# Patient Record
Sex: Female | Born: 1940 | Race: White | Hispanic: No | Smoking: Never smoker
Health system: Southern US, Community
[De-identification: ages and names within clinical notes are randomized; demographics above are authoritative.]

## PROBLEM LIST (undated history)

## (undated) DIAGNOSIS — G8929 Other chronic pain: Secondary | ICD-10-CM

## (undated) DIAGNOSIS — R0981 Nasal congestion: Secondary | ICD-10-CM

## (undated) DIAGNOSIS — K219 Gastro-esophageal reflux disease without esophagitis: Secondary | ICD-10-CM

## (undated) DIAGNOSIS — C55 Malignant neoplasm of uterus, part unspecified: Secondary | ICD-10-CM

## (undated) DIAGNOSIS — C50919 Malignant neoplasm of unspecified site of unspecified female breast: Secondary | ICD-10-CM

## (undated) DIAGNOSIS — Z972 Presence of dental prosthetic device (complete) (partial): Secondary | ICD-10-CM

## (undated) DIAGNOSIS — M779 Enthesopathy, unspecified: Secondary | ICD-10-CM

## (undated) DIAGNOSIS — K259 Gastric ulcer, unspecified as acute or chronic, without hemorrhage or perforation: Secondary | ICD-10-CM

## (undated) DIAGNOSIS — R51 Headache: Secondary | ICD-10-CM

## (undated) DIAGNOSIS — M549 Dorsalgia, unspecified: Secondary | ICD-10-CM

## (undated) HISTORY — PX: OTHER SURGICAL HISTORY: SHX169

## (undated) HISTORY — DX: Gastric ulcer, unspecified as acute or chronic, without hemorrhage or perforation: K25.9

## (undated) HISTORY — DX: Malignant neoplasm of uterus, part unspecified: C55

## (undated) HISTORY — DX: Dorsalgia, unspecified: M54.9

## (undated) HISTORY — DX: Gastro-esophageal reflux disease without esophagitis: K21.9

## (undated) HISTORY — PX: ABDOMINAL HYSTERECTOMY: SHX81

## (undated) HISTORY — DX: Other chronic pain: G89.29

## (undated) HISTORY — DX: Enthesopathy, unspecified: M77.9

## (undated) HISTORY — DX: Malignant neoplasm of unspecified site of unspecified female breast: C50.919

---

## 1962-06-23 HISTORY — PX: TONSILLECTOMY: SUR1361

## 1972-06-23 HISTORY — PX: OTHER SURGICAL HISTORY: SHX169

## 1996-06-23 DIAGNOSIS — C55 Malignant neoplasm of uterus, part unspecified: Secondary | ICD-10-CM

## 1996-06-23 HISTORY — PX: OTHER SURGICAL HISTORY: SHX169

## 1996-06-23 HISTORY — DX: Malignant neoplasm of uterus, part unspecified: C55

## 1999-11-22 HISTORY — PX: CHOLECYSTECTOMY: SHX55

## 2001-04-19 ENCOUNTER — Ambulatory Visit (HOSPITAL_COMMUNITY): Admission: RE | Admit: 2001-04-19 | Discharge: 2001-04-19 | Payer: Self-pay | Admitting: General Surgery

## 2001-04-21 ENCOUNTER — Encounter: Payer: Self-pay | Admitting: Obstetrics and Gynecology

## 2001-04-21 ENCOUNTER — Ambulatory Visit (HOSPITAL_COMMUNITY): Admission: RE | Admit: 2001-04-21 | Discharge: 2001-04-21 | Payer: Self-pay | Admitting: Obstetrics and Gynecology

## 2002-02-11 ENCOUNTER — Ambulatory Visit (HOSPITAL_COMMUNITY): Admission: RE | Admit: 2002-02-11 | Discharge: 2002-02-11 | Payer: Self-pay | Admitting: Obstetrics and Gynecology

## 2002-02-11 ENCOUNTER — Encounter: Payer: Self-pay | Admitting: Obstetrics and Gynecology

## 2002-04-21 ENCOUNTER — Encounter: Payer: Self-pay | Admitting: Obstetrics and Gynecology

## 2002-04-21 ENCOUNTER — Ambulatory Visit (HOSPITAL_COMMUNITY): Admission: RE | Admit: 2002-04-21 | Discharge: 2002-04-21 | Payer: Self-pay | Admitting: Obstetrics and Gynecology

## 2002-06-03 ENCOUNTER — Encounter: Payer: Self-pay | Admitting: Neurosurgery

## 2002-06-03 ENCOUNTER — Inpatient Hospital Stay (HOSPITAL_COMMUNITY): Admission: RE | Admit: 2002-06-03 | Discharge: 2002-06-09 | Payer: Self-pay | Admitting: Neurosurgery

## 2002-06-07 ENCOUNTER — Encounter: Payer: Self-pay | Admitting: Neurosurgery

## 2002-09-26 ENCOUNTER — Encounter (HOSPITAL_COMMUNITY): Admission: RE | Admit: 2002-09-26 | Discharge: 2002-10-26 | Payer: Self-pay | Admitting: Neurosurgery

## 2002-12-06 ENCOUNTER — Encounter (HOSPITAL_COMMUNITY): Admission: RE | Admit: 2002-12-06 | Discharge: 2003-01-05 | Payer: Self-pay | Admitting: Neurosurgery

## 2003-01-06 ENCOUNTER — Encounter (HOSPITAL_COMMUNITY): Admission: RE | Admit: 2003-01-06 | Discharge: 2003-02-05 | Payer: Self-pay | Admitting: Neurosurgery

## 2003-07-20 ENCOUNTER — Ambulatory Visit (HOSPITAL_COMMUNITY): Admission: RE | Admit: 2003-07-20 | Discharge: 2003-07-20 | Payer: Self-pay | Admitting: Obstetrics and Gynecology

## 2004-12-02 ENCOUNTER — Ambulatory Visit (HOSPITAL_COMMUNITY): Admission: RE | Admit: 2004-12-02 | Discharge: 2004-12-02 | Payer: Self-pay | Admitting: Obstetrics and Gynecology

## 2005-03-04 HISTORY — PX: ABSCESS DRAINAGE: SHX1119

## 2005-03-08 ENCOUNTER — Emergency Department (HOSPITAL_COMMUNITY): Admission: EM | Admit: 2005-03-08 | Discharge: 2005-03-08 | Payer: Self-pay | Admitting: Emergency Medicine

## 2005-03-09 ENCOUNTER — Inpatient Hospital Stay (HOSPITAL_COMMUNITY): Admission: EM | Admit: 2005-03-09 | Discharge: 2005-03-12 | Payer: Self-pay | Admitting: Emergency Medicine

## 2005-12-01 ENCOUNTER — Ambulatory Visit (HOSPITAL_COMMUNITY): Admission: RE | Admit: 2005-12-01 | Discharge: 2005-12-01 | Payer: Self-pay | Admitting: Internal Medicine

## 2005-12-23 ENCOUNTER — Ambulatory Visit (HOSPITAL_COMMUNITY): Admission: RE | Admit: 2005-12-23 | Discharge: 2005-12-23 | Payer: Self-pay | Admitting: Internal Medicine

## 2006-01-05 ENCOUNTER — Ambulatory Visit: Payer: Self-pay | Admitting: Gastroenterology

## 2006-01-21 ENCOUNTER — Ambulatory Visit (HOSPITAL_COMMUNITY): Admission: RE | Admit: 2006-01-21 | Discharge: 2006-01-21 | Payer: Self-pay | Admitting: Gastroenterology

## 2006-01-21 ENCOUNTER — Encounter (INDEPENDENT_AMBULATORY_CARE_PROVIDER_SITE_OTHER): Payer: Self-pay | Admitting: Specialist

## 2006-01-21 ENCOUNTER — Ambulatory Visit: Payer: Self-pay | Admitting: Gastroenterology

## 2006-01-21 DIAGNOSIS — K219 Gastro-esophageal reflux disease without esophagitis: Secondary | ICD-10-CM

## 2006-01-21 HISTORY — DX: Gastro-esophageal reflux disease without esophagitis: K21.9

## 2006-01-21 HISTORY — PX: ESOPHAGOGASTRODUODENOSCOPY: SHX1529

## 2006-02-26 ENCOUNTER — Ambulatory Visit: Payer: Self-pay | Admitting: Gastroenterology

## 2006-04-28 ENCOUNTER — Ambulatory Visit: Payer: Self-pay | Admitting: Gastroenterology

## 2007-01-18 ENCOUNTER — Ambulatory Visit (HOSPITAL_COMMUNITY): Admission: RE | Admit: 2007-01-18 | Discharge: 2007-01-18 | Payer: Self-pay | Admitting: Gynecology

## 2007-06-08 ENCOUNTER — Ambulatory Visit: Payer: Self-pay | Admitting: Gastroenterology

## 2007-07-21 ENCOUNTER — Ambulatory Visit: Payer: Self-pay | Admitting: Gastroenterology

## 2007-11-30 ENCOUNTER — Ambulatory Visit: Payer: Self-pay | Admitting: Gastroenterology

## 2008-02-16 ENCOUNTER — Ambulatory Visit (HOSPITAL_COMMUNITY): Admission: RE | Admit: 2008-02-16 | Discharge: 2008-02-16 | Payer: Self-pay | Admitting: Internal Medicine

## 2008-06-06 ENCOUNTER — Ambulatory Visit: Payer: Self-pay | Admitting: Gastroenterology

## 2009-01-09 ENCOUNTER — Other Ambulatory Visit: Admission: RE | Admit: 2009-01-09 | Discharge: 2009-01-09 | Payer: Self-pay | Admitting: Obstetrics and Gynecology

## 2009-01-23 DIAGNOSIS — K219 Gastro-esophageal reflux disease without esophagitis: Secondary | ICD-10-CM | POA: Insufficient documentation

## 2009-01-23 DIAGNOSIS — K2970 Gastritis, unspecified, without bleeding: Secondary | ICD-10-CM | POA: Insufficient documentation

## 2009-01-23 DIAGNOSIS — R197 Diarrhea, unspecified: Secondary | ICD-10-CM | POA: Insufficient documentation

## 2009-01-23 DIAGNOSIS — T7840XA Allergy, unspecified, initial encounter: Secondary | ICD-10-CM | POA: Insufficient documentation

## 2009-01-23 DIAGNOSIS — K2990 Gastroduodenitis, unspecified, without bleeding: Secondary | ICD-10-CM

## 2009-01-23 DIAGNOSIS — G43909 Migraine, unspecified, not intractable, without status migrainosus: Secondary | ICD-10-CM | POA: Insufficient documentation

## 2009-01-23 DIAGNOSIS — Z8542 Personal history of malignant neoplasm of other parts of uterus: Secondary | ICD-10-CM | POA: Insufficient documentation

## 2009-05-14 ENCOUNTER — Ambulatory Visit (HOSPITAL_COMMUNITY): Admission: RE | Admit: 2009-05-14 | Discharge: 2009-05-14 | Payer: Self-pay | Admitting: Internal Medicine

## 2009-09-17 ENCOUNTER — Telehealth (INDEPENDENT_AMBULATORY_CARE_PROVIDER_SITE_OTHER): Payer: Self-pay

## 2009-10-03 ENCOUNTER — Ambulatory Visit (HOSPITAL_COMMUNITY)
Admission: RE | Admit: 2009-10-03 | Discharge: 2009-10-03 | Payer: Self-pay | Source: Home / Self Care | Admitting: Internal Medicine

## 2010-02-22 ENCOUNTER — Emergency Department (HOSPITAL_COMMUNITY): Admission: EM | Admit: 2010-02-22 | Discharge: 2010-02-22 | Payer: Self-pay | Admitting: Emergency Medicine

## 2010-02-26 ENCOUNTER — Emergency Department (HOSPITAL_COMMUNITY): Admission: EM | Admit: 2010-02-26 | Discharge: 2010-02-26 | Payer: Self-pay | Admitting: Emergency Medicine

## 2010-05-27 ENCOUNTER — Ambulatory Visit (HOSPITAL_COMMUNITY)
Admission: RE | Admit: 2010-05-27 | Discharge: 2010-05-27 | Payer: Self-pay | Source: Home / Self Care | Admitting: Internal Medicine

## 2010-07-13 ENCOUNTER — Encounter: Payer: Self-pay | Admitting: Obstetrics and Gynecology

## 2010-07-23 NOTE — Progress Notes (Signed)
Summary: omeppazole refill  Phone Note Call from Patient Call back at Home Phone [phone]   Caller: Patient Summary of Call: pt called- left message- she needs refill on omeprazole written for 3 month supplys and faxed to mail order. [phone] is fax #. [phone] is pt ID # Initial call taken by: Hendricks Limes LPN,  September 17, 2009 10:28 AM     Appended Document: omeppazole refill    Prescriptions: OMEPRAZOLE 20 MG TBEC (OMEPRAZOLE) once daily  #90 x 1   Entered and Authorized by:   Joselyn Arrow FNP-BC   Signed by:   Joselyn Arrow FNP-BC on 09/17/2009   Method used:   Printed then faxed to ...         RxID:   [id number]

## 2010-10-22 ENCOUNTER — Encounter (HOSPITAL_COMMUNITY): Payer: Medicare Other

## 2010-10-22 ENCOUNTER — Other Ambulatory Visit: Payer: Self-pay | Admitting: Ophthalmology

## 2010-10-22 LAB — BASIC METABOLIC PANEL
Calcium: 9.6 mg/dL (ref 8.4–10.5)
GFR calc Af Amer: >60 mL/min (ref >60)
GFR calc non Af Amer: >60 mL/min (ref >60)
Potassium: 4.5 mEq/L (ref 3.5–5.1)
Sodium: 138 mEq/L (ref 135–145)

## 2010-10-22 LAB — HEMOGLOBIN AND HEMATOCRIT, BLOOD
HCT: 42 % (ref 36.0–46.0)
Hemoglobin: 14.2 g/dL (ref 12.0–15.0)

## 2010-10-28 ENCOUNTER — Ambulatory Visit (HOSPITAL_COMMUNITY)
Admission: RE | Admit: 2010-10-28 | Discharge: 2010-10-28 | Disposition: A | Discharge status: Home / Self Care | Payer: Medicare Other | Source: Ambulatory Visit | Attending: Ophthalmology | Admitting: Ophthalmology

## 2010-10-28 DIAGNOSIS — Z01818 Encounter for other preprocedural examination: Secondary | ICD-10-CM | POA: Insufficient documentation

## 2010-10-28 DIAGNOSIS — H2510 Age-related nuclear cataract, unspecified eye: Secondary | ICD-10-CM | POA: Insufficient documentation

## 2010-11-05 NOTE — Assessment & Plan Note (Signed)
Kayla Thomas, Kayla Thomas            CHART#:  [id number]   DATE:  06/06/2008                       DOB:  05-15-1941   REFERRING PHYSICIAN:  Kingsley Callander. Ouida Sills, MD   PROBLEM LIST:  1. 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  2. Mild gastritis.  3. Normal colonoscopy in 2002.  4. Allergies.  5. Migraines.  6. Hysterectomy.   SUBJECTIVE:  The patient is a 70 year old female who presents as a  return patient visit.  She was last seen in June 2009.  She still  continued to use Imodium 3 times a week.  She still continues to forget  to take her hyoscyamine.  She does not take the cholestyramine because  it gives a gas.  Since she switched to a vitamin without iron, the  cramping in her right side has gone.  She has just recovered from  respiratory illness over the last 3 weeks.  She wants a 37-month supply  of her medications.   ALLERGIES:  Aspirin and iron.   MEDICATIONS:  1. Xanax as needed.  2. Omeprazole 20 mg daily.  3. Imodium as needed.  4. Bilberry daily.  5. Tylenol Arthritis.  6. Hyoscyamine as needed.  7. Tylenol with Sinus.  8. Tums 2 daily.  9. FiberChoice once a day.  10.Multivitamins.   OBJECTIVE:  VITAL SIGNS:  Weight 191 pounds, height 5 feet 3 inches, BMI  33.8 (obese), temperature 98.1, blood pressure 118/74, and pulse  76.GENERAL:  She is in no apparent distress.  Alert and oriented  x4.HEENT:  Atraumatic, normocephalic.  Pupils equal and react to light.  Mouth, no oral lesions.  Posterior pharynx without erythema or  exudate.LUNGS:  Clear to auscultation bilaterally.CARDIOVASCULAR:  Regular rhythm.  No murmur.  Normal S1 and S2.ABDOMEN:  Bowel sounds are  present, soft, nontender, nondistended.  No rebound or guarding.   ASSESSMENT:  The patient is a 70 year old female with intermittent loose  stools likely secondary to functional gut disorder with bile salt  component.  She responds fairly well to Levsin and  cholestyramine when  she takes it.  Thank you for allowing me to see the patient in  consultation.  My recommendations follow.   RECOMMENDATIONS:  1. Will change her to WelChol 3 p.o. b.i.d. instead of cholestyramine.  2. I asked her to use the Levsin 1 to 2 times a day and 1 to 2 times      at bedtime.  She is given a 30-month supply.  She is also given      omeprazole 76-month supply of the year's refill.  3. She has a follow up appointment to see me in 4 months.       Kassie Mends, M.D.  Electronically Signed     SM/MEDQ  D:  06/06/2008  T:  06/07/2008  Job:  [id number]   cc:   Kingsley Callander. Ouida Sills, MD

## 2010-11-05 NOTE — Assessment & Plan Note (Signed)
Kayla Thomas, Kayla Thomas            CHART#:  [id number]   DATE:  07/21/2007                       DOB:  [birth year]   REFERRING PHYSICIAN:  Carylon Perches.   PROBLEM LIST:  1. Intermittent loose stools, likely secondary to functional gut      disorder with bile-salt component.  2. Mild chronic gastritis.  3. Normal colonoscopy in 2002.  4. Allergies.  5. Migraines.  6. Hysterectomy.   SUBJECTIVE:  The patient is a 70 year old female who presents as a  return patient visit.  She was last seen in December 2008.  She was  asked to begin cholestyramine and use Levsin.  She said cholestyramine  daily gave her gas.  She now takes it every 3 days.  She is having  pretty normal stools.  She has only had to use Imodium once because  she felt sick.  Sometimes she has right upper quadrant pain that is a  real bad cramping.  She often forgets to take her Levsin.  She has not  changed her diet.  She does eat popcorn at nighttime.  She thinks the  cholestyramine has made a difference.  She has an appointment in  February with Dr. Ouida Sills.   MEDICATIONS:  1. Xanax at night 2 to 3 times a week.  2. Omeprazole 20 mg daily.  3. Loratadine.  4. Excedrin Migraine as needed.  5. Hyoscyamine as needed.  6. Tums 2 daily.  7. Fiber Choice once a day.  8. Skelaxin 800 mg nightly.  9. Arthrotek (diclofenac/misoprostol) 50 mg twice a day for neck pain.   OBJECTIVE:  Weight 189 pounds (up 3 pounds since December 2008), height  5 feet 3 inches, temperature 98.1, blood pressure 104/70, pulse  60.GENERAL:  She is in no apparent distress, alert and oriented  x4.LUNGS:  Clear to auscultation bilaterally. CARDIOVASCULAR EXAM:  Regular rhythm.ABDOMEN:  Bowel sounds are present, soft, nontender,  nondistended.   ASSESSMENT:  The patient is a 70 year old female who has intermittent  loose stools, likely secondary to bile-salt induced diarrhea with a  functional gut component as well.  She is symptomatically  improved with  cholestyramine.  Thank you for allowing me to see the patient in  consultation.  My recommendations follow.   RECOMMENDATIONS:  1. She may continue to use cholestyramine every 3 days.  2. She should use the Levsin more regularly to prevent her right upper      quadrant cramping.  She may use Imodium as needed.  3. Will have the labs that are being drawn by Dr. Ouida Sills on today faxed      to me once the results are known.  4. She has a follow up appointment to see me in 4 months.  She should      continue to take her omeprazole 20 mg a day.       Kassie Mends, M.D.  Electronically Signed     SM/MEDQ  D:  07/21/2007  T:  07/21/2007  Job:  [id number]   cc:   Kingsley Callander. Ouida Sills, MD

## 2010-11-05 NOTE — Assessment & Plan Note (Signed)
NAMEKHAMILLE, BEYNON            CHART#:  [id number]   DATE:  06/08/2007                       DOB:  November 22, 1940   REFERRING PHYSICIAN:  Kingsley Callander. Ouida Sills, MD   PROBLEM LIST:  1. Intermittent constipation and diarrhea likely secondary to      functional gut disorder.  2. Mild chronic gastritis.  3. Normal colonoscopy in 2002.  4. Allergies.  5. Migraines.  6. Hysterectomy.   SUBJECTIVE:  Ms. Orihuela is a 70 year old female who was last seen in  November 2007.  She still takes Imodium 4-5 times a week because of  loose stool.  The Levsin seems to help.  She usually has stools that  start off loose and then become watery 2-3 times a week.  The Imodium  does not help right away, but it does stop the diarrhea eventually.  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  Sometimes she has formed  stool and then it became watery.  Sometimes it is hard to go.  She  denies any bloating, nausea or vomiting.  She does not have any crampy  abdominal pain.  Her gallbladder came out in 2001.  Stools do not really  vary with what she eats.  Her stress level is pretty high because of  issues with her son and losing her husband.  She feels a little bit  depressed.  Occasionally, she has milk.  Occasionally, she has cheese.  She denies eating fried foods.   ALLERGIES:  ASPIRIN.   MEDICATIONS:  1. Xanax.  2. Omeprazole.  3. Imodium as needed.  4. Excedrin migraine as needed.  5. Tylenol as needed.  6. Hycosamine 30 minutes before meals.  7. Two Tums daily.  8. FiberChoice 1-2 times a day.   OBJECTIVE:  VITAL SIGNS:  Weight 186 pounds (down 8 pounds since July  2007), height 5 feet 3 inches, BMP 32.9 (obese), temperature 98.3, blood  pressure 122/78, pulse 72.  GENERAL:  She is in no apparent distress, alert and oriented x4.  LUNGS:  Clear to auscultation bilaterally.  CARDIOVASCULAR:  Regular rhythm.  No murmur.  Normal S1 and S2.  ABDOMEN:  Bowel sounds are present, soft, nontender  and non-distended.  No rebound or guarding.   ASSESSMENT:  Ms. Dever is a 70 year old female who likely has a  functional gut disorder.  Differential diagnosis includes bile-salt-  induced diarrhea and the low likelihood of small bowel bacterial  overgrowth.   Thank you for allowing me to see Ms. Vallee in consultation.  My  recommendations follow:   RECOMMENDATIONS:  1. She is asked to use cholestyramine 4 g once a day.  She is given      refills.  2. She is given a 19-month supply/prescription for Levsin and      omeprazole with 5 refills.  3. I would consider hydrogen breath test or flexible sigmoidoscopy to      evaluate for microscopic colitis if her symptoms do not improve on      cholestyramine.  4. She has a follow up appointment to see me in 6 weeks.       Kassie Mends, M.D.  Electronically Signed     SM/MEDQ  D:  06/08/2007  T:  06/09/2007  Job:  [id number]   cc:   Kingsley Callander. Ouida Sills, MD

## 2010-11-05 NOTE — Assessment & Plan Note (Signed)
NAMEWILBERTA, Kayla Thomas            CHART#:  [id number]   DATE:  11/30/2007                       DOB:  12-14-1940   REFERRING PHYSICIAN:  Kingsley Callander. Ouida Sills, MD   PROBLEM LIST:  1. Intermittent loose stools likely secondary to functional gut      disorder with bile salt diarrhea.  2. Mild chronic gastritis.  3. Normal colonoscopy in 2002.  4. Allergies.  5. Migraines.  6. Hysterectomy.   SUBJECTIVE:  Kayla Thomas is a 70 year old female who presents as a  return patient visit.  She was last seen in January 2009.  She continues  to use the cholestyramine but thinks this is causing her to have gas.  She continues to use the hyoscyamine 1 to 2 a day.  Her bowel movements  are okay.  Occasionally, she has diarrhea in the morning.  She is now  using Imodium once a week, where as she was using it once a day.  She  has been trying to get enough fiber in and she has been getting  exercise.  She has been outside in her flower beds in her garden.   MEDICATIONS:  1. Xanax.  2. Omeprazole.  3. Imodium.  4. ICaps.  5. Bilberry.  6. Excedrin as needed.  7. Tylenol.  8. Hyoscyamine.  9. Tylenol as needed.  10.Tums 2 daily.  11.FiberChoice daily.   OBJECTIVE:  VITAL SIGNS:  Weight 186 pounds (down 3 pounds since January  2009), height 5 feet 3 inches, temperature 98.1, blood pressure 118/80,  pulse 64.  GENERAL:  She is in no apparent distress.  Alert and oriented x4.LUNGS:  Clear to auscultation bilaterally.CARDIOVASCULAR:  Regular  rhythm.ABDOMEN:  Bowel sounds are present, soft, nontender, and  nondistended.   ASSESSMENT:  Kayla Thomas is a 70 year old female whose diarrhea has  responded very well to cholestyramine.  She thinks 1 scoop every 3 days  might be causing her to have gas.  She has a minimal requirement for  Imodium and continues to use the hyoscyamine. Thank you for allowing me  to see Kayla Thomas in consultation.  My recommendations follow.   RECOMMENDATIONS:  1.  Kayla Thomas may titrate her cholestyramine.  I suggested using      half a scoop every 3 days to see if she can still achieve no      diarrhea without the gas.  2. She should continue the HyoMax.  3. She has a follow up appointment to see me in 6 months.  If she      thinks she needs to be seen sooner, then we will get her an      appointment.       Kassie Mends, M.D.  Electronically Signed     SM/MEDQ  D:  11/30/2007  T:  12/01/2007  Job:  [id number]   cc:   Kingsley Callander. Ouida Sills, MD

## 2010-11-06 ENCOUNTER — Ambulatory Visit (HOSPITAL_COMMUNITY)
Admission: RE | Admit: 2010-11-06 | Discharge: 2010-11-06 | Disposition: A | Discharge status: Home / Self Care | Payer: Medicare Other | Source: Ambulatory Visit | Attending: Internal Medicine | Admitting: Internal Medicine

## 2010-11-06 ENCOUNTER — Other Ambulatory Visit (HOSPITAL_COMMUNITY): Payer: Self-pay | Admitting: Internal Medicine

## 2010-11-06 DIAGNOSIS — M25559 Pain in unspecified hip: Secondary | ICD-10-CM | POA: Insufficient documentation

## 2010-11-06 DIAGNOSIS — M25552 Pain in left hip: Secondary | ICD-10-CM

## 2010-11-08 NOTE — Discharge Summary (Signed)
NAMEEARLIE, Kayla Thomas NO.:  [id number]   MEDICAL RECORD NO.:  [id number]          PATIENT TYPE:  INP   LOCATION:  A312                          FACILITY:  [facility]   PHYSICIAN:  Vania Rea, M.D. DATE OF BIRTH:  04-22-1941   DATE OF ADMISSION:  03/09/2005  DATE OF DISCHARGE:  09/20/2006LH                                 DISCHARGE SUMMARY   PRIMARY CARE PHYSICIAN:  Unassigned.  Assigned to Dr. Ouida Sills.   SURGEON:  Dr. Lovell Sheehan.   DISCHARGE DIAGNOSES:  1.  Cellulitis of the right breast with small abscess formation.  2.  Status post drainage of abscess by Dr. Lovell Sheehan.  3.  History of gastroesophageal reflux disease.  4.  History of chronic back pains.   DISPOSITION:  Discharge to home.   DISCHARGE CONDITION:  Stable.   DISCHARGE MEDICATIONS:  1.  Cleocin 300 mg four times daily.  2.  Bactrim double strength one tablet twice daily.  Both for 10 days.  3.  Flora-Q one daily for 10 days.  4.  Prilosec 20 mg daily.  5.  Xanax 0.5 to 1 mg when necessary.  6.  Vicodin when necessary (these are patient's outpatient medications).   HOSPITAL COURSE:  Please refer to admission history and physical.  This is a  70 year old Caucasian lady admitted with a cellulitis and abscess of the  right breast treated with intravenous Ancef and Cleocin, improved gradually.  Was seen by Dr. Lovell Sheehan and ultrasound of the right breast revealed a small  fluid collection which was aspirated and patient continued to improve on IV  antibiotics.  Today, she was seen, evaluated and considered sufficiently  stable for discharge home on oral antibiotics.  To follow up with Dr.  Lovell Sheehan on September 26.      Vania Rea, M.D.  Electronically Signed     LC/MEDQ  D:  03/12/2005  T:  03/12/2005  Job:  [id number]

## 2010-11-08 NOTE — Discharge Summary (Signed)
   NAME:  [name], [name] NO.:  [id number]   MEDICAL RECORD NO.:  [id number]                   PATIENT TYPE:  INP   LOCATION:  3004                                 FACILITY:  [facility]   PHYSICIAN:  Danae Orleans. Venetia Maxon, M.D.               DATE OF BIRTH:  04-05-1941   DATE OF ADMISSION:  06/03/2002  DATE OF DISCHARGE:  06/09/2002                                 DISCHARGE SUMMARY   REASON FOR ADMISSION:  1. Lumbar spondylolisthesis.  2. Lumbar disk degeneration.  3. Bronchitis.  4. Postoperative fever with bronchitis.   The patient was admitted and underwent lumbar fusion for spondylolisthesis  and spinal stenosis.   FINAL DIAGNOSES:  1. Lumbar spondylolisthesis.  2. Lumbar disk degeneration.  3. Bronchitis.  4. Postoperative fever with bronchitis.   HISTORY OF ILLNESS/HOSPITAL COURSE:  [name] is a 70 year old  woman with spondylolisthesis L4-L5 with spinal stenosis, degenerative disk  disease, and lumbar radiculopathy.  She was admitted on the same day of the  procedure and underwent decompressive laminectomy at L4-5 with transverse  lumbar interbody fusion at the L4-5 level with posterolateral arthrodesis  and pedicle screw fixation at L4-L5 levels.  Postoperatively, she did well.  She had a temperature immediately following surgery to 102.5 and was  mobilized and was afebrile on December 15.  The dressing was clean, dry, and  intact.  The pain was diminishing.  She was mobilized in the brace on  December 15, and was doing better on December 16, although she had a fever  to 101.9.  She was felt, on fever workup which was negative, to have fever  as the bases of atelectasis and she had peribronchial thickening consistent  with bronchitis.  Her PC and urinalysis were okay.  She was started on  Levaquin, continued on physical therapy, and was doing better on December  18, and was discharged home at that point.   DISCHARGE INSTRUCTIONS:  The  patient was to follow up in my office in three  weeks with lumbar spine radiographs.   DISCHARGE MEDICATIONS:  The patient was discharged home on:  1. Percocet 5/325 one or two every 4-6 hours as needed for pain.  2. Valium 5 mg every 4-6 hours as needed for spasm.  3. Levaquin 400 mg daily for 10 days.   ADDITIONAL INSTRUCTIONS:  No lifting, bending, twisting, or driving.  Okay  to shower, not soak her incision.  Follow up with Dr. Venetia Maxon in 3-4 weeks for  lumbar x-rays.                                               Danae Orleans. Venetia Maxon, M.D.   JDS/MEDQ  D:  07/22/2002  T:  07/22/2002  Job:  [id number]

## 2010-11-08 NOTE — Op Note (Signed)
NAME:  Kayla Thomas, Kayla Thomas NO.:  [id number]   MEDICAL RECORD NO.:  [id number]                   PATIENT TYPE:  INP   LOCATION:  3004                                 FACILITY:  [facility]   PHYSICIAN:  Danae Orleans. Venetia Maxon, M.D.               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   POSTOPERATIVE DIAGNOSIS:  Spondylolisthesis L4 and L5 with lumbar spinal  stenosis L4-5 with degenerative disk disease and lumbar radiculopathy.   PROCEDURE:  Decompressive laminectomy, L4 through L5 with transverse lumbar  interbody fusion, L4-5 level with posterolateral arthrodesis, L4 through L5  with morselized autograft and Vitoss bone graft supplement with pedicle  screw fixation L4 through L5 bilaterally and postoperative ________.   SURGEON:  Danae Orleans. Venetia Maxon, M.D.   ASSISTANT:  Stefani Dama, M.D.   ANESTHESIA:  General endotracheal anesthesia.   ESTIMATED BLOOD LOSS:  150 cc with 300 cc of Cellsaver blood returned to the  patient.   COMPLICATIONS:  None, disposition to the recovery room.   INDICATIONS:  The patient is a 70 year old retired woman with mobile  spondylolisthesis of L4 and L5 with marked back and right lower extremity  pain. It was elected to  take her to surgery for decompression and fusion at  this level.   DESCRIPTION OF PROCEDURE:  The patient was brought to the operating room.  Following the satisfactory and uncomplicated induction of general  endotracheal anesthesia and placement of a Foley catheter she was turned in  the prone position on the operating table and chest rolls. Her lower back  was prepped and draped in the usual sterile fashion. The area of planned  incision was infiltrated with 0.25% Marcaine and  0.5% Lidocaine and  1:200,000 epinephrine.   An incision was made in the midline and carried through the subcutaneous and  adipose tissue to the lumbodorsal fascia which was incised bilaterally.  Subperiosteal dissection was performed exposing the L4 and L5 transverse  processes bilaterally. The Versatrac retractor was placed to facilitate the  exposure. Intraoperative plain x-ray confirmed correct level with marker  probes on L4 and L5 transverse processes.   The facet joints of L4-5 were extremely degenerated  and these were removed  with the Leksell rongeur and Anspach drill with _____  bur. A total  laminectomy of L4 was performed with the preservation of the spinous process  and medial aspect of the pars. The thecal  sac was decompressed bilaterally  and the superior facet of  L5 was also removed.   There was an incompetent annulus identified with bulging disk and a  contained disk herniation on the right. The disk  space was  incised with a  #15 blade using  the Derrico nerve root retractor to protect the common dural  tube. The L4 nerve roots were decompressed bilaterally and an extensive  diskectomy was performed bilaterally.   Subsequently the endplates were prepared for grafting using the Synthes to  the facet.  A disk space spreader was placed in the interspace at the L4-5  level on the left and an 11 mm graft was sized and found to fit  appropriately, and its position and trajectory were confirmed under  fluoroscopic visualization. Subsequently an 11 bone graft was inserted and  countersunk appropriately. Morselized bone autograft was placed overlying  this graft on both sides and tamped into position. Intraoperative  fluoroscopy confirmed its position.   Subsequently pedicle screws were placed at L4 and L5 bilaterally and their  positioning was confirmed on lateral and AP fluoroscopy. The left-sided L5  screw appeared to be lateral to the pedicle on AP view and this was   redirected medially;  5.75 x 40 mm screws were used at all levels with the exception of this  redirected screw,  a 6.75 mm screw was used. There does not appear to be any  bony cutouts on the pedicle screws.   The 40 mm rods were attached to the ST-90 screws, and the locking caps were  engaged. Morselized bone autograft was mixed with the drillings from the  bone and 10 cc of Vitoss and this was inserted overlying the decorticated  transverse processes of L4 and L5 bilaterally. Hemostasis was assured.   The self-retaining retractor was removed. The lumbo dorsal fascia was closed  with 1 Vicryl sutures, the subcutaneous tissue were reapproximated with 2-0  Vicryl interrupted inverted sutures and the skin edges were reapproximated  with interrupted 3-0 Vicryl subcutaneous stitch. The wound was dressed with  Benzoin and Steri-Strips. Telfa gauze was placed.   The patient was extubated in the operating room and taken  to the recovery  room in stable and satisfactory condition, having  tolerated his operation  well. Counts were correct at the end of the case.                                               Danae Orleans. Venetia Maxon, M.D.    JDS/MEDQ  D:  06/03/2002  T:  06/04/2002  Job:  [id number]

## 2010-11-08 NOTE — Op Note (Signed)
Kayla Thomas, Kayla Thomas           ACCOUNT NO.:  [id number]   MEDICAL RECORD NO.:  [id number]          PATIENT TYPE:  AMB   LOCATION:  DAY                           FACILITY:  [facility]   PHYSICIAN:  Kassie Mends, M.D.      DATE OF BIRTH:  [birth year]   DATE OF PROCEDURE:  01/21/2006  DATE OF DISCHARGE:                                 OPERATIVE REPORT   PROCEDURE:  Esophagogastroduodenoscopy with cold forceps biopsies.   MEDICATIONS:  1. Demerol 75 mg IV.  2. Versed 6 mg IV.   INDICATIONS FOR EXAM:  Kayla Thomas is a 70 year old female with left upper  quadrant abdominal pain and equivocal H. pylori serology.   FINDINGS:  1. Mild antral erythema.  Biopsies obtained for H. pylori.  2. Normal esophagus without evidence of inflammation, mass or Barrett's      esophagus.  3. Normal pylorus and duodenum.   RECOMMENDATIONS:  1. Follow up biopsies.  2. The patient has a follow-up appointment to see me.  She may also follow      up with Dr. Ouida Sills.   PROCEDURE TECHNIQUE:  Physical exam was performed, informed consent was  obtained from the patient after explaining all risks, benefits and  alternatives to the procedure which the patient appeared to understand and  so stated.  The patient was connected to the monitoring device and placed in  the left lateral position.  Continuous oxygen was provided by nasal cannula  and IV medicine administered through an indwelling cannula.  After  administration of sedation, the patient's esophagus was intubated and the  scope was advanced under direct  visualization to the second portion of the duodenum.  The scope was  subsequently removed slowly by carefully examining the color, texture,  anatomy and integrity of the mucosa on the way out.  The patient was  recovered in the endoscopy suite and discharged home in satisfactory  condition.      Kassie Mends, M.D.  Electronically Signed     SM/MEDQ  D:  01/22/2006  T:  01/22/2006  Job:  [id number]   cc:   Kingsley Callander. Ouida Sills, MD  Fax: [phone]

## 2010-11-08 NOTE — Consult Note (Signed)
NAMELEONARDA, Kayla Thomas           ACCOUNT NO.:  [id number]   MEDICAL RECORD NO.:  [id number]          PATIENT TYPE:  AMB   LOCATION:                                FACILITY:  [facility]   PHYSICIAN:  Kassie Mends, M.D.      DATE OF BIRTH:  [birth year]   DATE OF CONSULTATION:  04/28/2006  DATE OF DISCHARGE:                                   CONSULTATION   CHIEF COMPLAINT:  One month follow-up.   SUBJECTIVE:  Ms. Towle is a 70 year old Caucasian female with right  upper quadrant pain as well as left upper quadrant abdominal pain.  She has  been followed by Dr. Cira Servant.  She had an EGD in August 2007, which showed  mild antral erythema and biopsies revealed mild chronic gastritis without  evidence of H. pylori.  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  She  has been taking hyoscyamine 0.125 mg before meals.  She says that this does  seem to help a lot.  She has rare episodes of abdominal pain at this point.  She continues to have diarrhea, especially first thing in the morning.  She  can have 2-4 loose stools.  She does take fiber daily when she can remember.  She is having some breakthrough heartburn at times but for the most part,  her symptoms are well controlled on Omeprazole 20 mg daily.   CURRENT MEDICATIONS:  See the list from April 28, 2006.   ALLERGIES:  ASPIRIN and SEVERAL ANTIBIOTICS.   OBJECTIVE:  GENERAL APPEARANCE:  Ms. Hyun is a well-developed, well-  nourished 70 year old Caucasian female in no acute distress.  VITAL SIGNS:  Weight 190.5 pounds, height 62 inches, temperature 98.2, blood pressure  120/82, pulse 76.  HEENT:  Sclerae are clear and nonicteric.  Conjunctivae are pink.  Oropharynx pink and moist without any lesions.  CARDIOVASCULAR:  Heart regular rate and rhythm with normal S1 and S2.  ABDOMEN:  Positive bowel sounds x4.  No bruits auscultated, soft, nontender,  nondistended without palpable mass or hepatosplenomegaly.  No rebound,  tenderness or  guarding.  EXTREMITIES:  Without clubbing or edema bilaterally.   ASSESSMENT:  Ms. 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  Symptoms well controlled on antispasmodic.  She also has  history of gastroesophageal reflux disease which is well controlled on  proton pump inhibitors at this time.   PLAN:  1. Hyoscyamine 0.125 mg one p.o. a.c. and I have asked her to add a      bedtime dose as well as take it first thing in      the morning four times a day which may  help with her a.m. diarrhea      #180 with three refills.  2. Next colonoscopy 2012.  3. Follow-up in one year with Dr. Cira Servant or sooner if needed.      Nicholas Lose, N.P.      Kassie Mends, M.D.  Electronically Signed    KC/MEDQ  D:  04/29/2006  T:  04/29/2006  Job:  [id number]   cc:   Kingsley Callander. Ouida Sills,  MD  Fax: [phone]

## 2010-11-08 NOTE — H&P (Signed)
Kayla Thomas, HATA NO.:  [id number]   MEDICAL RECORD NO.:  [id number]          PATIENT TYPE:  INP   LOCATION:  A312                          FACILITY:  [facility]   PHYSICIAN:  Vania Rea, M.D. DATE OF BIRTH:  1940-10-05   DATE OF ADMISSION:  03/09/2005  DATE OF DISCHARGE:  LH                                HISTORY & PHYSICAL   PRIMARY CARE PHYSICIAN:  Unassigned.   ONCOLOGIST/GYNECOLOGIST:  [name], M.D.   SURGEON:  [name], M.D.   CHIEF COMPLAINT:  Cellulitis of the right breast getting worse.   HISTORY OF PRESENT ILLNESS:  This is a 70 year old, Caucasian lady with a  personal history of uterine cancer treated with hysterectomy 7 years ago,  and a strong family history of cancer who for the past few days has been  having episodic fevers and chills and redness and inflammation of the right  breast.  She came to the emergency room yesterday and was sent home with  doxycycline, returns today with inflammation of the right breast getting  worse and the hospitalist service was contacted to assist with admission.  The patient denies headache, nausea or vomiting, abdominal pain, weight loss  or joint pains.   PAST MEDICAL HISTORY:  Significant for GERD, aspirin intolerance, back  surgery 3-1/2 years ago, status post cholecystectomy, occasional migraine  headaches, status post hysterectomy.   MEDICATIONS:  1.  Xanax 0.5 to 1 mg when necessary.  2.  Omeprazole 20 mg daily.  3.  Doxycycline 100 mg twice daily for the past 1 day.  4.  Hydrocodone and acetaminophen when necessary.   ALLERGIES AND INTOLERANCES:  Aspirin upsets her stomach.   SOCIAL HISTORY:  She denies tobacco or alcohol or illicit drug use.   FAMILY HISTORY:  Strong family history of carcinoma.   REVIEW OF SYSTEMS:  other than noted in the HPI, review of systems is  negative.   PHYSICAL EXAMINATION:  A pleasant, elderly, Caucasian lady lying in bed in  no acute  distress.  VITAL SIGNS:  Temperature is 98.5, pulse 67, respirations 24, blood pressure  121/67.  HEENT:  Pupils are round, equal, and reactive.  Mucous membranes are pink  and anicteric.  CHEST:  Is clear to auscultation.  BREASTS:  She has extensive inflammation of the right breast from below the  nipple extending up along the lower two-thirds of the breast.  It seems to  be forming an abscess in the area above the nipple.  CARDIOVASCULAR:  Regular rhythm.  ABDOMEN:  Obese, soft, and non-tender.  EXTREMITIES:  Are without edema.   LAB WORK:  Pending.   ASSESSMENT:  Cellulitis of the right breast with possible abscess formation.  Consider ca of the breast   PLAN:  Will follow up on her blood work.  Will admit her and start her on  Ancef and clindamycin.  Will get a surgical consult for possible incision  and drainage if she is not improving rapidly, may consider vancomycin.      Vania Rea, M.D.  Electronically Signed     LC/MEDQ  D:  03/09/2005  T:  03/09/2005  Job:  [id number]   cc:   [name], M.D.  Fax: [phone]   [name], M.D.  [address] Kayla Thomas  [place] [address]  Fax: [phone]

## 2010-12-03 ENCOUNTER — Other Ambulatory Visit (HOSPITAL_COMMUNITY): Payer: Self-pay | Admitting: Neurosurgery

## 2010-12-03 DIAGNOSIS — M545 Low back pain, unspecified: Secondary | ICD-10-CM

## 2010-12-05 ENCOUNTER — Other Ambulatory Visit (HOSPITAL_COMMUNITY): Payer: Medicare Other

## 2010-12-05 ENCOUNTER — Ambulatory Visit (HOSPITAL_COMMUNITY)
Admission: RE | Admit: 2010-12-05 | Discharge: 2010-12-05 | Disposition: A | Discharge status: Home / Self Care | Payer: Medicare Other | Source: Ambulatory Visit | Attending: Neurosurgery | Admitting: Neurosurgery

## 2010-12-05 DIAGNOSIS — M51379 Other intervertebral disc degeneration, lumbosacral region without mention of lumbar back pain or lower extremity pain: Secondary | ICD-10-CM | POA: Insufficient documentation

## 2010-12-05 DIAGNOSIS — M5126 Other intervertebral disc displacement, lumbar region: Secondary | ICD-10-CM | POA: Insufficient documentation

## 2010-12-05 DIAGNOSIS — M545 Low back pain, unspecified: Secondary | ICD-10-CM | POA: Insufficient documentation

## 2010-12-05 DIAGNOSIS — M5137 Other intervertebral disc degeneration, lumbosacral region: Secondary | ICD-10-CM | POA: Insufficient documentation

## 2010-12-05 DIAGNOSIS — C55 Malignant neoplasm of uterus, part unspecified: Secondary | ICD-10-CM | POA: Insufficient documentation

## 2010-12-05 MED ORDER — GADOBENATE DIMEGLUMINE 529 MG/ML IV SOLN: 20.0000 mL | Freq: Once | INTRAVENOUS | Status: AC | PRN

## 2011-03-24 ENCOUNTER — Encounter: Payer: Self-pay | Admitting: Gastroenterology

## 2011-04-22 ENCOUNTER — Ambulatory Visit (INDEPENDENT_AMBULATORY_CARE_PROVIDER_SITE_OTHER): Payer: Medicare Other | Admitting: Urgent Care

## 2011-04-22 ENCOUNTER — Encounter: Payer: Self-pay | Admitting: Urgent Care

## 2011-04-22 VITALS — BP 140/82 | HR 72 | Temp 96.1°F | Ht 63.0 in | Wt 182.8 lb

## 2011-04-22 DIAGNOSIS — Z1211 Encounter for screening for malignant neoplasm of colon: Secondary | ICD-10-CM

## 2011-04-22 DIAGNOSIS — K219 Gastro-esophageal reflux disease without esophagitis: Secondary | ICD-10-CM

## 2011-04-22 MED ORDER — OMEPRAZOLE 20 MG PO CPDR: 20.0000 mg | DELAYED_RELEASE_CAPSULE | Freq: Every day | ORAL | Status: DC

## 2011-04-22 NOTE — Progress Notes (Signed)
Reminder in epic to follow up in one year °

## 2011-04-22 NOTE — Assessment & Plan Note (Signed)
Doing well on omeprazole 20 mg daily. Refill for 90 with 3 refills for mail-order printed.

## 2011-04-22 NOTE — Progress Notes (Signed)
Cc to PCP 

## 2011-04-22 NOTE — Progress Notes (Signed)
opv in 1 year REVIEWED. AGREE.

## 2011-04-22 NOTE — Progress Notes (Signed)
Primary Care Physician:  Carylon Perches, MD Primary Gastroenterologist:  Dr. Darrick Penna  Chief Complaint  Patient presents with  . time for colonoscopy    HPI:  Kayla Thomas is a 70 y.o. female here to set up average risk screening colonoscopy.  She also has hx GERD.  Doing well on omeprazole 20 mg daily.   Denies any upper GI symptoms including heartburn, indigestion, nausea, vomiting, dysphagia, odynophagia or anorexia.   Denies any lower GI symptoms including constipation, diarrhea, rectal bleeding, melena or weight loss.  Past Medical History  Diagnosis Date  . Uterine cancer 1998  . Chronic back pain   . GERD (gastroesophageal reflux disease) 01/21/2006    EGD Dr Darrick Penna mild chronic gastritis, (NO h pylori) otherwise normal    Past Surgical History  Procedure Date  . Cholecystectomy 11/1999  . S/p hysterectomy 1998    uterine ca  . Posterior fusion cervical spine 2003  . Tonsillectomy 1964  . Temperal/mandibular 1974  . Esophagogastroduodenoscopy 01/21/06    mild antral erythema bx h-pylori/normal esophagus without evidence of mass or Barrett's/normal pylorus and duodenum  . Cataracts     Current Outpatient Prescriptions  Medication Sig Dispense Refill  . aspirin-acetaminophen-caffeine (EXCEDRIN MIGRAINE) 250-250-65 MG per tablet Take 1 tablet by mouth every 6 (six) hours as needed. Very seldom for migraines      . B Complex-C (SUPER B COMPLEX PO) Take by mouth. One tablet daily       . Bilberry, Vaccinium myrtillus, (BILBERRY PO) Take 640 mg by mouth daily.        . Diphenhydramine-PSE-APAP (ALLERGY/SINUS HEADACHE PO) Take by mouth. One tablet at bedtime daily       . ibuprofen (ADVIL,MOTRIN) 200 MG tablet Take 200 mg by mouth every 6 (six) hours as needed. Takes one to two tablets daily       . loperamide (IMODIUM) 2 MG capsule Take 2 mg by mouth 4 (four) times daily as needed.        . Multiple Vitamin (MULTIVITAMIN) capsule Take 1 capsule by mouth daily.        . NON  FORMULARY Vitamin D   Unknown strength   One tablet daily       . NON FORMULARY Luetein   45 mg daily       . omeprazole (PRILOSEC) 20 MG capsule Take 1 capsule (20 mg total) by mouth daily.  90 capsule  3    Allergies as of 04/22/2011 - Review Complete 04/22/2011  Allergen Reaction Noted  . Aspirin Other (See Comments)   . Iron    . Sulfa antibiotics Rash 04/22/2011    Family History:There is no known family history of colorectal carcinoma , liver disease, or inflammatory bowel disease.  Problem Relation Age of Onset  . Liver cancer Father   . Alcohol abuse Father   . Hypertension Mother   . COPD Mother     History   Social History  . Marital Status: Widowed    Spouse Name: N/A    Number of Children: 1  . Years of Education: N/A   Occupational History  . retired; Dealer asst    Social History Main Topics  . Smoking status: Never Smoker   . Smokeless tobacco: Not on file  . Alcohol Use: Not on file  . Drug Use: Not on file  . Sexually Active: Not on file   Other Topics Concern  . Not on file   Social History Narrative   1 adopted  son-grownLives alone    Review of Systems: Gen: Denies any fever, chills, sweats, anorexia, fatigue, weakness, malaise, weight loss, and sleep disorder CV: Denies chest pain, angina, palpitations, syncope, orthopnea, PND, peripheral edema, and claudication. Resp: Denies dyspnea at rest, dyspnea with exercise, cough, sputum, wheezing, coughing up blood, and pleurisy. GI: Denies vomiting blood, jaundice, and fecal incontinence.   Denies dysphagia or odynophagia. GU : Denies urinary burning, blood in urine, urinary frequency, urinary hesitancy, nocturnal urination, and urinary incontinence. MS: Denies joint pain, limitation of movement, and swelling, stiffness, low back pain, extremity pain. Denies muscle weakness, cramps, atrophy.  Derm: Denies rash, itching, dry skin, hives, moles, warts, or unhealing ulcers.  Psych: Denies depression,  anxiety, memory loss, suicidal ideation, hallucinations, paranoia, and confusion. Heme: Denies bruising, bleeding, and enlarged lymph nodes.  Physical Exam: BP 140/82  Pulse 72  Temp(Src) 96.1 F (35.6 C) (Temporal)  Ht 5\' 3"  (1.6 m)  Wt 182 lb 12.8 oz (82.918 kg)  BMI 32.38 kg/m2 General:   Alert,  Well-developed, well-nourished, pleasant and cooperative in NAD Head:  Normocephalic and atraumatic. Eyes:  Sclera clear, no icterus.   Conjunctiva pink. Ears:  Normal auditory acuity. Nose:  No deformity, discharge,  or lesions. Mouth:  No deformity or lesions, OP pink/moist. Neck:  Supple; no masses or thyromegaly. Lungs:  Clear throughout to auscultation.   No wheezes, crackles, or rhonchi. No acute distress. Heart:  Regular rate and rhythm; no murmurs, clicks, rubs,  or gallops. Abdomen:  Soft, nontender and nondistended. No masses, hepatosplenomegaly or hernias noted. Normal bowel sounds, without guarding, and without rebound.   Rectal:  Deferred until time of colonoscopy.   Msk:  Symmetrical without gross deformities. Normal posture. Pulses:  Normal pulses noted. Extremities:  Without clubbing or edema. Neurologic:  Alert and  oriented x4;  grossly normal neurologically. Skin:  Intact without significant lesions or rashes. Cervical Nodes:  No significant cervical adenopathy. Psych:  Alert and cooperative. Normal mood and affect.

## 2011-04-22 NOTE — Assessment & Plan Note (Signed)
Average risk screening colonoscopy to be set up with Dr. Darrick Penna. I have discussed risks & benefits which include, but are not limited to, bleeding, infection, perforation & drug reaction.  The patient agrees with this plan & written consent will be obtained.

## 2011-04-22 NOTE — Patient Instructions (Signed)
Continue prilosec 20mg  daily Colonoscopy w/ Dr Darrick Penna as planned

## 2011-04-23 ENCOUNTER — Other Ambulatory Visit: Payer: Self-pay | Admitting: Gastroenterology

## 2011-04-23 DIAGNOSIS — Z1211 Encounter for screening for malignant neoplasm of colon: Secondary | ICD-10-CM

## 2011-05-06 ENCOUNTER — Encounter (HOSPITAL_COMMUNITY): Payer: Self-pay | Admitting: Pharmacy Technician

## 2011-05-12 MED ORDER — SODIUM CHLORIDE 0.45 % IV SOLN
Freq: Once | INTRAVENOUS | Status: AC
Start: 2011-05-12 — End: 2011-05-13
  Administered 2011-05-13: 08:00:00 via INTRAVENOUS

## 2011-05-12 NOTE — H&P (Signed)
04/22/2011 11:26 AM  Signed Primary Care Physician:  Carylon Perches, MD Primary Gastroenterologist:  Dr. Darrick Penna    Chief Complaint   Patient presents with   .  time for colonoscopy      HPI:  Kayla Thomas is a 70 y.o. female here to set up average risk screening colonoscopy.  She also has hx GERD.  Doing well on omeprazole 20 mg daily.   Denies any upper GI symptoms including heartburn, indigestion, nausea, vomiting, dysphagia, odynophagia or anorexia.   Denies any lower GI symptoms including constipation, diarrhea, rectal bleeding, melena or weight loss.    Past Medical History   Diagnosis  Date   .  Uterine cancer  1998   .  Chronic back pain     .  GERD (gastroesophageal reflux disease)  01/21/2006       EGD Dr Darrick Penna mild chronic gastritis, (NO h pylori) otherwise normal       Past Surgical History   Procedure  Date   .  Cholecystectomy  11/1999   .  S/p hysterectomy  1998       uterine ca   .  Posterior fusion cervical spine  2003   .  Tonsillectomy  1964   .  Temperal/mandibular  1974   .  Esophagogastroduodenoscopy  01/21/06       mild antral erythema bx h-pylori/normal esophagus without evidence of mass or Barrett's/normal pylorus and duodenum   .  Cataracts         Current Outpatient Prescriptions   Medication  Sig  Dispense  Refill   .  aspirin-acetaminophen-caffeine (EXCEDRIN MIGRAINE) 250-250-65 MG per tablet  Take 1 tablet by mouth every 6 (six) hours as needed. Very seldom for migraines         .  B Complex-C (SUPER B COMPLEX PO)  Take by mouth. One tablet daily          .  Bilberry, Vaccinium myrtillus, (BILBERRY PO)  Take 640 mg by mouth daily.           .  Diphenhydramine-PSE-APAP (ALLERGY/SINUS HEADACHE PO)  Take by mouth. One tablet at bedtime daily          .  ibuprofen (ADVIL,MOTRIN) 200 MG tablet  Take 200 mg by mouth every 6 (six) hours as needed. Takes one to two tablets daily          .  loperamide (IMODIUM) 2 MG capsule  Take 2 mg by mouth 4 (four)  times daily as needed.           .  Multiple Vitamin (MULTIVITAMIN) capsule  Take 1 capsule by mouth daily.           .  NON FORMULARY  Vitamin D   Unknown strength   One tablet daily          .  NON FORMULARY  Luetein   45 mg daily          .  omeprazole (PRILOSEC) 20 MG capsule  Take 1 capsule (20 mg total) by mouth daily.   90 capsule   3       Allergies as of 04/22/2011 - Review Complete 04/22/2011   Allergen  Reaction  Noted   .  Aspirin  Other (See Comments)     .  Iron       .  Sulfa antibiotics  Rash  04/22/2011       Family History:There is no known family history of colorectal  carcinoma , liver disease, or inflammatory bowel disease.   Problem  Relation  Age of Onset   .  Liver cancer  Father     .  Alcohol abuse  Father     .  Hypertension  Mother     .  COPD  Mother           History       Social History   .  Marital Status:  Widowed       Spouse Name:  N/A       Number of Children:  1   .  Years of Education:  N/A       Occupational History   .  retired; Dealer asst         Social History Main Topics   .  Smoking status:  Never Smoker    .  Smokeless tobacco:  Not on file   .  Alcohol Use:  Not on file   .  Drug Use:  Not on file   .  Sexually Active:  Not on file       Other Topics  Concern   .  Not on file       Social History Narrative     1 adopted son-grownLives alone      Review of Systems: Gen: Denies any fever, chills, sweats, anorexia, fatigue, weakness, malaise, weight loss, and sleep disorder CV: Denies chest pain, angina, palpitations, syncope, orthopnea, PND, peripheral edema, and claudication. Resp: Denies dyspnea at rest, dyspnea with exercise, cough, sputum, wheezing, coughing up blood, and pleurisy. GI: Denies vomiting blood, jaundice, and fecal incontinence.   Denies dysphagia or odynophagia. GU : Denies urinary burning, blood in urine, urinary frequency, urinary hesitancy, nocturnal urination, and urinary incontinence. MS:  Denies joint pain, limitation of movement, and swelling, stiffness, low back pain, extremity pain. Denies muscle weakness, cramps, atrophy.   Derm: Denies rash, itching, dry skin, hives, moles, warts, or unhealing ulcers.   Psych: Denies depression, anxiety, memory loss, suicidal ideation, hallucinations, paranoia, and confusion. Heme: Denies bruising, bleeding, and enlarged lymph nodes.   Physical Exam: BP 140/82  Pulse 72  Temp(Src) 96.1 F (35.6 C) (Temporal)  Ht 5\' 3"  (1.6 m)  Wt 182 lb 12.8 oz (82.918 kg)  BMI 32.38 kg/m2 General:   Alert,  Well-developed, well-nourished, pleasant and cooperative in NAD Head:  Normocephalic and atraumatic. Eyes:  Sclera clear, no icterus.   Conjunctiva pink. Ears:  Normal auditory acuity. Nose:  No deformity, discharge,  or lesions. Mouth:  No deformity or lesions, OP pink/moist. Neck:  Supple; no masses or thyromegaly. Lungs:  Clear throughout to auscultation.   No wheezes, crackles, or rhonchi. No acute distress. Heart:  Regular rate and rhythm; no murmurs, clicks, rubs,  or gallops. Abdomen:  Soft, nontender and nondistended. No masses, hepatosplenomegaly or hernias noted. Normal bowel sounds, without guarding, and without rebound.    Rectal:  Deferred until time of colonoscopy.    Msk:  Symmetrical without gross deformities. Normal posture. Pulses:  Normal pulses noted. Extremities:  Without clubbing or edema. Neurologic:  Alert and  oriented x4;  grossly normal neurologically. Skin:  Intact without significant lesions or rashes. Cervical Nodes:  No significant cervical adenopathy. Psych:  Alert and cooperative. Normal mood and affect.         [name], MD  04/22/2011 12:45 PM  Signed opv in 1 year REVIEWED. AGREE.     Ferne Reus  04/22/2011  1:10  PM  Signed Reminder in epic to follow up in one year  Marliss Czar A Watson  04/22/2011  3:50 PM  Signed Cc to PCP     GERD - Lorenza Burton, NP  04/22/2011 11:24 AM   Signed Doing well on omeprazole 20 mg daily. Refill for 90 with 3 refills for mail-order printed.  Screen for colon cancer Lorenza Burton, NP  04/22/2011 11:25 AM  Signed Average risk screening colonoscopy to be set up with Dr. Darrick Penna. I have discussed risks & benefits which include, but are not limited to, bleeding, infection, perforation & drug reaction.  The patient agrees with this plan & written consent will be obtained.

## 2011-05-13 ENCOUNTER — Ambulatory Visit (HOSPITAL_COMMUNITY)
Admission: RE | Admit: 2011-05-13 | Discharge: 2011-05-13 | Disposition: A | Discharge status: Home / Self Care | Payer: Medicare Other | Source: Ambulatory Visit | Attending: Gastroenterology | Admitting: Gastroenterology

## 2011-05-13 ENCOUNTER — Encounter (HOSPITAL_COMMUNITY): Payer: Self-pay

## 2011-05-13 ENCOUNTER — Encounter (HOSPITAL_COMMUNITY)
Admission: RE | Disposition: A | Discharge status: Home / Self Care | Payer: Self-pay | Source: Ambulatory Visit | Attending: Gastroenterology

## 2011-05-13 ENCOUNTER — Other Ambulatory Visit: Payer: Self-pay | Admitting: Gastroenterology

## 2011-05-13 DIAGNOSIS — Z1211 Encounter for screening for malignant neoplasm of colon: Secondary | ICD-10-CM

## 2011-05-13 DIAGNOSIS — D126 Benign neoplasm of colon, unspecified: Secondary | ICD-10-CM | POA: Insufficient documentation

## 2011-05-13 DIAGNOSIS — K648 Other hemorrhoids: Secondary | ICD-10-CM

## 2011-05-13 DIAGNOSIS — K5730 Diverticulosis of large intestine without perforation or abscess without bleeding: Secondary | ICD-10-CM

## 2011-05-13 HISTORY — PX: COLONOSCOPY: SHX5424

## 2011-05-13 HISTORY — DX: Nasal congestion: R09.81

## 2011-05-13 SURGERY — COLONOSCOPY
Anesthesia: Moderate Sedation

## 2011-05-13 MED ORDER — MEPERIDINE HCL 100 MG/ML IJ SOLN
INTRAMUSCULAR | Status: DC | PRN
  Administered 2011-05-13: 50 mg via INTRAVENOUS
  Administered 2011-05-13: 25 mg via INTRAVENOUS

## 2011-05-13 MED ORDER — MIDAZOLAM HCL 5 MG/5ML IJ SOLN
INTRAMUSCULAR | Status: DC | PRN
  Administered 2011-05-13: 2 mg via INTRAVENOUS
  Administered 2011-05-13: 1 mg via INTRAVENOUS

## 2011-05-13 MED ORDER — MIDAZOLAM HCL 5 MG/5ML IJ SOLN
INTRAMUSCULAR | Status: AC
  Filled 2011-05-13: qty 10

## 2011-05-13 MED ORDER — MEPERIDINE HCL 100 MG/ML IJ SOLN
INTRAMUSCULAR | Status: AC
  Filled 2011-05-13: qty 2

## 2011-05-13 NOTE — Interval H&P Note (Signed)
History and Physical Interval Note:   05/13/2011   8:34 AM   Kayla Thomas  has presented today for surgery, with the diagnosis of SCREENING TCS  The various methods of treatment have been discussed with the patient and family. After consideration of risks, benefits and other options for treatment, the patient has consented to  Procedure(s): COLONOSCOPY as a surgical intervention .  The patients' history has been reviewed, patient examined, no change in status, stable for surgery.  I have reviewed the patients' chart and labs.  Questions were answered to the patient's satisfaction.     [name]  MD  THE PATIENT WAS EXAMINED AND THERE IS NO CHANGE IN THE PATIENT'S CONDITION SINCE THE ORIGINAL H&P WAS COMPLETED.

## 2011-05-19 ENCOUNTER — Telehealth: Payer: Self-pay | Admitting: Gastroenterology

## 2011-05-19 NOTE — Telephone Encounter (Signed)
Please call pt. 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 TCS in 10-15 years IF THE BENEFITS OUTWEIGH THE RISKS. High fiber diet.  

## 2011-05-19 NOTE — Telephone Encounter (Signed)
Ginger spoke with pt. 

## 2011-05-19 NOTE — Telephone Encounter (Signed)
Pt aware of results and is doing ok.

## 2011-05-19 NOTE — Telephone Encounter (Signed)
Pt was returning a call, but wasn't sure who from. I told her that nurse was at lunch and would call her back in about 20 minutes or so. [phone]

## 2011-05-20 ENCOUNTER — Encounter (HOSPITAL_COMMUNITY): Payer: Self-pay | Admitting: Gastroenterology

## 2011-05-21 ENCOUNTER — Other Ambulatory Visit: Payer: Self-pay | Admitting: Obstetrics and Gynecology

## 2011-05-21 ENCOUNTER — Other Ambulatory Visit (HOSPITAL_COMMUNITY)
Admission: RE | Admit: 2011-05-21 | Discharge: 2011-05-21 | Disposition: A | Discharge status: Home / Self Care | Payer: Medicare Other | Source: Ambulatory Visit | Attending: Obstetrics and Gynecology | Admitting: Obstetrics and Gynecology

## 2011-05-21 DIAGNOSIS — Z124 Encounter for screening for malignant neoplasm of cervix: Secondary | ICD-10-CM | POA: Insufficient documentation

## 2011-05-22 ENCOUNTER — Other Ambulatory Visit: Payer: Self-pay | Admitting: Obstetrics and Gynecology

## 2011-05-22 DIAGNOSIS — Z139 Encounter for screening, unspecified: Secondary | ICD-10-CM

## 2011-05-26 NOTE — Telephone Encounter (Signed)
Results Cc to PCP  

## 2011-05-30 ENCOUNTER — Ambulatory Visit (HOSPITAL_COMMUNITY)
Admission: RE | Admit: 2011-05-30 | Discharge: 2011-05-30 | Disposition: A | Discharge status: Home / Self Care | Payer: Medicare Other | Source: Ambulatory Visit | Attending: Obstetrics and Gynecology | Admitting: Obstetrics and Gynecology

## 2011-05-30 DIAGNOSIS — Z1231 Encounter for screening mammogram for malignant neoplasm of breast: Secondary | ICD-10-CM | POA: Insufficient documentation

## 2011-05-30 DIAGNOSIS — Z139 Encounter for screening, unspecified: Secondary | ICD-10-CM

## 2011-07-09 DIAGNOSIS — M545 Low back pain, unspecified: Secondary | ICD-10-CM | POA: Diagnosis not present

## 2011-07-09 DIAGNOSIS — IMO0002 Reserved for concepts with insufficient information to code with codable children: Secondary | ICD-10-CM | POA: Diagnosis not present

## 2011-07-21 DIAGNOSIS — M545 Low back pain, unspecified: Secondary | ICD-10-CM | POA: Diagnosis not present

## 2011-07-21 DIAGNOSIS — IMO0002 Reserved for concepts with insufficient information to code with codable children: Secondary | ICD-10-CM | POA: Diagnosis not present

## 2011-08-20 DIAGNOSIS — M545 Low back pain, unspecified: Secondary | ICD-10-CM | POA: Diagnosis not present

## 2011-08-20 DIAGNOSIS — IMO0002 Reserved for concepts with insufficient information to code with codable children: Secondary | ICD-10-CM | POA: Diagnosis not present

## 2011-08-27 DIAGNOSIS — H2510 Age-related nuclear cataract, unspecified eye: Secondary | ICD-10-CM | POA: Diagnosis not present

## 2011-08-27 DIAGNOSIS — H538 Other visual disturbances: Secondary | ICD-10-CM | POA: Diagnosis not present

## 2011-09-11 DIAGNOSIS — Z79899 Other long term (current) drug therapy: Secondary | ICD-10-CM | POA: Diagnosis not present

## 2011-09-11 DIAGNOSIS — E785 Hyperlipidemia, unspecified: Secondary | ICD-10-CM | POA: Diagnosis not present

## 2011-09-15 DIAGNOSIS — R05 Cough: Secondary | ICD-10-CM | POA: Diagnosis not present

## 2011-09-15 DIAGNOSIS — K219 Gastro-esophageal reflux disease without esophagitis: Secondary | ICD-10-CM | POA: Diagnosis not present

## 2011-09-15 DIAGNOSIS — E785 Hyperlipidemia, unspecified: Secondary | ICD-10-CM | POA: Diagnosis not present

## 2011-09-15 DIAGNOSIS — R059 Cough, unspecified: Secondary | ICD-10-CM | POA: Diagnosis not present

## 2011-09-15 DIAGNOSIS — K589 Irritable bowel syndrome without diarrhea: Secondary | ICD-10-CM | POA: Diagnosis not present

## 2011-09-17 DIAGNOSIS — IMO0002 Reserved for concepts with insufficient information to code with codable children: Secondary | ICD-10-CM | POA: Diagnosis not present

## 2011-09-17 DIAGNOSIS — M545 Low back pain, unspecified: Secondary | ICD-10-CM | POA: Diagnosis not present

## 2011-09-29 DIAGNOSIS — R05 Cough: Secondary | ICD-10-CM | POA: Diagnosis not present

## 2011-09-29 DIAGNOSIS — R059 Cough, unspecified: Secondary | ICD-10-CM | POA: Diagnosis not present

## 2011-09-29 DIAGNOSIS — K2100 Gastro-esophageal reflux disease with esophagitis, without bleeding: Secondary | ICD-10-CM | POA: Diagnosis not present

## 2011-09-29 NOTE — Patient Instructions (Addendum)
20 Kayla Thomas  09/29/2011   Your procedure is scheduled on:  10/06/2011  Report to [facility] at  615  AM.  Call this number if you have problems the morning of surgery: [phone]   Remember:   Do not eat food:After Midnight.  May have clear liquids:until Midnight .  Clear liquids include soda, tea, black coffee, apple or grape juice, broth.  Take these medicines the morning of surgery with A SIP OF WATER:  prilosec   Do not wear jewelry, make-up or nail polish.  Do not wear lotions, powders, or perfumes. You may wear deodorant.  Do not shave 48 hours prior to surgery.  Do not bring valuables to the hospital.  Contacts, dentures or bridgework may not be worn into surgery.  Leave suitcase in the car. After surgery it may be brought to your room.  For patients admitted to the hospital, checkout time is 11:00 AM the day of discharge.   Patients discharged the day of surgery will not be allowed to drive home.  Name and phone number of your driver: family  Special Instructions: N/A   Please read over the following fact sheets that you were given: Pain Booklet, Surgical Site Infection Prevention, Anesthesia Post-op Instructions and Care and Recovery After Surgery Cataract A cataract is a clouding of the lens of the eye. When a lens becomes cloudy, vision is reduced based on the degree and nature of the clouding. Many cataracts reduce vision to some degree. Some cataracts make people more near-sighted as they develop. Other cataracts increase glare. Cataracts that are ignored and become worse can sometimes look white. The white color can be seen through the pupil. CAUSES   Aging. However, cataracts may occur at any age, even in newborns.   Certain drugs.   Trauma to the eye.   Certain diseases such as diabetes.   Specific eye diseases such as chronic inflammation inside the eye or a sudden attack of a rare form of glaucoma.   Inherited or acquired medical problems.  SYMPTOMS     Gradual, progressive drop in vision in the affected eye.   Severe, rapid visual loss. This most often happens when trauma is the cause.  DIAGNOSIS  To detect a cataract, an eye doctor examines the lens. Cataracts are best diagnosed with an exam of the eyes with the pupils enlarged (dilated) by drops.  TREATMENT  For an early cataract, vision may improve by using different eyeglasses or stronger lighting. If that does not help your vision, surgery is the only effective treatment. A cataract needs to be surgically removed when vision loss interferes with your everyday activities, such as driving, reading, or watching TV. A cataract may also have to be removed if it prevents examination or treatment of another eye problem. Surgery removes the cloudy lens and usually replaces it with a substitute lens (intraocular lens, IOL).  At a time when both you and your doctor agree, the cataract will be surgically removed. If you have cataracts in both eyes, only one is usually removed at a time. This allows the operated eye to heal and be out of danger from any possible problems after surgery (such as infection or poor wound healing). In rare cases, a cataract may be doing damage to your eye. In these cases, your caregiver may advise surgical removal right away. The vast majority of people who have cataract surgery have better vision afterward. HOME CARE INSTRUCTIONS  If you are not planning surgery, you may  be asked to do the following:  Use different eyeglasses.   Use stronger or brighter lighting.   Ask your eye doctor about reducing your medicine dose or changing medicines if it is thought that a medicine caused your cataract. Changing medicines does not make the cataract go away on its own.   Become familiar with your surroundings. Poor vision can lead to injury. Avoid bumping into things on the affected side. You are at a higher risk for tripping or falling.   Exercise extreme care when driving or  operating machinery.   Wear sunglasses if you are sensitive to bright light or experiencing problems with glare.  SEEK IMMEDIATE MEDICAL CARE IF:   You have a worsening or sudden vision loss.   You notice redness, swelling, or increasing pain in the eye.   You have a fever.  Document Released: 06/09/2005 Document Revised: 05/29/2011 Document Reviewed: 01/31/2011 [facility] Patient Information 2012 [place], [place].PATIENT INSTRUCTIONS POST-ANESTHESIA  IMMEDIATELY FOLLOWING SURGERY:  Do not drive or operate machinery for the first twenty four hours after surgery.  Do not make any important decisions for twenty four hours after surgery or while taking narcotic pain medications or sedatives.  If you develop intractable nausea and vomiting or a severe headache please notify your doctor immediately.  FOLLOW-UP:  Please make an appointment with your surgeon as instructed. You do not need to follow up with anesthesia unless specifically instructed to do so.  WOUND CARE INSTRUCTIONS (if applicable):  Keep a dry clean dressing on the anesthesia/puncture wound site if there is drainage.  Once the wound has quit draining you may leave it open to air.  Generally you should leave the bandage intact for twenty four hours unless there is drainage.  If the epidural site drains for more than 36-48 hours please call the anesthesia department.  QUESTIONS?:  Please feel free to call your physician or the hospital operator if you have any questions, and they will be happy to assist you.     [facility] Anesthesia Department [address] [place] [phone]

## 2011-09-30 ENCOUNTER — Encounter (HOSPITAL_COMMUNITY): Payer: Self-pay

## 2011-09-30 ENCOUNTER — Encounter (HOSPITAL_COMMUNITY): Payer: Self-pay | Admitting: Pharmacy Technician

## 2011-09-30 ENCOUNTER — Other Ambulatory Visit: Payer: Self-pay

## 2011-09-30 ENCOUNTER — Encounter (HOSPITAL_COMMUNITY)
Admission: RE | Admit: 2011-09-30 | Discharge: 2011-09-30 | Disposition: A | Discharge status: Home / Self Care | Payer: Medicare Other | Source: Ambulatory Visit | Attending: Ophthalmology | Admitting: Ophthalmology

## 2011-09-30 DIAGNOSIS — R059 Cough, unspecified: Secondary | ICD-10-CM

## 2011-09-30 DIAGNOSIS — R05 Cough: Secondary | ICD-10-CM

## 2011-09-30 MED ORDER — CYCLOPENTOLATE-PHENYLEPHRINE 0.2-1 % OP SOLN
OPHTHALMIC | Status: AC
  Filled 2011-09-30: qty 2

## 2011-10-03 MED ORDER — TETRACAINE HCL 0.5 % OP SOLN
OPHTHALMIC | Status: AC
  Filled 2011-10-03: qty 2

## 2011-10-03 MED ORDER — LIDOCAINE HCL 3.5 % OP GEL
OPHTHALMIC | Status: AC
  Filled 2011-10-03: qty 5

## 2011-10-06 ENCOUNTER — Ambulatory Visit (HOSPITAL_COMMUNITY): Payer: Medicare Other | Admitting: Anesthesiology

## 2011-10-06 ENCOUNTER — Ambulatory Visit (HOSPITAL_COMMUNITY)
Admission: RE | Admit: 2011-10-06 | Discharge: 2011-10-06 | Disposition: A | Discharge status: Home / Self Care | Payer: Medicare Other | Source: Ambulatory Visit | Attending: Ophthalmology | Admitting: Ophthalmology

## 2011-10-06 ENCOUNTER — Encounter (HOSPITAL_COMMUNITY): Payer: Self-pay | Admitting: *Deleted

## 2011-10-06 ENCOUNTER — Encounter (HOSPITAL_COMMUNITY)
Admission: RE | Disposition: A | Discharge status: Home / Self Care | Payer: Self-pay | Source: Ambulatory Visit | Attending: Ophthalmology

## 2011-10-06 ENCOUNTER — Encounter (HOSPITAL_COMMUNITY): Payer: Self-pay | Admitting: Anesthesiology

## 2011-10-06 DIAGNOSIS — Z01812 Encounter for preprocedural laboratory examination: Secondary | ICD-10-CM | POA: Diagnosis not present

## 2011-10-06 DIAGNOSIS — H2510 Age-related nuclear cataract, unspecified eye: Secondary | ICD-10-CM | POA: Insufficient documentation

## 2011-10-06 DIAGNOSIS — H538 Other visual disturbances: Secondary | ICD-10-CM | POA: Diagnosis not present

## 2011-10-06 DIAGNOSIS — Z01810 Encounter for preprocedural cardiovascular examination: Secondary | ICD-10-CM | POA: Diagnosis not present

## 2011-10-06 DIAGNOSIS — H269 Unspecified cataract: Secondary | ICD-10-CM | POA: Diagnosis not present

## 2011-10-06 HISTORY — PX: CATARACT EXTRACTION W/PHACO: SHX586

## 2011-10-06 LAB — POCT I-STAT 4, (NA,K, GLUC, HGB,HCT)
Glucose, Bld: 95 mg/dL (ref 70–99)
HCT: 44 % (ref 36.0–46.0)
Hemoglobin: 15 g/dL (ref 12.0–15.0)
Potassium: 4.2 mEq/L (ref 3.5–5.1)

## 2011-10-06 SURGERY — PHACOEMULSIFICATION, CATARACT, WITH IOL INSERTION
Anesthesia: Monitor Anesthesia Care | Site: Eye | Laterality: Left | Wound class: Clean

## 2011-10-06 MED ORDER — GATIFLOXACIN 0.5 % OP SOLN OPTIME - NO CHARGE
1.0000 [drp] | Freq: Once | OPHTHALMIC | Status: AC
  Administered 2011-10-06: 1 [drp] via OPHTHALMIC
  Filled 2011-10-06: qty 2.5

## 2011-10-06 MED ORDER — EPINEPHRINE HCL 1 MG/ML IJ SOLN
INTRAOCULAR | Status: DC | PRN
  Administered 2011-10-06: 10:00:00

## 2011-10-06 MED ORDER — ONDANSETRON HCL 4 MG/2ML IJ SOLN: 4.0000 mg | Freq: Once | INTRAMUSCULAR | Status: DC | PRN

## 2011-10-06 MED ORDER — BSS IO SOLN
INTRAOCULAR | Status: DC | PRN
  Administered 2011-10-06: 15 mL via INTRAOCULAR

## 2011-10-06 MED ORDER — LACTATED RINGERS IV SOLN
INTRAVENOUS | Status: DC
  Administered 2011-10-06: 1000 mL via INTRAVENOUS

## 2011-10-06 MED ORDER — NA HYALUR & NA CHOND-NA HYALUR 0.55-0.5 ML IO KIT
PACK | INTRAOCULAR | Status: DC | PRN
  Administered 2011-10-06: 1 via OPHTHALMIC

## 2011-10-06 MED ORDER — EPINEPHRINE HCL 1 MG/ML IJ SOLN
INTRAMUSCULAR | Status: AC
  Filled 2011-10-06: qty 1

## 2011-10-06 MED ORDER — GATIFLOXACIN 0.5 % OP SOLN OPTIME - NO CHARGE
OPHTHALMIC | Status: DC | PRN
  Administered 2011-10-06: 1 [drp] via OPHTHALMIC

## 2011-10-06 MED ORDER — MIDAZOLAM HCL 2 MG/2ML IJ SOLN
1.0000 mg | INTRAMUSCULAR | Status: DC | PRN
  Administered 2011-10-06: 2 mg via INTRAVENOUS

## 2011-10-06 MED ORDER — MIDAZOLAM HCL 2 MG/2ML IJ SOLN
INTRAMUSCULAR | Status: AC
  Administered 2011-10-06: 2 mg via INTRAVENOUS
  Filled 2011-10-06: qty 2

## 2011-10-06 MED ORDER — TETRACAINE 0.5 % OP SOLN OPTIME - NO CHARGE
OPHTHALMIC | Status: DC | PRN
  Administered 2011-10-06: 2 [drp] via OPHTHALMIC

## 2011-10-06 MED ORDER — FENTANYL CITRATE 0.05 MG/ML IJ SOLN: 25.0000 ug | INTRAMUSCULAR | Status: DC | PRN

## 2011-10-06 MED ORDER — LIDOCAINE 3.5 % OP GEL OPTIME - NO CHARGE
OPHTHALMIC | Status: DC | PRN
  Administered 2011-10-06: 2 [drp] via OPHTHALMIC

## 2011-10-06 MED ORDER — MOXIFLOXACIN HCL 0.5 % OP SOLN - NO CHARGE
1.0000 [drp] | Freq: Once | OPHTHALMIC | Status: AC
  Administered 2011-10-06: 1 [drp] via OPHTHALMIC
  Filled 2011-10-06: qty 3

## 2011-10-06 MED ORDER — KETOROLAC TROMETHAMINE 0.4 % OP SOLN - NO CHARGE
1.0000 [drp] | Freq: Once | OPHTHALMIC | Status: AC
  Administered 2011-10-06: 1 [drp] via OPHTHALMIC
  Filled 2011-10-06: qty 5

## 2011-10-06 MED ORDER — TETRACAINE HCL 0.5 % OP SOLN
1.0000 [drp] | Freq: Once | OPHTHALMIC | Status: AC
  Administered 2011-10-06: 1 [drp] via OPHTHALMIC

## 2011-10-06 SURGICAL SUPPLY — 28 items
CAPSULAR TENSION RING-AMO (OPHTHALMIC RELATED) IMPLANT
CLOTH BEACON ORANGE TIMEOUT ST (SAFETY) ×1 IMPLANT
GLOVE BIO SURGEON STRL SZ7.5 (GLOVE) IMPLANT
GLOVE BIOGEL M 6.5 STRL (GLOVE) IMPLANT
GLOVE BIOGEL PI IND STRL 6.5 (GLOVE) IMPLANT
GLOVE BIOGEL PI IND STRL 7.0 (GLOVE) IMPLANT
GLOVE BIOGEL PI INDICATOR 6.5 (GLOVE) ×2
GLOVE BIOGEL PI INDICATOR 7.0 (GLOVE) ×1
GLOVE ECLIPSE 6.5 STRL STRAW (GLOVE) IMPLANT
GLOVE ECLIPSE 7.5 STRL STRAW (GLOVE) IMPLANT
GLOVE EXAM NITRILE LRG STRL (GLOVE) IMPLANT
GLOVE EXAM NITRILE MD LF STRL (GLOVE) IMPLANT
GLOVE SKINSENSE NS SZ6.5 (GLOVE)
GLOVE SKINSENSE NS SZ7.0 (GLOVE)
GLOVE SKINSENSE STRL SZ6.5 (GLOVE) IMPLANT
GLOVE SKINSENSE STRL SZ7.0 (GLOVE) IMPLANT
GOWN BRE IMP SLV AUR LG STRL (GOWN DISPOSABLE) ×1 IMPLANT
INST SET CATARACT ~~LOC~~ (KITS) ×2 IMPLANT
KIT VITRECTOMY (OPHTHALMIC RELATED) IMPLANT
PAD ARMBOARD 7.5X6 YLW CONV (MISCELLANEOUS) ×1 IMPLANT
PROC W NO LENS (INTRAOCULAR LENS)
PROC W SPEC LENS (INTRAOCULAR LENS)
PROCESS W NO LENS (INTRAOCULAR LENS) IMPLANT
PROCESS W SPEC LENS (INTRAOCULAR LENS) IMPLANT
RING MALYGIN (MISCELLANEOUS) IMPLANT
SIGHTPATH CAT PROC W REG LENS (Ophthalmic Related) ×2 IMPLANT
VISCOELASTIC ADDITIONAL (OPHTHALMIC RELATED) IMPLANT
WATER STERILE IRR 250ML POUR (IV SOLUTION) ×1 IMPLANT

## 2011-10-06 NOTE — Op Note (Signed)
See op note created in another system

## 2011-10-06 NOTE — Transfer of Care (Signed)
Immediate Anesthesia Transfer of Care Note  Patient: Kayla Thomas  Procedure(s) Performed: Procedure(s) (LRB): CATARACT EXTRACTION PHACO AND INTRAOCULAR LENS PLACEMENT (IOC) (Left)  Patient Location: Shortstay  Anesthesia Type: MAC  Level of Consciousness: awake  Airway & Oxygen Therapy: Patient Spontanous Breathing   Post-op Assessment: Report given to PACU RN, Post -op Vital signs reviewed and stable and Patient moving all extremities  Post vital signs: Reviewed and stable  Complications: No apparent anesthesia complications

## 2011-10-06 NOTE — Discharge Instructions (Signed)
ELOINA ERGLE 10/06/2011 Dr. Lita Mains Post operative Instructions for Cataract Patients  These instructions are for FRENCHIE PRIBYL and pertain to the operative eye.  1.  Resume your normal diet and previous oral medicines.  2. Your Follow-up appointment is at Dr. Lita Mains' office in [place] on 10/07/11 at 1:30.  3. You may leave the hospital when your driver is present and your nurse releases you.  4. Begin Pred Forte (prednisolone acetate 1%), Acular LS (ketorolac tromethamine .4%) and Gatifloxacin 0.5% eye drops; 1 drop each 4 times daily to operative eye. Begin 3 hours after discharge from Short Stay Unit.  Moxifloxacin 0.5% may be substituted for Gatifloxacin using the same instructions.  5. Page Dr. Lita Mains via beeper [phone] for significant pain in or around operative eye that is not relieved by Tylenol.  6. If you took Plavix before surgery, restart it at the usual dose on the evening of surgery.  7. Wear dark glasses as necessary for excessive light sensitivity.  8. Do no forcefully rub you your operative eye.  9. Keep your operative eye dry for 1 week. You may gently clean your eyelids with a damp washcloth.  10. You may resume normal occupational activities in one week and resume driving as tolerated after the first post operative visit.  11. It is normal to have blurred vision and a scratchy sensation following surgery.  Dr. Lita Mains: [phone]

## 2011-10-06 NOTE — Anesthesia Procedure Notes (Signed)
Procedure Name: MAC Date/Time: 10/06/2011 10:06 AM Performed by: Franco Nones Pre-anesthesia Checklist: Patient identified, Emergency Drugs available, Suction available, Timeout performed and Patient being monitored Patient Re-evaluated:Patient Re-evaluated prior to inductionOxygen Delivery Method: Nasal Cannula

## 2011-10-06 NOTE — H&P (Signed)
I have reviewed the pre printed H&P, the patient was re-examined, and I have identified no significant interval changes in the patient's medical condition.  There is no change in the plan of care since the history and physical of record. 

## 2011-10-06 NOTE — Anesthesia Preprocedure Evaluation (Addendum)
Anesthesia Evaluation  Patient identified by MRN, date of birth, ID band Patient awake    Reviewed: Allergy & Precautions, H&P , NPO status , Patient's Chart, lab work & pertinent test results  History of Anesthesia Complications Negative for: history of anesthetic complications  Airway Mallampati: I  Neck ROM: Full    Dental  (+) Teeth Intact   Pulmonary neg pulmonary ROS,  breath sounds clear to auscultation        Cardiovascular negative cardio ROS  Rhythm:Regular Rate:Normal     Neuro/Psych  Headaches,    GI/Hepatic GERD-  Medicated and Controlled,  Endo/Other    Renal/GU      Musculoskeletal   Abdominal   Peds  Hematology   Anesthesia Other Findings   Reproductive/Obstetrics                           Anesthesia Physical Anesthesia Plan  ASA: II  Anesthesia Plan: MAC   Post-op Pain Management:    Induction: Intravenous  Airway Management Planned: Nasal Cannula  Additional Equipment:   Intra-op Plan:   Post-operative Plan:   Informed Consent: I have reviewed the patients History and Physical, chart, labs and discussed the procedure including the risks, benefits and alternatives for the proposed anesthesia with the patient or authorized representative who has indicated his/her understanding and acceptance.     Plan Discussed with:   Anesthesia Plan Comments:         Anesthesia Quick Evaluation

## 2011-10-06 NOTE — Brief Op Note (Signed)
DATE: 10/06/2011   TIME: 10:32 AM   PATIENT:  Bonnye Fava, 71 y.o., female   PRE-OPERATIVE DIAGNOSIS: nuclear cataract left eye    POST-OPERATIVE DIAGNOSIS:  nuclear cataract left eye    PROCEDURE(S):  Procedure(s): CATARACT EXTRACTION PHACO AND INTRAOCULAR LENS PLACEMENT (IOC)    SURGEON:  Surgeon(s) and Role:    * Susa Simmonds, MD - Primary    ASSISTANT:  Cyndie Chime, RN - Circulator Sherri Sear, CST - Scrub Person Kirstie Peri, RN - Circulator Assistant Hurshel Party, CST - Relief Scrub    ANESTHESIA  Topical and MAC   FINDINGS:  Dense nuclear cataract   IMPLANTS:  Implant Name Type Inv. Item Serial No. Manufacturer Lot No. LRB No. Used Action  SIGHTPATH CAT PROC W REG LENS - [id number] Ophthalmic Related SIGHTPATH CAT PROC W REG LENS [id number] [facility]   Left 1 Implanted      INDICATIONS: See H&P     PLAN OF CARE:  Discharge home per discharge instructions

## 2011-10-06 NOTE — Anesthesia Postprocedure Evaluation (Signed)
  Anesthesia Post-op Note  Patient: Kayla Thomas  Procedure(s) Performed: Procedure(s) (LRB): CATARACT EXTRACTION PHACO AND INTRAOCULAR LENS PLACEMENT (IOC) (Left)  Patient Location:  Short Stay  Anesthesia Type: MAC  Level of Consciousness: awake  Airway and Oxygen Therapy: Patient Spontanous Breathing  Post-op Pain: none  Post-op Assessment: Post-op Vital signs reviewed, Patient's Cardiovascular Status Stable, Respiratory Function Stable, Patent Airway, No signs of Nausea or vomiting and Pain level controlled  Post-op Vital Signs: Reviewed and stable  Complications: No apparent anesthesia complications

## 2011-10-08 ENCOUNTER — Encounter (HOSPITAL_COMMUNITY): Payer: Self-pay | Admitting: Ophthalmology

## 2011-10-09 ENCOUNTER — Ambulatory Visit (HOSPITAL_COMMUNITY)
Admission: RE | Admit: 2011-10-09 | Discharge: 2011-10-09 | Disposition: A | Discharge status: Home / Self Care | Payer: Medicare Other | Source: Ambulatory Visit | Attending: Pulmonary Disease | Admitting: Pulmonary Disease

## 2011-10-09 DIAGNOSIS — R05 Cough: Secondary | ICD-10-CM | POA: Diagnosis not present

## 2011-10-09 DIAGNOSIS — R0609 Other forms of dyspnea: Secondary | ICD-10-CM | POA: Diagnosis not present

## 2011-10-09 DIAGNOSIS — R059 Cough, unspecified: Secondary | ICD-10-CM | POA: Insufficient documentation

## 2011-10-09 DIAGNOSIS — R0989 Other specified symptoms and signs involving the circulatory and respiratory systems: Secondary | ICD-10-CM | POA: Insufficient documentation

## 2011-10-09 MED ORDER — ALBUTEROL SULFATE (5 MG/ML) 0.5% IN NEBU
2.5000 mg | INHALATION_SOLUTION | Freq: Once | RESPIRATORY_TRACT | Status: AC
  Administered 2011-10-09: 2.5 mg via RESPIRATORY_TRACT

## 2011-10-10 ENCOUNTER — Other Ambulatory Visit: Payer: Self-pay

## 2011-10-10 LAB — PULMONARY FUNCTION TEST

## 2011-10-13 NOTE — Procedures (Signed)
Kayla Thomas, Kayla Thomas NO.:  [id number]  MEDICAL RECORD NO.:  [id number]  LOCATION:                                 FACILITY:  PHYSICIAN:  Iam Lipson L. Juanetta Gosling, M.D.DATE OF BIRTH:  04-10-1941  DATE OF PROCEDURE: DATE OF DISCHARGE:                           PULMONARY FUNCTION TEST   Reason for pulmonary function testing is cough.  1. Spirometry shows a mild ventilatory defect with airflow obstruction     at the level of the smaller airways. 2. Lung volumes show mild reduction in total lung capacity. 3. DLCO is mildly reduced and does improve when corrected for volume.     4.  Airway resistance is elevated suggesting some airflow     obstruction. 4. There is no significant bronchodilator improvement.     [name] L. Juanetta Gosling, M.D.     ELH/MEDQ  D:  10/10/2011  T:  10/10/2011  Job:  [id number]

## 2011-10-14 NOTE — Procedures (Signed)
NAMEZULEYKA, KLOC           ACCOUNT NO.:  [id number]  MEDICAL RECORD NO.:  [id number]  LOCATION:  RESP                          FACILITY:  [facility]  PHYSICIAN:  Carolyne Whitsel L. Juanetta Gosling, M.D.DATE OF BIRTH:  22-Jan-1941  DATE OF PROCEDURE: DATE OF DISCHARGE:  10/09/2011                           PULMONARY FUNCTION TEST   Reason for pulmonary function testing is cough. 1. Spirometry shows a mild ventilatory defect without definite airflow     obstruction, although there is some airflow obstruction in the     smaller airways. 2. Lung volumes show minimal reduction in total lung capacity, but     normal residual volume. 3. DLCO is mildly reduced. 4. Airway resistance is elevated. 5. There is no significant bronchodilator improvement. 6. No definite cause of cough is seen on this pulmonary function test.     Ramon Dredge L. Juanetta Gosling, M.D.     ELH/MEDQ  D:  10/14/2011  T:  10/14/2011  Job:  [id number]

## 2011-10-20 DIAGNOSIS — R059 Cough, unspecified: Secondary | ICD-10-CM | POA: Diagnosis not present

## 2011-10-20 DIAGNOSIS — J3089 Other allergic rhinitis: Secondary | ICD-10-CM | POA: Diagnosis not present

## 2011-10-20 DIAGNOSIS — R05 Cough: Secondary | ICD-10-CM | POA: Diagnosis not present

## 2011-11-27 ENCOUNTER — Ambulatory Visit (INDEPENDENT_AMBULATORY_CARE_PROVIDER_SITE_OTHER): Payer: Medicare Other | Admitting: Otolaryngology

## 2011-11-27 DIAGNOSIS — K219 Gastro-esophageal reflux disease without esophagitis: Secondary | ICD-10-CM

## 2011-11-27 DIAGNOSIS — J310 Chronic rhinitis: Secondary | ICD-10-CM

## 2011-11-27 DIAGNOSIS — R070 Pain in throat: Secondary | ICD-10-CM

## 2012-01-01 ENCOUNTER — Ambulatory Visit (INDEPENDENT_AMBULATORY_CARE_PROVIDER_SITE_OTHER): Payer: Medicare Other | Admitting: Otolaryngology

## 2012-01-01 DIAGNOSIS — R070 Pain in throat: Secondary | ICD-10-CM

## 2012-01-01 DIAGNOSIS — K219 Gastro-esophageal reflux disease without esophagitis: Secondary | ICD-10-CM | POA: Diagnosis not present

## 2012-02-12 ENCOUNTER — Ambulatory Visit (INDEPENDENT_AMBULATORY_CARE_PROVIDER_SITE_OTHER): Payer: Medicare Other | Admitting: Otolaryngology

## 2012-02-12 DIAGNOSIS — K219 Gastro-esophageal reflux disease without esophagitis: Secondary | ICD-10-CM | POA: Diagnosis not present

## 2012-02-12 DIAGNOSIS — R490 Dysphonia: Secondary | ICD-10-CM

## 2012-03-15 ENCOUNTER — Encounter: Payer: Self-pay | Admitting: Gastroenterology

## 2012-05-13 ENCOUNTER — Ambulatory Visit (INDEPENDENT_AMBULATORY_CARE_PROVIDER_SITE_OTHER): Payer: Medicare Other | Admitting: Otolaryngology

## 2012-05-13 DIAGNOSIS — K219 Gastro-esophageal reflux disease without esophagitis: Secondary | ICD-10-CM | POA: Diagnosis not present

## 2012-06-23 HISTORY — PX: BREAST LUMPECTOMY: SHX2

## 2012-06-29 ENCOUNTER — Other Ambulatory Visit: Payer: Self-pay | Admitting: Obstetrics and Gynecology

## 2012-06-29 DIAGNOSIS — Z139 Encounter for screening, unspecified: Secondary | ICD-10-CM

## 2012-07-02 ENCOUNTER — Ambulatory Visit (HOSPITAL_COMMUNITY)
Admission: RE | Admit: 2012-07-02 | Discharge: 2012-07-02 | Disposition: A | Discharge status: Home / Self Care | Payer: Medicare Other | Source: Ambulatory Visit | Attending: Obstetrics and Gynecology | Admitting: Obstetrics and Gynecology

## 2012-07-02 DIAGNOSIS — Z1231 Encounter for screening mammogram for malignant neoplasm of breast: Secondary | ICD-10-CM | POA: Diagnosis not present

## 2012-07-02 DIAGNOSIS — Z139 Encounter for screening, unspecified: Secondary | ICD-10-CM

## 2012-07-08 ENCOUNTER — Other Ambulatory Visit: Payer: Self-pay | Admitting: Obstetrics and Gynecology

## 2012-07-08 DIAGNOSIS — IMO0002 Reserved for concepts with insufficient information to code with codable children: Secondary | ICD-10-CM

## 2012-07-08 DIAGNOSIS — R928 Other abnormal and inconclusive findings on diagnostic imaging of breast: Secondary | ICD-10-CM

## 2012-07-23 DIAGNOSIS — Z1212 Encounter for screening for malignant neoplasm of rectum: Secondary | ICD-10-CM | POA: Diagnosis not present

## 2012-07-23 DIAGNOSIS — Z8542 Personal history of malignant neoplasm of other parts of uterus: Secondary | ICD-10-CM | POA: Diagnosis not present

## 2012-07-23 DIAGNOSIS — N949 Unspecified condition associated with female genital organs and menstrual cycle: Secondary | ICD-10-CM | POA: Diagnosis not present

## 2012-07-28 ENCOUNTER — Other Ambulatory Visit: Payer: Self-pay | Admitting: Obstetrics and Gynecology

## 2012-07-28 ENCOUNTER — Other Ambulatory Visit (HOSPITAL_COMMUNITY): Payer: Self-pay | Admitting: Internal Medicine

## 2012-07-28 ENCOUNTER — Ambulatory Visit (HOSPITAL_COMMUNITY)
Admission: RE | Admit: 2012-07-28 | Discharge: 2012-07-28 | Disposition: A | Discharge status: Home / Self Care | Payer: Medicare Other | Source: Ambulatory Visit | Attending: Obstetrics and Gynecology | Admitting: Obstetrics and Gynecology

## 2012-07-28 ENCOUNTER — Ambulatory Visit (HOSPITAL_COMMUNITY)
Admission: RE | Admit: 2012-07-28 | Discharge: 2012-07-28 | Disposition: A | Discharge status: Home / Self Care | Payer: Medicare Other | Source: Ambulatory Visit | Attending: Internal Medicine | Admitting: Internal Medicine

## 2012-07-28 DIAGNOSIS — N63 Unspecified lump in unspecified breast: Secondary | ICD-10-CM | POA: Diagnosis not present

## 2012-07-28 DIAGNOSIS — IMO0002 Reserved for concepts with insufficient information to code with codable children: Secondary | ICD-10-CM

## 2012-07-28 DIAGNOSIS — R928 Other abnormal and inconclusive findings on diagnostic imaging of breast: Secondary | ICD-10-CM

## 2012-07-28 DIAGNOSIS — C50919 Malignant neoplasm of unspecified site of unspecified female breast: Secondary | ICD-10-CM | POA: Insufficient documentation

## 2012-07-28 MED ORDER — LIDOCAINE HCL (PF) 2 % IJ SOLN
INTRAMUSCULAR | Status: AC
  Administered 2012-07-28: 9 mL via INTRADERMAL
  Filled 2012-07-28: qty 10

## 2012-07-28 NOTE — Progress Notes (Signed)
Lidocaine 2%          9mL injected                         Left breast biopsy performed

## 2012-08-03 DIAGNOSIS — C50919 Malignant neoplasm of unspecified site of unspecified female breast: Secondary | ICD-10-CM | POA: Diagnosis not present

## 2012-08-10 ENCOUNTER — Other Ambulatory Visit (HOSPITAL_COMMUNITY): Payer: Self-pay | Admitting: General Surgery

## 2012-08-10 ENCOUNTER — Encounter (HOSPITAL_COMMUNITY): Payer: Self-pay | Admitting: Pharmacy Technician

## 2012-08-10 DIAGNOSIS — C50912 Malignant neoplasm of unspecified site of left female breast: Secondary | ICD-10-CM

## 2012-08-11 NOTE — H&P (Signed)
  NTS SOAP Note  Vital Signs:  Vitals as of: 08/03/2012: Systolic 155: Diastolic 79: Heart Rate 78: Temp 97.72F: Height 77ft 3.5in: Weight 185Lbs 0 Ounces: BMI 32  BMI : 32.26 kg/m2  Subjective: This 72 Years 2 Months old Female presents for breast cancer.  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  No family h/o breast cancer.  No lump palpable by patient.  No nipple discharge.   Review of Symptoms:  Constitutional:unremarkable   Head:unremarkable    Eyes:unremarkable   Nose/Mouth/Throat:unremarkable Cardiovascular:  unremarkable   Respiratory:  cough Gastrointestinal:  unremarkable   Genitourinary:unremarkable       back pain Skin:unremarkable Hematolgic/Lymphatic:unremarkable     Allergic/Immunologic:unremarkable     Past Medical History:    Reviewed   Past Medical History  Surgical History: t an a, cholecystectomy, back surgery, cataract bilaterally Medical Problems: unremarkable Allergies: sulfa Medications: omeprazole, mvi, calcium   Social History:Reviewed  Social History  Preferred Language: English Race:  White Ethnicity: Other Age: 72 Years 2 Months Marital Status:  W Alcohol:  No Recreational drug(s):  No   Smoking Status: Never smoker reviewed on 08/03/2012 Functional Status reviewed on mm/dd/yyyy ------------------------------------------------ Bathing: Normal Cooking: Normal Dressing: Normal Driving: Normal Eating: Normal Managing Meds: Normal Oral Care: Normal Shopping: Normal Toileting: Normal Transferring: Normal Walking: Normal Cognitive Status reviewed on mm/dd/yyyy ------------------------------------------------ Attention: Normal Decision Making: Normal Language: Normal Memory: Normal Motor: Normal Perception: Normal Problem Solving: Normal Visual and Spatial: Normal   Family History:  Reviewed   Family History  Is there a  family history of:No family h/o breast cancer             Father:  Archivist             Mother:  Hypertension    Objective Information: General:  Well appearing, well nourished in no distress. Neck:  Supple without lymphadenopathy.  Heart:  RRR, no murmur or gallop.  Normal S1, S2.  No S3, S4.  Lungs:    CTA bilaterally, no wheezes, rhonchi, rales.  Breathing unlabored.     No dominant mass, nipple discharge, dimpling in either breast.  Axillas negative for palpable nodes.  Assessment:Left breast cancer  Diagnosis &amp; Procedure:    Plan:Discussed options including partial mastectomy/sentinel lymph node biopsy/ XRT vs modified radical mastectomy on the left breast. Patient will undergo left partial mastectomy after needle localization, sentinel lymph node biopsy, possible axillary dissection.  Patient Education:Alternative treatments to surgery were discussed with patient (and family).  Risks and benefits  of procedure were fully explained to the patient (and family) who gave informed consent. Patient/family questions were addressed.

## 2012-08-12 NOTE — Patient Instructions (Signed)
Kayla Thomas  08/12/2012   Your procedure is scheduled on:  08/18/12  Report to [facility] at 0730 AM.  Call this number if you have problems the morning of surgery: [phone]   Remember:   Do not eat food or drink liquids after midnight.   Take these medicines the morning of surgery with A SIP OF WATER: atrovent, prilosec   Do not wear jewelry, make-up or nail polish.  Do not wear lotions, powders, or perfumes. You may wear deodorant.  Do not shave 48 hours prior to surgery. Men may shave face and neck.  Do not bring valuables to the hospital.  Contacts, dentures or bridgework may not be worn into surgery.  Leave suitcase in the car. After surgery it may be brought to your room.  For patients admitted to the hospital, checkout time is 11:00 AM the day of  discharge.   Patients discharged the day of surgery will not be allowed to drive  home.  Name and phone number of your driver: family  Special Instructions: Shower using CHG 2 nights before surgery and the night before surgery.  If you shower the day of surgery use CHG.  Use special wash - you have one bottle of CHG for all showers.  You should use approximately 1/3 of the bottle for each shower.   Please read over the following fact sheets that you were given: Pain Booklet, MRSA Information, Surgical Site Infection Prevention, Anesthesia Post-op Instructions and Care and Recovery After Surgery   PATIENT INSTRUCTIONS POST-ANESTHESIA  IMMEDIATELY FOLLOWING SURGERY:  Do not drive or operate machinery for the first twenty four hours after surgery.  Do not make any important decisions for twenty four hours after surgery or while taking narcotic pain medications or sedatives.  If you develop intractable nausea and vomiting or a severe headache please notify your doctor immediately.  FOLLOW-UP:  Please make an appointment with your surgeon as instructed. You do not need to follow up with anesthesia unless specifically instructed to do  so.  WOUND CARE INSTRUCTIONS (if applicable):  Keep a dry clean dressing on the anesthesia/puncture wound site if there is drainage.  Once the wound has quit draining you may leave it open to air.  Generally you should leave the bandage intact for twenty four hours unless there is drainage.  If the epidural site drains for more than 36-48 hours please call the anesthesia department.  QUESTIONS?:  Please feel free to call your physician or the hospital operator if you have any questions, and they will be happy to assist you.      Exercises Following Breast Surgery Following all surgeries on the breast or axillary nodes, whether you had radiation treatment or not, exercises may help you return to your normal activities and way of life sooner. Before beginning any exercise, talk to your caregiver about what type of exercises will be best for you. Your caregiver may recommend getting physical therapy to help you, especially if you do not see any progress in a month of exercising. Some light exercises can be done right after the surgery, but any drains and sutures will be removed before doing the extended or heavy exercises. Generally, the exercises will lessen problems following the surgery. You can usually expect to have full range of motion of your arm back in 4 to 6 weeks.  HOME CARE INSTRUCTIONS  These exercises should be done for the first 3 to 7 days after surgery, but only with your doctor's  permission.   Use your affected arm (on the side where your surgery was) as you normally would when combing your hair, bathing, dressing and eating.  Raise your affected arm above the level of your heart for 45 minutes, 2 to 3 times a day, while lying down. Put your arm on pillows so that your hand is higher than your wrist and your elbow is a little higher than your shoulder. This will help decrease the swelling that may happen after surgery.  Exercise your affected arm while it is elevated above the level of  your heart by opening and closing your hand 15 to 25 times. Then, bend and straighten your elbow. Repeat this 3 to 4 times a day. This exercise helps reduce swelling by pumping lymph fluid out of your arm.  Practice deep breathing exercises (using your diaphragm) at least 6 times each day. While lying on your back, take a slow, deep breath. Breathe in as much air as you can while trying to expand your chest and abdomen (push your belly button away from your spine). Relax and breathe out. Repeat this 4 or 5 times. This exercise will help maintain normal movement of your chest, making it easier for your lungs to expand. Continue to do deep breathing exercises from now on.  Avoid sleeping on your affected arm or on that side.  Do not lift anything over 5 pounds.  Stop exercising if you develop pain in your chest, arm or shoulder, and call your caregiver.  Let your caregiver or therapist know if your arm becomes swollen after exercising.  Exercise in front of a mirror. This way you can see yourself exercising in a correct posture and using the correct motion that is recommended.  Do not use a heating pad on your arm of the side of the surgery. GENERAL GUIDELINES FOR EXERCISE The exercises described here can be done as soon as your doctor gives you permission. Be sure to talk to your doctor before trying any of them.   You will feel some tightness in your chest and armpit after surgery. This is normal. The tightness will decrease as you continue your exercise program.  Many women have a burning, tingling, numbness, or soreness on the back of the arm and/or chest wall. This is because the surgery irritated some of your nerve endings. Although the sensations may increase a few weeks after surgery, continue to do the exercises unless you notice unusual swelling or tenderness. (Tell your caregiver if this occurs.) Sometimes rubbing or stroking the area with your hand or a soft cloth can help make the area  less sensitive.  It may be helpful to do exercises after a warm shower when muscles are warm and relaxed.  Wear comfortable, loose clothing when doing the exercises.  Do the exercises until you feel a slow stretch. Hold each stretch at the end of the motion for a count of five. It is normal to feel some pulling as you stretch the skin and muscles that have been shortened because of the surgery. Do not do bouncing or jerky-type movements when doing any of the exercises. You should not feel pain as you do the exercises, only gentle stretching.  Do 5 to 7 repetitions of each exercise. Try to do each exercise correctly. If you have difficulty with the exercises, contact your doctor. You may need to be referred to a physical or occupational therapist.  Exercises should be done twice a day until you regain normal flexibility  and strength.  Be sure to take deep breaths, in and out, as you perform each exercise.  The exercises are designed so that you begin them lying down, move to sitting, and then finish standing. EXERCISES IN LYING POSITION These exercises should be performed on a bed or on the floor while lying on your back. Keep your knees and hips bent and feet flat.  Wand Exercise This exercise helps increase the forward motion of the shoulders. You will need a broom handle, yardstick, or other similar object to perform this exercise.   Hold the wand in both hands with palms facing up.  Lift the wand up over your head (as far as you can) using your unaffected arm to help lift the wand, until you feel a stretch in your affected arm.  Hold for five seconds.  Lower arms and repeat 5 to 7 times. Elbow Winging This exercise helps increase the mobility of the front of your chest and shoulder. It may take several weeks of regular exercise before your elbows will get close to the bed (or floor).   Clasp your hands behind your neck with your elbows pointing toward the ceiling.  Move your elbows  apart and down toward the bed (or floor).  Repeat 5 to 7 times. EXERCISES IN SITTING POSITION Shoulder Blade Stretch This exercise helps increase the mobility of the shoulder blades.   Sit in a chair very close to a table.  Place the unaffected arm on the table with your elbow bent and palm down. Do not move this arm during the exercise.  Place the affected arm on the table, palm down with your elbow straight.  Without moving your trunk, slide the affected arm toward the opposite side of the table. You should feel your shoulder blade move as you do this.  Relax your arm and repeat 5 to 7 times. Shoulder Blade Squeeze This exercise also helps increase the mobility of the shoulder blade.   Facing straight ahead, sit in a chair in front of a mirror without resting on the back of the chair.  Arms should be at your sides with elbows bent.  Squeeze shoulder blades together, bringing your elbows behind you. Keep your shoulders level as you do this exercise. Do not lift your shoulders up toward your ears.  Return to the starting position and repeat 5 to 7 times. Side Bending This exercise helps increase the mobility of the trunk/body.   Clasp your hands together in front of you and lift your arms slowly over your head, straightening your arms.  When your arms are over your head, bend your trunk to the right while bending at the waist and keeping your arms overhead.  Return to the starting position and bend to the left.  Repeat 5 to 7 times. EXERCISES IN STANDING POSITION Chest Wall Stretch This exercise helps stretch the chest wall.   Stand facing a corner with toes approximately 8 to 10 inches from the corner.  Bend your elbows and place forearms on the wall, one on each side of the corner. Your elbows should be as close to shoulder height as possible.  Keep your arms and feet in position and move your chest toward the corner. You will feel a stretch across your chest and  shoulders.  Return to starting position and repeat 5 to 7 times. Shoulder Stretch This exercise helps increase the mobility in the shoulder.   Stand facing the wall with your toes approximately 8 to 10  inches from the wall.  Place your hands on the wall. Use your fingers to "climb the wall," reaching as high as you can until you feel a stretch.  Return to starting position and repeat 5 to 7 times. THINGS TO KEEP IN MIND  Begin exercising slowly and keep going as you are able. Stop exercising and call your caregiver if you:  Get weaker, start losing your balance or start falling.  Have pain that gets worse.  Have new heaviness in your arm.  Have unusual swelling, or swelling gets worse.  Have headaches, dizziness, blurred vision, new numbness or tingling in arms or chest. It is important to exercise to keep muscles working as well as possible, but it is also important to be safe. Talk with your caregiver about realistic exercises for your condition. Then set goals for increasing your physical activity level.  Document Released: 12/30/2005 Document Revised: 09/01/2011 Document Reviewed: 07/24/2008 [facility] Patient Information 2013 [place], [place]. Mastectomy, With or Without Reconstruction Mastectomy (removal of the breast) is a procedure most commonly used to treat cancer (tumor) of the breast. Different procedures are available for treatment. This depends on the stage of the tumor (abnormal growths). Discuss this with your caregiver, surgeon (a specialist for performing operations such as this), or oncologist (someone specialized in the treatment of cancer). With proper information, you can decide which treatment is best for you. Although the sound of the word cancer is frightening to all of [place], the new treatments and medications can be a source of reassurance and comfort. If there are things you are worried about, discuss them with your caregiver. He or she can help comfort you and your  family. Some of the different procedures for treating breast cancer are:  Radical (extensive) mastectomy. This is an operation used to remove the entire breast, the muscles under the breast, and all of the glands (lymph nodes) under the arm. With all of the new treatments available for cancer of the breast, this procedure has become less common.  Modified radical mastectomy. This is a similar operation to the radical mastectomy described above. In the modified radical mastectomy, the muscles of the chest wall are not removed unless one of the lessor muscles is removed. One of the lessor muscles may be removed to allow better removal of the lymph nodes. The axillary lymph nodes are also removed. Rarely, during an axillary node dissection nerves to this area are damaged. Radiation therapy is then often used to the area following this surgery.  A total mastectomy also known as a complete or simple mastectomy. It involves removal of only the breast. The lymph nodes and the muscles are left in place.  In a lumpectomy, the lump is removed from the breast. This is the simplest form of surgical treatment. A sentinel lymph node biopsy may also be done. Additional treatment may be required. RISKS AND COMPLICATIONS The main problems that follow removal of the breast include:  Infection (germs start growing in the wound). This can usually be treated with antibiotics (medications that kill germs).  Lymphedema. This means the arm on the side of the breast that was operated on swells because the lymph (tissue fluid) cannot follow the main channels back into the body. This only occurs when the lymph nodes have had to be removed under the arm.  There may be some areas of numbness to the upper arm and around the incision (cut by the surgeon) in the breast. This happens because of the cutting of  or damage to some of the nerves in the area. This is most often unavoidable.  There may be difficultymoving the arm in a  full range of motion (moving in all directions) following surgery. This usually improves with time following use and exercise.  Recurrence of breast cancer may happen with the very best of surgery and follow up treatment. Sometimes small cancer cells that cannot be seen with the naked eye have already spread at the time of surgery. When this happens other treatment is available. This treatment may be radiation, medications or a combination of both. RECONSTRUCTION Reconstruction of the breast may be done immediately if there is not going to be post-operative radiation. This surgery is done for cosmetic (improve appearance) purposes to improve the physical appearance after the operation. This may be done in two ways:  It can be done using a saline filled prosthetic (an artificial breast which is filled with salt water). Silicone breast implants are now re-approved by the FDA and are being commonly used.  Reconstruction can be done using the body's own muscle/fat/skin. Your caregiver will discuss your options with you. Depending upon your needs or choice, together you will be able to determine which procedure is best for you. Document Released: 03/04/2001 Document Revised: 09/01/2011 Document Reviewed: 10/26/2007 [facility] Patient Information 2013 [place], [place].

## 2012-08-13 ENCOUNTER — Other Ambulatory Visit: Payer: Self-pay

## 2012-08-13 ENCOUNTER — Encounter (HOSPITAL_COMMUNITY)
Admission: RE | Admit: 2012-08-13 | Discharge: 2012-08-13 | Disposition: A | Discharge status: Home / Self Care | Payer: Medicare Other | Source: Ambulatory Visit | Attending: General Surgery | Admitting: General Surgery

## 2012-08-13 ENCOUNTER — Encounter (HOSPITAL_COMMUNITY): Payer: Self-pay

## 2012-08-13 ENCOUNTER — Ambulatory Visit (HOSPITAL_COMMUNITY)
Admission: RE | Admit: 2012-08-13 | Discharge: 2012-08-13 | Disposition: A | Discharge status: Home / Self Care | Payer: Medicare Other | Source: Ambulatory Visit | Attending: General Surgery | Admitting: General Surgery

## 2012-08-13 DIAGNOSIS — Z01818 Encounter for other preprocedural examination: Secondary | ICD-10-CM | POA: Diagnosis not present

## 2012-08-13 DIAGNOSIS — Z01812 Encounter for preprocedural laboratory examination: Secondary | ICD-10-CM | POA: Diagnosis not present

## 2012-08-13 DIAGNOSIS — Z01810 Encounter for preprocedural cardiovascular examination: Secondary | ICD-10-CM | POA: Diagnosis not present

## 2012-08-13 DIAGNOSIS — C50919 Malignant neoplasm of unspecified site of unspecified female breast: Secondary | ICD-10-CM | POA: Diagnosis not present

## 2012-08-13 DIAGNOSIS — Z171 Estrogen receptor negative status [ER-]: Secondary | ICD-10-CM | POA: Diagnosis not present

## 2012-08-13 HISTORY — DX: Headache: R51

## 2012-08-13 LAB — CBC WITH DIFFERENTIAL/PLATELET
Basophils Relative: 0 % (ref 0–1)
Hemoglobin: 14.6 g/dL (ref 12.0–15.0)
MCHC: 34.8 g/dL (ref 30.0–36.0)
Monocytes Relative: 6 % (ref 3–12)
Neutro Abs: 4.7 10*3/uL (ref 1.7–7.7)
Neutrophils Relative %: 70 % (ref 43–77)
RBC: 4.57 MIL/uL (ref 3.87–5.11)

## 2012-08-13 LAB — COMPREHENSIVE METABOLIC PANEL
ALT: 16 U/L (ref 0–35)
AST: 16 U/L (ref 0–37)
Albumin: 3.9 g/dL (ref 3.5–5.2)
Alkaline Phosphatase: 124 U/L — ABNORMAL HIGH (ref 39–117)
BUN: 21 mg/dL (ref 6–23)
Chloride: 102 mEq/L (ref 96–112)
Potassium: 4.7 mEq/L (ref 3.5–5.1)
Total Bilirubin: 0.4 mg/dL (ref 0.3–1.2)

## 2012-08-13 LAB — SURGICAL PCR SCREEN
MRSA, PCR: NEGATIVE
Staphylococcus aureus: NEGATIVE

## 2012-08-18 ENCOUNTER — Encounter (HOSPITAL_COMMUNITY)
Admission: RE | Disposition: A | Discharge status: Home / Self Care | Payer: Self-pay | Source: Ambulatory Visit | Attending: General Surgery

## 2012-08-18 ENCOUNTER — Encounter (HOSPITAL_COMMUNITY): Payer: Self-pay | Admitting: Anesthesiology

## 2012-08-18 ENCOUNTER — Ambulatory Visit (HOSPITAL_COMMUNITY)
Admission: RE | Admit: 2012-08-18 | Discharge: 2012-08-18 | Disposition: A | Discharge status: Home / Self Care | Payer: Medicare Other | Source: Ambulatory Visit | Attending: General Surgery | Admitting: General Surgery

## 2012-08-18 ENCOUNTER — Encounter (HOSPITAL_COMMUNITY): Payer: Self-pay

## 2012-08-18 ENCOUNTER — Ambulatory Visit (HOSPITAL_COMMUNITY): Payer: Medicare Other

## 2012-08-18 ENCOUNTER — Encounter (HOSPITAL_COMMUNITY): Payer: Self-pay | Admitting: *Deleted

## 2012-08-18 ENCOUNTER — Ambulatory Visit (HOSPITAL_COMMUNITY): Payer: Medicare Other | Admitting: Anesthesiology

## 2012-08-18 DIAGNOSIS — Z01818 Encounter for other preprocedural examination: Secondary | ICD-10-CM | POA: Diagnosis not present

## 2012-08-18 DIAGNOSIS — Z171 Estrogen receptor negative status [ER-]: Secondary | ICD-10-CM | POA: Diagnosis not present

## 2012-08-18 DIAGNOSIS — C50419 Malignant neoplasm of upper-outer quadrant of unspecified female breast: Secondary | ICD-10-CM

## 2012-08-18 DIAGNOSIS — Z01812 Encounter for preprocedural laboratory examination: Secondary | ICD-10-CM | POA: Insufficient documentation

## 2012-08-18 DIAGNOSIS — Z01810 Encounter for preprocedural cardiovascular examination: Secondary | ICD-10-CM | POA: Diagnosis not present

## 2012-08-18 DIAGNOSIS — C50912 Malignant neoplasm of unspecified site of left female breast: Secondary | ICD-10-CM

## 2012-08-18 DIAGNOSIS — C50919 Malignant neoplasm of unspecified site of unspecified female breast: Secondary | ICD-10-CM | POA: Insufficient documentation

## 2012-08-18 DIAGNOSIS — IMO0002 Reserved for concepts with insufficient information to code with codable children: Secondary | ICD-10-CM

## 2012-08-18 HISTORY — PX: PARTIAL MASTECTOMY WITH NEEDLE LOCALIZATION AND AXILLARY SENTINEL LYMPH NODE BX: SHX6009

## 2012-08-18 SURGERY — PARTIAL MASTECTOMY WITH NEEDLE LOCALIZATION AND AXILLARY SENTINEL LYMPH NODE BX
Anesthesia: General | Site: Breast | Laterality: Left | Wound class: Clean

## 2012-08-18 MED ORDER — EPHEDRINE SULFATE 50 MG/ML IJ SOLN
INTRAMUSCULAR | Status: DC | PRN
  Administered 2012-08-18: 15 mg via INTRAVENOUS

## 2012-08-18 MED ORDER — SODIUM CHLORIDE 0.9 % IJ SOLN
INTRAMUSCULAR | Status: DC | PRN
  Administered 2012-08-18: 11:00:00

## 2012-08-18 MED ORDER — BUPIVACAINE HCL (PF) 0.5 % IJ SOLN
INTRAMUSCULAR | Status: DC | PRN
  Administered 2012-08-18: 16 mL

## 2012-08-18 MED ORDER — LIDOCAINE HCL (PF) 1 % IJ SOLN
INTRAMUSCULAR | Status: AC
  Filled 2012-08-18: qty 5

## 2012-08-18 MED ORDER — DIPHENHYDRAMINE HCL 50 MG/ML IJ SOLN
INTRAMUSCULAR | Status: AC
  Filled 2012-08-18: qty 1

## 2012-08-18 MED ORDER — TECHNETIUM TC 99M SULFUR COLLOID FILTERED
0.5000 | Freq: Once | INTRAVENOUS | Status: AC | PRN
  Administered 2012-08-18: 0.5 via INTRADERMAL

## 2012-08-18 MED ORDER — ONDANSETRON HCL 4 MG/2ML IJ SOLN: 4.0000 mg | Freq: Once | INTRAMUSCULAR | Status: DC | PRN

## 2012-08-18 MED ORDER — HYDROCODONE-ACETAMINOPHEN 5-325 MG PO TABS: 1.0000 | ORAL_TABLET | Freq: Four times a day (QID) | ORAL | Status: DC | PRN

## 2012-08-18 MED ORDER — LACTATED RINGERS IV SOLN: INTRAVENOUS | Status: DC

## 2012-08-18 MED ORDER — DEXAMETHASONE SODIUM PHOSPHATE 4 MG/ML IJ SOLN
4.0000 mg | Freq: Once | INTRAMUSCULAR | Status: AC
  Administered 2012-08-18: 4 mg via INTRAVENOUS

## 2012-08-18 MED ORDER — PROPOFOL 10 MG/ML IV EMUL
INTRAVENOUS | Status: AC
  Filled 2012-08-18: qty 20

## 2012-08-18 MED ORDER — SUCCINYLCHOLINE CHLORIDE 20 MG/ML IJ SOLN
INTRAMUSCULAR | Status: DC | PRN
  Administered 2012-08-18: 100 mg via INTRAVENOUS

## 2012-08-18 MED ORDER — CEFAZOLIN SODIUM-DEXTROSE 2-3 GM-% IV SOLR: 2.0000 g | INTRAVENOUS | Status: DC

## 2012-08-18 MED ORDER — CEFAZOLIN SODIUM-DEXTROSE 2-3 GM-% IV SOLR
INTRAVENOUS | Status: AC
  Filled 2012-08-18: qty 50

## 2012-08-18 MED ORDER — FENTANYL CITRATE 0.05 MG/ML IJ SOLN
INTRAMUSCULAR | Status: AC
  Filled 2012-08-18: qty 5

## 2012-08-18 MED ORDER — BUPIVACAINE HCL (PF) 0.5 % IJ SOLN
INTRAMUSCULAR | Status: AC
  Filled 2012-08-18: qty 30

## 2012-08-18 MED ORDER — CHLORHEXIDINE GLUCONATE 4 % EX LIQD: 1.0000 "application " | Freq: Once | CUTANEOUS | Status: DC

## 2012-08-18 MED ORDER — FENTANYL CITRATE 0.05 MG/ML IJ SOLN
INTRAMUSCULAR | Status: DC | PRN
  Administered 2012-08-18: 100 ug via INTRAVENOUS
  Administered 2012-08-18 (×5): 50 ug via INTRAVENOUS

## 2012-08-18 MED ORDER — LIDOCAINE HCL (PF) 2 % IJ SOLN
INTRAMUSCULAR | Status: AC
  Filled 2012-08-18: qty 20

## 2012-08-18 MED ORDER — 0.9 % SODIUM CHLORIDE (POUR BTL) OPTIME
TOPICAL | Status: DC | PRN
  Administered 2012-08-18: 1000 mL

## 2012-08-18 MED ORDER — FENTANYL CITRATE 0.05 MG/ML IJ SOLN
INTRAMUSCULAR | Status: AC
  Filled 2012-08-18: qty 2

## 2012-08-18 MED ORDER — MIDAZOLAM HCL 2 MG/2ML IJ SOLN
INTRAMUSCULAR | Status: AC
  Filled 2012-08-18: qty 2

## 2012-08-18 MED ORDER — ONDANSETRON HCL 4 MG/2ML IJ SOLN
INTRAMUSCULAR | Status: AC
  Filled 2012-08-18: qty 2

## 2012-08-18 MED ORDER — METHYLENE BLUE 1 % INJ SOLN
INTRAMUSCULAR | Status: AC
  Filled 2012-08-18: qty 1

## 2012-08-18 MED ORDER — PROPOFOL 10 MG/ML IV EMUL
INTRAVENOUS | Status: DC | PRN
  Administered 2012-08-18: 150 mg via INTRAVENOUS

## 2012-08-18 MED ORDER — CEFAZOLIN SODIUM-DEXTROSE 2-3 GM-% IV SOLR
INTRAVENOUS | Status: DC | PRN
  Administered 2012-08-18: 2 g via INTRAVENOUS

## 2012-08-18 MED ORDER — LIDOCAINE HCL (CARDIAC) 10 MG/ML IV SOLN
INTRAVENOUS | Status: DC | PRN
  Administered 2012-08-18: 50 mg via INTRAVENOUS

## 2012-08-18 MED ORDER — DEXAMETHASONE SODIUM PHOSPHATE 4 MG/ML IJ SOLN
INTRAMUSCULAR | Status: AC
  Filled 2012-08-18: qty 1

## 2012-08-18 MED ORDER — ENOXAPARIN SODIUM 40 MG/0.4ML ~~LOC~~ SOLN
40.0000 mg | Freq: Once | SUBCUTANEOUS | Status: AC
  Administered 2012-08-18: 40 mg via SUBCUTANEOUS

## 2012-08-18 MED ORDER — METHYLENE BLUE 1 % INJ SOLN
INTRAMUSCULAR | Status: AC
  Filled 2012-08-18: qty 2

## 2012-08-18 MED ORDER — FENTANYL CITRATE 0.05 MG/ML IJ SOLN
25.0000 ug | INTRAMUSCULAR | Status: DC | PRN
  Administered 2012-08-18 (×2): 50 ug via INTRAVENOUS

## 2012-08-18 MED ORDER — ONDANSETRON HCL 4 MG/2ML IJ SOLN
4.0000 mg | Freq: Once | INTRAMUSCULAR | Status: AC
  Administered 2012-08-18: 4 mg via INTRAVENOUS

## 2012-08-18 MED ORDER — MIDAZOLAM HCL 2 MG/2ML IJ SOLN
1.0000 mg | INTRAMUSCULAR | Status: DC | PRN
  Administered 2012-08-18: 2 mg via INTRAVENOUS

## 2012-08-18 MED ORDER — LACTATED RINGERS IV SOLN
INTRAVENOUS | Status: DC | PRN
  Administered 2012-08-18 (×2): via INTRAVENOUS

## 2012-08-18 MED ORDER — ENOXAPARIN SODIUM 40 MG/0.4ML ~~LOC~~ SOLN
SUBCUTANEOUS | Status: AC
  Filled 2012-08-18: qty 0.4

## 2012-08-18 MED ORDER — DIPHENHYDRAMINE HCL 50 MG/ML IJ SOLN
25.0000 mg | Freq: Once | INTRAMUSCULAR | Status: AC
  Administered 2012-08-18: 25 mg via INTRAVENOUS

## 2012-08-18 SURGICAL SUPPLY — 40 items
ADH SKN CLS APL DERMABOND .7 (GAUZE/BANDAGES/DRESSINGS) ×1
APPLIER CLIP 9.375 SM OPEN (CLIP) ×2
APR CLP SM 9.3 20 MLT OPN (CLIP) ×1
BAG HAMPER (MISCELLANEOUS) ×2 IMPLANT
BNDG CMPR 82X61 PLY HI ABS (GAUZE/BANDAGES/DRESSINGS) ×1
BNDG CONFORM 6X.82 1P STRL (GAUZE/BANDAGES/DRESSINGS) ×2 IMPLANT
CLIP APPLIE 9.375 SM OPEN (CLIP) IMPLANT
CLOTH BEACON ORANGE TIMEOUT ST (SAFETY) ×2 IMPLANT
COVER LIGHT HANDLE STERIS (MISCELLANEOUS) ×4 IMPLANT
DECANTER SPIKE VIAL GLASS SM (MISCELLANEOUS) ×2 IMPLANT
DERMABOND ADVANCED (GAUZE/BANDAGES/DRESSINGS) ×1
DERMABOND ADVANCED .7 DNX12 (GAUZE/BANDAGES/DRESSINGS) IMPLANT
DURAPREP 26ML APPLICATOR (WOUND CARE) ×2 IMPLANT
ELECT REM PT RETURN 9FT ADLT (ELECTROSURGICAL) ×2
ELECTRODE REM PT RTRN 9FT ADLT (ELECTROSURGICAL) ×1 IMPLANT
GLOVE BIO SURGEON STRL SZ7.5 (GLOVE) ×2 IMPLANT
GLOVE BIOGEL PI IND STRL 7.0 (GLOVE) IMPLANT
GLOVE BIOGEL PI INDICATOR 7.0 (GLOVE) ×3
GLOVE ECLIPSE 6.5 STRL STRAW (GLOVE) ×2 IMPLANT
GLOVE SS BIOGEL STRL SZ 6.5 (GLOVE) IMPLANT
GLOVE SUPERSENSE BIOGEL SZ 6.5 (GLOVE) ×1
GOWN STRL REIN XL XLG (GOWN DISPOSABLE) ×6 IMPLANT
KIT ROOM TURNOVER APOR (KITS) ×2 IMPLANT
MANIFOLD NEPTUNE II (INSTRUMENTS) ×2 IMPLANT
NDL HYPO 25X1 1.5 SAFETY (NEEDLE) ×1 IMPLANT
NEEDLE HYPO 25X1 1.5 SAFETY (NEEDLE) ×2 IMPLANT
NS IRRIG 1000ML POUR BTL (IV SOLUTION) ×2 IMPLANT
PACK MINOR (CUSTOM PROCEDURE TRAY) ×2 IMPLANT
PAD ARMBOARD 7.5X6 YLW CONV (MISCELLANEOUS) ×2 IMPLANT
SET BASIN LINEN APH (SET/KITS/TRAYS/PACK) ×2 IMPLANT
SPONGE GAUZE 2X2 8PLY STRL LF (GAUZE/BANDAGES/DRESSINGS) IMPLANT
SPONGE LAP 18X18 X RAY DECT (DISPOSABLE) ×2 IMPLANT
STOCKINETTE IMPERVIOUS LG (DRAPES) ×2 IMPLANT
STRIP CLOSURE SKIN 1/4X3 (GAUZE/BANDAGES/DRESSINGS) IMPLANT
SUT SILK 2 0 SH (SUTURE) ×1 IMPLANT
SUT VIC AB 3-0 SH 27 (SUTURE) ×4
SUT VIC AB 3-0 SH 27X BRD (SUTURE) ×1 IMPLANT
SUT VIC AB 4-0 PS2 27 (SUTURE) ×3 IMPLANT
SYR BULB IRRIGATION 50ML (SYRINGE) ×1 IMPLANT
SYR CONTROL 10ML LL (SYRINGE) ×3 IMPLANT

## 2012-08-18 NOTE — Anesthesia Preprocedure Evaluation (Signed)
Anesthesia Evaluation  Patient identified by MRN, date of birth, ID band Patient awake    Reviewed: Allergy & Precautions, H&P , NPO status , Patient's Chart, lab work & pertinent test results  History of Anesthesia Complications Negative for: history of anesthetic complications  Airway Mallampati: I  Neck ROM: Full    Dental  (+) Teeth Intact   Pulmonary neg pulmonary ROS,  breath sounds clear to auscultation        Cardiovascular negative cardio ROS  Rhythm:Regular Rate:Normal     Neuro/Psych  Headaches,    GI/Hepatic GERD-  Medicated and Controlled,  Endo/Other    Renal/GU      Musculoskeletal   Abdominal   Peds  Hematology   Anesthesia Other Findings   Reproductive/Obstetrics                           Anesthesia Physical Anesthesia Plan  ASA: II  Anesthesia Plan: General   Post-op Pain Management:    Induction: Intravenous, Rapid sequence and Cricoid pressure planned  Airway Management Planned: Oral ETT  Additional Equipment:   Intra-op Plan:   Post-operative Plan: Extubation in OR  Informed Consent:   Plan Discussed with:   Anesthesia Plan Comments:         Anesthesia Quick Evaluation

## 2012-08-18 NOTE — Op Note (Signed)
Patient:  Kayla Thomas  DOB:  10/26/1940  MRN:  [id number]   Preop Diagnosis:  Left breast carcinoma  Postop Diagnosis:  Same  Procedure:  Left partial mastectomy after needle localization, left axillary sentinel lymph node biopsy  Surgeon:  Franky Macho, M.D.  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 After extensive discussion with the patient, she elected to proceed with a left partial mastectomy after needle localization with sentinel lymph node biopsy. The risks and benefits of the procedures including bleeding, infection, nerve injury, and the possibility of an incomplete dissection the left breast were fully explained to the patient, who gave informed consent.  Procedure note:  The patient was placed in the supine position after the left breast cancer was needle localized in the radiology department. The patient had already been injected with radioactive nuclide. The dye was also instilled in order to facilitate the sentinel lymph node dissection. Was massaged into the breast for 5 minutes. The left breast and axilla were then prepped and draped using usual sterile technique with DuraPrep. Surgical site confirmation was performed.  Initially, we turned our attention to the left axillary lymph node biopsy. Using the neoprobe, a small incision was made the left axilla was taken down to the area of high counts. Multiple lymph nodes were removed in this area, though a significant amount of them did not look blue. They were sent to pathology for further examination. Pathology had a hard time with the frozen section for the lymph nodes do to fatty infiltration of the lymph nodes. They think approximately 5 lymph nodes were removed. Of those sections that showed lymph node tissue, no metastatic disease was noted, though they are deferring to final pathology. [place] counts were less than  10% of the highest in vivo count. No palpable lymph nodes were noted. It was elected to proceed with closure of the left axillary incision. The subcutaneous layer was reapproximated using 3-0 Vicryl interrupted sutures. The skin was closed a 4-0 Vicryl subcuticular suture. 0.5% Sensorcaine was instilled the surrounding wound. Dermabond was applied at the end of both procedures.  Next, a left partial mastectomy was performed. Using the needle as a guide, an incision was made in the upper, outer quadrant of the left breast and was taken down to the area of concern. A wide margin was done down to the chest wall. A short suture was placed superiorly and a long suture placed laterally for orientation purposes. Specimen sent to radiology who confirmed that the suspicious area was within the specimen removed. It was then sent to pathology further examination. The wound is copiously irrigated normal saline. Any bleeding was controlled using Bovie electrocautery. The skin was reapproximated using a 4-0 Vicryl subcuticular suture. 0.5% Sensorcaine was instilled the surrounding wound. Dermabond was then applied.  All tape and needle counts were correct at the end of the procedure. Patient was extubated in the operating room and transferred to PACU in stable condition  Complications:  None  EBL:  Minimal  Specimen:  Left axillary sentinel lymph nodes, left breast tissue (short suture superior, long suture lateral)

## 2012-08-18 NOTE — Anesthesia Postprocedure Evaluation (Signed)
  Anesthesia Post-op Note  Patient: Kayla Thomas  Procedure(s) Performed: Procedure(s) with comments: PARTIAL MASTECTOMY WITH NEEDLE LOCALIZATION AND AXILLARY SENTINEL LYMPH NODE BX (Left) - Need Frozen Section/Sentinel Node Bx @ 8:00am/Needle Loc @ 9:00am  Patient Location: PACU  Anesthesia Type:General  Level of Consciousness: awake, alert , oriented and patient cooperative  Airway and Oxygen Therapy: Patient Spontanous Breathing and Patient connected to face mask oxygen  Post-op Pain: mild  Post-op Assessment: Post-op Vital signs reviewed, Patient's Cardiovascular Status Stable, Respiratory Function Stable and Patent Airway  Post-op Vital Signs: Reviewed and stable  Complications: No apparent anesthesia complications

## 2012-08-18 NOTE — Preoperative (Signed)
Beta Blockers   Reason not to administer Beta Blockers:Not Applicable 

## 2012-08-18 NOTE — Interval H&P Note (Signed)
History and Physical Interval Note:  08/18/2012 10:47 AM  Kayla Thomas  has presented today for surgery, with the diagnosis of left breast cancer  The various methods of treatment have been discussed with the patient and family. After consideration of risks, benefits and other options for treatment, the patient has consented to  Procedure(s) with comments: PARTIAL MASTECTOMY WITH NEEDLE LOCALIZATION AND AXILLARY SENTINEL LYMPH NODE BX (Left) - Need Frozen Section/Sentinel Node Bx @ 8:00am/Needle Loc @ 9:00am as a surgical intervention .  The patient's history has been reviewed, patient examined, no change in status, stable for surgery.  I have reviewed the patient's chart and labs.  Questions were answered to the patient's satisfaction.     Franky Macho A

## 2012-08-18 NOTE — Transfer of Care (Signed)
Immediate Anesthesia Transfer of Care Note  Patient: Kayla Thomas  Procedure(s) Performed: Procedure(s) with comments: PARTIAL MASTECTOMY WITH NEEDLE LOCALIZATION AND AXILLARY SENTINEL LYMPH NODE BX (Left) - Need Frozen Section/Sentinel Node Bx @ 8:00am/Needle Loc @ 9:00am  Patient Location: PACU  Anesthesia Type:General  Level of Consciousness: awake, alert , oriented and patient cooperative  Airway & Oxygen Therapy: Patient Spontanous Breathing and Patient connected to face mask oxygen  Post-op Assessment: Report given to PACU RN and Post -op Vital signs reviewed and stable  Post vital signs: Reviewed and stable  Complications: No apparent anesthesia complications

## 2012-08-18 NOTE — Anesthesia Procedure Notes (Signed)
Procedure Name: Intubation Date/Time: 08/18/2012 11:16 AM Performed by: Carolyne Littles, AMY L Pre-anesthesia Checklist: Patient identified, Patient being monitored, Timeout performed, Emergency Drugs available and Suction available Patient Re-evaluated:Patient Re-evaluated prior to inductionOxygen Delivery Method: Circle System Utilized Preoxygenation: Pre-oxygenation with 100% oxygen Intubation Type: IV induction, Rapid sequence and Cricoid Pressure applied Laryngoscope Size: 3 and Miller Grade View: Grade I Tube type: Oral Tube size: 7.0 mm Number of attempts: 1 Airway Equipment and Method: stylet Placement Confirmation: ETT inserted through vocal cords under direct vision,  positive ETCO2 and breath sounds checked- equal and bilateral Secured at: 21 cm Tube secured with: Tape Dental Injury: Teeth and Oropharynx as per pre-operative assessment

## 2012-08-19 ENCOUNTER — Encounter (HOSPITAL_COMMUNITY): Payer: Self-pay | Admitting: General Surgery

## 2012-09-17 DIAGNOSIS — G43009 Migraine without aura, not intractable, without status migrainosus: Secondary | ICD-10-CM | POA: Diagnosis not present

## 2012-09-17 DIAGNOSIS — Z79899 Other long term (current) drug therapy: Secondary | ICD-10-CM | POA: Diagnosis not present

## 2012-09-17 DIAGNOSIS — K2100 Gastro-esophageal reflux disease with esophagitis, without bleeding: Secondary | ICD-10-CM | POA: Diagnosis not present

## 2012-09-17 DIAGNOSIS — E785 Hyperlipidemia, unspecified: Secondary | ICD-10-CM | POA: Diagnosis not present

## 2012-09-22 ENCOUNTER — Encounter (HOSPITAL_COMMUNITY): Payer: Medicare Other | Attending: Oncology | Admitting: Oncology

## 2012-09-22 ENCOUNTER — Encounter (HOSPITAL_COMMUNITY): Payer: Self-pay | Admitting: Oncology

## 2012-09-22 VITALS — BP 122/60 | HR 60 | Temp 97.8°F | Resp 18 | Wt 183.0 lb

## 2012-09-22 DIAGNOSIS — G43009 Migraine without aura, not intractable, without status migrainosus: Secondary | ICD-10-CM | POA: Diagnosis not present

## 2012-09-22 DIAGNOSIS — E785 Hyperlipidemia, unspecified: Secondary | ICD-10-CM | POA: Diagnosis not present

## 2012-09-22 DIAGNOSIS — Z8542 Personal history of malignant neoplasm of other parts of uterus: Secondary | ICD-10-CM | POA: Diagnosis not present

## 2012-09-22 DIAGNOSIS — Z170 Estrogen receptor positive status [ER+]: Secondary | ICD-10-CM

## 2012-09-22 DIAGNOSIS — C50412 Malignant neoplasm of upper-outer quadrant of left female breast: Secondary | ICD-10-CM

## 2012-09-22 DIAGNOSIS — C50419 Malignant neoplasm of upper-outer quadrant of unspecified female breast: Secondary | ICD-10-CM

## 2012-09-22 DIAGNOSIS — Z79899 Other long term (current) drug therapy: Secondary | ICD-10-CM | POA: Diagnosis not present

## 2012-09-22 DIAGNOSIS — K2100 Gastro-esophageal reflux disease with esophagitis, without bleeding: Secondary | ICD-10-CM | POA: Diagnosis not present

## 2012-09-22 NOTE — Patient Instructions (Addendum)
.  [facility] Discharge Instructions  RECOMMENDATIONS MADE BY THE CONSULTANT AND ANY TEST RESULTS WILL BE SENT TO YOUR REFERRING PHYSICIAN.  EXAM FINDINGS BY THE PHYSICIAN TODAY AND SIGNS OR SYMPTOMS TO REPORT TO CLINIC OR PRIMARY PHYSICIAN:  Very good prognosis for this breast cancer You will have radiation then will take an anti hormone pill    INSTRUCTIONS GIVEN AND DISCUSSED: Continue mammograms yearly  SPECIAL INSTRUCTIONS/FOLLOW-UP: Return to see Dr. Lovell Sheehan tomorrow at 915 am Return to see [place] in 9 weeks  Thank you for choosing [facility] to provide your oncology and hematology care.  To afford each patient quality time with our providers, please arrive at least 15 minutes before your scheduled appointment time.  With your help, our goal is to use those 15 minutes to complete the necessary work-up to ensure our physicians have the information they need to help with your evaluation and healthcare recommendations.    Effective January 1st, 2014, we ask that you re-schedule your appointment with our physicians should you arrive 10 or more minutes late for your appointment.  We strive to give you quality time with our providers, and arriving late affects you and other patients whose appointments are after yours.    Again, thank you for choosing [facility]  Our hope is that these requests will decrease the amount of time that you wait before being seen by our physicians.       _____________________________________________________________  Should you have questions after your visit to [facility], please contact our office at [phone] between the hours of 8:30 a.m. and 5:00 p.m.  Voicemails left after 4:30 p.m. will not be returned until the following business day.  For prescription refill requests, have your pharmacy contact our office with your prescription refill request.

## 2012-09-22 NOTE — Progress Notes (Signed)
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 Her cancer was ER +100%, PR 0%, HER-2/neu nonamplified, Ki-67 marker low at 7%, and the tumor itself was 1.2 cm. There was no LV I. Margins were clear. Her definitive date of surgery was 08/18/2012. She did not use prior hormones. She has never been pregnant however. She went through the menopause in her mid to late 64s.  #2 history of cancer the uterus status post surgery followed by radiation therapy in 1998. She does not remember the stage of her disease nor do I have access to those records.  #3 excessive weight though she has lost 20 pounds in the last year by dietary changes. She weighed at the most 210 pounds.  #4 history of bilateral cataract operations with lens implants #5 history migraine headaches #6 sister GERD  Very pleasant lady who was found on routine mammography to have an abnormality in the left breast. Nothing was seen abnormal the right breast on mammography. Biopsy was performed to 5 2014 confirm the suspicion that she had breast cancer.  She then had definitive lumpectomy with sentinel node biopsy.  She is here for consultation. She has not seen radiation therapist yet.  There is no family history of breast cancer. She has 2 brothers still alive. She had no sisters. Parents are both deceased. There no aunts or uncles with breast cancer or colon cancer. She did not know her grandparents. They appeared to be deceased she states before she ever got a chance to know that. Her oncology review of systems otherwise is noncontributory. She is not a smoker nor drinker.  BP 122/60  Pulse 60  Temp(Src) 97.8 F (36.6 C) (Oral)  Resp 18  Wt 183 lb (83.008 kg)  BMI 32.43 kg/m2  She is in no acute distress. She is a very pleasant lady. Facial symmetry appears intact her pupil shows prior  changes of cataract operations. Her lungs are clear to auscultation and percussion. She has no thyromegaly. The left breast has a incision in the upper outer quadrant that's approximate 2 inches long slightly erythematous around the edges is still tender. It is slightly warm to the touch. She has a possible seroma cavity below this measured approximately 3 x 4 cm. There is no axillary adenopathy. She has no cervical adenopathy. She has no supraclavicular or infraclavicular adenopathy. There Is no inguinal adenopathy. Abdomen is obese without hepatosplenomegaly. Bowel sounds are diminished. Heart shows a regular rhythm and rate without murmur rub or gallop. The right breast is negative for any masses. The left breast as described above. She has date changes on her skin of the left breast and inferiorly for several centimeters of an old ecchymosis. Heart shows no murmur rub or gallop. She is a regular rhythm and rate. Facial symmetry is intact. Teeth are in very good repair. Tongue is normal and in the midline. She has no leg edema or arm edema. She does have some varicosities in both legs slightly more the left than the right she has mild heme pigmentation anteriorly on both lower legs.  This lady has a T1 C. N0 tumor that's very small but ER positive and PR negative. She has a very low Ki-67 marker but it is grade 2. I think she should be considered for both radiation therapy and 5 years of hormonal therapy. We discussed the hormone  therapy briefly I want her to see Dr. Thersa Salt consultation finish her radiation therapy and then come back to discuss hormonal therapy. She is accompanied today by one of her good friends and neighbors who is also my patient and he was also taking Arimidex and the patient was when he was she will be placed in the same drug which is certainly feasible to start with.  I will see her in approximately 8 weeks. We work on getting her an appointment Dr. Thersa Salt as soon as possible. I also  wonder see Dr. Lovell Sheehan again about the incision site been slightly red and tender still.

## 2012-09-27 DIAGNOSIS — Z510 Encounter for antineoplastic radiation therapy: Secondary | ICD-10-CM | POA: Diagnosis not present

## 2012-09-27 DIAGNOSIS — Z882 Allergy status to sulfonamides status: Secondary | ICD-10-CM | POA: Diagnosis not present

## 2012-09-27 DIAGNOSIS — Z9889 Other specified postprocedural states: Secondary | ICD-10-CM | POA: Diagnosis not present

## 2012-09-27 DIAGNOSIS — Z170 Estrogen receptor positive status [ER+]: Secondary | ICD-10-CM | POA: Diagnosis not present

## 2012-09-27 DIAGNOSIS — Z883 Allergy status to other anti-infective agents status: Secondary | ICD-10-CM | POA: Diagnosis not present

## 2012-09-27 DIAGNOSIS — M129 Arthropathy, unspecified: Secondary | ICD-10-CM | POA: Diagnosis not present

## 2012-09-27 DIAGNOSIS — Z8542 Personal history of malignant neoplasm of other parts of uterus: Secondary | ICD-10-CM | POA: Diagnosis not present

## 2012-09-27 DIAGNOSIS — C50919 Malignant neoplasm of unspecified site of unspecified female breast: Secondary | ICD-10-CM | POA: Diagnosis not present

## 2012-09-27 DIAGNOSIS — Z79899 Other long term (current) drug therapy: Secondary | ICD-10-CM | POA: Diagnosis not present

## 2012-09-28 DIAGNOSIS — Z170 Estrogen receptor positive status [ER+]: Secondary | ICD-10-CM | POA: Diagnosis not present

## 2012-09-28 DIAGNOSIS — Z8542 Personal history of malignant neoplasm of other parts of uterus: Secondary | ICD-10-CM | POA: Diagnosis not present

## 2012-09-28 DIAGNOSIS — Z510 Encounter for antineoplastic radiation therapy: Secondary | ICD-10-CM | POA: Diagnosis not present

## 2012-09-28 DIAGNOSIS — Z79899 Other long term (current) drug therapy: Secondary | ICD-10-CM | POA: Diagnosis not present

## 2012-09-28 DIAGNOSIS — C50919 Malignant neoplasm of unspecified site of unspecified female breast: Secondary | ICD-10-CM | POA: Diagnosis not present

## 2012-09-28 DIAGNOSIS — M129 Arthropathy, unspecified: Secondary | ICD-10-CM | POA: Diagnosis not present

## 2012-09-29 DIAGNOSIS — Z170 Estrogen receptor positive status [ER+]: Secondary | ICD-10-CM | POA: Diagnosis not present

## 2012-09-29 DIAGNOSIS — Z510 Encounter for antineoplastic radiation therapy: Secondary | ICD-10-CM | POA: Diagnosis not present

## 2012-09-29 DIAGNOSIS — C50919 Malignant neoplasm of unspecified site of unspecified female breast: Secondary | ICD-10-CM | POA: Diagnosis not present

## 2012-09-29 DIAGNOSIS — Z8542 Personal history of malignant neoplasm of other parts of uterus: Secondary | ICD-10-CM | POA: Diagnosis not present

## 2012-09-29 DIAGNOSIS — M129 Arthropathy, unspecified: Secondary | ICD-10-CM | POA: Diagnosis not present

## 2012-09-29 DIAGNOSIS — Z79899 Other long term (current) drug therapy: Secondary | ICD-10-CM | POA: Diagnosis not present

## 2012-09-30 DIAGNOSIS — E785 Hyperlipidemia, unspecified: Secondary | ICD-10-CM | POA: Diagnosis not present

## 2012-09-30 DIAGNOSIS — K219 Gastro-esophageal reflux disease without esophagitis: Secondary | ICD-10-CM | POA: Diagnosis not present

## 2012-09-30 DIAGNOSIS — C50919 Malignant neoplasm of unspecified site of unspecified female breast: Secondary | ICD-10-CM | POA: Diagnosis not present

## 2012-10-06 DIAGNOSIS — Z510 Encounter for antineoplastic radiation therapy: Secondary | ICD-10-CM | POA: Diagnosis not present

## 2012-10-06 DIAGNOSIS — M129 Arthropathy, unspecified: Secondary | ICD-10-CM | POA: Diagnosis not present

## 2012-10-06 DIAGNOSIS — Z8542 Personal history of malignant neoplasm of other parts of uterus: Secondary | ICD-10-CM | POA: Diagnosis not present

## 2012-10-06 DIAGNOSIS — Z79899 Other long term (current) drug therapy: Secondary | ICD-10-CM | POA: Diagnosis not present

## 2012-10-06 DIAGNOSIS — Z170 Estrogen receptor positive status [ER+]: Secondary | ICD-10-CM | POA: Diagnosis not present

## 2012-10-06 DIAGNOSIS — C50919 Malignant neoplasm of unspecified site of unspecified female breast: Secondary | ICD-10-CM | POA: Diagnosis not present

## 2012-10-11 DIAGNOSIS — Z510 Encounter for antineoplastic radiation therapy: Secondary | ICD-10-CM | POA: Diagnosis not present

## 2012-10-11 DIAGNOSIS — Z79899 Other long term (current) drug therapy: Secondary | ICD-10-CM | POA: Diagnosis not present

## 2012-10-11 DIAGNOSIS — C50919 Malignant neoplasm of unspecified site of unspecified female breast: Secondary | ICD-10-CM | POA: Diagnosis not present

## 2012-10-11 DIAGNOSIS — Z8542 Personal history of malignant neoplasm of other parts of uterus: Secondary | ICD-10-CM | POA: Diagnosis not present

## 2012-10-11 DIAGNOSIS — M129 Arthropathy, unspecified: Secondary | ICD-10-CM | POA: Diagnosis not present

## 2012-10-11 DIAGNOSIS — Z170 Estrogen receptor positive status [ER+]: Secondary | ICD-10-CM | POA: Diagnosis not present

## 2012-10-12 DIAGNOSIS — Z8542 Personal history of malignant neoplasm of other parts of uterus: Secondary | ICD-10-CM | POA: Diagnosis not present

## 2012-10-12 DIAGNOSIS — Z510 Encounter for antineoplastic radiation therapy: Secondary | ICD-10-CM | POA: Diagnosis not present

## 2012-10-12 DIAGNOSIS — M129 Arthropathy, unspecified: Secondary | ICD-10-CM | POA: Diagnosis not present

## 2012-10-12 DIAGNOSIS — Z79899 Other long term (current) drug therapy: Secondary | ICD-10-CM | POA: Diagnosis not present

## 2012-10-12 DIAGNOSIS — C50919 Malignant neoplasm of unspecified site of unspecified female breast: Secondary | ICD-10-CM | POA: Diagnosis not present

## 2012-10-12 DIAGNOSIS — Z170 Estrogen receptor positive status [ER+]: Secondary | ICD-10-CM | POA: Diagnosis not present

## 2012-10-13 DIAGNOSIS — Z79899 Other long term (current) drug therapy: Secondary | ICD-10-CM | POA: Diagnosis not present

## 2012-10-13 DIAGNOSIS — M129 Arthropathy, unspecified: Secondary | ICD-10-CM | POA: Diagnosis not present

## 2012-10-13 DIAGNOSIS — Z510 Encounter for antineoplastic radiation therapy: Secondary | ICD-10-CM | POA: Diagnosis not present

## 2012-10-13 DIAGNOSIS — C50919 Malignant neoplasm of unspecified site of unspecified female breast: Secondary | ICD-10-CM | POA: Diagnosis not present

## 2012-10-13 DIAGNOSIS — Z8542 Personal history of malignant neoplasm of other parts of uterus: Secondary | ICD-10-CM | POA: Diagnosis not present

## 2012-10-13 DIAGNOSIS — Z170 Estrogen receptor positive status [ER+]: Secondary | ICD-10-CM | POA: Diagnosis not present

## 2012-10-14 DIAGNOSIS — Z170 Estrogen receptor positive status [ER+]: Secondary | ICD-10-CM | POA: Diagnosis not present

## 2012-10-14 DIAGNOSIS — Z79899 Other long term (current) drug therapy: Secondary | ICD-10-CM | POA: Diagnosis not present

## 2012-10-14 DIAGNOSIS — M129 Arthropathy, unspecified: Secondary | ICD-10-CM | POA: Diagnosis not present

## 2012-10-14 DIAGNOSIS — Z510 Encounter for antineoplastic radiation therapy: Secondary | ICD-10-CM | POA: Diagnosis not present

## 2012-10-14 DIAGNOSIS — Z8542 Personal history of malignant neoplasm of other parts of uterus: Secondary | ICD-10-CM | POA: Diagnosis not present

## 2012-10-14 DIAGNOSIS — C50919 Malignant neoplasm of unspecified site of unspecified female breast: Secondary | ICD-10-CM | POA: Diagnosis not present

## 2012-10-15 DIAGNOSIS — Z170 Estrogen receptor positive status [ER+]: Secondary | ICD-10-CM | POA: Diagnosis not present

## 2012-10-15 DIAGNOSIS — M129 Arthropathy, unspecified: Secondary | ICD-10-CM | POA: Diagnosis not present

## 2012-10-15 DIAGNOSIS — C50919 Malignant neoplasm of unspecified site of unspecified female breast: Secondary | ICD-10-CM | POA: Diagnosis not present

## 2012-10-15 DIAGNOSIS — Z510 Encounter for antineoplastic radiation therapy: Secondary | ICD-10-CM | POA: Diagnosis not present

## 2012-10-15 DIAGNOSIS — Z79899 Other long term (current) drug therapy: Secondary | ICD-10-CM | POA: Diagnosis not present

## 2012-10-15 DIAGNOSIS — Z8542 Personal history of malignant neoplasm of other parts of uterus: Secondary | ICD-10-CM | POA: Diagnosis not present

## 2012-10-18 DIAGNOSIS — C50919 Malignant neoplasm of unspecified site of unspecified female breast: Secondary | ICD-10-CM | POA: Diagnosis not present

## 2012-10-18 DIAGNOSIS — Z170 Estrogen receptor positive status [ER+]: Secondary | ICD-10-CM | POA: Diagnosis not present

## 2012-10-18 DIAGNOSIS — Z79899 Other long term (current) drug therapy: Secondary | ICD-10-CM | POA: Diagnosis not present

## 2012-10-18 DIAGNOSIS — Z8542 Personal history of malignant neoplasm of other parts of uterus: Secondary | ICD-10-CM | POA: Diagnosis not present

## 2012-10-18 DIAGNOSIS — Z510 Encounter for antineoplastic radiation therapy: Secondary | ICD-10-CM | POA: Diagnosis not present

## 2012-10-18 DIAGNOSIS — M129 Arthropathy, unspecified: Secondary | ICD-10-CM | POA: Diagnosis not present

## 2012-10-19 DIAGNOSIS — C50919 Malignant neoplasm of unspecified site of unspecified female breast: Secondary | ICD-10-CM | POA: Diagnosis not present

## 2012-10-19 DIAGNOSIS — Z8542 Personal history of malignant neoplasm of other parts of uterus: Secondary | ICD-10-CM | POA: Diagnosis not present

## 2012-10-19 DIAGNOSIS — Z79899 Other long term (current) drug therapy: Secondary | ICD-10-CM | POA: Diagnosis not present

## 2012-10-19 DIAGNOSIS — Z170 Estrogen receptor positive status [ER+]: Secondary | ICD-10-CM | POA: Diagnosis not present

## 2012-10-19 DIAGNOSIS — M129 Arthropathy, unspecified: Secondary | ICD-10-CM | POA: Diagnosis not present

## 2012-10-19 DIAGNOSIS — Z510 Encounter for antineoplastic radiation therapy: Secondary | ICD-10-CM | POA: Diagnosis not present

## 2012-10-20 DIAGNOSIS — C50919 Malignant neoplasm of unspecified site of unspecified female breast: Secondary | ICD-10-CM | POA: Diagnosis not present

## 2012-10-20 DIAGNOSIS — M129 Arthropathy, unspecified: Secondary | ICD-10-CM | POA: Diagnosis not present

## 2012-10-20 DIAGNOSIS — Z79899 Other long term (current) drug therapy: Secondary | ICD-10-CM | POA: Diagnosis not present

## 2012-10-20 DIAGNOSIS — Z170 Estrogen receptor positive status [ER+]: Secondary | ICD-10-CM | POA: Diagnosis not present

## 2012-10-20 DIAGNOSIS — Z510 Encounter for antineoplastic radiation therapy: Secondary | ICD-10-CM | POA: Diagnosis not present

## 2012-10-20 DIAGNOSIS — Z8542 Personal history of malignant neoplasm of other parts of uterus: Secondary | ICD-10-CM | POA: Diagnosis not present

## 2012-10-21 DIAGNOSIS — C50919 Malignant neoplasm of unspecified site of unspecified female breast: Secondary | ICD-10-CM | POA: Diagnosis not present

## 2012-10-21 DIAGNOSIS — Z510 Encounter for antineoplastic radiation therapy: Secondary | ICD-10-CM | POA: Diagnosis not present

## 2012-10-26 DIAGNOSIS — C50919 Malignant neoplasm of unspecified site of unspecified female breast: Secondary | ICD-10-CM | POA: Diagnosis not present

## 2012-11-02 DIAGNOSIS — C50919 Malignant neoplasm of unspecified site of unspecified female breast: Secondary | ICD-10-CM | POA: Diagnosis not present

## 2012-11-09 DIAGNOSIS — C50919 Malignant neoplasm of unspecified site of unspecified female breast: Secondary | ICD-10-CM | POA: Diagnosis not present

## 2012-11-16 DIAGNOSIS — C50919 Malignant neoplasm of unspecified site of unspecified female breast: Secondary | ICD-10-CM | POA: Diagnosis not present

## 2012-11-17 DIAGNOSIS — C50919 Malignant neoplasm of unspecified site of unspecified female breast: Secondary | ICD-10-CM | POA: Diagnosis not present

## 2012-11-22 DIAGNOSIS — C50919 Malignant neoplasm of unspecified site of unspecified female breast: Secondary | ICD-10-CM | POA: Diagnosis not present

## 2012-11-22 DIAGNOSIS — Z510 Encounter for antineoplastic radiation therapy: Secondary | ICD-10-CM | POA: Diagnosis not present

## 2012-11-23 DIAGNOSIS — C50919 Malignant neoplasm of unspecified site of unspecified female breast: Secondary | ICD-10-CM | POA: Diagnosis not present

## 2012-11-23 DIAGNOSIS — Z510 Encounter for antineoplastic radiation therapy: Secondary | ICD-10-CM | POA: Diagnosis not present

## 2012-11-24 ENCOUNTER — Encounter (HOSPITAL_COMMUNITY): Payer: Medicare Other | Attending: Oncology | Admitting: Oncology

## 2012-11-24 ENCOUNTER — Encounter (HOSPITAL_COMMUNITY): Payer: Self-pay | Admitting: Oncology

## 2012-11-24 VITALS — BP 118/70 | HR 80 | Temp 97.9°F | Resp 16 | Wt 183.2 lb

## 2012-11-24 DIAGNOSIS — C50419 Malignant neoplasm of upper-outer quadrant of unspecified female breast: Secondary | ICD-10-CM | POA: Diagnosis not present

## 2012-11-24 DIAGNOSIS — Z510 Encounter for antineoplastic radiation therapy: Secondary | ICD-10-CM | POA: Diagnosis not present

## 2012-11-24 DIAGNOSIS — Z8542 Personal history of malignant neoplasm of other parts of uterus: Secondary | ICD-10-CM

## 2012-11-24 DIAGNOSIS — Z170 Estrogen receptor positive status [ER+]: Secondary | ICD-10-CM | POA: Diagnosis not present

## 2012-11-24 DIAGNOSIS — C50919 Malignant neoplasm of unspecified site of unspecified female breast: Secondary | ICD-10-CM

## 2012-11-24 MED ORDER — ANASTROZOLE 1 MG PO TABS: ORAL_TABLET | ORAL | Status: DC

## 2012-11-24 NOTE — Addendum Note (Signed)
Addended by: Evelena Leyden on: 11/24/2012 02:09 PM   Modules accepted: Orders

## 2012-11-24 NOTE — Progress Notes (Signed)
#  1 stage I, grade 2, infiltrating ductal carcinoma left breast (T1 C., N0) status post biopsy followed by lumpectomy and sentinel lymph node biopsy. 5 sentinel nodes were found all of which were negative. Her cancer was ER +100%, PR 0%, HER-2/neu nonamplified. The tumor was 1.2 cm in size. Ki-67 marker was low at 7%. Margins were clear. She had definitive surgery on 08/18/2012. She is about to complete radiation therapy in the next 2 days. She did not use prior hormonal therapy. She was however never pregnant. She went to the menopause in her mid to late 26s. She will be started on Arimidex started 12/21/2012 for 5 years. If she tolerates it well she could possibly switched to tamoxifen at that point in time based upon the studies that will elucidate more about this disease by then. We went over the side effects of therapy. I don't want to restart until July 1 because she has so much burning and eructation from the radiation underneath the breast area in the inframammary crease and is very very tender. She also has significant erythema the left axilla.  #2 history of cancer of the uterus status post surgery, radiation therapy 1998 she does not have knowledge of the stage of her disease nor do I have access to those records.  #3 excess weight but she is working on this. She weighed as much as 210 pounds.  #4 history of bilateral cataract operations with lens implants #5 migraine headaches in the past #6 GERD  She really is having discomfort in the left breast is erythema post radiation especially in the inframammary fold with the skin is desquamating. She is tender.  We will therefore start the Arimidex in a few weeks and give her chance to heal. We will see her in 3 months to see a she is getting along. She is to call sooner if need be.

## 2012-11-24 NOTE — Patient Instructions (Addendum)
[facility] Discharge Instructions  RECOMMENDATIONS MADE BY THE CONSULTANT AND ANY TEST RESULTS WILL BE SENT TO YOUR REFERRING PHYSICIAN.  EXAM FINDINGS BY THE PHYSICIAN TODAY AND SIGNS OR SYMPTOMS TO REPORT TO CLINIC OR PRIMARY PHYSICIAN: Exam and discussion by MD.  Will get you started on Arimidex on July 1st and you will take it for 5 years.  MEDICATIONS PRESCRIBED:  Arimidex 1 mg take 1 pill daily - start on July 1st.  INSTRUCTIONS GIVEN AND DISCUSSED: Report any new lumps, bone pain, shortness of breath or other symptoms.  SPECIAL INSTRUCTIONS/FOLLOW-UP: Follow-up in 3 months.  Thank you for choosing [facility] to provide your oncology and hematology care.  To afford each patient quality time with our providers, please arrive at least 15 minutes before your scheduled appointment time.  With your help, our goal is to use those 15 minutes to complete the necessary work-up to ensure our physicians have the information they need to help with your evaluation and healthcare recommendations.    Effective January 1st, 2014, we ask that you re-schedule your appointment with our physicians should you arrive 10 or more minutes late for your appointment.  We strive to give you quality time with our providers, and arriving late affects you and other patients whose appointments are after yours.    Again, thank you for choosing [facility].  Our hope is that these requests will decrease the amount of time that you wait before being seen by our physicians.       _____________________________________________________________  Should you have questions after your visit to [facility], please contact our office at [phone] between the hours of 8:30 a.m. and 5:00 p.m.  Voicemails left after 4:30 p.m. will not be returned until the following business day.  For prescription refill requests, have your pharmacy contact our office with your  prescription refill request.     Anastrozole tablets What is this medicine? ANASTROZOLE (an AS troe zole) is used to treat breast cancer in women who have gone through menopause. Some types of breast cancer depend on estrogen to grow, and this medicine can stop tumor growth by blocking estrogen production. This medicine may be used for other purposes; ask your health care provider or pharmacist if you have questions. What should I tell my health care provider before I take this medicine? They need to know if you have any of these conditions: -liver disease -an unusual or allergic reaction to anastrozole, other medicines, foods, dyes, or preservatives -pregnant or trying to get pregnant -breast-feeding How should I use this medicine? Take this medicine by mouth with a glass of water. Follow the directions on the prescription label. You can take this medicine with or without food. Take your doses at regular intervals. Do not take your medicine more often than directed. Do not stop taking except on the advice of your doctor or health care professional. Talk to your pediatrician regarding the use of this medicine in children. Special care may be needed. Overdosage: If you think you have taken too much of this medicine contact a poison control center or emergency room at once. NOTE: This medicine is only for you. Do not share this medicine with others. What if I miss a dose? If you miss a dose, take it as soon as you can. If it is almost time for your next dose, take only that dose. Do not take double or extra doses. What may interact  with this medicine? Do not take this medicine with any of the following medications: -female hormones, like estrogens or progestins and birth control pills This medicine may also interact with the following medications: -tamoxifen This list may not describe all possible interactions. Give your health care provider a list of all the medicines, herbs, non-prescription  drugs, or dietary supplements you use. Also tell them if you smoke, drink alcohol, or use illegal drugs. Some items may interact with your medicine. What should I watch for while using this medicine? Visit your doctor or health care professional for regular checks on your progress. Let your doctor or health care professional know about any unusual vaginal bleeding. Do not treat yourself for diarrhea, nausea, vomiting or other side effects. Ask your doctor or health care professional for advice. What side effects may I notice from receiving this medicine? Side effects that you should report to your doctor or health care professional as soon as possible: -allergic reactions like skin rash, itching or hives, swelling of the face, lips, or tongue -any new or unusual symptoms -breathing problems -chest pain -leg pain or swelling -vomiting Side effects that usually do not require medical attention (report to your doctor or health care professional if they continue or are bothersome): -back or bone pain -cough, or throat infection -diarrhea or constipation -dizziness -headache -hot flashes -loss of appetite -nausea -sweating -weakness and tiredness -weight gain This list may not describe all possible side effects. Call your doctor for medical advice about side effects. You may report side effects to FDA at 1-800-FDA-1088. Where should I keep my medicine? Keep out of the reach of children. Store at room temperature between 20 and 25 degrees C (68 and 77 degrees F). Throw away any unused medicine after the expiration date. NOTE: This sheet is a summary. It may not cover all possible information. If you have questions about this medicine, talk to your doctor, pharmacist, or health care provider.  2013, [employer]vier/[employer] Standard. (08/20/2007 4:31:52 PM)

## 2012-11-25 DIAGNOSIS — C50919 Malignant neoplasm of unspecified site of unspecified female breast: Secondary | ICD-10-CM | POA: Diagnosis not present

## 2012-11-25 DIAGNOSIS — Z510 Encounter for antineoplastic radiation therapy: Secondary | ICD-10-CM | POA: Diagnosis not present

## 2012-11-26 DIAGNOSIS — C50919 Malignant neoplasm of unspecified site of unspecified female breast: Secondary | ICD-10-CM | POA: Diagnosis not present

## 2012-11-26 DIAGNOSIS — Z510 Encounter for antineoplastic radiation therapy: Secondary | ICD-10-CM | POA: Diagnosis not present

## 2012-12-01 ENCOUNTER — Ambulatory Visit (HOSPITAL_COMMUNITY)
Admission: RE | Admit: 2012-12-01 | Discharge: 2012-12-01 | Disposition: A | Discharge status: Home / Self Care | Payer: Medicare Other | Source: Ambulatory Visit | Attending: Oncology | Admitting: Oncology

## 2012-12-01 DIAGNOSIS — Z780 Asymptomatic menopausal state: Secondary | ICD-10-CM | POA: Diagnosis not present

## 2012-12-01 DIAGNOSIS — C50919 Malignant neoplasm of unspecified site of unspecified female breast: Secondary | ICD-10-CM

## 2012-12-10 ENCOUNTER — Telehealth (HOSPITAL_COMMUNITY): Payer: Self-pay

## 2012-12-10 NOTE — Telephone Encounter (Signed)
Normal

## 2012-12-10 NOTE — Telephone Encounter (Signed)
Patient notified that Bone Density was normal.

## 2012-12-10 NOTE — Telephone Encounter (Signed)
Had Bone Density on 12/01/12.  Would like to know results.

## 2012-12-28 DIAGNOSIS — Z923 Personal history of irradiation: Secondary | ICD-10-CM | POA: Diagnosis not present

## 2012-12-28 DIAGNOSIS — C50919 Malignant neoplasm of unspecified site of unspecified female breast: Secondary | ICD-10-CM | POA: Diagnosis not present

## 2013-02-14 DIAGNOSIS — M1990 Unspecified osteoarthritis, unspecified site: Secondary | ICD-10-CM | POA: Diagnosis not present

## 2013-02-21 DIAGNOSIS — M778 Other enthesopathies, not elsewhere classified: Secondary | ICD-10-CM

## 2013-02-21 HISTORY — DX: Other enthesopathies, not elsewhere classified: M77.8

## 2013-02-28 ENCOUNTER — Encounter (HOSPITAL_COMMUNITY): Payer: Medicare Other | Attending: Hematology and Oncology

## 2013-02-28 ENCOUNTER — Encounter (HOSPITAL_COMMUNITY): Payer: Self-pay

## 2013-02-28 VITALS — BP 126/78 | HR 64 | Temp 98.0°F | Resp 16 | Ht 65.0 in | Wt 186.4 lb

## 2013-02-28 DIAGNOSIS — C50419 Malignant neoplasm of upper-outer quadrant of unspecified female breast: Secondary | ICD-10-CM | POA: Diagnosis not present

## 2013-02-28 DIAGNOSIS — C50412 Malignant neoplasm of upper-outer quadrant of left female breast: Secondary | ICD-10-CM

## 2013-02-28 DIAGNOSIS — C50919 Malignant neoplasm of unspecified site of unspecified female breast: Secondary | ICD-10-CM | POA: Insufficient documentation

## 2013-02-28 NOTE — Progress Notes (Signed)
[facility] Telephone:([phone]   Fax:([phone]) [phone]  OFFICE PROGRESS NOTE  Kayla Perches, MD [address] [place] [address]  DIAGNOSIS: Stage I, grade 2, infiltrating ductal carcinoma left breast (T1 C., N0)   INTERVAL HISTORY:   Kayla Thomas was diagnosed with invasive ductal carcinoma of the left breast on 07/28/2012. Tumor was Estrogen Receptor 100%,Progesterone Receptor  NEGATIVE, Ki67 by M IB-1 (Low<20%): 7% Her-2-neu not amplified by [facility].  She had lumpectomy with axillary lymph node biopsy on 08/18/2012,  pathology showed; 1. Lymph node, sentinel, biopsy, left axilla - FIVE BENIGN LYMPH NODES (0/5). 2. Breast, partial mastectomy, left breast mass - INVASIVE DUCTAL CARCINOMA, 1.2 CM. - MARGINS NOT INVOLVED. - NO ANGIOLYMPHATIC INVASION IDENTIFIED. TNM: pT1c, pN0, pMX. She went on to complete radiation from 10/12/2012 to  11/26/2012. She did develop some moderate erythema and desquamation of the skin otherwise tolerated the radiation well. She began Arimidex in July of 2014 according to this EHR 12/21/2012.  Baseline DEXA 11/2012 scan shows normal bone mineral density. She returns to the clinic today for  Scheduled follow up with her friend Kayla Thomas and  tells me she has no new problem.  She tells me that her fatigue is improving she is tolerating Arimidex very well except for hot flashes which are not intolerable.  She denies anemia lumps and denies any arthralgia or aches or pains.   MEDICAL HISTORY: Past Medical History  Diagnosis Date  . Uterine cancer 1998  . Chronic back pain   . GERD (gastroesophageal reflux disease) 01/21/2006    EGD Dr Darrick Penna mild chronic gastritis, (NO h pylori) otherwise normal  . Sinus congestion   . Headache(784.0)   . Breast cancer     ALLERGIES:  is allergic to aspirin; iron; sulfa antibiotics; and ancef.  MEDICATIONS:  Current Outpatient Prescriptions  Medication Sig Dispense Refill  .  anastrozole (ARIMIDEX) 1 MG tablet To start July 1,2014  90 tablet  2  . B Complex-C (SUPER B COMPLEX PO) Take 1 tablet by mouth every morning.       . cholecalciferol (VITAMIN D) 1000 UNITS tablet Take 1,000 Units by mouth every morning.       . Diphenhydramine-PSE-APAP (ALLERGY/SINUS HEADACHE PO) Take 1 tablet by mouth as needed. For headaches      . ipratropium (ATROVENT) 0.03 % nasal spray Place 2 sprays into the nose every 12 (twelve) hours.      Marland Kitchen loperamide (IMODIUM) 2 MG capsule Take 2 mg by mouth 4 (four) times daily as needed. For diarrhea      . LUTEIN PO Take 45 mg by mouth daily.       . Multiple Vitamins-Minerals (MULTIVITAMINS THER. W/MINERALS) TABS Take 1 tablet by mouth every morning.       Marland Kitchen omeprazole (PRILOSEC) 20 MG capsule Take 20 mg by mouth every morning.       No current facility-administered medications for this visit.    SURGICAL HISTORY:  Past Surgical History  Procedure Laterality Date  . Cholecystectomy  11/1999  . S/p hysterectomy  1998    uterine ca  . Temperal/mandibular  1974  . Esophagogastroduodenoscopy  01/21/06    mild antral erythema bx h-pylori/normal esophagus without evidence of mass or Barrett's/normal pylorus and duodenum  . Cataracts    . Colonoscopy  05/13/2011    Procedure: COLONOSCOPY;  Surgeon: Arlyce Harman, MD;  Location: [facility];  Service: Endoscopy;  Laterality:  N/A;  8:30  . Tonsillectomy  1964  . Abscess drainage  03-04-05    insect bite  . Cataract extraction w/phaco  10/06/2011    Procedure: CATARACT EXTRACTION PHACO AND INTRAOCULAR LENS PLACEMENT (IOC);  Surgeon: Susa Simmonds, MD;  Location: [facility];  Service: Ophthalmology;  Laterality: Left;  CDE:  5.76  . Abdominal hysterectomy    . Partial mastectomy with needle localization and axillary sentinel lymph node bx Left 08/18/2012    Procedure: PARTIAL MASTECTOMY WITH NEEDLE LOCALIZATION AND AXILLARY SENTINEL LYMPH NODE BX;  Surgeon: [name], MD;  Location: [facility];   Service: General;  Laterality: Left;  Need Frozen Section/Sentinel Node Bx @ 8:00am/Needle Loc @ 9:00am     REVIEW OF SYSTEMS: 14 point review of system is as in the history above otherwise negative.  PHYSICAL EXAMINATION:  Blood pressure 126/78, pulse 64, temperature 98 F (36.7 C), temperature source Oral, resp. rate 16, height 5\' 5"  (1.651 m), weight 186 lb 6.4 oz (84.55 kg). GENERAL: No acute distress. SKIN:  No rashes or significant lesions . No ecchymosis or petechial rash. HEAD: Normocephalic, No masses, lesions, tenderness or abnormalities  EYES: Conjunctiva are pink and non-injected and no jaundice LYMPH: No palpable lymphadenopathy, in the neck, supraclavicular areas or axilla.  BREAST:Normal right breast without mass, left breast has mild lymphedema otherwise was negative for any palpable mass or significant tenderness. Well healed lumpectomy and axillary scars noted on the left. LUNGS: Clear to auscultation , no crackles or wheezes HEART: regular rate & rhythm, no murmurs, no gallops, S1 normal and S2 normal and no S3. ABDOMEN: Abdomen soft, non-tender, no masses or organomegaly and no hepatosplenomegaly palpable EXTREMITIES: No edema, no skin discoloration or tenderness NEURO: Alert & oriented .    LABORATORY DATA: Lab Results  Component Value Date   WBC 6.8 08/13/2012   HGB 14.6 08/13/2012   HCT 42.0 08/13/2012   MCV 91.9 08/13/2012   PLT 211 08/13/2012      Chemistry      Component Value Date/Time   NA 139 08/13/2012 0915   K 4.7 08/13/2012 0915   CL 102 08/13/2012 0915   CO2 27 08/13/2012 0915   BUN 21 08/13/2012 0915   CREATININE 0.80 08/13/2012 0915      Component Value Date/Time   CALCIUM 9.6 08/13/2012 0915   ALKPHOS 124* 08/13/2012 0915   AST 16 08/13/2012 0915   ALT 16 08/13/2012 0915   BILITOT 0.4 08/13/2012 0915       RADIOGRAPHIC STUDIES: No results found.   ASSESSMENT:  Kayla Thomas has no evidence of disease at this time.  She has mild left breast  lymphedema, otherwise she is tolerating Arimidex well.  PLAN:  1. Continue Arimidex for total of 5 years which will be in July of 2020. 2. Mammogram next February 2015. 3. Return to clinic in 3 months. 4. I recommended that calcium/vitamin D 600/400 1 tab bid.      All questions were satisfactorily answered. Patient knows to call if  any concern arises.  I spent more than 50 % counseling the patient face to face. The total time spent in the appointment was 30 minutes.   Sherral Hammers, MD FACP. Hematology/Oncology.

## 2013-02-28 NOTE — Patient Instructions (Addendum)
[facility] Discharge Instructions  RECOMMENDATIONS MADE BY THE CONSULTANT AND ANY TEST RESULTS WILL BE SENT TO YOUR REFERRING PHYSICIAN.  Mammogram due February 2015. We recommend for you to take Calcium 1200 mg total and Vitamin D 800 mg total daily. You can usually buy an OTC calcium/vitamin D pill that has 600 mg/400 mg and you would take 1 tablet two times daily. Report any issues/concerns to clinic as needed. Return to clinic in 3 months for follow up.  Thank you for choosing [facility] to provide your oncology and hematology care.  To afford each patient quality time with our providers, please arrive at least 15 minutes before your scheduled appointment time.  With your help, our goal is to use those 15 minutes to complete the necessary work-up to ensure our physicians have the information they need to help with your evaluation and healthcare recommendations.    Effective January 1st, 2014, we ask that you re-schedule your appointment with our physicians should you arrive 10 or more minutes late for your appointment.  We strive to give you quality time with our providers, and arriving late affects you and other patients whose appointments are after yours.    Again, thank you for choosing [facility].  Our hope is that these requests will decrease the amount of time that you wait before being seen by our physicians.       _____________________________________________________________  Should you have questions after your visit to [facility], please contact our office at [phone] between the hours of 8:30 a.m. and 5:00 p.m.  Voicemails left after 4:30 p.m. will not be returned until the following business day.  For prescription refill requests, have your pharmacy contact our office with your prescription refill request.

## 2013-03-17 DIAGNOSIS — M19049 Primary osteoarthritis, unspecified hand: Secondary | ICD-10-CM | POA: Diagnosis not present

## 2013-03-17 DIAGNOSIS — M654 Radial styloid tenosynovitis [de Quervain]: Secondary | ICD-10-CM | POA: Diagnosis not present

## 2013-04-14 DIAGNOSIS — M654 Radial styloid tenosynovitis [de Quervain]: Secondary | ICD-10-CM | POA: Diagnosis not present

## 2013-05-12 DIAGNOSIS — M654 Radial styloid tenosynovitis [de Quervain]: Secondary | ICD-10-CM | POA: Diagnosis not present

## 2013-05-31 ENCOUNTER — Encounter (HOSPITAL_COMMUNITY): Payer: Medicare Other | Attending: Hematology and Oncology

## 2013-05-31 ENCOUNTER — Encounter (HOSPITAL_COMMUNITY): Payer: Self-pay

## 2013-05-31 VITALS — BP 106/61 | HR 60 | Temp 97.1°F | Resp 16 | Wt 188.1 lb

## 2013-05-31 DIAGNOSIS — Z170 Estrogen receptor positive status [ER+]: Secondary | ICD-10-CM | POA: Diagnosis not present

## 2013-05-31 DIAGNOSIS — Z8542 Personal history of malignant neoplasm of other parts of uterus: Secondary | ICD-10-CM | POA: Insufficient documentation

## 2013-05-31 DIAGNOSIS — N959 Unspecified menopausal and perimenopausal disorder: Secondary | ICD-10-CM | POA: Diagnosis not present

## 2013-05-31 DIAGNOSIS — C50412 Malignant neoplasm of upper-outer quadrant of left female breast: Secondary | ICD-10-CM

## 2013-05-31 DIAGNOSIS — C50419 Malignant neoplasm of upper-outer quadrant of unspecified female breast: Secondary | ICD-10-CM

## 2013-05-31 LAB — CBC WITH DIFFERENTIAL/PLATELET
Basophils Relative: 0 % (ref 0–1)
Eosinophils Absolute: 0.2 10*3/uL (ref 0.0–0.7)
Eosinophils Relative: 3 % (ref 0–5)
Hemoglobin: 14.4 g/dL (ref 12.0–15.0)
Lymphocytes Relative: 17 % (ref 12–46)
MCH: 32.3 pg (ref 26.0–34.0)
MCHC: 34.5 g/dL (ref 30.0–36.0)
MCV: 93.5 fL (ref 78.0–100.0)
Monocytes Absolute: 0.6 10*3/uL (ref 0.1–1.0)
Monocytes Relative: 9 % (ref 3–12)
Neutrophils Relative %: 72 % (ref 43–77)
RBC: 4.46 MIL/uL (ref 3.87–5.11)
WBC: 6.4 10*3/uL (ref 4.0–10.5)

## 2013-05-31 LAB — COMPREHENSIVE METABOLIC PANEL
ALT: 16 U/L (ref 0–35)
Albumin: 3.9 g/dL (ref 3.5–5.2)
BUN: 15 mg/dL (ref 6–23)
CO2: 29 mEq/L (ref 19–32)
Calcium: 9.8 mg/dL (ref 8.4–10.5)
Creatinine, Ser: 0.76 mg/dL (ref 0.50–1.10)
GFR calc Af Amer: >90 mL/min (ref >90)
GFR calc non Af Amer: 82 mL/min — ABNORMAL LOW (ref >90)
Glucose, Bld: 91 mg/dL (ref 70–99)
Potassium: 4.5 mEq/L (ref 3.5–5.1)
Sodium: 139 mEq/L (ref 135–145)
Total Bilirubin: 0.4 mg/dL (ref 0.3–1.2)
Total Protein: 6.9 g/dL (ref 6.0–8.3)

## 2013-05-31 MED ORDER — VENLAFAXINE HCL 37.5 MG PO TABS: 37.5000 mg | ORAL_TABLET | ORAL | Status: DC

## 2013-05-31 NOTE — Patient Instructions (Signed)
[facility] Discharge Instructions  RECOMMENDATIONS MADE BY THE CONSULTANT AND ANY TEST RESULTS WILL BE SENT TO YOUR REFERRING PHYSICIAN.  EXAM FINDINGS BY THE PHYSICIAN TODAY AND SIGNS OR SYMPTOMS TO REPORT TO CLINIC OR PRIMARY PHYSICIAN: Exam and findings as discussed by Dr. Zigmund Daniel.  Will give you something for your hot flashes.  Report any new lumps, bone pain, shortness of breath or other symptoms.  MEDICATIONS PRESCRIBED:  Effexor 37.5 mg at bedtime - if needed after 1 week you can increase it to 2 at bedtime.  If you increase it let us know so we can order 75mg  dosage  INSTRUCTIONS/FOLLOW-UP: 6 months for labs and follow-up.  Thank you for choosing [facility] to provide your oncology and hematology care.  To afford each patient quality time with our providers, please arrive at least 15 minutes before your scheduled appointment time.  With your help, our goal is to use those 15 minutes to complete the necessary work-up to ensure our physicians have the information they need to help with your evaluation and healthcare recommendations.    Effective January 1st, 2014, we ask that you re-schedule your appointment with our physicians should you arrive 10 or more minutes late for your appointment.  We strive to give you quality time with our providers, and arriving late affects you and other patients whose appointments are after yours.    Again, thank you for choosing [facility].  Our hope is that these requests will decrease the amount of time that you wait before being seen by our physicians.       _____________________________________________________________  Should you have questions after your visit to [facility], please contact our office at [phone] between the hours of 8:30 a.m. and 5:00 p.m.  Voicemails left after 4:30 p.m. will not be returned until the following business day.  For prescription refill requests, have  your pharmacy contact our office with your prescription refill request.

## 2013-05-31 NOTE — Progress Notes (Signed)
[facility]  OFFICE PROGRESS Jonn Shingles, MD [address] [place] [address]  DIAGNOSIS: Malignant neoplasm of upper-outer quadrant of female breast, left - Plan: CBC with Differential, Comprehensive metabolic panel, CEA, Cancer antigen 27.29, CBC with Differential, Comprehensive metabolic panel, CEA, Cancer antigen 27.29, CBC with Differential, Comprehensive metabolic panel, CEA, Cancer antigen 27.29  UTERINE CANCER, HX OF - Plan: CBC with Differential, Comprehensive metabolic panel, CA 125, CBC with Differential, Comprehensive metabolic panel, CA 125  Chief Complaint  Patient presents with  . 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  INTERVAL HISTORY: Kayla Thomas 72 y.o. female returns for followup of stage I left breast cancer while taking anastrozole, status post partial mastectomy, sentinel node sampling, external beam radiotherapy, and currently taking anastrozole 1 mg daily since July of 2014. She also has a history of uterine cancer treated with surgery in 2010. Her primary problem is generalized achiness but she does exercise 3 days a week with water aerobics. She denies any vaginal bleeding, vaginal dryness, lower extremity swelling or redness, cough, wheezing, PND, orthopnea, palpitations, or persistent headache. She does suffer with hot flashes both day and night. She denies any nausea, vomiting, diarrhea, constipation, skin rash, or joint pain swelling.  MEDICAL HISTORY: Past Medical History  Diagnosis Date  . Uterine cancer 1998  . Chronic back pain   . GERD (gastroesophageal reflux disease) 01/21/2006    EGD Dr Darrick Penna mild chronic gastritis, (NO h pylori) otherwise normal  . Sinus congestion   . Headache(784.0)   . Breast cancer   . Thumb tendonitis September 2014    left    INTERIM HISTORY: has  MIGRAINE HEADACHE; GERD; GASTRITIS; LOOSE STOOLS; ALLERGY; UTERINE CANCER, HX OF; and Screen for colon cancer on her problem list.   invasive ductal carcinoma of the left breast on 07/28/2012. Tumor was Estrogen Receptor 100%,Progesterone Receptor NEGATIVE, Ki67 by M IB-1 (Low<20%): 7% Her-2-neu not amplified by FISH.  She had lumpectomy with axillary lymph node biopsy on 08/18/2012,  pathology showed;  1. Lymph node, sentinel, biopsy, left axilla  - FIVE BENIGN LYMPH NODES (0/5).  2. Breast, partial mastectomy, left breast mass  - INVASIVE DUCTAL CARCINOMA, 1.2 CM.  - MARGINS NOT INVOLVED.  - NO ANGIOLYMPHATIC INVASION IDENTIFIED.  TNM: pT1c, pN0, pMX.  She went on to complete radiation from 10/12/2012 to 11/26/2012. She did develop some moderate erythema and desquamation of the skin otherwise tolerated the radiation well.  She began Arimidex in July of 2014 according to this EHR 12/21/2012.  Baseline DEXA 11/2012 scan shows normal bone mineral density  ALLERGIES:  is allergic to aspirin; iron; sulfa antibiotics; and ancef.  MEDICATIONS: has a current medication list which includes the following prescription(s): anastrozole, b complex-c, calcium carb-cholecalciferol, diphenhydramine-pse-apap, ibuprofen, ipratropium, loperamide, lutein, multivitamins ther. w/minerals, omeprazole, and venlafaxine.  SURGICAL HISTORY:  Past Surgical History  Procedure Laterality Date  . Cholecystectomy  11/1999  . S/p hysterectomy  1998    uterine ca  . Temperal/mandibular  1974  . Esophagogastroduodenoscopy  01/21/06    mild antral erythema bx h-pylori/normal esophagus without evidence of mass or Barrett's/normal pylorus and duodenum  . Cataracts    . Colonoscopy  05/13/2011    Procedure: COLONOSCOPY;  Surgeon: Arlyce Harman, MD;  Location: [facility];  Service: Endoscopy;  Laterality:  N/A;  8:30  . Tonsillectomy  1964  . Abscess drainage  03-04-05    insect bite  . Cataract extraction w/phaco  10/06/2011     Procedure: CATARACT EXTRACTION PHACO AND INTRAOCULAR LENS PLACEMENT (IOC);  Surgeon: Susa Simmonds, MD;  Location: [facility] ORS;  Service: Ophthalmology;  Laterality: Left;  CDE:  5.76  . Abdominal hysterectomy    . Partial mastectomy with needle localization and axillary sentinel lymph node bx Left 08/18/2012    Procedure: PARTIAL MASTECTOMY WITH NEEDLE LOCALIZATION AND AXILLARY SENTINEL LYMPH NODE BX;  Surgeon: [name], MD;  Location: [facility] ORS;  Service: General;  Laterality: Left;  Need Frozen Section/Sentinel Node Bx @ 8:00am/Needle Loc @ 9:00am    FAMILY HISTORY: family history includes Alcohol abuse in her father; COPD in her mother; Hypertension in her mother; Liver cancer in her father.  SOCIAL HISTORY:  reports that she has never smoked. She has never used smokeless tobacco. She reports that she does not drink alcohol or use illicit drugs.  REVIEW OF SYSTEMS:  Other than that discussed above is noncontributory.  PHYSICAL EXAMINATION: ECOG PERFORMANCE STATUS: 1 - Symptomatic but completely ambulatory  Blood pressure 106/61, pulse 60, temperature 97.1 F (36.2 C), temperature source Oral, resp. rate 16, weight 188 lb 1.6 oz (85.322 kg).  GENERAL:alert, no distress and comfortable SKIN: skin color, texture, turgor are normal, no rashes or significant lesions EYES: PERLA; Conjunctiva are pink and non-injected, sclera clear OROPHARYNX:no exudate, no erythema on lips, buccal mucosa, or tongue. NECK: supple, thyroid normal size, non-tender, without nodularity. No masses CHEST: Status post left breast lumpectomy with induration from radiation. No masses in the right breast. LYMPH:  no palpable lymphadenopathy in the cervical, axillary or inguinal LUNGS: clear to auscultation and percussion with normal breathing effort HEART: regular rate & rhythm and no murmurs. ABDOMEN:abdomen soft, non-tender and normal bowel sounds MUSCULOSKELETAL:no cyanosis of digits and no clubbing. Range of  motion normal.  NEURO: alert & oriented x 3 with fluent speech, no focal motor/sensory deficits   LABORATORY DATA: Office Visit on 05/31/2013  Component Date Value Range Status  . WBC 05/31/2013 6.4  4.0 - 10.5 K/uL Final  . RBC 05/31/2013 4.46  3.87 - 5.11 MIL/uL Final  . Hemoglobin 05/31/2013 14.4  12.0 - 15.0 g/dL Final  . HCT 45/40/9811 41.7  36.0 - 46.0 % Final  . MCV 05/31/2013 93.5  78.0 - 100.0 fL Final  . MCH 05/31/2013 32.3  26.0 - 34.0 pg Final  . MCHC 05/31/2013 34.5  30.0 - 36.0 g/dL Final  . RDW 91/47/8295 12.3  11.5 - 15.5 % Final  . Platelets 05/31/2013 205  150 - 400 K/uL Final  . Neutrophils Relative % 05/31/2013 72  43 - 77 % Final  . Neutro Abs 05/31/2013 4.6  1.7 - 7.7 K/uL Final  . Lymphocytes Relative 05/31/2013 17  12 - 46 % Final  . Lymphs Abs 05/31/2013 1.1  0.7 - 4.0 K/uL Final  . Monocytes Relative 05/31/2013 9  3 - 12 % Final  . Monocytes Absolute 05/31/2013 0.6  0.1 - 1.0 K/uL Final  . Eosinophils Relative 05/31/2013 3  0 - 5 % Final  . Eosinophils Absolute 05/31/2013 0.2  0.0 - 0.7 K/uL Final  . Basophils Relative 05/31/2013 0  0 - 1 % Final  . Basophils Absolute 05/31/2013 0.0  0.0 - 0.1 K/uL Final  . Sodium 05/31/2013 139  135 - 145 mEq/L Final  . Potassium 05/31/2013 4.5  3.5 - 5.1 mEq/L Final  . Chloride 05/31/2013 101  96 - 112 mEq/L Final  . CO2 05/31/2013 29  19 - 32 mEq/L Final  . Glucose, Bld 05/31/2013 91  70 - 99 mg/dL Final  . BUN 16/03/9603 15  6 - 23 mg/dL Final  . Creatinine, Ser 05/31/2013 0.76  0.50 - 1.10 mg/dL Final  . Calcium 54/02/8118 9.8  8.4 - 10.5 mg/dL Final  . Total Protein 05/31/2013 6.9  6.0 - 8.3 g/dL Final  . Albumin 14/78/2956 3.9  3.5 - 5.2 g/dL Final  . AST 21/30/8657 19  0 - 37 U/L Final  . ALT 05/31/2013 16  0 - 35 U/L Final  . Alkaline Phosphatase 05/31/2013 105  39 - 117 U/L Final  . Total Bilirubin 05/31/2013 0.4  0.3 - 1.2 mg/dL Final  . GFR calc non Af Amer 05/31/2013 82* >90 mL/min Final  . GFR calc Af  Amer 05/31/2013 >90  >90 mL/min Final   Comment: (NOTE)                          The eGFR has been calculated using the CKD EPI equation.                          This calculation has not been validated in all clinical situations.                          eGFR's persistently <90 mL/min signify possible Chronic Kidney                          Disease.    PATHOLOGY: No new pathology.  Urinalysis No results found for this basename: colorurine,  appearanceur,  labspec,  phurine,  glucoseu,  hgbur,  bilirubinur,  ketonesur,  proteinur,  urobilinogen,  nitrite,  leukocytesur    RADIOGRAPHIC STUDIES: No results found.  ASSESSMENT:  #1.Invasive ductal carcinoma of the left breast on 07/28/2012. Tumor was Estrogen Receptor 100%,Progesterone Receptor NEGATIVE, Ki67 by M IB-1 (Low<20%): 7% Her-2-neu not amplified by FISH.  She had lumpectomy with axillary lymph node biopsy on 08/18/2012, status post radiotherapy currently taking anastrozole 1 mg daily with generalized achiness and hot flashes. #2. Vasomotor instability. #3. Gastroesophageal reflux disease. #4. History of cancer the uterus, no evidence of disease. #5. History of migraine syndrome.    PLAN:  #1. Continue anastrozole 1 mg daily along with thrice weekly water aerobics. #2. Effexor XR 37.5 mg up to 75 mg at bedtime for hot flashes. #3. Repeat mammogram in February 2015 per PCP. #4. Followup in 6 months with lab tests. Patient was told to call should any new problems occur that are troublesome and persistent.   All questions were answered. The patient knows to call the clinic with any problems, questions or concerns. We can certainly see the patient much sooner if necessary.   I spent 25 minutes counseling the patient face to face. The total time spent in the appointment was 30 minutes.    Maurilio Lovely, MD 05/31/2013 11:36 AM

## 2013-06-01 LAB — CA 125: CA 125: 5.6 U/mL (ref 0.0–30.2)

## 2013-06-01 LAB — CANCER ANTIGEN 27.29: CA 27.29: 15 U/mL (ref 0–39)

## 2013-06-01 LAB — CEA: CEA: <0.5 ng/mL (ref 0.0–5.0)

## 2013-08-15 DIAGNOSIS — M654 Radial styloid tenosynovitis [de Quervain]: Secondary | ICD-10-CM | POA: Diagnosis not present

## 2013-09-01 DIAGNOSIS — H35379 Puckering of macula, unspecified eye: Secondary | ICD-10-CM | POA: Diagnosis not present

## 2013-09-01 DIAGNOSIS — H3530 Unspecified macular degeneration: Secondary | ICD-10-CM | POA: Diagnosis not present

## 2013-09-01 DIAGNOSIS — Z961 Presence of intraocular lens: Secondary | ICD-10-CM | POA: Diagnosis not present

## 2013-09-08 ENCOUNTER — Encounter: Payer: Self-pay | Admitting: Obstetrics and Gynecology

## 2013-09-08 ENCOUNTER — Ambulatory Visit (INDEPENDENT_AMBULATORY_CARE_PROVIDER_SITE_OTHER): Payer: Medicare Other | Admitting: Obstetrics and Gynecology

## 2013-09-08 ENCOUNTER — Encounter (INDEPENDENT_AMBULATORY_CARE_PROVIDER_SITE_OTHER): Payer: Self-pay

## 2013-09-08 VITALS — BP 158/68 | Ht 64.0 in | Wt 189.8 lb

## 2013-09-08 DIAGNOSIS — Z8542 Personal history of malignant neoplasm of other parts of uterus: Secondary | ICD-10-CM | POA: Diagnosis not present

## 2013-09-08 DIAGNOSIS — Z853 Personal history of malignant neoplasm of breast: Secondary | ICD-10-CM

## 2013-09-08 DIAGNOSIS — Z0000 Encounter for general adult medical examination without abnormal findings: Secondary | ICD-10-CM

## 2013-09-08 NOTE — Progress Notes (Signed)
This chart was scribed by Ludger Nutting, Medical Scribe, for Dr. Mallory Shirk on 09/08/13 at 9:21 AM. This chart was reviewed by Dr. Mallory Shirk and is accurate.   Assessment:  Post menopausal Annual Gyn Exam, s/p hysterectomy  Post radiation changes to left breast    Plan:  1. return annually or prn 2.   Schedule bilateral diagnostic mammogram  Subjective:  Kayla Thomas is a 73 y.o. female No obstetric history on file. who presents for annual exam. No LMP recorded. Patient has had a hysterectomy. The patient has no complaints today.  The following portions of the patient's history were reviewed and updated as appropriate: allergies, current medications, past family history, past medical history, past social history, past surgical history and problem list.  Review of Systems Constitutional: negative Gastrointestinal: negative but occasional diarrhea  Genitourinary: negative   Objective:  BP 158/68  Ht 5\' 4"  (1.626 m)  Wt 189 lb 12.8 oz (86.093 kg)  BMI 32.56 kg/m2   BMI: Body mass index is 32.56 kg/(m^2).  General Appearance: Alert, appropriate appearance for age. No acute distress HEENT: Grossly normal Neck / Thyroid:  Cardiovascular: RRR; normal S1, S2, no murmur Lungs: CTA bilaterally Back: No CVAT Breast Exam: Right breast normal tissue, no masses, or nipple dimpling. Left breast has moderate erythema s/p radiation with nipple inversion. Paleness and some retraction of left nipple Gastrointestinal: Soft, non-tender, no masses or organomegaly Pelvic Exam: External genitalia: normal general appearance Vaginal: normal mucosa without prolapse or lesions and normal without tenderness, induration or masses Cervix: removed surgically Adnexa: normal bimanual exam Uterus: removed surgically Vagina: atrophic, stenotic, no masses, vaginal 5cm, smooth, small entrance  Rectovaginal: normal rectal, no masses, hemoccult stool negative Lymphatic Exam: Non-palpable nodes in neck,  clavicular, axillary, or inguinal regions Skin: no rash or abnormalities Neurologic: Normal gait and speech, no tremor  Psychiatric: Alert and oriented, appropriate affect.  Urinalysis:Not done  Mallory Shirk. MD Pgr [phone] 9:20 AM

## 2013-09-28 DIAGNOSIS — M654 Radial styloid tenosynovitis [de Quervain]: Secondary | ICD-10-CM | POA: Diagnosis not present

## 2013-10-03 DIAGNOSIS — K2100 Gastro-esophageal reflux disease with esophagitis, without bleeding: Secondary | ICD-10-CM | POA: Diagnosis not present

## 2013-10-03 DIAGNOSIS — Z79899 Other long term (current) drug therapy: Secondary | ICD-10-CM | POA: Diagnosis not present

## 2013-10-03 DIAGNOSIS — E785 Hyperlipidemia, unspecified: Secondary | ICD-10-CM | POA: Diagnosis not present

## 2013-10-03 DIAGNOSIS — G43009 Migraine without aura, not intractable, without status migrainosus: Secondary | ICD-10-CM | POA: Diagnosis not present

## 2013-10-10 DIAGNOSIS — Z0000 Encounter for general adult medical examination without abnormal findings: Secondary | ICD-10-CM | POA: Diagnosis not present

## 2013-10-11 ENCOUNTER — Encounter: Payer: Self-pay | Admitting: *Deleted

## 2013-10-11 ENCOUNTER — Other Ambulatory Visit: Payer: Self-pay | Admitting: Obstetrics and Gynecology

## 2013-10-11 ENCOUNTER — Telehealth: Payer: Self-pay | Admitting: Obstetrics and Gynecology

## 2013-10-11 ENCOUNTER — Encounter: Payer: Self-pay | Admitting: Cardiovascular Disease

## 2013-10-11 ENCOUNTER — Ambulatory Visit (INDEPENDENT_AMBULATORY_CARE_PROVIDER_SITE_OTHER): Payer: Medicare Other | Admitting: Cardiovascular Disease

## 2013-10-11 VITALS — BP 138/88 | HR 63 | Ht 63.0 in | Wt 189.0 lb

## 2013-10-11 DIAGNOSIS — R9431 Abnormal electrocardiogram [ECG] [EKG]: Secondary | ICD-10-CM

## 2013-10-11 DIAGNOSIS — R5381 Other malaise: Secondary | ICD-10-CM

## 2013-10-11 DIAGNOSIS — R0600 Dyspnea, unspecified: Secondary | ICD-10-CM

## 2013-10-11 DIAGNOSIS — R0609 Other forms of dyspnea: Secondary | ICD-10-CM | POA: Diagnosis not present

## 2013-10-11 DIAGNOSIS — R5383 Other fatigue: Secondary | ICD-10-CM

## 2013-10-11 DIAGNOSIS — R0989 Other specified symptoms and signs involving the circulatory and respiratory systems: Secondary | ICD-10-CM

## 2013-10-11 DIAGNOSIS — Z136 Encounter for screening for cardiovascular disorders: Secondary | ICD-10-CM | POA: Diagnosis not present

## 2013-10-11 DIAGNOSIS — C50919 Malignant neoplasm of unspecified site of unspecified female breast: Secondary | ICD-10-CM

## 2013-10-11 NOTE — Telephone Encounter (Signed)
Patient is 1 yr s/p diagnosis of Left Breast cancer. Radiation tx finished 26 November 2012.  P diagnostic Mammogram left breast ordered.

## 2013-10-11 NOTE — Patient Instructions (Signed)
Your physician has requested that you have an echocardiogram. Echocardiography is a painless test that uses sound waves to create images of your heart. It provides your doctor with information about the size and shape of your heart and how well your heart's chambers and valves are working. This procedure takes approximately one hour. There are no restrictions for this procedure. Your physician has requested that you have en exercise stress myoview. For further information please visit [email or web address]. Please follow instruction sheet, as given. Continue all current medications. Office will contact with results via phone or letter.   Follow up in  1 month - [place] office

## 2013-10-11 NOTE — Telephone Encounter (Signed)
Pt aware of appointment 

## 2013-10-11 NOTE — Progress Notes (Signed)
Patient ID: Kayla Thomas, female   DOB: 1941-03-09, 73 y.o.   MRN: [id number]       CARDIOLOGY CONSULT NOTE  Patient ID: Kayla Thomas MRN: [id number] DOB/AGE: Aug 10, 1940 73 y.o.  Admit date: (Not on file) Primary Physician Asencion Noble, MD  Reason for Consultation: DOE, fatigue  HPI: The patient is a 73 year old woman with a history of breast cancer status post left partial mastectomy and radiation therapy. She also has a history of endometrial carcinoma and GERD. Her primary care physician, Dr. Willey Blade, has noted that the patient has been becoming dyspneic primarily when climbing steps or walking up hills. She reportedly developed substantial fatigue but denies any chest pain. An ECG performed on April 20 showed normal sinus rhythm with a diffuse nonspecific ST segment and T wave abnormality. Recent CBC showed normal hemoglobin of 14.1, BMET showed normal renal function with BUN of 14 and creatinine 0.6, and lipid panel showed triglycerides 77, total cholesterol 200, HDL 69, and LDL 116. AST was 17 and ALT was 15.  She tells me that after she underwent surgery for breast cancer last year, she went with her son to [place]. [place] she [place] subway, she noted that she was short of breath and fatigued, and thought this may have been due to to her recent surgery. She denies any associated chest pain, palpitations, lightheadedness, and dizziness. She denies orthopnea and paroxysmal nocturnal dyspnea. She also denies leg swelling. She tries to stay active and works out in the pool doing water aerobics at [employer] 3 days a week.    Allergies  Allergen Reactions  . Aspirin Other (See Comments)    REACTION: Abdominal pain  . Iron     REACTION: abdominal pain  . Sulfa Antibiotics Rash    Rash all over  . Ancef [Cefazolin] Rash    Current Outpatient Prescriptions  Medication Sig Dispense Refill  . anastrozole (ARIMIDEX) 1 MG tablet Take 1 mg by mouth  daily. To start July 1,2014      . B Complex-C (SUPER B COMPLEX PO) Take 1 tablet by mouth every morning.       . Calcium Carb-Cholecalciferol (CALCIUM 600/VITAMIN D3) 600-800 MG-UNIT TABS Take 1 capsule by mouth 2 (two) times daily.      . Diphenhydramine-PSE-APAP (ALLERGY/SINUS HEADACHE PO) Take 1 tablet by mouth as needed. For headaches      . Glucosamine-Chondroitin (GLUCOSAMINE CHONDR COMPLEX PO) Take 1 tablet by mouth 2 (two) times daily.       Marland Kitchen ibuprofen (ADVIL,MOTRIN) 200 MG tablet Take by mouth 2 (two) times daily as needed. Takes 3 tablets      . ipratropium (ATROVENT) 0.03 % nasal spray Place 2 sprays into the nose every 12 (twelve) hours.      Marland Kitchen loperamide (IMODIUM) 2 MG capsule Take 2 mg by mouth 4 (four) times daily as needed. For diarrhea      . LUTEIN PO Take 45 mg by mouth daily.       . Multiple Vitamins-Minerals (MULTIVITAMINS THER. W/MINERALS) TABS Take 1 tablet by mouth every morning.       Marland Kitchen omeprazole (PRILOSEC) 20 MG capsule Take 20 mg by mouth every morning.       No current facility-administered medications for this visit.    Past Medical History  Diagnosis Date  . Uterine cancer 1998  . Chronic back pain   . GERD (gastroesophageal reflux disease) 01/21/2006    EGD Dr  Fields-> mild chronic gastritis, (NO h pylori) otherwise normal  . Sinus congestion   . Headache(784.0)   . Breast cancer   . Thumb tendonitis September 2014    left  . Breast cancer     Past Surgical History  Procedure Laterality Date  . Cholecystectomy  11/1999  . S/p hysterectomy  1998    uterine ca  . Temperal/mandibular  1974  . Esophagogastroduodenoscopy  01/21/06    mild antral erythema bx h-pylori/normal esophagus without evidence of mass or Barrett's/normal pylorus and duodenum  . Cataracts    . Colonoscopy  05/13/2011    Procedure: COLONOSCOPY;  Surgeon: Dorothyann Peng, MD;  Location[facility];  Service: Endoscopy;  Laterality: N/A;  8:30  . Tonsillectomy  1964  . Abscess  drainage  03-04-05    insect bite  . Cataract extraction w/phaco  10/06/2011    Procedure: CATARACT EXTRACTION PHACO AND INTRAOCULAR LENS PLACEMENT (IOC);  Surgeon: Williams Che, MD;  Location: [facility]S;  Service: Ophthalmology;  Laterality: Left;  CDE:  5.76  . Abdominal hysterectomy    . Partial mastectomy with needle localization and axillary sentinel lymph node bx Left 08/18/2012    Procedure: PARTIAL MASTECTOMY WITH NEEDLE LOCALIZATION AND AXILLARY SENTINEL LYMPH NODE BX;  Surgeon: Jamesetta So, MD;  Location: [facility];  Service: General;  Laterality: Left;  Need Frozen Section/Sentinel Node Bx @ 8:00am/Needle Loc @ 9:00am    History   Social History  . Marital Status: Widowed    Spouse Name: N/A    Number of Children: 1  . Years of Education: N/A   Occupational History  . retired; Facilities manager asst    Social History Main Topics  . Smoking status: Never Smoker   . Smokeless tobacco: Never Used  . Alcohol Use: No  . Drug Use: No  . Sexual Activity: Not Currently   Other Topics Concern  . Not on file   Social History Narrative   1 adopted son-grown   Lives alone     No family history of premature CAD in 1st degree relatives.  Prior to Admission medications   Medication Sig Start Date End Date Taking? Authorizing Provider  anastrozole (ARIMIDEX) 1 MG tablet Take 1 mg by mouth daily. To start July 1,2014 11/24/12  Yes Pieter Partridge, MD  B Complex-C (SUPER B COMPLEX PO) Take 1 tablet by mouth every morning.    Yes Historical Provider, MD  Calcium Carb-Cholecalciferol (CALCIUM 600/VITAMIN D3) 600-800 MG-UNIT TABS Take 1 capsule by mouth 2 (two) times daily.   Yes Historical Provider, MD  Diphenhydramine-PSE-APAP (ALLERGY/SINUS HEADACHE PO) Take 1 tablet by mouth as needed. For headaches   Yes Historical Provider, MD  Glucosamine-Chondroitin (GLUCOSAMINE CHONDR COMPLEX PO) Take 1 tablet by mouth 2 (two) times daily.    Yes Historical Provider, MD  ibuprofen (ADVIL,MOTRIN) 200 MG  tablet Take by mouth 2 (two) times daily as needed. Takes 3 tablets   Yes Historical Provider, MD  ipratropium (ATROVENT) 0.03 % nasal spray Place 2 sprays into the nose every 12 (twelve) hours.   Yes Historical Provider, MD  loperamide (IMODIUM) 2 MG capsule Take 2 mg by mouth 4 (four) times daily as needed. For diarrhea   Yes Historical Provider, MD  LUTEIN PO Take 45 mg by mouth daily.    Yes Historical Provider, MD  Multiple Vitamins-Minerals (MULTIVITAMINS THER. W/MINERALS) TABS Take 1 tablet by mouth every morning.    Yes Historical Provider, MD  omeprazole (PRILOSEC) 20  MG capsule Take 20 mg by mouth every morning. 04/22/11  Yes Andria Meuse, NP     Review of systems complete and found to be negative unless listed above in HPI     Physical exam Blood pressure 138/88, pulse 63, height 5\' 3"  (1.6 m), weight 189 lb (85.73 kg), SpO2 98.00%. General: NAD Neck: No JVD, no thyromegaly or thyroid nodule.  Lungs: Clear to auscultation bilaterally with normal respiratory effort. CV: Nondisplaced PMI.  Heart regular S1/S2, no S3/S4, no murmur.  No peripheral edema.  No carotid bruit.  Normal pedal pulses.  Abdomen: Soft, nontender, no hepatosplenomegaly, no distention.  Skin: Intact without lesions or rashes.  Neurologic: Alert and oriented x 3.  Psych: Normal affect. Extremities: No clubbing or cyanosis.  HEENT: Normal.   Labs:   Lab Results  Component Value Date   WBC 6.4 05/31/2013   HGB 14.4 05/31/2013   HCT 41.7 05/31/2013   MCV 93.5 05/31/2013   PLT 205 05/31/2013   No results found for this basename: NA, K, CL, CO2, BUN, CREATININE, CALCIUM, LABALBU, PROT, BILITOT, ALKPHOS, ALT, AST, GLUCOSE,  in the last 168 hours No results found for this basename: CKTOTAL, CKMB, CKMBINDEX, TROPONINI    No results found for this basename: CHOL   No results found for this basename: HDL   No results found for this basename: LDLCALC   No results found for this basename: TRIG   No  results found for this basename: CHOLHDL   No results found for this basename: LDLDIRECT         Studies: See HPI  ASSESSMENT AND PLAN: 1. Dyspnea on exertion and fatigue: This has been going on for approximately one year. Her ECG shows a nonspecific ST segment and T wave abnormality. In order to rule out occult ischemic heart disease as a potential etiology, I will proceed with an echocardiogram to evaluate for structural heart disease and an exercise Cardiolite to evaluate for ischemia.  Dispo: f/u 1 month.  Signed: Kate Sable, M.D., F.A.C.C.  10/11/2013, 10:37 AM

## 2013-10-11 NOTE — Telephone Encounter (Signed)
Pt aware of Diagnostic Mammogram. Appointment for 10/26/13 at 2:45 at [facility].

## 2013-10-18 ENCOUNTER — Encounter (HOSPITAL_COMMUNITY): Payer: Self-pay

## 2013-10-18 ENCOUNTER — Encounter (HOSPITAL_COMMUNITY)
Admission: RE | Admit: 2013-10-18 | Discharge: 2013-10-18 | Disposition: A | Discharge status: Home / Self Care | Payer: Medicare Other | Source: Ambulatory Visit | Attending: Cardiovascular Disease | Admitting: Cardiovascular Disease

## 2013-10-18 ENCOUNTER — Ambulatory Visit (HOSPITAL_COMMUNITY)
Admission: RE | Admit: 2013-10-18 | Discharge: 2013-10-18 | Disposition: A | Discharge status: Home / Self Care | Payer: Medicare Other | Source: Ambulatory Visit | Attending: Cardiovascular Disease | Admitting: Cardiovascular Disease

## 2013-10-18 DIAGNOSIS — R9431 Abnormal electrocardiogram [ECG] [EKG]: Secondary | ICD-10-CM

## 2013-10-18 DIAGNOSIS — R0609 Other forms of dyspnea: Secondary | ICD-10-CM | POA: Insufficient documentation

## 2013-10-18 DIAGNOSIS — Z6833 Body mass index (BMI) 33.0-33.9, adult: Secondary | ICD-10-CM | POA: Insufficient documentation

## 2013-10-18 DIAGNOSIS — R5383 Other fatigue: Secondary | ICD-10-CM

## 2013-10-18 DIAGNOSIS — R5381 Other malaise: Secondary | ICD-10-CM | POA: Insufficient documentation

## 2013-10-18 DIAGNOSIS — R0989 Other specified symptoms and signs involving the circulatory and respiratory systems: Secondary | ICD-10-CM

## 2013-10-18 DIAGNOSIS — C50919 Malignant neoplasm of unspecified site of unspecified female breast: Secondary | ICD-10-CM | POA: Diagnosis not present

## 2013-10-18 DIAGNOSIS — R0600 Dyspnea, unspecified: Secondary | ICD-10-CM

## 2013-10-18 DIAGNOSIS — I519 Heart disease, unspecified: Secondary | ICD-10-CM | POA: Diagnosis not present

## 2013-10-18 DIAGNOSIS — K219 Gastro-esophageal reflux disease without esophagitis: Secondary | ICD-10-CM | POA: Diagnosis not present

## 2013-10-18 MED ORDER — REGADENOSON 0.4 MG/5ML IV SOLN
INTRAVENOUS | Status: AC
  Filled 2013-10-18: qty 5

## 2013-10-18 MED ORDER — TECHNETIUM TC 99M SESTAMIBI - CARDIOLITE
10.0000 | Freq: Once | INTRAVENOUS | Status: AC | PRN
  Administered 2013-10-18: 10 via INTRAVENOUS

## 2013-10-18 MED ORDER — SODIUM CHLORIDE 0.9 % IJ SOLN
INTRAMUSCULAR | Status: AC
  Administered 2013-10-18: 10 mL via INTRAVENOUS
  Filled 2013-10-18: qty 10

## 2013-10-18 MED ORDER — TECHNETIUM TC 99M SESTAMIBI GENERIC - CARDIOLITE
30.0000 | Freq: Once | INTRAVENOUS | Status: AC | PRN
  Administered 2013-10-18: 30 via INTRAVENOUS

## 2013-10-18 NOTE — Progress Notes (Signed)
Stress Lab Nurses Notes - Kayla Thomas  Kayla Thomas 10/18/2013 Reason for doing test: SOB & Fatigue Type of test: Stress Cardiolite Nurse performing test: Gerrit Halls, RN Nuclear Medicine Tech: Melburn Hake Echo Tech: Not Applicable MD performing test: Koneswaran/K.Lawrence NP Family MD: Willey Blade Test explained and consent signed: yes IV started: 22g jelco, Saline lock flushed, No redness or edema and Saline lock started in radiology Symptoms: Fatigue & SOB Treatment/Intervention: None Reason test stopped: fatigue After recovery IV was: Discontinued via X-ray tech and No redness or edema Patient to return to [facility]. Med at :10:30 Patient discharged: Home Patient's Condition upon discharge was: stable Comments: During test peak BP 111/66 & HR 129 .  Recovery BP 148/72 & HR 72.  Symptoms resolved in recovery. Donnajean Lopes

## 2013-10-18 NOTE — Progress Notes (Signed)
  Echocardiogram   Echocardiogram 2D Echocardiogram has been performed.  Kayla Thomas 10/18/2013, 11:56 AM

## 2013-10-19 ENCOUNTER — Telehealth: Payer: Self-pay | Admitting: *Deleted

## 2013-10-19 NOTE — Telephone Encounter (Signed)
Message copied by Merlene Laughter on Wed Oct 19, 2013  2:49 PM ------      Message from: Kate Sable A      Created: Tue Oct 18, 2013  3:07 PM       No changes to Rx plan. Can inform pt of good pumping function. ------

## 2013-10-19 NOTE — Telephone Encounter (Signed)
Patient informed. 

## 2013-10-20 ENCOUNTER — Telehealth: Payer: Self-pay | Admitting: *Deleted

## 2013-10-20 NOTE — Telephone Encounter (Signed)
Notes Recorded by Laurine Blazer, LPN on 0/72/2575 at 0:51 PM Patient notified and verbalized understanding. Has follow up scheduled for 11/11/2013 with Dr. Bronson Ing in [place] office.

## 2013-10-20 NOTE — Telephone Encounter (Signed)
Message copied by Laurine Blazer on Thu Oct 20, 2013  4:08 PM ------      Message from: Kate Sable A      Created: Tue Oct 18, 2013  4:17 PM       Can inform pt that while images looked good, exercise tolerance was markedly diminished. I will discuss further management at f/u ov. ------

## 2013-10-26 ENCOUNTER — Ambulatory Visit (HOSPITAL_COMMUNITY)
Admission: RE | Admit: 2013-10-26 | Discharge: 2013-10-26 | Disposition: A | Discharge status: Home / Self Care | Payer: Medicare Other | Source: Ambulatory Visit | Attending: Obstetrics and Gynecology | Admitting: Obstetrics and Gynecology

## 2013-10-26 DIAGNOSIS — Z9010 Acquired absence of unspecified breast and nipple: Secondary | ICD-10-CM | POA: Diagnosis not present

## 2013-10-26 DIAGNOSIS — Z853 Personal history of malignant neoplasm of breast: Secondary | ICD-10-CM | POA: Diagnosis not present

## 2013-10-26 DIAGNOSIS — Z923 Personal history of irradiation: Secondary | ICD-10-CM | POA: Diagnosis not present

## 2013-10-26 DIAGNOSIS — R922 Inconclusive mammogram: Secondary | ICD-10-CM | POA: Diagnosis not present

## 2013-10-26 DIAGNOSIS — C50919 Malignant neoplasm of unspecified site of unspecified female breast: Secondary | ICD-10-CM

## 2013-10-27 ENCOUNTER — Encounter: Payer: Self-pay | Admitting: Obstetrics and Gynecology

## 2013-11-08 ENCOUNTER — Other Ambulatory Visit (HOSPITAL_COMMUNITY): Payer: Self-pay | Admitting: Oncology

## 2013-11-11 ENCOUNTER — Ambulatory Visit (INDEPENDENT_AMBULATORY_CARE_PROVIDER_SITE_OTHER): Payer: Medicare Other | Admitting: Cardiovascular Disease

## 2013-11-11 ENCOUNTER — Encounter: Payer: Self-pay | Admitting: Cardiovascular Disease

## 2013-11-11 VITALS — BP 118/52 | HR 62 | Ht 63.0 in | Wt 189.0 lb

## 2013-11-11 DIAGNOSIS — Z136 Encounter for screening for cardiovascular disorders: Secondary | ICD-10-CM

## 2013-11-11 DIAGNOSIS — R5383 Other fatigue: Secondary | ICD-10-CM | POA: Diagnosis not present

## 2013-11-11 DIAGNOSIS — R9431 Abnormal electrocardiogram [ECG] [EKG]: Secondary | ICD-10-CM | POA: Diagnosis not present

## 2013-11-11 DIAGNOSIS — R5381 Other malaise: Secondary | ICD-10-CM | POA: Diagnosis not present

## 2013-11-11 DIAGNOSIS — R0989 Other specified symptoms and signs involving the circulatory and respiratory systems: Secondary | ICD-10-CM

## 2013-11-11 DIAGNOSIS — R0609 Other forms of dyspnea: Secondary | ICD-10-CM | POA: Diagnosis not present

## 2013-11-11 DIAGNOSIS — R942 Abnormal results of pulmonary function studies: Secondary | ICD-10-CM

## 2013-11-11 DIAGNOSIS — R0600 Dyspnea, unspecified: Secondary | ICD-10-CM

## 2013-11-11 NOTE — Progress Notes (Signed)
Patient ID: Kayla Thomas, female   DOB: 04-Aug-1940, 73 y.o.   MRN: [id number]      SUBJECTIVE: Patient is here to follow up on cardiovascular testing performed for the evaluation of dyspnea, fatigue, and an abnormal ECG. Exercise Cardiolite stress test demonstrated normal myocardial perfusion but markedly diminished exercise tolerance, exercising only 3 min 58 seconds and achieving a work level of 4.6 Mets. Echocardiography demonstrated normal LV systolic function (EF 69-62%) with grade I diastolic dysfunction.  She is able to carry out her ADL's without difficulty, including gardening outdoors. However, she used to be able to walk up to 1.5 miles in the neighborhood without difficulty, but has been unable to do this over the last several months.  She saw Dr. Luan Pulling in 2013 and underwent PFT's in 09/2011 which demonstrated minimal airway obstruction with evidence for interstitial inflammation vs fibrosis. She denies chest pain, orthopnea, and PND.  She has now incorporated lifting light weights prior to her three times weekly pool workouts.   Allergies  Allergen Reactions  . Aspirin Other (See Comments)    REACTION: Abdominal pain  . Iron     REACTION: abdominal pain  . Sulfa Antibiotics Rash    Rash all over  . Ancef [Cefazolin] Rash    Current Outpatient Prescriptions  Medication Sig Dispense Refill  . anastrozole (ARIMIDEX) 1 MG tablet TAKE ONE TABLET DAILY.  90 tablet  3  . B Complex-C (SUPER B COMPLEX PO) Take 1 tablet by mouth every morning.       . Calcium Carb-Cholecalciferol (CALCIUM 600/VITAMIN D3) 600-800 MG-UNIT TABS Take 1 capsule by mouth 2 (two) times daily.      . Diphenhydramine-PSE-APAP (ALLERGY/SINUS HEADACHE PO) Take 1 tablet by mouth as needed. For headaches      . Glucosamine-Chondroitin (GLUCOSAMINE CHONDR COMPLEX PO) Take 1 tablet by mouth 2 (two) times daily.       Marland Kitchen ibuprofen (ADVIL,MOTRIN) 200 MG tablet Take by mouth 2 (two) times daily as needed.  Takes 3 tablets      . ipratropium (ATROVENT) 0.03 % nasal spray Place 2 sprays into the nose every 12 (twelve) hours.      Marland Kitchen loperamide (IMODIUM) 2 MG capsule Take 2 mg by mouth 4 (four) times daily as needed. For diarrhea      . LUTEIN PO Take 45 mg by mouth daily.       . Multiple Vitamins-Minerals (MULTIVITAMINS THER. W/MINERALS) TABS Take 1 tablet by mouth every morning.       Marland Kitchen omeprazole (PRILOSEC) 20 MG capsule Take 20 mg by mouth every morning.       No current facility-administered medications for this visit.    Past Medical History  Diagnosis Date  . Uterine cancer 1998  . Chronic back pain   . GERD (gastroesophageal reflux disease) 01/21/2006    EGD Dr Oneida Alar mild chronic gastritis, (NO h pylori) otherwise normal  . Sinus congestion   . Headache(784.0)   . Breast cancer   . Thumb tendonitis September 2014    left  . Breast cancer     Past Surgical History  Procedure Laterality Date  . Cholecystectomy  11/1999  . S/p hysterectomy  1998    uterine ca  . Temperal/mandibular  1974  . Esophagogastroduodenoscopy  01/21/06    mild antral erythema bx h-pylori/normal esophagus without evidence of mass or Barrett's/normal pylorus and duodenum  . Cataracts    . Colonoscopy  05/13/2011    Procedure: COLONOSCOPY;  Surgeon: Carlyon Prows  Salem Caster, MD;  Location: [facility];  Service: Endoscopy;  Laterality: N/A;  8:30  . Tonsillectomy  1964  . Abscess drainage  03-04-05    insect bite  . Cataract extraction w/phaco  10/06/2011    Procedure: CATARACT EXTRACTION PHACO AND INTRAOCULAR LENS PLACEMENT (IOC);  Surgeon: Williams Che, MD;  Location: [facility];  Service: Ophthalmology;  Laterality: Left;  CDE:  5.76  . Abdominal hysterectomy    . Partial mastectomy with needle localization and axillary sentinel lymph node bx Left 08/18/2012    Procedure: PARTIAL MASTECTOMY WITH NEEDLE LOCALIZATION AND AXILLARY SENTINEL LYMPH NODE BX;  Surgeon: Jamesetta So, MD;  Location: [facility];  Service:  General;  Laterality: Left;  Need Frozen Section/Sentinel Node Bx @ 8:00am/Needle Loc @ 9:00am    History   Social History  . Marital Status: Widowed    Spouse Name: N/A    Number of Children: 1  . Years of Education: N/A   Occupational History  . retired; Facilities manager asst    Social History Main Topics  . Smoking status: Never Smoker   . Smokeless tobacco: Never Used  . Alcohol Use: No  . Drug Use: No  . Sexual Activity: Not Currently   Other Topics Concern  . Not on file   Social History Narrative   1 adopted son-grown   Lives alone    BP 118/52  Pulse 62    PHYSICAL EXAM General: NAD Neck: No JVD, no thyromegaly. Lungs: Clear to auscultation bilaterally with normal respiratory effort. CV: Nondisplaced PMI.  Regular rate and rhythm, normal S1/S2, no S3/S4, no murmur. No pretibial or periankle edema.  No carotid bruit.  Normal pedal pulses.  Abdomen: Soft, nontender, no hepatosplenomegaly, no distention.  Neurologic: Alert and oriented x 3.  Psych: Normal affect. Extremities: No clubbing or cyanosis.   ECG: reviewed and available in electronic records.      ASSESSMENT AND PLAN: 1. Dyspnea on exertion and fatigue: This has been going on for approximately one year. Her ECG shows a nonspecific ST segment and T wave abnormality. Given her limited exercise tolerance, I am still concerned about occult ischemic heart disease as a potential etiology. However, PFT's in 09/2011 did demonstrate evidence for a possible interstitial process such as inflammation vs fibrosis. Given that her primary symptoms are dyspnea on exertion, I may consider repeating PFT's. If these are unrevealing, I may consider coronary angiography with both right and left heart catheterization.  Dispo: f/u in early August.   Kate Sable, M.D., F.A.C.C.

## 2013-11-11 NOTE — Patient Instructions (Signed)
Your physician recommends that you schedule a follow-up appointment AU:[facility] August with [facility]     Your physician recommends that you continue on your current medications as directed. Please refer to the Current Medication list given to you today.       Thank you for choosing [facility] !

## 2013-11-28 ENCOUNTER — Encounter (HOSPITAL_COMMUNITY): Payer: Medicare Other | Attending: Hematology and Oncology

## 2013-11-28 DIAGNOSIS — R55 Syncope and collapse: Secondary | ICD-10-CM | POA: Diagnosis not present

## 2013-11-28 DIAGNOSIS — Z79899 Other long term (current) drug therapy: Secondary | ICD-10-CM | POA: Insufficient documentation

## 2013-11-28 DIAGNOSIS — Z171 Estrogen receptor negative status [ER-]: Secondary | ICD-10-CM | POA: Diagnosis not present

## 2013-11-28 DIAGNOSIS — C50419 Malignant neoplasm of upper-outer quadrant of unspecified female breast: Secondary | ICD-10-CM

## 2013-11-28 DIAGNOSIS — G43909 Migraine, unspecified, not intractable, without status migrainosus: Secondary | ICD-10-CM | POA: Diagnosis not present

## 2013-11-28 DIAGNOSIS — K219 Gastro-esophageal reflux disease without esophagitis: Secondary | ICD-10-CM | POA: Diagnosis not present

## 2013-11-28 DIAGNOSIS — Z923 Personal history of irradiation: Secondary | ICD-10-CM | POA: Insufficient documentation

## 2013-11-28 DIAGNOSIS — Z9889 Other specified postprocedural states: Secondary | ICD-10-CM | POA: Insufficient documentation

## 2013-11-28 DIAGNOSIS — C50919 Malignant neoplasm of unspecified site of unspecified female breast: Secondary | ICD-10-CM | POA: Insufficient documentation

## 2013-11-28 DIAGNOSIS — Z8542 Personal history of malignant neoplasm of other parts of uterus: Secondary | ICD-10-CM | POA: Insufficient documentation

## 2013-11-28 DIAGNOSIS — C50412 Malignant neoplasm of upper-outer quadrant of left female breast: Secondary | ICD-10-CM

## 2013-11-28 LAB — COMPREHENSIVE METABOLIC PANEL
ALT: 18 U/L (ref 0–35)
AST: 19 U/L (ref 0–37)
Albumin: 4 g/dL (ref 3.5–5.2)
Alkaline Phosphatase: 125 U/L — ABNORMAL HIGH (ref 39–117)
BUN: 16 mg/dL (ref 6–23)
CALCIUM: 9.5 mg/dL (ref 8.4–10.5)
CO2: 24 mEq/L (ref 19–32)
Chloride: 101 mEq/L (ref 96–112)
Creatinine, Ser: 0.76 mg/dL (ref 0.50–1.10)
GFR calc non Af Amer: 82 mL/min — ABNORMAL LOW (ref >90)
Glucose, Bld: 112 mg/dL — ABNORMAL HIGH (ref 70–99)
Potassium: 4.5 mEq/L (ref 3.7–5.3)
Sodium: 139 mEq/L (ref 137–147)
TOTAL PROTEIN: 6.7 g/dL (ref 6.0–8.3)
Total Bilirubin: 0.5 mg/dL (ref 0.3–1.2)

## 2013-11-28 LAB — CBC WITH DIFFERENTIAL/PLATELET
Basophils Absolute: 0 10*3/uL (ref 0.0–0.1)
Basophils Relative: 0 % (ref 0–1)
EOS ABS: 0.1 10*3/uL (ref 0.0–0.7)
EOS PCT: 3 % (ref 0–5)
HEMATOCRIT: 39 % (ref 36.0–46.0)
HEMOGLOBIN: 13.5 g/dL (ref 12.0–15.0)
LYMPHS PCT: 22 % (ref 12–46)
Lymphs Abs: 1.2 10*3/uL (ref 0.7–4.0)
MCH: 31.7 pg (ref 26.0–34.0)
MCHC: 34.6 g/dL (ref 30.0–36.0)
MCV: 91.5 fL (ref 78.0–100.0)
MONO ABS: 0.4 10*3/uL (ref 0.1–1.0)
MONOS PCT: 7 % (ref 3–12)
Neutro Abs: 3.8 10*3/uL (ref 1.7–7.7)
Neutrophils Relative %: 68 % (ref 43–77)
PLATELETS: 215 10*3/uL (ref 150–400)
RBC: 4.26 MIL/uL (ref 3.87–5.11)
RDW: 12.5 % (ref 11.5–15.5)
WBC: 5.5 10*3/uL (ref 4.0–10.5)

## 2013-11-28 NOTE — Progress Notes (Signed)
LABS DAWN FOR CBCD, CMP, CEA, CA27.29.

## 2013-11-29 ENCOUNTER — Encounter (HOSPITAL_COMMUNITY): Payer: Self-pay

## 2013-11-29 ENCOUNTER — Encounter (HOSPITAL_BASED_OUTPATIENT_CLINIC_OR_DEPARTMENT_OTHER): Payer: Medicare Other

## 2013-11-29 VITALS — HR 64 | Temp 98.0°F | Resp 20 | Wt 187.4 lb

## 2013-11-29 DIAGNOSIS — C50419 Malignant neoplasm of upper-outer quadrant of unspecified female breast: Secondary | ICD-10-CM | POA: Diagnosis not present

## 2013-11-29 DIAGNOSIS — C50919 Malignant neoplasm of unspecified site of unspecified female breast: Secondary | ICD-10-CM

## 2013-11-29 DIAGNOSIS — Z8542 Personal history of malignant neoplasm of other parts of uterus: Secondary | ICD-10-CM | POA: Diagnosis not present

## 2013-11-29 LAB — CEA

## 2013-11-29 LAB — CANCER ANTIGEN 27.29: CA 27.29: 19 U/mL (ref 0–39)

## 2013-11-29 NOTE — Progress Notes (Signed)
[facility]  OFFICE PROGRESS [name], MD [address] [place] [address]  DIAGNOSIS: Malignant neoplasm of breast (female), unspecified site - Plan: CBC with Differential, Comprehensive metabolic panel, Cancer antigen 27.29, CEA  Personal history of malignant neoplasm of other parts of uterus - Plan: CA 125  Chief Complaint  Patient presents with  . Breast Cancer  . Follow-up    CURRENT THERAPY: Anastrozole 1 mg daily  INTERVAL HISTORY: Kayla Thomas 73 y.o. female returns for followup of stage I left breast cancer, status post lumpectomy, radiotherapy, and anastrozole, the latter begun on 12/21/2012.  She developed severe migraines while trying to take Effexor for hot flashes and discontinued the drug. Off flashes have diminished over the months. She denies any lymphedema, abnormalities on self breast examination, nasal drip, sore throat, cough, wheezing, abdominal pain, nausea, vomiting, PND, orthopnea, palpitations, headache, lower extremity swelling or redness, chest pain, worsening degenerative joint disease, skin rash, headache, or seizures.  MEDICAL HISTORY: Past Medical History  Diagnosis Date  . Uterine cancer 1998  . Chronic back pain   . GERD (gastroesophageal reflux disease) 01/21/2006    EGD Dr Oneida Alar mild chronic gastritis, (NO h pylori) otherwise normal  . Sinus congestion   . Headache(784.0)   . Breast cancer   . Thumb tendonitis September 2014    left  . Breast cancer     INTERIM HISTORY: has MIGRAINE HEADACHE; GERD; GASTRITIS; LOOSE STOOLS; ALLERGY; UTERINE CANCER, HX OF; Screen for colon cancer; and Annual physical exam on her problem list.   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 Tumor was Estrogen Receptor 100%,Progesterone Receptor NEGATIVE, Ki67 by M IB-1 (Low<20%): 7% Her-2-neu not amplified by FISH.  She had lumpectomy with axillary lymph node biopsy on  08/18/2012,  pathology showed;  1. Lymph node, sentinel, biopsy, left axilla  - FIVE BENIGN LYMPH NODES (0/5).  2. Breast, partial mastectomy, left breast mass  - INVASIVE DUCTAL CARCINOMA, 1.2 CM.  - MARGINS NOT INVOLVED.  - NO ANGIOLYMPHATIC INVASION IDENTIFIED.  TNM: pT1c, pN0, pMX.  She went on to complete radiation from 10/12/2012 to 11/26/2012. She did develop some moderate erythema and desquamation of the skin otherwise tolerated the radiation well.  She began Arimidex on  12/21/2012.  Baseline DEXA 11/2012 scan shows normal bone mineral density  ALLERGIES:  is allergic to aspirin; iron; sulfa antibiotics; and ancef.  MEDICATIONS: has a current medication list which includes the following prescription(s): anastrozole, b complex-c, calcium carb-cholecalciferol, diphenhydramine-pse-apap, glucosamine-chondroitin, ibuprofen, ipratropium, loperamide, lutein, multivitamins ther. w/minerals, and omeprazole.  SURGICAL HISTORY:  Past Surgical History  Procedure Laterality Date  . Cholecystectomy  11/1999  . S/p hysterectomy  1998    uterine ca  . Temperal/mandibular  1974  . Esophagogastroduodenoscopy  01/21/06    mild antral erythema bx h-pylori/normal esophagus without evidence of mass or Barrett's/normal pylorus and duodenum  . Cataracts    . Colonoscopy  05/13/2011    Procedure: COLONOSCOPY;  Surgeon: Dorothyann Peng, MD;  [facility];  Service: Endoscopy;  Laterality: N/A;  8:30  . Tonsillectomy  1964  . Abscess drainage  03-04-05    insect bite  . Cataract extraction w/phaco  10/06/2011    Procedure: CATARACT EXTRACTION PHACO AND INTRAOCULAR LENS PLACEMENT (IOC);  Surgeon: Williams Che, MD;  Location: [facility];  Service: Ophthalmology;  Laterality: Left;  CDE:  5.76  . Abdominal  hysterectomy    . Partial mastectomy with needle localization and axillary sentinel lymph node bx Left 08/18/2012    Procedure: PARTIAL MASTECTOMY WITH NEEDLE LOCALIZATION AND AXILLARY SENTINEL  LYMPH NODE BX;  Surgeon: Jamesetta So, MD;  Location: [facility];  Service: General;  Laterality: Left;  Need Frozen Section/Sentinel Node Bx @ 8:00am/Needle Loc @ 9:00am    FAMILY HISTORY: family history includes Alcohol abuse in her father; COPD in her mother; Hypertension in her mother; Liver cancer in her father.  SOCIAL HISTORY:  reports that she has never smoked. She has never used smokeless tobacco. She reports that she does not drink alcohol or use illicit drugs.  REVIEW OF SYSTEMS:  Other than that discussed above is noncontributory.  PHYSICAL EXAMINATION: ECOG PERFORMANCE STATUS: 1 - Symptomatic but completely ambulatory  Pulse 64, temperature 98 F (36.7 C), temperature source Oral, resp. rate 20, weight 187 lb 6.4 oz (85.004 kg).  GENERAL:alert, no distress and comfortable SKIN: skin color, texture, turgor are normal, no rashes or significant lesions EYES: PERLA; Conjunctiva are pink and non-injected, sclera clear SINUSES: No redness or tenderness over maxillary or ethmoid sinuses OROPHARYNX:no exudate, no erythema on lips, buccal mucosa, or tongue. NECK: supple, thyroid normal size, non-tender, without nodularity. No masses CHEST: status post left breast lumpectomy with induration from radiation but no discrete masses. Right breast without mass. LYMPH:  no palpable lymphadenopathy in the cervical, axillary or inguinal LUNGS: clear to auscultation and percussion with normal breathing effort HEART: regular rate & rhythm and no murmurs. ABDOMEN:abdomen soft, non-tender and normal bowel sounds MUSCULOSKELETAL:no cyanosis of digits and no clubbing. Range of motion normal.  NEURO: alert & oriented x 3 with fluent speech, no focal motor/sensory deficits   LABORATORY DATA: Infusion on 11/28/2013  Component Date Value Ref Range Status  . WBC 11/28/2013 5.5  4.0 - 10.5 K/uL Final  . RBC 11/28/2013 4.26  3.87 - 5.11 MIL/uL Final  . Hemoglobin 11/28/2013 13.5  12.0 - 15.0 g/dL Final   . HCT 11/28/2013 39.0  36.0 - 46.0 % Final  . MCV 11/28/2013 91.5  78.0 - 100.0 fL Final  . MCH 11/28/2013 31.7  26.0 - 34.0 pg Final  . MCHC 11/28/2013 34.6  30.0 - 36.0 g/dL Final  . RDW 11/28/2013 12.5  11.5 - 15.5 % Final  . Platelets 11/28/2013 215  150 - 400 K/uL Final  . Neutrophils Relative % 11/28/2013 68  43 - 77 % Final  . Neutro Abs 11/28/2013 3.8  1.7 - 7.7 K/uL Final  . Lymphocytes Relative 11/28/2013 22  12 - 46 % Final  . Lymphs Abs 11/28/2013 1.2  0.7 - 4.0 K/uL Final  . Monocytes Relative 11/28/2013 7  3 - 12 % Final  . Monocytes Absolute 11/28/2013 0.4  0.1 - 1.0 K/uL Final  . Eosinophils Relative 11/28/2013 3  0 - 5 % Final  . Eosinophils Absolute 11/28/2013 0.1  0.0 - 0.7 K/uL Final  . Basophils Relative 11/28/2013 0  0 - 1 % Final  . Basophils Absolute 11/28/2013 0.0  0.0 - 0.1 K/uL Final  . Sodium 11/28/2013 139  137 - 147 mEq/L Final  . Potassium 11/28/2013 4.5  3.7 - 5.3 mEq/L Final  . Chloride 11/28/2013 101  96 - 112 mEq/L Final  . CO2 11/28/2013 24  19 - 32 mEq/L Final  . Glucose, Bld 11/28/2013 112* 70 - 99 mg/dL Final  . BUN 11/28/2013 16  6 - 23 mg/dL Final  . Creatinine,  Ser 11/28/2013 0.76  0.50 - 1.10 mg/dL Final  . Calcium 11/28/2013 9.5  8.4 - 10.5 mg/dL Final  . Total Protein 11/28/2013 6.7  6.0 - 8.3 g/dL Final  . Albumin 11/28/2013 4.0  3.5 - 5.2 g/dL Final  . AST 11/28/2013 19  0 - 37 U/L Final  . ALT 11/28/2013 18  0 - 35 U/L Final  . Alkaline Phosphatase 11/28/2013 125* 39 - 117 U/L Final  . Total Bilirubin 11/28/2013 0.5  0.3 - 1.2 mg/dL Final  . GFR calc non Af Amer 11/28/2013 82* >90 mL/min Final  . GFR calc Af Amer 11/28/2013 >90  >90 mL/min Final   Comment: (NOTE)                          The eGFR has been calculated using the CKD EPI equation.                          This calculation has not been validated in all clinical situations.                          eGFR's persistently <90 mL/min signify possible Chronic Kidney                           Disease.  . CEA 11/28/2013 <0.5  0.0 - 5.0 ng/mL Final   Performed at [facility]  . CA 27.29 11/28/2013 19  0 - 39 U/mL Final   Performed at [facility]    PATHOLOGY:no new pathology.  Urinalysis No results found for this basename: colorurine,  appearanceur,  labspec,  phurine,  glucoseu,  hgbur,  bilirubinur,  ketonesur,  proteinur,  urobilinogen,  nitrite,  leukocytesur    RADIOGRAPHIC STUDIES: MM Digital Diagnostic Bilat Status: Final result            Study Result    CLINICAL DATA: Left breast cancer, diagnosed in 2014, status post  lumpectomy radiation. Annual exam.  EXAM:  DIGITAL DIAGNOSTIC BILATERAL MAMMOGRAM WITH CAD  COMPARISON: With priors  ACR Breast Density Category b: There are scattered areas of  fibroglandular density.  FINDINGS:  There are lumpectomy changes in the upper outer left breast,  posterior third. Skin thickening of the left breast is compatible  with radiation change. No mass, nonsurgical distortion, or  suspicious microcalcification is identified in either breast to  suggest malignancy.  Mammographic images were processed with CAD.  IMPRESSION:  No evidence of malignancy in either breast. Lumpectomy changes on  the left.  RECOMMENDATION:  Diagnostic mammogram is suggested in 1 year. (Code:DM-B-01Y)  I have discussed the findings and recommendations with the patient.  Results were also provided in writing at the conclusion of the  visit. If applicable, a reminder letter will be sent to the patient  regarding the next appointment.  BI-RADS CATEGORY 2: Benign.  Electronically Signed  By: Curlene Dolphin M.D.  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 Tumor was Estrogen Receptor 100%,Progesterone Receptor NEGATIVE, Ki67 by M IB-1 (Low<20%): 7% Her-2-neu not amplified by FISH.  She had lumpectomy with axillary lymph node biopsy on 08/18/2012, status  post radiotherapy currently taking anastrozole 1 mg daily with generalized achiness with improvement in hot flashes.  #2. Vasomotor instability, improved with adverse effect to Effexor producing  worsening migraines. #3. Gastroesophageal reflux disease.  #4. History of cancer of  the uterus, no evidence of disease.  #5. History of migraine syndrome.      PLAN:  #1. Continue anastrozole 1 mg daily along with ibuprofen as needed for joint discomfort. #2. Followup in 6 months with CBC, chem profile, CEA, and CA 27-29.   All questions were answered. The patient knows to call the clinic with any problems, questions or concerns. We can certainly see the patient much sooner if necessary.   I spent 25 minutes counseling the patient face to face. The total time spent in the appointment was 30 minutes.    Farrel Gobble, MD 11/29/2013 10:20 AM  DISCLAIMER:  This note was dictated with voice recognition software.  Similar sounding words can inadvertently be transcribed inaccurately and may not be corrected upon review.

## 2013-11-29 NOTE — Patient Instructions (Signed)
[facility] Discharge Instructions  RECOMMENDATIONS MADE BY THE CONSULTANT AND ANY TEST RESULTS WILL BE SENT TO YOUR REFERRING PHYSICIAN.  Return in 6 months for blood work and office visit.  Thank you for choosing [facility] to provide your oncology and hematology care.  To afford each patient quality time with our providers, please arrive at least 15 minutes before your scheduled appointment time.  With your help, our goal is to use those 15 minutes to complete the necessary work-up to ensure our physicians have the information they need to help with your evaluation and healthcare recommendations.    Effective January 1st, 2014, we ask that you re-schedule your appointment with our physicians should you arrive 10 or more minutes late for your appointment.  We strive to give you quality time with our providers, and arriving late affects you and other patients whose appointments are after yours.    Again, thank you for choosing [facility].  Our hope is that these requests will decrease the amount of time that you wait before being seen by our physicians.       _____________________________________________________________  Should you have questions after your visit to [facility], please contact our office at [phone] between the hours of 8:30 a.m. and 5:00 p.m.  Voicemails left after 4:30 p.m. will not be returned until the following business day.  For prescription refill requests, have your pharmacy contact our office with your prescription refill request.

## 2014-02-10 DIAGNOSIS — M654 Radial styloid tenosynovitis [de Quervain]: Secondary | ICD-10-CM | POA: Diagnosis not present

## 2014-02-10 DIAGNOSIS — M19049 Primary osteoarthritis, unspecified hand: Secondary | ICD-10-CM | POA: Diagnosis not present

## 2014-02-10 DIAGNOSIS — M6530 Trigger finger, unspecified finger: Secondary | ICD-10-CM | POA: Diagnosis not present

## 2014-03-10 DIAGNOSIS — M6530 Trigger finger, unspecified finger: Secondary | ICD-10-CM | POA: Diagnosis not present

## 2014-04-12 ENCOUNTER — Encounter: Payer: Self-pay | Admitting: Cardiovascular Disease

## 2014-04-12 ENCOUNTER — Ambulatory Visit (INDEPENDENT_AMBULATORY_CARE_PROVIDER_SITE_OTHER): Payer: Medicare Other | Admitting: Cardiovascular Disease

## 2014-04-12 VITALS — BP 118/68 | HR 70 | Ht 63.0 in | Wt 190.0 lb

## 2014-04-12 DIAGNOSIS — Z136 Encounter for screening for cardiovascular disorders: Secondary | ICD-10-CM | POA: Diagnosis not present

## 2014-04-12 DIAGNOSIS — R9431 Abnormal electrocardiogram [ECG] [EKG]: Secondary | ICD-10-CM

## 2014-04-12 DIAGNOSIS — R0600 Dyspnea, unspecified: Secondary | ICD-10-CM

## 2014-04-12 DIAGNOSIS — R5383 Other fatigue: Secondary | ICD-10-CM

## 2014-04-12 DIAGNOSIS — R0609 Other forms of dyspnea: Secondary | ICD-10-CM

## 2014-04-12 NOTE — Progress Notes (Signed)
Patient ID: Kayla Thomas, female   DOB: [birth year], 73 y.o.   MRN: [id number]      SUBJECTIVE: Patient is here to follow up for dyspnea, fatigue, and an abnormal ECG. Exercise Cardiolite stress test on 10/18/13 demonstrated normal myocardial perfusion but markedly diminished exercise tolerance, exercising only 3 min 58 seconds and achieving a work level of 4.6 Mets.  Echocardiography on 10/18/13 demonstrated normal LV systolic function (EF 27-25%) with grade I diastolic dysfunction.   She continues to be able to carry out her ADL's without difficulty, including gardening outdoors.  She can walk up to a half hour before becoming fatigued. She denies chest pain, orthopnea, leg swelling, and PND.  She saw Dr. Luan Pulling in 2013 and underwent PFT's in 09/2011 which demonstrated minimal airway obstruction with evidence for interstitial inflammation vs fibrosis.  She continues to lift light weights prior to her three times weekly pool workouts which are each an hour long, and performs all pool exercises without difficulty.  She believes her symptoms are stable.   Review of Systems: As per "subjective", otherwise negative.  Allergies  Allergen Reactions  . Aspirin Other (See Comments)    REACTION: Abdominal pain  . Iron     REACTION: abdominal pain  . Sulfa Antibiotics Rash    Rash all over  . Ancef [Cefazolin] Rash    Current Outpatient Prescriptions  Medication Sig Dispense Refill  . anastrozole (ARIMIDEX) 1 MG tablet TAKE ONE TABLET DAILY.  90 tablet  3  . B Complex-C (SUPER B COMPLEX PO) Take 1 tablet by mouth every morning.       . Calcium Carb-Cholecalciferol (CALCIUM 600/VITAMIN D3) 600-800 MG-UNIT TABS Take 1 capsule by mouth 2 (two) times daily.      . Diphenhydramine-PSE-APAP (ALLERGY/SINUS HEADACHE PO) Take 1 tablet by mouth as needed. For headaches      . Glucosamine-Chondroitin (GLUCOSAMINE CHONDR COMPLEX PO) Take 1 tablet by mouth 2 (two) times daily.       Marland Kitchen ibuprofen  (ADVIL,MOTRIN) 200 MG tablet Take by mouth 2 (two) times daily as needed. Takes 3 tablets      . ipratropium (ATROVENT) 0.03 % nasal spray Place 2 sprays into the nose every 12 (twelve) hours.      Marland Kitchen loperamide (IMODIUM) 2 MG capsule Take 2 mg by mouth 4 (four) times daily as needed. For diarrhea      . LUTEIN PO Take 45 mg by mouth daily.       . Multiple Vitamins-Minerals (MULTIVITAMINS THER. W/MINERALS) TABS Take 1 tablet by mouth every morning.       Marland Kitchen omeprazole (PRILOSEC) 20 MG capsule Take 20 mg by mouth every morning.      . TURMERIC PO Take by mouth.       No current facility-administered medications for this visit.    Past Medical History  Diagnosis Date  . Uterine cancer 1998  . Chronic back pain   . GERD (gastroesophageal reflux disease) 01/21/2006    EGD Dr Oneida Alar mild chronic gastritis, (NO h pylori) otherwise normal  . Sinus congestion   . Headache(784.0)   . Breast cancer   . Thumb tendonitis September 2014    left  . Breast cancer     Past Surgical History  Procedure Laterality Date  . Cholecystectomy  11/1999  . S/p hysterectomy  1998    uterine ca  . Temperal/mandibular  1974  . Esophagogastroduodenoscopy  01/21/06    mild antral erythema bx h-pylori/normal esophagus without evidence  of mass or Barrett's/normal pylorus and duodenum  . Cataracts    . Colonoscopy  05/13/2011    Procedure: COLONOSCOPY;  Surgeon: Dorothyann Peng, MD;  [facility];  Service: Endoscopy;  Laterality: N/A;  8:30  . Tonsillectomy  1964  . Abscess drainage  03-04-05    insect bite  . Cataract extraction w/phaco  10/06/2011    Procedure: CATARACT EXTRACTION PHACO AND INTRAOCULAR LENS PLACEMENT (IOC);  Surgeon: Williams Che, MD;  Location: [facility];  Service: Ophthalmology;  Laterality: Left;  CDE:  5.76  . Abdominal hysterectomy    . Partial mastectomy with needle localization and axillary sentinel lymph node bx Left 08/18/2012    Procedure: PARTIAL MASTECTOMY WITH NEEDLE  LOCALIZATION AND AXILLARY SENTINEL LYMPH NODE BX;  Surgeon: Jamesetta So, MD;  Location: [facility];  Service: General;  Laterality: Left;  Need Frozen Section/Sentinel Node Bx @ 8:00am/Needle Loc @ 9:00am    History   Social History  . Marital Status: Widowed    Spouse Name: N/A    Number of Children: 1  . Years of Education: N/A   Occupational History  . retired; Facilities manager asst    Social History Main Topics  . Smoking status: Never Smoker   . Smokeless tobacco: Never Used  . Alcohol Use: No  . Drug Use: No  . Sexual Activity: Not Currently   Other Topics Concern  . Not on file   Social History Narrative   1 adopted son-grown   Lives alone     Filed Vitals:   04/12/14 1112  BP: 118/68  Pulse: 70  Height: 5\' 3"  (1.6 m)  Weight: 190 lb (86.183 kg)    PHYSICAL EXAM General: NAD HEENT: Normal. Neck: No JVD, no thyromegaly. Lungs: Clear to auscultation bilaterally with normal respiratory effort. CV: Nondisplaced PMI.  Regular rate and rhythm, normal S1/S2, no S3/S4, no murmur. No pretibial or periankle edema.  No carotid bruit.  Normal pedal pulses.  Abdomen: Soft, nontender, no hepatosplenomegaly, no distention.  Neurologic: Alert and oriented x 3.  Psych: Normal affect. Skin: Normal. Musculoskeletal: Normal range of motion, no gross deformities. Extremities: No clubbing or cyanosis.   ECG: Most recent ECG reviewed.      ASSESSMENT AND PLAN: 1. Dyspnea on exertion and fatigue: This has been going on for over one year. Her ECG previously showed a nonspecific ST segment and T wave abnormality. Given her limited exercise tolerance, I am still concerned about occult ischemic heart disease as a potential etiology. However, PFT's in 09/2011 did demonstrate evidence for a possible interstitial process such as inflammation vs fibrosis. In addition, symptoms have remained stable which is encouraging, and she is able to perform ADL's without limitations.  I have asked her to  inform me if symptoms become worse prior to her next appointment. Given that her primary symptoms are dyspnea on exertion, I may consider repeating PFT's. If these are unrevealing, I may consider coronary angiography with both right and left heart catheterization.   Dispo: f/u in 6 months.   Kate Sable, M.D., F.A.C.C.

## 2014-04-12 NOTE — Patient Instructions (Signed)
Your physician wants you to follow-up in: 6 months with Dr. Koneswaran. You will receive a reminder letter in the mail two months in advance. If you don't receive a letter, please call our office to schedule the follow-up appointment.  Your physician recommends that you continue on your current medications as directed. Please refer to the Current Medication list given to you today.  Thank you for choosing [facility]!   

## 2014-04-28 DIAGNOSIS — M654 Radial styloid tenosynovitis [de Quervain]: Secondary | ICD-10-CM | POA: Diagnosis not present

## 2014-04-28 DIAGNOSIS — M1812 Unilateral primary osteoarthritis of first carpometacarpal joint, left hand: Secondary | ICD-10-CM | POA: Diagnosis not present

## 2014-04-28 DIAGNOSIS — M65341 Trigger finger, right ring finger: Secondary | ICD-10-CM | POA: Diagnosis not present

## 2014-05-02 ENCOUNTER — Other Ambulatory Visit: Payer: Self-pay | Admitting: Orthopedic Surgery

## 2014-05-02 ENCOUNTER — Encounter (HOSPITAL_BASED_OUTPATIENT_CLINIC_OR_DEPARTMENT_OTHER): Payer: Self-pay | Admitting: *Deleted

## 2014-05-02 NOTE — Progress Notes (Signed)
np new labs needed

## 2014-05-04 ENCOUNTER — Ambulatory Visit (HOSPITAL_BASED_OUTPATIENT_CLINIC_OR_DEPARTMENT_OTHER): Payer: Medicare Other | Admitting: Anesthesiology

## 2014-05-04 ENCOUNTER — Encounter (HOSPITAL_BASED_OUTPATIENT_CLINIC_OR_DEPARTMENT_OTHER)
Admission: RE | Disposition: A | Discharge status: Home / Self Care | Payer: Self-pay | Source: Ambulatory Visit | Attending: Orthopedic Surgery

## 2014-05-04 ENCOUNTER — Ambulatory Visit (HOSPITAL_BASED_OUTPATIENT_CLINIC_OR_DEPARTMENT_OTHER)
Admission: RE | Admit: 2014-05-04 | Discharge: 2014-05-04 | Disposition: A | Discharge status: Home / Self Care | Payer: Medicare Other | Source: Ambulatory Visit | Attending: Orthopedic Surgery | Admitting: Orthopedic Surgery

## 2014-05-04 ENCOUNTER — Encounter (HOSPITAL_BASED_OUTPATIENT_CLINIC_OR_DEPARTMENT_OTHER): Payer: Self-pay | Admitting: Anesthesiology

## 2014-05-04 DIAGNOSIS — M65841 Other synovitis and tenosynovitis, right hand: Secondary | ICD-10-CM | POA: Diagnosis not present

## 2014-05-04 DIAGNOSIS — K2970 Gastritis, unspecified, without bleeding: Secondary | ICD-10-CM | POA: Diagnosis not present

## 2014-05-04 DIAGNOSIS — Z886 Allergy status to analgesic agent status: Secondary | ICD-10-CM | POA: Insufficient documentation

## 2014-05-04 DIAGNOSIS — Z882 Allergy status to sulfonamides status: Secondary | ICD-10-CM | POA: Diagnosis not present

## 2014-05-04 DIAGNOSIS — G8929 Other chronic pain: Secondary | ICD-10-CM | POA: Diagnosis not present

## 2014-05-04 DIAGNOSIS — M19041 Primary osteoarthritis, right hand: Secondary | ICD-10-CM | POA: Diagnosis not present

## 2014-05-04 DIAGNOSIS — Z888 Allergy status to other drugs, medicaments and biological substances status: Secondary | ICD-10-CM | POA: Diagnosis not present

## 2014-05-04 DIAGNOSIS — M65341 Trigger finger, right ring finger: Secondary | ICD-10-CM | POA: Diagnosis not present

## 2014-05-04 DIAGNOSIS — K219 Gastro-esophageal reflux disease without esophagitis: Secondary | ICD-10-CM | POA: Insufficient documentation

## 2014-05-04 DIAGNOSIS — Z8542 Personal history of malignant neoplasm of other parts of uterus: Secondary | ICD-10-CM | POA: Insufficient documentation

## 2014-05-04 DIAGNOSIS — G43909 Migraine, unspecified, not intractable, without status migrainosus: Secondary | ICD-10-CM | POA: Diagnosis not present

## 2014-05-04 DIAGNOSIS — Z853 Personal history of malignant neoplasm of breast: Secondary | ICD-10-CM | POA: Diagnosis not present

## 2014-05-04 DIAGNOSIS — M549 Dorsalgia, unspecified: Secondary | ICD-10-CM | POA: Diagnosis not present

## 2014-05-04 HISTORY — PX: TRIGGER FINGER RELEASE: SHX641

## 2014-05-04 HISTORY — DX: Presence of dental prosthetic device (complete) (partial): Z97.2

## 2014-05-04 LAB — POCT HEMOGLOBIN-HEMACUE: Hemoglobin: 15.3 g/dL — ABNORMAL HIGH (ref 12.0–15.0)

## 2014-05-04 SURGERY — RELEASE, A1 PULLEY, FOR TRIGGER FINGER
Anesthesia: Monitor Anesthesia Care | Site: Hand | Laterality: Right

## 2014-05-04 MED ORDER — MIDAZOLAM HCL 2 MG/2ML IJ SOLN
1.0000 mg | INTRAMUSCULAR | Status: DC | PRN
Start: 2014-05-04 — End: 2014-05-04

## 2014-05-04 MED ORDER — OXYCODONE HCL 5 MG PO TABS: 5.0000 mg | ORAL_TABLET | Freq: Once | ORAL | Status: DC | PRN

## 2014-05-04 MED ORDER — 0.9 % SODIUM CHLORIDE (POUR BTL) OPTIME
TOPICAL | Status: DC | PRN
  Administered 2014-05-04: 200 mL

## 2014-05-04 MED ORDER — VANCOMYCIN HCL IN DEXTROSE 1-5 GM/200ML-% IV SOLN: 1000.0000 mg | INTRAVENOUS | Status: DC

## 2014-05-04 MED ORDER — CHLORHEXIDINE GLUCONATE 4 % EX LIQD: 60.0000 mL | Freq: Once | CUTANEOUS | Status: DC

## 2014-05-04 MED ORDER — BUPIVACAINE HCL (PF) 0.25 % IJ SOLN
INTRAMUSCULAR | Status: DC | PRN
  Administered 2014-05-04: 2 mL

## 2014-05-04 MED ORDER — FENTANYL CITRATE 0.05 MG/ML IJ SOLN: 25.0000 ug | INTRAMUSCULAR | Status: DC | PRN

## 2014-05-04 MED ORDER — ONDANSETRON HCL 4 MG/2ML IJ SOLN: 4.0000 mg | Freq: Four times a day (QID) | INTRAMUSCULAR | Status: DC | PRN

## 2014-05-04 MED ORDER — LACTATED RINGERS IV SOLN
INTRAVENOUS | Status: DC
  Administered 2014-05-04: 12:00:00 via INTRAVENOUS

## 2014-05-04 MED ORDER — LIDOCAINE HCL (CARDIAC) 20 MG/ML IV SOLN
INTRAVENOUS | Status: DC | PRN
  Administered 2014-05-04: 50 mg via INTRAVENOUS

## 2014-05-04 MED ORDER — FENTANYL CITRATE 0.05 MG/ML IJ SOLN
INTRAMUSCULAR | Status: DC | PRN
  Administered 2014-05-04: 50 ug via INTRAVENOUS
  Administered 2014-05-04: 25 ug via INTRAVENOUS

## 2014-05-04 MED ORDER — FENTANYL CITRATE 0.05 MG/ML IJ SOLN
INTRAMUSCULAR | Status: AC
  Filled 2014-05-04: qty 6

## 2014-05-04 MED ORDER — PROPOFOL INFUSION 10 MG/ML OPTIME
INTRAVENOUS | Status: DC | PRN
  Administered 2014-05-04: 75 ug/kg/min via INTRAVENOUS

## 2014-05-04 MED ORDER — VANCOMYCIN HCL IN DEXTROSE 1-5 GM/200ML-% IV SOLN
INTRAVENOUS | Status: AC
  Filled 2014-05-04: qty 200

## 2014-05-04 MED ORDER — HYDROCODONE-ACETAMINOPHEN 5-325 MG PO TABS: 1.0000 | ORAL_TABLET | Freq: Four times a day (QID) | ORAL | Status: DC | PRN

## 2014-05-04 MED ORDER — VANCOMYCIN HCL IN DEXTROSE 1-5 GM/200ML-% IV SOLN
1000.0000 mg | INTRAVENOUS | Status: AC
  Administered 2014-05-04: 1000 mg via INTRAVENOUS

## 2014-05-04 MED ORDER — OXYCODONE HCL 5 MG/5ML PO SOLN: 5.0000 mg | Freq: Once | ORAL | Status: DC | PRN

## 2014-05-04 MED ORDER — FENTANYL CITRATE 0.05 MG/ML IJ SOLN: 50.0000 ug | INTRAMUSCULAR | Status: DC | PRN

## 2014-05-04 MED ORDER — LIDOCAINE HCL (PF) 0.5 % IJ SOLN
INTRAMUSCULAR | Status: DC | PRN
  Administered 2014-05-04: 35 mL via INTRAVENOUS

## 2014-05-04 MED ORDER — ONDANSETRON HCL 4 MG/2ML IJ SOLN
INTRAMUSCULAR | Status: DC | PRN
  Administered 2014-05-04: 4 mg via INTRAVENOUS

## 2014-05-04 SURGICAL SUPPLY — 33 items
BANDAGE COBAN STERILE 2 (GAUZE/BANDAGES/DRESSINGS) ×2 IMPLANT
BLADE SURG 15 STRL LF DISP TIS (BLADE) ×1 IMPLANT
BLADE SURG 15 STRL SS (BLADE) ×2
BNDG CMPR 9X4 STRL LF SNTH (GAUZE/BANDAGES/DRESSINGS)
BNDG ESMARK 4X9 LF (GAUZE/BANDAGES/DRESSINGS) IMPLANT
CHLORAPREP W/TINT 26ML (MISCELLANEOUS) ×2 IMPLANT
CORDS BIPOLAR (ELECTRODE) IMPLANT
COVER BACK TABLE 60X90IN (DRAPES) ×2 IMPLANT
COVER MAYO STAND STRL (DRAPES) ×2 IMPLANT
CUFF TOURNIQUET SINGLE 18IN (TOURNIQUET CUFF) ×1 IMPLANT
DECANTER SPIKE VIAL GLASS SM (MISCELLANEOUS) IMPLANT
DRAPE EXTREMITY T 121X128X90 (DRAPE) ×2 IMPLANT
DRAPE SURG 17X23 STRL (DRAPES) ×2 IMPLANT
GAUZE SPONGE 4X4 12PLY STRL (GAUZE/BANDAGES/DRESSINGS) ×2 IMPLANT
GAUZE XEROFORM 1X8 LF (GAUZE/BANDAGES/DRESSINGS) ×2 IMPLANT
GLOVE BIOGEL PI IND STRL 7.0 (GLOVE) IMPLANT
GLOVE BIOGEL PI IND STRL 8.5 (GLOVE) ×1 IMPLANT
GLOVE BIOGEL PI INDICATOR 7.0 (GLOVE) ×2
GLOVE BIOGEL PI INDICATOR 8.5 (GLOVE) ×1
GLOVE ECLIPSE 6.5 STRL STRAW (GLOVE) ×1 IMPLANT
GLOVE SURG ORTHO 8.0 STRL STRW (GLOVE) ×2 IMPLANT
GOWN STRL REUS W/ TWL LRG LVL3 (GOWN DISPOSABLE) ×1 IMPLANT
GOWN STRL REUS W/TWL LRG LVL3 (GOWN DISPOSABLE) ×2
GOWN STRL REUS W/TWL XL LVL3 (GOWN DISPOSABLE) ×2 IMPLANT
NEEDLE 27GAX1X1/2 (NEEDLE) ×2 IMPLANT
NS IRRIG 1000ML POUR BTL (IV SOLUTION) ×2 IMPLANT
PACK BASIN DAY SURGERY FS (CUSTOM PROCEDURE TRAY) ×2 IMPLANT
STOCKINETTE 4X48 STRL (DRAPES) ×2 IMPLANT
SUT VICRYL RAPIDE 4/0 PS 2 (SUTURE) ×2 IMPLANT
SYR BULB 3OZ (MISCELLANEOUS) ×2 IMPLANT
SYR CONTROL 10ML LL (SYRINGE) ×2 IMPLANT
TOWEL OR 17X24 6PK STRL BLUE (TOWEL DISPOSABLE) ×3 IMPLANT
UNDERPAD 30X30 INCONTINENT (UNDERPADS AND DIAPERS) ×2 IMPLANT

## 2014-05-04 NOTE — Anesthesia Postprocedure Evaluation (Signed)
  Anesthesia Post-op Note  Patient: Kayla Thomas  Procedure(s) Performed: Procedure(s): RELEASE TRIGGER FINGER/A-1 PULLEY RIGHT RING FINGER (Right)  Patient Location: PACU  Anesthesia Type: MAC, Bier Block   Level of Consciousness: awake, alert  and oriented  Airway and Oxygen Therapy: Patient Spontanous Breathing  Post-op Pain: none  Post-op Assessment: Post-op Vital signs reviewed  Post-op Vital Signs: Reviewed  Last Vitals:  Filed Vitals:   05/04/14 1320  BP: 141/66  Pulse: 61  Temp:   Resp: 14    Complications: No apparent anesthesia complications

## 2014-05-04 NOTE — Anesthesia Preprocedure Evaluation (Signed)
Anesthesia Evaluation  Patient identified by MRN, date of birth, ID band Patient awake    Reviewed: Allergy & Precautions, H&P , NPO status , Patient's Chart, lab work & pertinent test results  Airway Mallampati: II   Neck ROM: full    Dental   Pulmonary shortness of breath,          Cardiovascular + DOE     Neuro/Psych  Headaches,    GI/Hepatic GERD-  ,  Endo/Other  obese  Renal/GU      Musculoskeletal   Abdominal   Peds  Hematology   Anesthesia Other Findings   Reproductive/Obstetrics                             Anesthesia Physical Anesthesia Plan  ASA: II  Anesthesia Plan: MAC and Bier Block   Post-op Pain Management:    Induction: Intravenous  Airway Management Planned: Simple Face Mask  Additional Equipment:   Intra-op Plan:   Post-operative Plan:   Informed Consent: I have reviewed the patients History and Physical, chart, labs and discussed the procedure including the risks, benefits and alternatives for the proposed anesthesia with the patient or authorized representative who has indicated his/her understanding and acceptance.     Plan Discussed with: CRNA, Anesthesiologist and Surgeon  Anesthesia Plan Comments:         Anesthesia Quick Evaluation

## 2014-05-04 NOTE — Op Note (Signed)
Kayla Thomas, SELLEY NO.:  [id number]  MEDICAL RECORD NO.:  [id number]  LOCATION:                                 FACILITY:  PHYSICIAN:  Daryll Brod, M.D.            DATE OF BIRTH:  DATE OF PROCEDURE:  05/04/2014 DATE OF DISCHARGE:                              OPERATIVE REPORT   PREOPERATIVE DIAGNOSIS:  Stenosing tenosynovitis, right ring finger.  POSTOPERATIVE DIAGNOSIS:  Stenosing tenosynovitis, right ring finger.  OPERATION:  Release of A1 pulley, right ring finger.  SURGEON:  Daryll Brod, M.D.  ANESTHESIA:  Forearm-based IV regional with local infiltration.  ANESTHESIOLOGIST:  Albertha Ghee, MD.  HISTORY:  The patient is a 73 year old female with a history of triggering of her right ring finger.  This has not responded to conservative treatment.  She has elected to undergo surgical decompression of the A1 pulley.  Pre, peri, and postoperative course have been discussed along with the risks and complications.  She is aware that there is no guarantee with the surgery; possibility of infection; recurrence of injury to arteries, nerves, tendons; incomplete relief of symptoms and dystrophy.  In the preoperative area, the patient is seen, the extremity marked by both patient and surgeon, and antibiotic given.  PROCEDURE IN DETAIL:  The patient was brought to the operating room where a forearm-based IV regional anesthetic was carried out without difficulty.  She was prepped using ChloraPrep, supine position with the right arm free.  A 3-minute dry time was allowed.  Time-out taken confirming the patient and procedure.  Oblique incision was made over the A1 pulley of right ring finger, carried down through the subcutaneous tissue.  Bleeders were electrocauterized with bipolar.  The A1 pulley was found to be markedly thickened.  This was incised on its radial aspect.  A small incision was made centrally in A2.  The finger was placed through a full range  motion, no further triggering was noted. A partial tenosynovectomy was performed proximally to assure that the tenosynovial tissue was not adherent to each other and the tendons of the wound was copiously irrigated with saline.  The skin was then closed with interrupted 4-0 Vicryl Rapide sutures.  Local infiltration with 0.25% bupivacaine without epinephrine was given, approximately 5 mL was used.  Sterile compressive dressing with the fingers free was applied. On deflation of the tourniquet, all fingers were immediately pinked.  She was taken to the recovery room for observation in satisfactory condition.  She will be discharged to home, return to the [facility] in 1 week, on Nor[place].          ______________________________ Daryll Brod, M.D.     GK/MEDQ  D:  05/04/2014  T:  05/04/2014  Job:  [id number]

## 2014-05-04 NOTE — Op Note (Signed)
Dictation Number [id number]

## 2014-05-04 NOTE — Discharge Instructions (Addendum)

## 2014-05-04 NOTE — H&P (Signed)
Kayla Thomas  is complaining of catching of her right ring finger, pain on the radial aspect of her right wrist.  She states it is similar to her left side. She recalls no history of injury. This has been going on for the past several months, gradually getting worse. She frequently will have to straighten her ring finger out.  She complains of intermittent severe, stabbing aching, burning type pain. She states it is getting worse. She has been taking ibuprofen and Tylenol.  The ibuprofen has been bothering her stomach and she has switched to Tylenol.   The [facility] has settled down for her.  The de Quervain's has settled down, but the ring finger right hand continues to trigger.  ALLERGIES:    None. MEDICATIONS:     Omeprazole, multivitamins, super B complex, calcium, lutein, bilberry, ipratropium, anastrozole. SURGICAL HISTORY:  Tonsillectomy, temporal mandibular nerve (for migraines),  uterine cancer, back           surgery, cholecystectomy, cataract (bilateral) and partial mastectomy. FAMILY MEDICAL HISTORY:  Positive for diabetes, heart disease, high blood pressure and arthritis. SOCIAL HISTORY:    She does not smoke or drink.  She is widowed, retired.  REVIEW OF SYSTEMS:     Positive for cancer, glasses, cataracts, cough, otherwise negative 14 points.   Kayla Thomas is an 73 y.o. female.   Chief Complaint: STS right ring finger HPI: see above  Past Medical History  Diagnosis Date  . Uterine cancer 1998  . Chronic back pain   . GERD (gastroesophageal reflux disease) 01/21/2006    EGD Dr Oneida Alar mild chronic gastritis, (NO h pylori) otherwise normal  . Sinus congestion   . Headache(784.0)   . Breast cancer   . Thumb tendonitis September 2014    left  . Breast cancer   . Wears partial dentures     Past Surgical History  Procedure Laterality Date  . Cholecystectomy  11/1999  . S/p hysterectomy  1998    uterine ca  . Temperal/mandibular  1974  . Esophagogastroduodenoscopy   01/21/06    mild antral erythema bx h-pylori/normal esophagus without evidence of mass or Barrett's/normal pylorus and duodenum  . Cataracts    . Colonoscopy  05/13/2011    Procedure: COLONOSCOPY;  Surgeon: Dorothyann Peng, MD;  [facility];  Service: Endoscopy;  Laterality: N/A;  8:30  . Tonsillectomy  1964  . Abscess drainage  03-04-05    insect bite  . Cataract extraction w/phaco  10/06/2011    Procedure: CATARACT EXTRACTION PHACO AND INTRAOCULAR LENS PLACEMENT (IOC);  Surgeon: Williams Che, MD;  Location: [facility];  Service: Ophthalmology;  Laterality: Left;  CDE:  5.76  . Abdominal hysterectomy    . Partial mastectomy with needle localization and axillary sentinel lymph node bx Left 08/18/2012    Procedure: PARTIAL MASTECTOMY WITH NEEDLE LOCALIZATION AND AXILLARY SENTINEL LYMPH NODE BX;  Surgeon: Jamesetta So, MD;  Location: [facility];  Service: General;  Laterality: Left;  Need Frozen Section/Sentinel Node Bx @ 8:00am/Needle Loc @ 9:00am    Family History  Problem Relation Age of Onset  . Liver cancer Father   . Alcohol abuse Father   . Hypertension Mother   . COPD Mother    Social History:  reports that she has never smoked. She has never used smokeless tobacco. She reports that she does not drink alcohol or use illicit drugs.  Allergies:  Allergies  Allergen Reactions  . Aspirin Other (See Comments)  REACTION: Abdominal pain  . Iron     REACTION: abdominal pain  . Sulfa Antibiotics Rash    Rash all over  . Ancef [Cefazolin] Rash    No prescriptions prior to admission    No results found for this or any previous visit (from the past 26 hour(s)).  No results found.   Pertinent items are noted in HPI.  Height 5\' 3"  (1.6 m), weight 86.183 kg (190 lb).  General appearance: alert, cooperative and appears stated age Head: Normocephalic, without obvious abnormality Neck: no JVD Resp: clear to auscultation bilaterally Cardio: regular rate and rhythm, S1, S2  normal, no murmur, click, rub or gallop GI: soft, non-tender; bowel sounds normal; no masses,  no organomegaly Extremities: extremities normal, atraumatic, no cyanosis or edema trigger right ring Pulses: 2+ and symmetric Skin: Skin color, texture, turgor normal. No rashes or lesions Neurologic: Grossly normal Incision/Wound: na  Assessment/Plan RADIOGRAPHS:    X-rays of her hand reveal no significant degenerative changes of the carpometacarpal joint, but Robert's view is not done to further elucidate this.  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    This has been injected on right ring finger, this has been injected on two occasions without relief.  We have discussed with her the possibility of release of the A-1 pulley right ring finger.  The pre, peri and postoperative course were discussed along with the risks and complications.  The patient is aware there is no guarantee with the surgery, possibility of infection, recurrence, injury to arteries, nerves, tendons, incomplete relief of symptoms and dystrophy.  She would like to proceed.    She is scheduled for release A-1 pulley right ring finger as an outpatient under regional anesthesia.  Karmen Altamirano R 05/04/2014, 10:06 AM

## 2014-05-04 NOTE — Transfer of Care (Signed)
Immediate Anesthesia Transfer of Care Note  Patient: Kayla Thomas  Procedure(s) Performed: Procedure(s): RELEASE TRIGGER FINGER/A-1 PULLEY RIGHT RING FINGER (Right)  Patient Location: PACU  Anesthesia Type:Bier block  Level of Consciousness: awake, sedated and patient cooperative  Airway & Oxygen Therapy: Patient Spontanous Breathing and Patient connected to face mask oxygen  Post-op Assessment: Report given to PACU RN and Post -op Vital signs reviewed and stable  Post vital signs: Reviewed and stable  Complications: No apparent anesthesia complications

## 2014-05-04 NOTE — Brief Op Note (Signed)
05/04/2014  12:58 PM  PATIENT:  Kayla Thomas  73 y.o. female  PRE-OPERATIVE DIAGNOSIS:  STENOSING TENOSYNOVITIS RIGHT RING FINGER  POST-OPERATIVE DIAGNOSIS:  STENOSING TENOSYNOVITIS RIGHT RING FINGER  PROCEDURE:  Procedure(s): RELEASE TRIGGER FINGER/A-1 PULLEY RIGHT RING FINGER (Right)  SURGEON:  Surgeon(s) and Role:    * Daryll Brod, MD - Primary  PHYSICIAN ASSISTANT:   ASSISTANTS: none   ANESTHESIA:   local and regional  EBL:     BLOOD ADMINISTERED:none  DRAINS: none   LOCAL MEDICATIONS USED:  BUPIVICAINE   SPECIMEN:  No Specimen  DISPOSITION OF SPECIMEN:  N/A  COUNTS:  YES  TOURNIQUET:   Total Tourniquet Time Documented: Forearm (Right) - 13 minutes Total: Forearm (Right) - 13 minutes   DICTATION: .Other Dictation: Dictation Number [id number]  PLAN OF CARE: Discharge to home after PACU  PATIENT DISPOSITION:  PACU - hemodynamically stable.

## 2014-05-05 ENCOUNTER — Encounter (HOSPITAL_BASED_OUTPATIENT_CLINIC_OR_DEPARTMENT_OTHER): Payer: Self-pay | Admitting: Orthopedic Surgery

## 2014-05-31 ENCOUNTER — Encounter (HOSPITAL_BASED_OUTPATIENT_CLINIC_OR_DEPARTMENT_OTHER): Payer: Medicare Other

## 2014-05-31 ENCOUNTER — Encounter (HOSPITAL_COMMUNITY): Payer: Medicare Other | Attending: Hematology and Oncology

## 2014-05-31 ENCOUNTER — Encounter (HOSPITAL_COMMUNITY): Payer: Self-pay

## 2014-05-31 VITALS — BP 137/47 | HR 65 | Temp 98.5°F | Resp 16 | Wt 192.2 lb

## 2014-05-31 DIAGNOSIS — Z9049 Acquired absence of other specified parts of digestive tract: Secondary | ICD-10-CM | POA: Insufficient documentation

## 2014-05-31 DIAGNOSIS — G43909 Migraine, unspecified, not intractable, without status migrainosus: Secondary | ICD-10-CM | POA: Diagnosis not present

## 2014-05-31 DIAGNOSIS — Z08 Encounter for follow-up examination after completed treatment for malignant neoplasm: Secondary | ICD-10-CM | POA: Insufficient documentation

## 2014-05-31 DIAGNOSIS — Z853 Personal history of malignant neoplasm of breast: Secondary | ICD-10-CM | POA: Diagnosis not present

## 2014-05-31 DIAGNOSIS — K219 Gastro-esophageal reflux disease without esophagitis: Secondary | ICD-10-CM | POA: Diagnosis not present

## 2014-05-31 DIAGNOSIS — Z923 Personal history of irradiation: Secondary | ICD-10-CM | POA: Insufficient documentation

## 2014-05-31 DIAGNOSIS — Z23 Encounter for immunization: Secondary | ICD-10-CM | POA: Diagnosis not present

## 2014-05-31 DIAGNOSIS — Z8542 Personal history of malignant neoplasm of other parts of uterus: Secondary | ICD-10-CM | POA: Insufficient documentation

## 2014-05-31 DIAGNOSIS — C50919 Malignant neoplasm of unspecified site of unspecified female breast: Secondary | ICD-10-CM

## 2014-05-31 DIAGNOSIS — Z90710 Acquired absence of both cervix and uterus: Secondary | ICD-10-CM | POA: Insufficient documentation

## 2014-05-31 DIAGNOSIS — C50912 Malignant neoplasm of unspecified site of left female breast: Secondary | ICD-10-CM

## 2014-05-31 DIAGNOSIS — R55 Syncope and collapse: Secondary | ICD-10-CM | POA: Diagnosis not present

## 2014-05-31 DIAGNOSIS — C50412 Malignant neoplasm of upper-outer quadrant of left female breast: Secondary | ICD-10-CM

## 2014-05-31 DIAGNOSIS — Z79811 Long term (current) use of aromatase inhibitors: Secondary | ICD-10-CM | POA: Insufficient documentation

## 2014-05-31 LAB — CBC WITH DIFFERENTIAL/PLATELET
BASOS ABS: 0 10*3/uL (ref 0.0–0.1)
BASOS PCT: 0 % (ref 0–1)
EOS ABS: 0.1 10*3/uL (ref 0.0–0.7)
EOS PCT: 2 % (ref 0–5)
HCT: 40.4 % (ref 36.0–46.0)
Hemoglobin: 13.7 g/dL (ref 12.0–15.0)
LYMPHS ABS: 0.9 10*3/uL (ref 0.7–4.0)
Lymphocytes Relative: 17 % (ref 12–46)
MCH: 32 pg (ref 26.0–34.0)
MCHC: 33.9 g/dL (ref 30.0–36.0)
MCV: 94.4 fL (ref 78.0–100.0)
Monocytes Absolute: 0.4 10*3/uL (ref 0.1–1.0)
Monocytes Relative: 7 % (ref 3–12)
Neutro Abs: 3.8 10*3/uL (ref 1.7–7.7)
Neutrophils Relative %: 74 % (ref 43–77)
PLATELETS: 205 10*3/uL (ref 150–400)
RBC: 4.28 MIL/uL (ref 3.87–5.11)
RDW: 11.9 % (ref 11.5–15.5)
WBC: 5.1 10*3/uL (ref 4.0–10.5)

## 2014-05-31 LAB — COMPREHENSIVE METABOLIC PANEL
ALBUMIN: 3.8 g/dL (ref 3.5–5.2)
ALT: 17 U/L (ref 0–35)
AST: 20 U/L (ref 0–37)
Alkaline Phosphatase: 123 U/L — ABNORMAL HIGH (ref 39–117)
Anion gap: 13 (ref 5–15)
BUN: 13 mg/dL (ref 6–23)
CALCIUM: 9.3 mg/dL (ref 8.4–10.5)
CO2: 24 mEq/L (ref 19–32)
Chloride: 102 mEq/L (ref 96–112)
Creatinine, Ser: 0.82 mg/dL (ref 0.50–1.10)
GFR calc non Af Amer: 69 mL/min — ABNORMAL LOW (ref >90)
GFR, EST AFRICAN AMERICAN: 80 mL/min — AB (ref >90)
Glucose, Bld: 178 mg/dL — ABNORMAL HIGH (ref 70–99)
Potassium: 4.5 mEq/L (ref 3.7–5.3)
SODIUM: 139 meq/L (ref 137–147)
TOTAL PROTEIN: 6.6 g/dL (ref 6.0–8.3)
Total Bilirubin: 0.5 mg/dL (ref 0.3–1.2)

## 2014-05-31 LAB — CEA

## 2014-05-31 LAB — CANCER ANTIGEN 27.29: CA 27.29: 12 U/mL (ref 0–39)

## 2014-05-31 MED ORDER — INFLUENZA VAC SPLIT QUAD 0.5 ML IM SUSY
0.5000 mL | PREFILLED_SYRINGE | Freq: Once | INTRAMUSCULAR | Status: AC
  Administered 2014-05-31: 0.5 mL via INTRAMUSCULAR
  Filled 2014-05-31: qty 0.5

## 2014-05-31 NOTE — Progress Notes (Signed)
Kayla Thomas presents today for injection per MD orders. Flu vaccine administered SQ in right deltoid. Administration without incident. Patient tolerated well.

## 2014-05-31 NOTE — Patient Instructions (Signed)
[facility] Discharge Instructions  RECOMMENDATIONS MADE BY THE CONSULTANT AND ANY TEST RESULTS WILL BE SENT TO YOUR REFERRING PHYSICIAN.  EXAM FINDINGS BY THE PHYSICIAN TODAY AND SIGNS OR SYMPTOMS TO REPORT TO CLINIC OR PRIMARY PHYSICIAN: Exam and findings as discussed by Dr. Barnet Glasgow.  You are doing well.  Will give you a flu vaccine today. Report any new lumps, bone pain, shortness of breath or other symptoms.  MEDICATIONS PRESCRIBED:  Continue arimidex (anastrazole)  INSTRUCTIONS/FOLLOW-UP: Labs and office visit in 6 months.  Thank you for choosing [facility] to provide your oncology and hematology care.  To afford each patient quality time with our providers, please arrive at least 15 minutes before your scheduled appointment time.  With your help, our goal is to use those 15 minutes to complete the necessary work-up to ensure our physicians have the information they need to help with your evaluation and healthcare recommendations.    Effective January 1st, 2014, we ask that you re-schedule your appointment with our physicians should you arrive 10 or more minutes late for your appointment.  We strive to give you quality time with our providers, and arriving late affects you and other patients whose appointments are after yours.    Again, thank you for choosing [facility].  Our hope is that these requests will decrease the amount of time that you wait before being seen by our physicians.       _____________________________________________________________  Should you have questions after your visit to [facility], please contact our office at [phone] between the hours of 8:30 a.m. and 4:30 p.m.  Voicemails left after 4:30 p.m. will not be returned until the following business day.  For prescription refill requests, have your pharmacy contact our office with your prescription refill request.     _______________________________________________________________  We hope that we have given you very good care.  You may receive a patient satisfaction survey in the mail, please complete it and return it as soon as possible.  We value your feedback!  _______________________________________________________________  Have you asked about our STAR program?  STAR stands for Survivorship Training and Rehabilitation, and this is a nationally recognized cancer care program that focuses on survivorship and rehabilitation.  Cancer and cancer treatments may cause problems, such as, pain, making you feel tired and keeping you from doing the things that you need or want to do. Cancer rehabilitation can help. Our goal is to reduce these troubling effects and help you have the best quality of life possible.  You may receive a survey from a nurse that asks questions about your current state of health.  Based on the survey results, all eligible patients will be referred to the [facility] program for an evaluation so we can better serve you!  A frequently asked questions sheet is available upon request.

## 2014-05-31 NOTE — Progress Notes (Signed)
[facility]  OFFICE PROGRESS [name], MD [address] [place] [address]  DIAGNOSIS: No diagnosis found.  Chief Complaint  Patient presents with  . Breast Cancer    CURRENT THERAPY: Anastrozole 1 mg daily.  INTERVAL HISTORY: Kayla Thomas 73 y.o. female returns forfollowup of stage I left breast cancer, status post lumpectomy, radiotherapy, and anastrozole, the latter begun on 12/21/2012.    She did not like the side effects of venlafaxine so she stopped taking it. Hot flashes are still present but not overwhelming. She also has vague muscle aches but no joint pain or swelling. She did have trigger finger surgery 3 weeks ago with excellent results. Appetite is good with no nausea, vomiting, diarrhea, constipation, vaginal dryness or itching, vaginal bleeding, lower extremity swelling or redness but with rather persistent loose bowel movements since receiving radiotherapy for cancer the uterus in the remote past. No melena, hematochezia, epistaxis, hemoptysis, shortness of breath, PND, orthopnea, palpitations, headache, or seizures.  MEDICAL HISTORY: Past Medical History  Diagnosis Date  . Uterine cancer 1998  . Chronic back pain   . GERD (gastroesophageal reflux disease) 01/21/2006    EGD Dr Oneida Alar mild chronic gastritis, (NO h pylori) otherwise normal  . Sinus congestion   . Headache(784.0)   . Breast cancer   . Thumb tendonitis September 2014    left  . Breast cancer   . Wears partial dentures     INTERIM HISTORY: has MIGRAINE HEADACHE; GERD; GASTRITIS; LOOSE STOOLS; ALLERGY; UTERINE CANCER, HX OF; Screen for colon cancer; Annual physical exam; and DOE (dyspnea on exertion) on her problem list.    ALLERGIES:  is allergic to aspirin; iron; sulfa antibiotics; and ancef.  MEDICATIONS: has a current medication list which includes the following prescription(s): anastrozole, b complex-c, calcium  carb-cholecalciferol, diphenhydramine-pse-apap, glucosamine-chondroitin, ibuprofen, ipratropium, loperamide, lutein, multivitamins ther. w/minerals, omeprazole, turmeric, and hydrocodone-acetaminophen, and the following Facility-Administered Medications: influenza vac split quadrivalent pf.  SURGICAL HISTORY:  Past Surgical History  Procedure Laterality Date  . Cholecystectomy  11/1999  . S/p hysterectomy  1998    uterine ca  . Temperal/mandibular  1974  . Esophagogastroduodenoscopy  01/21/06    mild antral erythema bx h-pylori/normal esophagus without evidence of mass or Barrett's/normal pylorus and duodenum  . Cataracts    . Colonoscopy  05/13/2011    Procedure: COLONOSCOPY;  Surgeon: Dorothyann Peng, MD;  [facility];  Service: Endoscopy;  Laterality: N/A;  8:30  . Tonsillectomy  1964  . Abscess drainage  03-04-05    insect bite  . Cataract extraction w/phaco  10/06/2011    Procedure: CATARACT EXTRACTION PHACO AND INTRAOCULAR LENS PLACEMENT (IOC);  Surgeon: Williams Che, MD;  Location: [facility];  Service: Ophthalmology;  Laterality: Left;  CDE:  5.76  . Abdominal hysterectomy    . Partial mastectomy with needle localization and axillary sentinel lymph node bx Left 08/18/2012    Procedure: PARTIAL MASTECTOMY WITH NEEDLE LOCALIZATION AND AXILLARY SENTINEL LYMPH NODE BX;  Surgeon: Jamesetta So, MD;  Location: [facility];  Service: General;  Laterality: Left;  Need Frozen Section/Sentinel Node Bx @ 8:00am/Needle Loc @ 9:00am  . Trigger finger release Right 05/04/2014    Procedure: RELEASE TRIGGER FINGER/A-1 PULLEY RIGHT RING FINGER;  Surgeon: Daryll Brod, MD;  Location: [facility];  Service: Orthopedics;  Laterality: Right;    FAMILY HISTORY: family history includes Alcohol abuse in  her father; COPD in her mother; Hypertension in her mother; Liver cancer in her father.  SOCIAL HISTORY:  reports that she has never smoked. She has never used smokeless tobacco. She reports  that she does not drink alcohol or use illicit drugs.  REVIEW OF SYSTEMS:  Other than that discussed above is noncontributory.  PHYSICAL EXAMINATION: ECOG PERFORMANCE STATUS: 1 - Symptomatic but completely ambulatory  Blood pressure 137/47, pulse 65, temperature 98.5 F (36.9 C), temperature source Oral, resp. rate 16, weight 192 lb 3.2 oz (87.181 kg), SpO2 98 %.  GENERAL:alert, no distress and comfortable. Moderately obese. SKIN: skin color, texture, turgor are normal, no rashes or significant lesions EYES: PERLA; Conjunctiva are pink and non-injected, sclera clear SINUSES: No redness or tenderness over maxillary or ethmoid sinuses OROPHARYNX:no exudate, no erythema on lips, buccal mucosa, or tongue. NECK: supple, thyroid normal size, non-tender, without nodularity. No masses CHEST: Status post left breast lumpectomy with no masses in either breast. LYMPH:  no palpable lymphadenopathy in the cervical, axillary or inguinal LUNGS: clear to auscultation and percussion with normal breathing effort HEART: regular rate & rhythm and no murmurs. ABDOMEN:abdomen soft, non-tender and normal bowel sounds MUSCULOSKELETAL:no cyanosis of digits and no clubbing. Range of motion normal.  NEURO: alert & oriented x 3 with fluent speech, no focal motor/sensory deficits   LABORATORY DATA: Lab on 05/31/2014  Component Date Value Ref Range Status  . WBC 05/31/2014 5.1  4.0 - 10.5 K/uL Final  . RBC 05/31/2014 4.28  3.87 - 5.11 MIL/uL Final  . Hemoglobin 05/31/2014 13.7  12.0 - 15.0 g/dL Final  . HCT 05/31/2014 40.4  36.0 - 46.0 % Final  . MCV 05/31/2014 94.4  78.0 - 100.0 fL Final  . MCH 05/31/2014 32.0  26.0 - 34.0 pg Final  . MCHC 05/31/2014 33.9  30.0 - 36.0 g/dL Final  . RDW 05/31/2014 11.9  11.5 - 15.5 % Final  . Platelets 05/31/2014 205  150 - 400 K/uL Final  . Neutrophils Relative % 05/31/2014 74  43 - 77 % Final  . Neutro Abs 05/31/2014 3.8  1.7 - 7.7 K/uL Final  . Lymphocytes Relative  05/31/2014 17  12 - 46 % Final  . Lymphs Abs 05/31/2014 0.9  0.7 - 4.0 K/uL Final  . Monocytes Relative 05/31/2014 7  3 - 12 % Final  . Monocytes Absolute 05/31/2014 0.4  0.1 - 1.0 K/uL Final  . Eosinophils Relative 05/31/2014 2  0 - 5 % Final  . Eosinophils Absolute 05/31/2014 0.1  0.0 - 0.7 K/uL Final  . Basophils Relative 05/31/2014 0  0 - 1 % Final  . Basophils Absolute 05/31/2014 0.0  0.0 - 0.1 K/uL Final  . Sodium 05/31/2014 139  137 - 147 mEq/L Final  . Potassium 05/31/2014 4.5  3.7 - 5.3 mEq/L Final  . Chloride 05/31/2014 102  96 - 112 mEq/L Final  . CO2 05/31/2014 24  19 - 32 mEq/L Final  . Glucose, Bld 05/31/2014 178* 70 - 99 mg/dL Final  . BUN 05/31/2014 13  6 - 23 mg/dL Final  . Creatinine, Ser 05/31/2014 0.82  0.50 - 1.10 mg/dL Final  . Calcium 05/31/2014 9.3  8.4 - 10.5 mg/dL Final  . Total Protein 05/31/2014 6.6  6.0 - 8.3 g/dL Final  . Albumin 05/31/2014 3.8  3.5 - 5.2 g/dL Final  . AST 05/31/2014 20  0 - 37 U/L Final  . ALT 05/31/2014 17  0 - 35 U/L Final  . Alkaline Phosphatase  05/31/2014 123* 39 - 117 U/L Final  . Total Bilirubin 05/31/2014 0.5  0.3 - 1.2 mg/dL Final  . GFR calc non Af Amer 05/31/2014 69* >90 mL/min Final  . GFR calc Af Amer 05/31/2014 80* >90 mL/min Final   Comment: (NOTE) The eGFR has been calculated using the CKD EPI equation. This calculation has not been validated in all clinical situations. eGFR's persistently <90 mL/min signify possible Chronic Kidney Disease.   . Anion gap 05/31/2014 13  5 - 15 Final  Admission on 05/04/2014, Discharged on 05/04/2014  Component Date Value Ref Range Status  . Hemoglobin 05/04/2014 15.3* 12.0 - 15.0 g/dL Final    PATHOLOGY: No new pathology.  Urinalysis No results found for: COLORURINE, APPEARANCEUR, LABSPEC, PHURINE, GLUCOSEU, HGBUR, BILIRUBINUR, KETONESUR, PROTEINUR, UROBILINOGEN, NITRITE, LEUKOCYTESUR  RADIOGRAPHIC STUDIES: No results found. ostic Bilat   Status: Final result       Study  Result     CLINICAL DATA: Left breast cancer, diagnosed in 2014, status post lumpectomy radiation. Annual exam.  EXAM: DIGITAL DIAGNOSTIC BILATERAL MAMMOGRAM WITH CAD  COMPARISON: With priors  ACR Breast Density Category b: There are scattered areas of fibroglandular density.  FINDINGS: There are lumpectomy changes in the upper outer left breast, posterior third. Skin thickening of the left breast is compatible with radiation change. No mass, nonsurgical distortion, or suspicious microcalcification is identified in either breast to suggest malignancy.  Mammographic images were processed with CAD.  IMPRESSION: No evidence of malignancy in either breast. Lumpectomy changes on the left.  RECOMMENDATION: Diagnostic mammogram is suggested in 1 year. (Code:DM-B-01Y)  I have discussed the findings and recommendations with the patient. Results were also provided in writing at the conclusion of the visit. If applicable, a reminder letter will be sent to the patient regarding the next appointment.  BI-RADS CATEGORY 2: Benign.   Electronically Signed  By: Curlene Dolphin M.D.  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 Tumor was Estrogen Receptor 100%,Progesterone Receptor NEGATIVE, Ki67 by M IB-1 (Low<20%): 7% Her-2-neu not amplified by FISH.  She had lumpectomy with axillary lymph node biopsy on 08/18/2012, status post radiotherapy currently taking anastrozole 1 mg daily with generalized achiness with improvement in hot flashes.  #2. Vasomotor instability, improved with adverse effect to Effexor producing worsening migraines, no longer taking Effexor.  #3. Gastroesophageal reflux disease.  #4. History of cancer of the uterus, no evidence of disease.  #5. History of migraine syndrome  PLAN:  #1. Continue anastrozole 1 mg daily and ibuprofen as needed for joint or muscle aches. Next line #2.  Follow-up in 6 months with CBC, chem profile, CEA, CA-27-29. Mammogram is due in May 2016. #2. Influenza) vaccine was given today.   All questions were answered. The patient knows to call the clinic with any problems, questions or concerns. We can certainly see the patient much sooner if necessary.   I spent 25 minutes counseling the patient face to face. The total time spent in the appointment was 30 minutes.    Doroteo Bradford, MD 05/31/2014 11:00 AM  DISCLAIMER:  This note was dictated with voice recognition software.  Similar sounding words can inadvertently be transcribed inaccurately and may not be corrected upon review.

## 2014-05-31 NOTE — Progress Notes (Signed)
LABS FOR CBCD,CMP,CEA,CA2729

## 2014-07-03 DIAGNOSIS — M1811 Unilateral primary osteoarthritis of first carpometacarpal joint, right hand: Secondary | ICD-10-CM | POA: Diagnosis not present

## 2014-08-21 DIAGNOSIS — M1811 Unilateral primary osteoarthritis of first carpometacarpal joint, right hand: Secondary | ICD-10-CM | POA: Diagnosis not present

## 2014-09-01 DIAGNOSIS — R1011 Right upper quadrant pain: Secondary | ICD-10-CM | POA: Diagnosis not present

## 2014-09-07 ENCOUNTER — Other Ambulatory Visit: Payer: Medicare Other | Admitting: Obstetrics and Gynecology

## 2014-09-14 DIAGNOSIS — Z8711 Personal history of peptic ulcer disease: Secondary | ICD-10-CM | POA: Diagnosis not present

## 2014-09-26 DIAGNOSIS — Z961 Presence of intraocular lens: Secondary | ICD-10-CM | POA: Diagnosis not present

## 2014-09-26 DIAGNOSIS — H538 Other visual disturbances: Secondary | ICD-10-CM | POA: Diagnosis not present

## 2014-09-26 DIAGNOSIS — H3531 Nonexudative age-related macular degeneration: Secondary | ICD-10-CM | POA: Diagnosis not present

## 2014-09-27 ENCOUNTER — Encounter: Payer: Self-pay | Admitting: Obstetrics and Gynecology

## 2014-09-27 ENCOUNTER — Ambulatory Visit (INDEPENDENT_AMBULATORY_CARE_PROVIDER_SITE_OTHER): Payer: Medicare Other | Admitting: Obstetrics and Gynecology

## 2014-09-27 VITALS — BP 128/80 | Ht 63.0 in | Wt 189.0 lb

## 2014-09-27 DIAGNOSIS — Z01419 Encounter for gynecological examination (general) (routine) without abnormal findings: Secondary | ICD-10-CM

## 2014-09-27 DIAGNOSIS — Z853 Personal history of malignant neoplasm of breast: Secondary | ICD-10-CM

## 2014-09-27 DIAGNOSIS — Z8542 Personal history of malignant neoplasm of other parts of uterus: Secondary | ICD-10-CM

## 2014-09-27 NOTE — Progress Notes (Signed)
Patient ID: Kayla Thomas, female   DOB: 1940/09/08, 74 y.o.   MRN: [id number]  This chart was scribed for Jonnie Kind, MD by Donato Schultz, ED Scribe. This patient was seen in room 2 and the patient's care was started at 9:23 AM.   Assessment:  Annual Gyn Exam S/p uterine cancer.with postop vag stenosis S/p Breast cancer left breast.   Plan:  1. pap smear done, next pap due 2. return annually or prn 3    Annual mammogram advised Subjective:  Kayla Thomas is a 74 y.o. female No obstetric history on file. who presents for annual exam. No LMP recorded. Patient has had a hysterectomy. The patient has complaints today of arthritis and tendonitis in her hands bilaterally.  She receives steroid injections in her hands with relief to her symptoms.  She has been taking Ibuprofen for her pain but states that she cannot take it continuously due to GI problems.  [facility] [employer] [facility]  Her last colonoscopy was a year ago.    The following portions of the patient's history were reviewed and updated as appropriate: allergies, current medications, past family history, past medical history, past social history, past surgical history and problem list. Past Medical History  Diagnosis Date  . Uterine cancer 1998  . Chronic back pain   . GERD (gastroesophageal reflux disease) 01/21/2006    EGD Dr Oneida Alar mild chronic gastritis, (NO h pylori) otherwise normal  . Sinus congestion   . Headache(784.0)   . Breast cancer   . Thumb tendonitis September 2014    left  . Breast cancer   . Wears partial dentures     Past Surgical History  Procedure Laterality Date  . Cholecystectomy  11/1999  . S/p hysterectomy  1998    uterine ca  . Temperal/mandibular  1974  . Esophagogastroduodenoscopy  01/21/06    mild antral erythema bx h-pylori/normal esophagus without evidence of mass or Barrett's/normal pylorus and duodenum  . Cataracts    . Colonoscopy  05/13/2011     Procedure: COLONOSCOPY;  Surgeon: Dorothyann Peng, MD;  [facility];  Service: Endoscopy;  Laterality: N/A;  8:30  . Tonsillectomy  1964  . Abscess drainage  03-04-05    insect bite  . Cataract extraction w/phaco  10/06/2011    Procedure: CATARACT EXTRACTION PHACO AND INTRAOCULAR LENS PLACEMENT (IOC);  Surgeon: Williams Che, MD;  [facility];  Service: Ophthalmology;  Laterality: Left;  CDE:  5.76  . Abdominal hysterectomy    . Partial mastectomy with needle localization and axillary sentinel lymph node bx Left 08/18/2012    Procedure: PARTIAL MASTECTOMY WITH NEEDLE LOCALIZATION AND AXILLARY SENTINEL LYMPH NODE BX;  Surgeon: Jamesetta So, MD;  [facility];  Service: General;  Laterality: Left;  Need Frozen Section/Sentinel Node Bx @ 8:00am/Needle Loc @ 9:00am  . Trigger finger release Right 05/04/2014    Procedure: RELEASE TRIGGER FINGER/A-1 PULLEY RIGHT RING FINGER;  Surgeon: Daryll Brod, MD;  Location: [facility];  Service: Orthopedics;  Laterality: Right;     Current outpatient prescriptions:  .  anastrozole (ARIMIDEX) 1 MG tablet, TAKE ONE TABLET DAILY., Disp: 90 tablet, Rfl: 3 .  B Complex-C (SUPER B COMPLEX PO), Take 1 tablet by mouth every morning. , Disp: , Rfl:  .  Calcium Carb-Cholecalciferol (CALCIUM 600/VITAMIN D3) 600-800 MG-UNIT TABS, Take 1 capsule by mouth 2 (two) times daily., Disp: , Rfl:  .  Diphenhydramine-PSE-APAP (  ALLERGY/SINUS HEADACHE PO), Take 1 tablet by mouth as needed. For headaches, Disp: , Rfl:  .  Glucosamine-Chondroitin (GLUCOSAMINE CHONDR COMPLEX PO), Take 1 tablet by mouth 2 (two) times daily. , Disp: , Rfl:  .  ibuprofen (ADVIL,MOTRIN) 200 MG tablet, Take by mouth 2 (two) times daily as needed. Takes 3 tablets, Disp: , Rfl:  .  ipratropium (ATROVENT) 0.03 % nasal spray, Place 2 sprays into the nose every 12 (twelve) hours., Disp: , Rfl:  .  loperamide (IMODIUM) 2 MG capsule, Take 2 mg by mouth 4 (four) times daily as  needed. For diarrhea, Disp: , Rfl:  .  LUTEIN PO, Take 45 mg by mouth daily. , Disp: , Rfl:  .  Multiple Vitamins-Minerals (MULTIVITAMINS THER. W/MINERALS) TABS, Take 1 tablet by mouth every morning. , Disp: , Rfl:  .  omeprazole (PRILOSEC) 20 MG capsule, Take 20 mg by mouth every morning., Disp: , Rfl:  .  TURMERIC PO, Take by mouth., Disp: , Rfl:   Review of Systems Constitutional: negative Gastrointestinal: negative Genitourinary: negative  Objective:  BP 128/80 mmHg  Ht 5\' 3"  (1.6 m)  Wt 189 lb (85.73 kg)  BMI 33.49 kg/m2   BMI: Body mass index is 33.49 kg/(m^2).  General Appearance: Alert, appropriate appearance for age. No acute distress HEENT: Grossly normal Neck / Thyroid:  Cardiovascular: RRR; normal S1, S2, no murmur Lungs: CTA bilaterally Back: No CVAT Breast Exam: No masses or nodes.No dimpling, nipple retraction or discharge. Gastrointestinal: Soft, non-tender, no masses or organomegaly Pelvic Exam: External genitalia: normal general appearance Vaginal: normal mucosa without prolapse or lesions, short vaginal length Cervix: absent Adnexa: removed surgically Uterus: removed surgically Rectal: normal, no masses,  Rectovaginal: not indicated Lymphatic Exam: Non-palpable nodes in neck, clavicular, axillary, or inguinal regions Skin: no rash or abnormalities Neurologic: Normal gait and speech, no tremor  Psychiatric: Alert and oriented, appropriate affect.  Hemoccult: Negative Urinalysis:Not done  Mallory Shirk. MD Pgr [phone] 9:23 AM

## 2014-09-27 NOTE — Progress Notes (Signed)
Patient ID: Kayla Thomas, female   DOB: 1940/12/17, 74 y.o.   MRN: [id number] Pt her today for annual exam. Pt denies any problems or concerns at this time.

## 2014-09-28 ENCOUNTER — Other Ambulatory Visit: Payer: Medicare Other | Admitting: Obstetrics and Gynecology

## 2014-10-02 DIAGNOSIS — M1811 Unilateral primary osteoarthritis of first carpometacarpal joint, right hand: Secondary | ICD-10-CM | POA: Diagnosis not present

## 2014-10-02 DIAGNOSIS — M65341 Trigger finger, right ring finger: Secondary | ICD-10-CM | POA: Diagnosis not present

## 2014-10-06 ENCOUNTER — Encounter: Payer: Self-pay | Admitting: Cardiovascular Disease

## 2014-10-06 ENCOUNTER — Ambulatory Visit (INDEPENDENT_AMBULATORY_CARE_PROVIDER_SITE_OTHER): Payer: Medicare Other | Admitting: Cardiovascular Disease

## 2014-10-06 VITALS — BP 138/70 | HR 62 | Ht 63.0 in | Wt 188.8 lb

## 2014-10-06 DIAGNOSIS — R0602 Shortness of breath: Secondary | ICD-10-CM

## 2014-10-06 DIAGNOSIS — R5383 Other fatigue: Secondary | ICD-10-CM | POA: Diagnosis not present

## 2014-10-06 DIAGNOSIS — K2990 Gastroduodenitis, unspecified, without bleeding: Secondary | ICD-10-CM | POA: Diagnosis not present

## 2014-10-06 DIAGNOSIS — K2970 Gastritis, unspecified, without bleeding: Secondary | ICD-10-CM | POA: Diagnosis not present

## 2014-10-06 DIAGNOSIS — R942 Abnormal results of pulmonary function studies: Secondary | ICD-10-CM

## 2014-10-06 DIAGNOSIS — R9431 Abnormal electrocardiogram [ECG] [EKG]: Secondary | ICD-10-CM

## 2014-10-06 DIAGNOSIS — R9439 Abnormal result of other cardiovascular function study: Secondary | ICD-10-CM

## 2014-10-06 NOTE — Patient Instructions (Signed)
Your physician recommends that you schedule a follow-up appointment in: 3 -4 weeks with Dr. Bronson Ing  Your physician recommends that you continue on your current medications as directed. Please refer to the Current Medication list given to you today.  Your physician has recommended that you have a pulmonary function test. Pulmonary Function Tests are a group of tests that measure how well air moves in and out of your lungs.   Thank you for choosing [facility]!

## 2014-10-06 NOTE — Progress Notes (Signed)
Patient ID: Kayla Thomas, female   DOB: 03-15-1941, 74 y.o.   MRN: [id number]      SUBJECTIVE: Patient is here to follow up for dyspnea, fatigue, and an abnormal ECG. Exercise Cardiolite stress test on 10/18/13 demonstrated normal myocardial perfusion but markedly diminished exercise tolerance, exercising only 3 min 58 seconds and achieving a work level of 4.6 Mets.  Echocardiography on 10/18/13 demonstrated normal LV systolic function (EF 46-96%) with grade I diastolic dysfunction.   She feels more fatigued since her last visit with me in October 2015. She denies chest pain but does get dyspneic with exertion. She does not notice it when doing pool exercises wore while weeding, particularly when walking or pushing a wheelbarrow. She denies leg swelling, orthopnea, syncope, and paroxysmal nocturnal dyspnea.  ECG performed in the office today demonstrates normal sinus rhythm with an isolated PAC and a nonspecific ST segment abnormality     Review of Systems: As per "subjective", otherwise negative.  Allergies  Allergen Reactions  . Aspirin Other (See Comments)    REACTION: Abdominal pain  . Iron     REACTION: abdominal pain  . Sulfa Antibiotics Rash    Rash all over  . Ancef [Cefazolin] Rash    Current Outpatient Prescriptions  Medication Sig Dispense Refill  . anastrozole (ARIMIDEX) 1 MG tablet TAKE ONE TABLET DAILY. 90 tablet 3  . B Complex-C (SUPER B COMPLEX PO) Take 1 tablet by mouth every morning.     . Calcium Carb-Cholecalciferol (CALCIUM 600/VITAMIN D3) 600-800 MG-UNIT TABS Take 1 capsule by mouth 2 (two) times daily.    . Diphenhydramine-PSE-APAP (ALLERGY/SINUS HEADACHE PO) Take 1 tablet by mouth as needed. For headaches    . Glucosamine-Chondroitin (GLUCOSAMINE CHONDR COMPLEX PO) Take 1 tablet by mouth 2 (two) times daily.     Marland Kitchen ipratropium (ATROVENT) 0.03 % nasal spray Place 2 sprays into the nose every 12 (twelve) hours.    Marland Kitchen loperamide (IMODIUM) 2 MG capsule Take  2 mg by mouth 4 (four) times daily as needed. For diarrhea    . LUTEIN PO Take 45 mg by mouth daily.     . Multiple Vitamins-Minerals (MULTIVITAMINS THER. W/MINERALS) TABS Take 1 tablet by mouth every morning.     Marland Kitchen omeprazole (PRILOSEC) 20 MG capsule Take 20 mg by mouth 2 (two) times daily before a meal.     . TURMERIC PO Take by mouth.     No current facility-administered medications for this visit.    Past Medical History  Diagnosis Date  . Uterine cancer 1998  . Chronic back pain   . GERD (gastroesophageal reflux disease) 01/21/2006    EGD Dr Oneida Alar mild chronic gastritis, (NO h pylori) otherwise normal  . Sinus congestion   . Headache(784.0)   . Breast cancer   . Thumb tendonitis September 2014    left  . Breast cancer   . Wears partial dentures     Past Surgical History  Procedure Laterality Date  . Cholecystectomy  11/1999  . S/p hysterectomy  1998    uterine ca  . Temperal/mandibular  1974  . Esophagogastroduodenoscopy  01/21/06    mild antral erythema bx h-pylori/normal esophagus without evidence of mass or Barrett's/normal pylorus and duodenum  . Cataracts    . Colonoscopy  05/13/2011    Procedure: COLONOSCOPY;  Surgeon: Dorothyann Peng, MD;  [facility];  Service: Endoscopy;  Laterality: N/A;  8:30  . Tonsillectomy  1964  . Abscess drainage  03-04-05  insect bite  . Cataract extraction w/phaco  10/06/2011    Procedure: CATARACT EXTRACTION PHACO AND INTRAOCULAR LENS PLACEMENT (IOC);  Surgeon: Williams Che, MD;  [facility];  Service: Ophthalmology;  Laterality: Left;  CDE:  5.76  . Abdominal hysterectomy    . Partial mastectomy with needle localization and axillary sentinel lymph node bx Left 08/18/2012    Procedure: PARTIAL MASTECTOMY WITH NEEDLE LOCALIZATION AND AXILLARY SENTINEL LYMPH NODE BX;  Surgeon: Jamesetta So, MD;  [facility];  Service: General;  Laterality: Left;  Need Frozen Section/Sentinel Node Bx @ 8:00am/Needle Loc @ 9:00am    . Trigger finger release Right 05/04/2014    Procedure: RELEASE TRIGGER FINGER/A-1 PULLEY RIGHT RING FINGER;  Surgeon: Daryll Brod, MD;  Location: [facility];  Service: Orthopedics;  Laterality: Right;    History   Social History  . Marital Status: Widowed    Spouse Name: N/A  . Number of Children: 1  . Years of Education: N/A   Occupational History  . retired; Facilities manager asst    Social History Main Topics  . Smoking status: Never Smoker   . Smokeless tobacco: Never Used  . Alcohol Use: No  . Drug Use: No  . Sexual Activity: Not Currently    Birth Control/ Protection: Surgical   Other Topics Concern  . Not on file   Social History Narrative   1 adopted son-grown   Lives alone     Filed Vitals:   10/06/14 1036  BP: 138/70  Pulse: 62  Height: 5\' 3"  (1.6 m)  Weight: 188 lb 12.8 oz (85.639 kg)    PHYSICAL EXAM General: NAD HEENT: Normal. Neck: No JVD, no thyromegaly. Lungs: Clear to auscultation bilaterally with normal respiratory effort. CV: Nondisplaced PMI.  Regular rate and rhythm, normal S1/S2, no S3/S4, no murmur. No pretibial or periankle edema.  No carotid bruit.  Normal pedal pulses.  Abdomen: Soft, nontender, no hepatosplenomegaly, no distention.  Neurologic: Alert and oriented x 3.  Psych: Normal affect. Skin: Normal. Musculoskeletal: Normal range of motion, no gross deformities. Extremities: No clubbing or cyanosis.   ECG: Most recent ECG reviewed.      ASSESSMENT AND PLAN: 1. Dyspnea on exertion and fatigue: This has been going on for over one year. Her ECG shows a nonspecific, mild ST segment abnormality. Given her limited exercise tolerance, I am still concerned about occult ischemic heart disease as a potential etiology. However, PFT's in 09/2011 did demonstrate evidence for a possible interstitial process such as inflammation vs fibrosis.  I will obtain a CBC, BMET, and PFT's. If these are abnormal, I will proceed with coronary  angiography with both right and left heart catheterization.   Dispo: f/u 3-4 weeks.    Kate Sable, M.D., F.A.C.C.

## 2014-10-09 DIAGNOSIS — G43109 Migraine with aura, not intractable, without status migrainosus: Secondary | ICD-10-CM | POA: Diagnosis not present

## 2014-10-09 DIAGNOSIS — K219 Gastro-esophageal reflux disease without esophagitis: Secondary | ICD-10-CM | POA: Diagnosis not present

## 2014-10-09 DIAGNOSIS — R0602 Shortness of breath: Secondary | ICD-10-CM | POA: Diagnosis not present

## 2014-10-09 DIAGNOSIS — Z79899 Other long term (current) drug therapy: Secondary | ICD-10-CM | POA: Diagnosis not present

## 2014-10-09 DIAGNOSIS — E785 Hyperlipidemia, unspecified: Secondary | ICD-10-CM | POA: Diagnosis not present

## 2014-10-10 LAB — CBC WITH DIFFERENTIAL/PLATELET
Basophils Absolute: 0.1 10*3/uL (ref 0.0–0.1)
Basophils Relative: 1 % (ref 0–1)
Eosinophils Absolute: 0.2 10*3/uL (ref 0.0–0.7)
Eosinophils Relative: 3 % (ref 0–5)
HEMATOCRIT: 41.5 % (ref 36.0–46.0)
Hemoglobin: 13.9 g/dL (ref 12.0–15.0)
Lymphocytes Relative: 24 % (ref 12–46)
Lymphs Abs: 1.3 10*3/uL (ref 0.7–4.0)
MCH: 31.7 pg (ref 26.0–34.0)
MCHC: 33.5 g/dL (ref 30.0–36.0)
MCV: 94.7 fL (ref 78.0–100.0)
MONO ABS: 0.5 10*3/uL (ref 0.1–1.0)
MPV: 9.9 fL (ref 8.6–12.4)
Monocytes Relative: 9 % (ref 3–12)
NEUTROS PCT: 63 % (ref 43–77)
Neutro Abs: 3.4 10*3/uL (ref 1.7–7.7)
Platelets: 211 10*3/uL (ref 150–400)
RBC: 4.38 MIL/uL (ref 3.87–5.11)
RDW: 14.1 % (ref 11.5–15.5)
WBC: 5.4 10*3/uL (ref 4.0–10.5)

## 2014-10-10 LAB — BASIC METABOLIC PANEL
BUN: 13 mg/dL (ref 6–23)
CHLORIDE: 103 meq/L (ref 96–112)
CO2: 28 mEq/L (ref 19–32)
Calcium: 9.4 mg/dL (ref 8.4–10.5)
Creat: 0.72 mg/dL (ref 0.50–1.10)
Glucose, Bld: 90 mg/dL (ref 70–99)
Potassium: 5 mEq/L (ref 3.5–5.3)
SODIUM: 138 meq/L (ref 135–145)

## 2014-10-17 DIAGNOSIS — Z23 Encounter for immunization: Secondary | ICD-10-CM | POA: Diagnosis not present

## 2014-10-17 DIAGNOSIS — Z853 Personal history of malignant neoplasm of breast: Secondary | ICD-10-CM | POA: Diagnosis not present

## 2014-10-17 DIAGNOSIS — R0602 Shortness of breath: Secondary | ICD-10-CM | POA: Diagnosis not present

## 2014-10-17 DIAGNOSIS — T85511A Breakdown (mechanical) of esophageal anti-reflux device, initial encounter: Secondary | ICD-10-CM | POA: Diagnosis not present

## 2014-10-20 ENCOUNTER — Ambulatory Visit (HOSPITAL_COMMUNITY)
Admission: RE | Admit: 2014-10-20 | Discharge: 2014-10-20 | Disposition: A | Discharge status: Home / Self Care | Payer: Medicare Other | Source: Ambulatory Visit | Attending: Cardiovascular Disease | Admitting: Cardiovascular Disease

## 2014-10-20 DIAGNOSIS — R0602 Shortness of breath: Secondary | ICD-10-CM | POA: Insufficient documentation

## 2014-10-20 LAB — PULMONARY FUNCTION TEST
DL/VA % PRED: 112 %
DL/VA: 5.24 ml/min/mmHg/L
DLCO UNC: 15.66 ml/min/mmHg
DLCO unc % pred: 68 %
FEF 25-75 PRE: 1.14 L/s
FEF 25-75 Post: 1.26 L/sec
FEF2575-%Change-Post: 10 %
FEF2575-%PRED-POST: 74 %
FEF2575-%Pred-Pre: 68 %
FEV1-%CHANGE-POST: 2 %
FEV1-%Pred-Post: 66 %
FEV1-%Pred-Pre: 64 %
FEV1-Post: 1.37 L
FEV1-Pre: 1.34 L
FEV1FVC-%CHANGE-POST: 3 %
FEV1FVC-%Pred-Pre: 105 %
FEV6-%Change-Post: -1 %
FEV6-%PRED-PRE: 64 %
FEV6-%Pred-Post: 63 %
FEV6-Post: 1.66 L
FEV6-Pre: 1.69 L
FEV6FVC-%Pred-Post: 105 %
FEV6FVC-%Pred-Pre: 105 %
FVC-%Change-Post: 0 %
FVC-%PRED-PRE: 61 %
FVC-%Pred-Post: 61 %
FVC-POST: 1.68 L
FVC-Pre: 1.69 L
POST FEV1/FVC RATIO: 82 %
PRE FEV6/FVC RATIO: 100 %
Post FEV6/FVC ratio: 100 %
Pre FEV1/FVC ratio: 79 %
RV % PRED: 100 %
RV: 2.22 L
TLC % pred: 80 %
TLC: 3.93 L

## 2014-10-20 MED ORDER — ALBUTEROL SULFATE (2.5 MG/3ML) 0.083% IN NEBU
2.5000 mg | INHALATION_SOLUTION | Freq: Once | RESPIRATORY_TRACT | Status: AC
  Administered 2014-10-20: 2.5 mg via RESPIRATORY_TRACT

## 2014-10-27 ENCOUNTER — Other Ambulatory Visit: Payer: Self-pay | Admitting: Cardiovascular Disease

## 2014-10-27 ENCOUNTER — Ambulatory Visit (INDEPENDENT_AMBULATORY_CARE_PROVIDER_SITE_OTHER): Payer: Medicare Other | Admitting: Cardiovascular Disease

## 2014-10-27 ENCOUNTER — Encounter: Payer: Self-pay | Admitting: *Deleted

## 2014-10-27 VITALS — BP 132/66 | HR 65 | Ht 63.0 in | Wt 188.6 lb

## 2014-10-27 DIAGNOSIS — R0602 Shortness of breath: Secondary | ICD-10-CM

## 2014-10-27 DIAGNOSIS — R9439 Abnormal result of other cardiovascular function study: Secondary | ICD-10-CM | POA: Diagnosis not present

## 2014-10-27 DIAGNOSIS — R0609 Other forms of dyspnea: Secondary | ICD-10-CM

## 2014-10-27 DIAGNOSIS — Z01818 Encounter for other preprocedural examination: Secondary | ICD-10-CM

## 2014-10-27 DIAGNOSIS — R9431 Abnormal electrocardiogram [ECG] [EKG]: Secondary | ICD-10-CM

## 2014-10-27 DIAGNOSIS — R5383 Other fatigue: Secondary | ICD-10-CM | POA: Diagnosis not present

## 2014-10-27 DIAGNOSIS — R0600 Dyspnea, unspecified: Secondary | ICD-10-CM

## 2014-10-27 NOTE — Patient Instructions (Signed)
Your physician has requested that you have a cardiac catheterization. Cardiac catheterization is used to diagnose and/or treat various heart conditions. Doctors may recommend this procedure for a number of different reasons. The most common reason is to evaluate chest pain. Chest pain can be a symptom of coronary artery disease (CAD), and cardiac catheterization can show whether plaque is narrowing or blocking your heart's arteries. This procedure is also used to evaluate the valves, as well as measure the blood flow and oxygen levels in different parts of your heart. For further information please visit [email or web address]. Please follow instruction sheet, as given.  Your physician recommends that you continue on your current medications as directed. Please refer to the Current Medication list given to you today.  Thank you for choosing [facility]!   

## 2014-10-27 NOTE — Progress Notes (Signed)
Patient ID: Kayla Thomas, female   DOB: [birth year], 74 y.o.   MRN: [id number]      SUBJECTIVE: The patient returns for follow-up of dyspnea on exertion and fatigue. CBC and BMET were normal, and PFTs did not demonstrate any overt emphysema.  Exercise Cardiolite stress test on 10/18/13 demonstrated normal myocardial perfusion but markedly diminished exercise tolerance, exercising only 3 min 58 seconds and achieving a work level of 4.6 Mets.  Echocardiography on 10/18/13 demonstrated normal LV systolic function (EF 34-35%) with grade I diastolic dysfunction.   She was at a school event for her grandson last week, and she had to stop twice while walking up a mild hill with a very slight incline due to severe shortness of breath and fatigue. She denies chest tightness.  Review of Systems: As per "subjective", otherwise negative.  Allergies  Allergen Reactions  . Aspirin Other (See Comments)    REACTION: Abdominal pain  . Iron     REACTION: abdominal pain  . Sulfa Antibiotics Rash    Rash all over  . Ancef [Cefazolin] Rash    Current Outpatient Prescriptions  Medication Sig Dispense Refill  . anastrozole (ARIMIDEX) 1 MG tablet TAKE ONE TABLET DAILY. 90 tablet 3  . B Complex-C (SUPER B COMPLEX PO) Take 1 tablet by mouth every morning.     . Calcium Carb-Cholecalciferol (CALCIUM 600/VITAMIN D3) 600-800 MG-UNIT TABS Take 1 capsule by mouth 2 (two) times daily.    . Diphenhydramine-PSE-APAP (ALLERGY/SINUS HEADACHE PO) Take 1 tablet by mouth as needed. For headaches    . Glucosamine-Chondroitin (GLUCOSAMINE CHONDR COMPLEX PO) Take 1 tablet by mouth 2 (two) times daily.     Marland Kitchen ipratropium (ATROVENT) 0.03 % nasal spray Place 2 sprays into the nose every 12 (twelve) hours.    Marland Kitchen loperamide (IMODIUM) 2 MG capsule Take 2 mg by mouth 4 (four) times daily as needed. For diarrhea    . LUTEIN PO Take 45 mg by mouth daily.     . Multiple Vitamins-Minerals (MULTIVITAMINS THER. W/MINERALS) TABS Take  1 tablet by mouth every morning.     Marland Kitchen omeprazole (PRILOSEC) 20 MG capsule Take 20 mg by mouth 2 (two) times daily before a meal.     . TURMERIC PO Take by mouth.     No current facility-administered medications for this visit.    Past Medical History  Diagnosis Date  . Uterine cancer 1998  . Chronic back pain   . GERD (gastroesophageal reflux disease) 01/21/2006    EGD Dr Oneida Alar mild chronic gastritis, (NO h pylori) otherwise normal  . Sinus congestion   . Headache(784.0)   . Breast cancer   . Thumb tendonitis September 2014    left  . Breast cancer   . Wears partial dentures     Past Surgical History  Procedure Laterality Date  . Cholecystectomy  11/1999  . S/p hysterectomy  1998    uterine ca  . Temperal/mandibular  1974  . Esophagogastroduodenoscopy  01/21/06    mild antral erythema bx h-pylori/normal esophagus without evidence of mass or Barrett's/normal pylorus and duodenum  . Cataracts    . Colonoscopy  05/13/2011    Procedure: COLONOSCOPY;  Surgeon: Dorothyann Peng, MD;  [facility]: [facility];  Service: Endoscopy;  Laterality: N/A;  8:30  . Tonsillectomy  1964  . Abscess drainage  03-04-05    insect bite  . Cataract extraction w/phaco  10/06/2011    Procedure: CATARACT EXTRACTION PHACO AND INTRAOCULAR LENS PLACEMENT (IOC);  Surgeon: Williams Che, MD;  Location: [facility];  Service: Ophthalmology;  Laterality: Left;  CDE:  5.76  . Abdominal hysterectomy    . Partial mastectomy with needle localization and axillary sentinel lymph node bx Left 08/18/2012    Procedure: PARTIAL MASTECTOMY WITH NEEDLE LOCALIZATION AND AXILLARY SENTINEL LYMPH NODE BX;  Surgeon: Jamesetta So, MD;  Location: [facility];  Service: General;  Laterality: Left;  Need Frozen Section/Sentinel Node Bx @ 8:00am/Needle Loc @ 9:00am  . Trigger finger release Right 05/04/2014    Procedure: RELEASE TRIGGER FINGER/A-1 PULLEY RIGHT RING FINGER;  Surgeon: Daryll Brod, MD;  Location: [facility];   Service: Orthopedics;  Laterality: Right;    History   Social History  . Marital Status: Widowed    Spouse Name: N/A  . Number of Children: 1  . Years of Education: N/A   Occupational History  . retired; Facilities manager asst    Social History Main Topics  . Smoking status: Never Smoker   . Smokeless tobacco: Never Used  . Alcohol Use: No  . Drug Use: No  . Sexual Activity: Not Currently    Birth Control/ Protection: Surgical   Other Topics Concern  . Not on file   Social History Narrative   1 adopted son-grown   Lives alone     Filed Vitals:   10/27/14 0932  BP: 132/66  Pulse: 65  Height: 5\' 3"  (1.6 m)  Weight: 188 lb 9.6 oz (85.548 kg)  SpO2: 97%    PHYSICAL EXAM General: NAD HEENT: Normal. Neck: No JVD, no thyromegaly. Lungs: Clear to auscultation bilaterally with normal respiratory effort. CV: Nondisplaced PMI.  Regular rate and rhythm, normal S1/S2, no S3/S4, no murmur. No pretibial or periankle edema.  No carotid bruit.  Normal pedal pulses.  Abdomen: Soft, nontender, no hepatosplenomegaly, no distention.  Neurologic: Alert and oriented x 3.  Psych: Normal affect. Skin: Normal. Musculoskeletal: Normal range of motion, no gross deformities. Extremities: No clubbing or cyanosis.   ECG: Most recent ECG reviewed.      ASSESSMENT AND PLAN: 1. Dyspnea on exertion and fatigue: This has been going on for over one year. Her ECG shows a nonspecific, mild ST segment abnormality. Given her limited exercise tolerance, I am still concerned about occult ischemic heart disease as a potential etiology. Given unremarkable PFT's and continued symptoms, I will proceed with coronary angiography with both right and left heart catheterization.   Dispo: f/u after cath.  Kate Sable, M.D., F.A.C.C.

## 2014-10-27 NOTE — Addendum Note (Signed)
Addended by: Levonne Hubert on: 10/27/2014 12:00 PM   Modules accepted: Orders

## 2014-10-27 NOTE — Addendum Note (Signed)
Addended by: Levonne Hubert on: 10/27/2014 10:19 AM   Modules accepted: Level of Service

## 2014-10-30 DIAGNOSIS — Z01818 Encounter for other preprocedural examination: Secondary | ICD-10-CM | POA: Diagnosis not present

## 2014-10-30 LAB — PROTIME-INR
INR: 1.05 (ref <1.50)
Prothrombin Time: 13.7 seconds (ref 11.6–15.2)

## 2014-10-31 ENCOUNTER — Encounter (HOSPITAL_COMMUNITY)
Admission: RE | Disposition: A | Discharge status: Home / Self Care | Payer: Private Health Insurance - Indemnity | Source: Ambulatory Visit | Attending: Cardiovascular Disease

## 2014-10-31 ENCOUNTER — Encounter (HOSPITAL_COMMUNITY): Payer: Self-pay | Admitting: Cardiovascular Disease

## 2014-10-31 ENCOUNTER — Ambulatory Visit (HOSPITAL_COMMUNITY)
Admission: RE | Admit: 2014-10-31 | Discharge: 2014-10-31 | Disposition: A | Discharge status: Home / Self Care | Payer: Medicare Other | Source: Ambulatory Visit | Attending: Cardiovascular Disease | Admitting: Cardiovascular Disease

## 2014-10-31 DIAGNOSIS — R9431 Abnormal electrocardiogram [ECG] [EKG]: Secondary | ICD-10-CM | POA: Diagnosis not present

## 2014-10-31 DIAGNOSIS — K219 Gastro-esophageal reflux disease without esophagitis: Secondary | ICD-10-CM | POA: Diagnosis not present

## 2014-10-31 DIAGNOSIS — R0609 Other forms of dyspnea: Secondary | ICD-10-CM | POA: Diagnosis not present

## 2014-10-31 DIAGNOSIS — Z8542 Personal history of malignant neoplasm of other parts of uterus: Secondary | ICD-10-CM | POA: Insufficient documentation

## 2014-10-31 DIAGNOSIS — Z853 Personal history of malignant neoplasm of breast: Secondary | ICD-10-CM | POA: Insufficient documentation

## 2014-10-31 DIAGNOSIS — Z9049 Acquired absence of other specified parts of digestive tract: Secondary | ICD-10-CM | POA: Insufficient documentation

## 2014-10-31 DIAGNOSIS — R0600 Dyspnea, unspecified: Secondary | ICD-10-CM

## 2014-10-31 DIAGNOSIS — Z9012 Acquired absence of left breast and nipple: Secondary | ICD-10-CM | POA: Insufficient documentation

## 2014-10-31 DIAGNOSIS — Z90710 Acquired absence of both cervix and uterus: Secondary | ICD-10-CM | POA: Insufficient documentation

## 2014-10-31 DIAGNOSIS — R5383 Other fatigue: Secondary | ICD-10-CM

## 2014-10-31 HISTORY — PX: CARDIAC CATHETERIZATION: SHX172

## 2014-10-31 LAB — POCT I-STAT 3, ART BLOOD GAS (G3+)
Acid-base deficit: 2 mmol/L (ref 0.0–2.0)
Bicarbonate: 23.3 mEq/L (ref 20.0–24.0)
O2 Saturation: 99 %
PCO2 ART: 41.6 mmHg (ref 35.0–45.0)
TCO2: 25 mmol/L (ref 0–100)
pH, Arterial: 7.356 (ref 7.350–7.450)
pO2, Arterial: 126 mmHg — ABNORMAL HIGH (ref 80.0–100.0)

## 2014-10-31 LAB — BASIC METABOLIC PANEL
Anion gap: 12 (ref 5–15)
BUN: 16 mg/dL (ref 6–23)
BUN: 19 mg/dL (ref 6–20)
CALCIUM: 8.9 mg/dL (ref 8.9–10.3)
CO2: 24 mEq/L (ref 19–32)
CO2: 24 mmol/L (ref 22–32)
Calcium: 9.3 mg/dL (ref 8.4–10.5)
Chloride: 102 mEq/L (ref 96–112)
Chloride: 104 mmol/L (ref 101–111)
Creat: 0.81 mg/dL (ref 0.50–1.10)
Creatinine, Ser: 0.87 mg/dL (ref 0.44–1.00)
GFR calc Af Amer: >60 mL/min (ref >60)
GFR calc non Af Amer: >60 mL/min (ref >60)
GLUCOSE: 122 mg/dL — AB (ref 70–99)
GLUCOSE: 108 mg/dL — AB (ref 70–99)
POTASSIUM: 4.2 mmol/L (ref 3.5–5.1)
Potassium: 4.6 mEq/L (ref 3.5–5.3)
SODIUM: 140 meq/L (ref 135–145)
SODIUM: 140 mmol/L (ref 135–145)

## 2014-10-31 LAB — CBC WITH DIFFERENTIAL/PLATELET
Basophils Absolute: 0 10*3/uL (ref 0.0–0.1)
Basophils Relative: 0 % (ref 0–1)
Eosinophils Absolute: 0.1 10*3/uL (ref 0.0–0.7)
Eosinophils Relative: 2 % (ref 0–5)
HCT: 41.9 % (ref 36.0–46.0)
Hemoglobin: 13.8 g/dL (ref 12.0–15.0)
Lymphocytes Relative: 19 % (ref 12–46)
Lymphs Abs: 1.2 10*3/uL (ref 0.7–4.0)
MCH: 31.4 pg (ref 26.0–34.0)
MCHC: 32.9 g/dL (ref 30.0–36.0)
MCV: 95.4 fL (ref 78.0–100.0)
MPV: 10.2 fL (ref 8.6–12.4)
Monocytes Absolute: 0.4 10*3/uL (ref 0.1–1.0)
Monocytes Relative: 6 % (ref 3–12)
Neutro Abs: 4.6 10*3/uL (ref 1.7–7.7)
Neutrophils Relative %: 73 % (ref 43–77)
PLATELETS: 215 10*3/uL (ref 150–400)
RBC: 4.39 MIL/uL (ref 3.87–5.11)
RDW: 13.7 % (ref 11.5–15.5)
WBC: 6.3 10*3/uL (ref 4.0–10.5)

## 2014-10-31 LAB — POCT I-STAT 3, VENOUS BLOOD GAS (G3P V)
ACID-BASE DEFICIT: 2 mmol/L (ref 0.0–2.0)
Bicarbonate: 24 mEq/L (ref 20.0–24.0)
O2 SAT: 70 %
TCO2: 25 mmol/L (ref 0–100)
pCO2, Ven: 44.5 mmHg — ABNORMAL LOW (ref 45.0–50.0)
pH, Ven: 7.34 — ABNORMAL HIGH (ref 7.250–7.300)
pO2, Ven: 39 mmHg (ref 30.0–45.0)

## 2014-10-31 SURGERY — RIGHT/LEFT HEART CATH AND CORONARY ANGIOGRAPHY
Anesthesia: LOCAL

## 2014-10-31 MED ORDER — SODIUM CHLORIDE 0.9 % WEIGHT BASED INFUSION: 3.0000 mL/kg/h | INTRAVENOUS | Status: AC

## 2014-10-31 MED ORDER — SODIUM CHLORIDE 0.9 % IJ SOLN: 3.0000 mL | Freq: Two times a day (BID) | INTRAMUSCULAR | Status: DC

## 2014-10-31 MED ORDER — SODIUM CHLORIDE 0.9 % WEIGHT BASED INFUSION: 1.0000 mL/kg/h | INTRAVENOUS | Status: DC

## 2014-10-31 MED ORDER — FENTANYL CITRATE (PF) 100 MCG/2ML IJ SOLN
INTRAMUSCULAR | Status: AC
  Filled 2014-10-31: qty 2

## 2014-10-31 MED ORDER — MIDAZOLAM HCL 2 MG/2ML IJ SOLN
INTRAMUSCULAR | Status: DC | PRN
  Administered 2014-10-31: 2 mg via INTRAVENOUS

## 2014-10-31 MED ORDER — MIDAZOLAM HCL 2 MG/2ML IJ SOLN
INTRAMUSCULAR | Status: AC
  Filled 2014-10-31: qty 2

## 2014-10-31 MED ORDER — LIDOCAINE HCL (PF) 1 % IJ SOLN
INTRAMUSCULAR | Status: AC
  Filled 2014-10-31: qty 30

## 2014-10-31 MED ORDER — IOHEXOL 350 MG/ML SOLN
INTRAVENOUS | Status: DC | PRN
  Administered 2014-10-31: 55 mL via INTRA_ARTERIAL

## 2014-10-31 MED ORDER — SODIUM CHLORIDE 0.9 % IJ SOLN: 3.0000 mL | INTRAMUSCULAR | Status: DC | PRN

## 2014-10-31 MED ORDER — ONDANSETRON HCL 4 MG/2ML IJ SOLN: 4.0000 mg | Freq: Four times a day (QID) | INTRAMUSCULAR | Status: DC | PRN

## 2014-10-31 MED ORDER — ACETAMINOPHEN 325 MG PO TABS: 650.0000 mg | ORAL_TABLET | ORAL | Status: DC | PRN

## 2014-10-31 MED ORDER — SODIUM CHLORIDE 0.9 % WEIGHT BASED INFUSION
3.0000 mL/kg/h | INTRAVENOUS | Status: DC
  Administered 2014-10-31: 3 mL/kg/h via INTRAVENOUS

## 2014-10-31 MED ORDER — SODIUM CHLORIDE 0.9 % IV SOLN: 250.0000 mL | INTRAVENOUS | Status: DC | PRN

## 2014-10-31 MED ORDER — HEPARIN (PORCINE) IN NACL 2-0.9 UNIT/ML-% IJ SOLN
INTRAMUSCULAR | Status: AC
  Filled 2014-10-31: qty 1000

## 2014-10-31 MED ORDER — FENTANYL CITRATE (PF) 100 MCG/2ML IJ SOLN
INTRAMUSCULAR | Status: DC | PRN
  Administered 2014-10-31: 25 ug via INTRAVENOUS

## 2014-10-31 SURGICAL SUPPLY — 10 items
CATH INFINITI 5FR MULTPACK ANG (CATHETERS) ×2 IMPLANT
CATH SWAN GANZ 7F STRAIGHT (CATHETERS) ×2 IMPLANT
KIT HEART LEFT (KITS) ×3 IMPLANT
KIT HEART RIGHT NAMIC (KITS) ×3 IMPLANT
PACK CARDIAC CATHETERIZATION (CUSTOM PROCEDURE TRAY) ×3 IMPLANT
SHEATH PINNACLE 5F 10CM (SHEATH) ×2 IMPLANT
SHEATH PINNACLE 7F 10CM (SHEATH) ×2 IMPLANT
SYR MEDRAD MARK V 150ML (SYRINGE) ×3 IMPLANT
TRANSDUCER W/STOPCOCK (MISCELLANEOUS) ×6 IMPLANT
WIRE EMERALD 3MM-J .035X150CM (WIRE) ×2 IMPLANT

## 2014-10-31 NOTE — Discharge Instructions (Signed)

## 2014-10-31 NOTE — H&P (View-Only) (Signed)
Patient ID: Kayla Thomas, female   DOB: [birth year], 74 y.o.   MRN: [id number]      SUBJECTIVE: The patient returns for follow-up of dyspnea on exertion and fatigue. CBC and BMET were normal, and PFTs did not demonstrate any overt emphysema.  Exercise Cardiolite stress test on 10/18/13 demonstrated normal myocardial perfusion but markedly diminished exercise tolerance, exercising only 3 min 58 seconds and achieving a work level of 4.6 Mets.  Echocardiography on 10/18/13 demonstrated normal LV systolic function (EF 19-14%) with grade I diastolic dysfunction.   She was at a school event for her grandson last week, and she had to stop twice while walking up a mild hill with a very slight incline due to severe shortness of breath and fatigue. She denies chest tightness.  Review of Systems: As per "subjective", otherwise negative.  Allergies  Allergen Reactions  . Aspirin Other (See Comments)    REACTION: Abdominal pain  . Iron     REACTION: abdominal pain  . Sulfa Antibiotics Rash    Rash all over  . Ancef [Cefazolin] Rash    Current Outpatient Prescriptions  Medication Sig Dispense Refill  . anastrozole (ARIMIDEX) 1 MG tablet TAKE ONE TABLET DAILY. 90 tablet 3  . B Complex-C (SUPER B COMPLEX PO) Take 1 tablet by mouth every morning.     . Calcium Carb-Cholecalciferol (CALCIUM 600/VITAMIN D3) 600-800 MG-UNIT TABS Take 1 capsule by mouth 2 (two) times daily.    . Diphenhydramine-PSE-APAP (ALLERGY/SINUS HEADACHE PO) Take 1 tablet by mouth as needed. For headaches    . Glucosamine-Chondroitin (GLUCOSAMINE CHONDR COMPLEX PO) Take 1 tablet by mouth 2 (two) times daily.     Marland Kitchen ipratropium (ATROVENT) 0.03 % nasal spray Place 2 sprays into the nose every 12 (twelve) hours.    Marland Kitchen loperamide (IMODIUM) 2 MG capsule Take 2 mg by mouth 4 (four) times daily as needed. For diarrhea    . LUTEIN PO Take 45 mg by mouth daily.     . Multiple Vitamins-Minerals (MULTIVITAMINS THER. W/MINERALS) TABS Take  1 tablet by mouth every morning.     Marland Kitchen omeprazole (PRILOSEC) 20 MG capsule Take 20 mg by mouth 2 (two) times daily before a meal.     . TURMERIC PO Take by mouth.     No current facility-administered medications for this visit.    Past Medical History  Diagnosis Date  . Uterine cancer 1998  . Chronic back pain   . GERD (gastroesophageal reflux disease) 01/21/2006    EGD Dr Oneida Alar mild chronic gastritis, (NO h pylori) otherwise normal  . Sinus congestion   . Headache(784.0)   . Breast cancer   . Thumb tendonitis September 2014    left  . Breast cancer   . Wears partial dentures     Past Surgical History  Procedure Laterality Date  . Cholecystectomy  11/1999  . S/p hysterectomy  1998    uterine ca  . Temperal/mandibular  1974  . Esophagogastroduodenoscopy  01/21/06    mild antral erythema bx h-pylori/normal esophagus without evidence of mass or Barrett's/normal pylorus and duodenum  . Cataracts    . Colonoscopy  05/13/2011    Procedure: COLONOSCOPY;  Surgeon: Dorothyann Peng, MD;  [facility];  Service: Endoscopy;  Laterality: N/A;  8:30  . Tonsillectomy  1964  . Abscess drainage  03-04-05    insect bite  . Cataract extraction w/phaco  10/06/2011    Procedure: CATARACT EXTRACTION PHACO AND INTRAOCULAR LENS PLACEMENT (IOC);  Surgeon: Williams Che, MD;  Location: [facility];  Service: Ophthalmology;  Laterality: Left;  CDE:  5.76  . Abdominal hysterectomy    . Partial mastectomy with needle localization and axillary sentinel lymph node bx Left 08/18/2012    Procedure: PARTIAL MASTECTOMY WITH NEEDLE LOCALIZATION AND AXILLARY SENTINEL LYMPH NODE BX;  Surgeon: Jamesetta So, MD;  Location: [facility];  Service: General;  Laterality: Left;  Need Frozen Section/Sentinel Node Bx @ 8:00am/Needle Loc @ 9:00am  . Trigger finger release Right 05/04/2014    Procedure: RELEASE TRIGGER FINGER/A-1 PULLEY RIGHT RING FINGER;  Surgeon: Daryll Brod, MD;  Location: [place];   Service: Orthopedics;  Laterality: Right;    History   Social History  . Marital Status: Widowed    Spouse Name: N/A  . Number of Children: 1  . Years of Education: N/A   Occupational History  . retired; Facilities manager asst    Social History Main Topics  . Smoking status: Never Smoker   . Smokeless tobacco: Never Used  . Alcohol Use: No  . Drug Use: No  . Sexual Activity: Not Currently    Birth Control/ Protection: Surgical   Other Topics Concern  . Not on file   Social History Narrative   1 adopted son-grown   Lives alone     Filed Vitals:   10/27/14 0932  BP: 132/66  Pulse: 65  Height: 5\' 3"  (1.6 m)  Weight: 188 lb 9.6 oz (85.548 kg)  SpO2: 97%    PHYSICAL EXAM General: NAD HEENT: Normal. Neck: No JVD, no thyromegaly. Lungs: Clear to auscultation bilaterally with normal respiratory effort. CV: Nondisplaced PMI.  Regular rate and rhythm, normal S1/S2, no S3/S4, no murmur. No pretibial or periankle edema.  No carotid bruit.  Normal pedal pulses.  Abdomen: Soft, nontender, no hepatosplenomegaly, no distention.  Neurologic: Alert and oriented x 3.  Psych: Normal affect. Skin: Normal. Musculoskeletal: Normal range of motion, no gross deformities. Extremities: No clubbing or cyanosis.   ECG: Most recent ECG reviewed.      ASSESSMENT AND PLAN: 1. Dyspnea on exertion and fatigue: This has been going on for over one year. Her ECG shows a nonspecific, mild ST segment abnormality. Given her limited exercise tolerance, I am still concerned about occult ischemic heart disease as a potential etiology. Given unremarkable PFT's and continued symptoms, I will proceed with coronary angiography with both right and left heart catheterization.   Dispo: f/u after cath.  Kate Sable, M.D., F.A.C.C.

## 2014-10-31 NOTE — Progress Notes (Signed)
Site area: rt arterial and venous sheaths Site Prior to Removal:  Level 0 Pressure Applied For:  20 minutes Manual:   yes Patient Status During Pull:  stable Post Pull Site:  Level  0 Post Pull Instructions Given:  yes Post Pull Pulses Present: yes Dressing Applied:  tegaderm Bedrest begins @  1130 Comments:  none

## 2014-10-31 NOTE — Interval H&P Note (Signed)
Cath Lab Visit (complete for each Cath Lab visit)  Clinical Evaluation Leading to the Procedure:   ACS: No.  Non-ACS:    Anginal Classification: CCS III  Anti-ischemic medical therapy: No Therapy  Non-Invasive Test Results: Low-risk stress test findings: cardiac mortality <1%/year  Prior CABG: No previous CABG      History and Physical Interval Note:  10/31/2014 9:47 AM  Kayla Thomas  has presented today for surgery, with the diagnosis of * No pre-op diagnosis entered *  The various methods of treatment have been discussed with the patient and family. After consideration of risks, benefits and other options for treatment, the patient has consented to  Procedure(s): Right/Left Heart Cath and Coronary Angiography (N/A) as a surgical intervention .  The patient's history has been reviewed, patient examined, no change in status, stable for surgery.  I have reviewed the patient's chart and labs.  Questions were answered to the patient's satisfaction.     KELLY,THOMAS A

## 2014-11-01 MED FILL — Heparin Sodium (Porcine) 2 Unit/ML in Sodium Chloride 0.9%: INTRAMUSCULAR | Qty: 1000 | Status: AC

## 2014-11-01 MED FILL — Lidocaine HCl Local Preservative Free (PF) Inj 1%: INTRAMUSCULAR | Qty: 30 | Status: AC

## 2014-11-09 ENCOUNTER — Ambulatory Visit (INDEPENDENT_AMBULATORY_CARE_PROVIDER_SITE_OTHER): Payer: Medicare Other | Admitting: Adult Health

## 2014-11-09 ENCOUNTER — Encounter: Payer: Self-pay | Admitting: Adult Health

## 2014-11-09 ENCOUNTER — Other Ambulatory Visit: Payer: Self-pay | Admitting: Obstetrics and Gynecology

## 2014-11-09 VITALS — BP 122/72 | HR 65 | Ht 63.0 in | Wt 187.0 lb

## 2014-11-09 DIAGNOSIS — Z853 Personal history of malignant neoplasm of breast: Secondary | ICD-10-CM

## 2014-11-09 DIAGNOSIS — Z79899 Other long term (current) drug therapy: Secondary | ICD-10-CM | POA: Diagnosis not present

## 2014-11-09 NOTE — Progress Notes (Deleted)
Name: Kayla Thomas    DOB: September 10, 1940  Age: 74 y.o.  MR#: [id number]       PCP:  Asencion Noble, MD      Insurance: Payor: MEDICARE / Plan: MEDICARE PART A AND B / Product Type: *No Product type* /   CC:    Chief Complaint  Patient presents with  . Chest Pain  . Shortness of Breath    VS Filed Vitals:   11/09/14 1455  BP: 122/72  Pulse: 65  Height: 5\' 3"  (1.6 m)  Weight: 187 lb (84.823 kg)  SpO2: 98%    Weights Current Weight  11/09/14 187 lb (84.823 kg)  10/31/14 188 lb (85.276 kg)  10/27/14 188 lb 9.6 oz (85.548 kg)    Blood Pressure  BP Readings from Last 3 Encounters:  11/09/14 122/72  10/31/14 125/60  10/27/14 132/66     Admit date:  (Not on file) Last encounter with RMR:  Visit date not found   Allergy Aspirin; Iron; Sulfa antibiotics; and Ancef  Current Outpatient Prescriptions  Medication Sig Dispense Refill  . anastrozole (ARIMIDEX) 1 MG tablet TAKE ONE TABLET DAILY. 90 tablet 3  . B Complex-C (SUPER B COMPLEX PO) Take 1 tablet by mouth every morning.     . Calcium Carb-Cholecalciferol (CALCIUM 600/VITAMIN D3) 600-800 MG-UNIT TABS Take 1 capsule by mouth 2 (two) times daily.    . Diphenhydramine-PE-APAP 25-5-325 MG TABS Take 1 tablet by mouth at bedtime.    . Glucosamine-Chondroitin (GLUCOSAMINE CHONDR COMPLEX PO) Take 1 tablet by mouth 2 (two) times daily.     Marland Kitchen ipratropium (ATROVENT) 0.03 % nasal spray Place 2 sprays into the nose every 12 (twelve) hours.    Marland Kitchen loperamide (IMODIUM) 2 MG capsule Take 2 mg by mouth 4 (four) times daily as needed. For diarrhea    . LUTEIN PO Take 45 mg by mouth daily.     . Multiple Vitamins-Minerals (MULTIVITAMINS THER. W/MINERALS) TABS Take 1 tablet by mouth every morning.     Marland Kitchen omeprazole (PRILOSEC) 20 MG capsule Take 20 mg by mouth 2 (two) times daily before a meal.     . TURMERIC PO Take 1 tablet by mouth daily.      No current facility-administered medications for this visit.    Discontinued Meds:   There are no  discontinued medications.  Patient Active Problem List   Diagnosis Date Noted  . Abnormal ECG   . DOE (dyspnea on exertion) 04/12/2014  . Annual physical exam 09/08/2013  . Screen for colon cancer 04/22/2011  . MIGRAINE HEADACHE 01/23/2009  . GERD 01/23/2009  . GASTRITIS 01/23/2009  . LOOSE STOOLS 01/23/2009  . ALLERGY 01/23/2009  . UTERINE CANCER, HX OF 01/23/2009    LABS    Component Value Date/Time   NA 140 10/31/2014 0809   NA 140 10/30/2014 0931   NA 138 10/09/2014 0946   K 4.2 10/31/2014 0809   K 4.6 10/30/2014 0931   K 5.0 10/09/2014 0946   CL 104 10/31/2014 0809   CL 102 10/30/2014 0931   CL 103 10/09/2014 0946   CO2 24 10/31/2014 0809   CO2 24 10/30/2014 0931   CO2 28 10/09/2014 0946   GLUCOSE 108* 10/31/2014 0809   GLUCOSE 122* 10/30/2014 0931   GLUCOSE 90 10/09/2014 0946   BUN 19 10/31/2014 0809   BUN 16 10/30/2014 0931   BUN 13 10/09/2014 0946   CREATININE 0.87 10/31/2014 0809   CREATININE 0.81 10/30/2014 0931   CREATININE 0.72 10/09/2014  0946   CREATININE 0.82 05/31/2014 0954   CREATININE 0.76 11/28/2013 0927   CALCIUM 8.9 10/31/2014 0809   CALCIUM 9.3 10/30/2014 0931   CALCIUM 9.4 10/09/2014 0946   GFRNONAA >60 10/31/2014 0809   GFRNONAA 69* 05/31/2014 0954   GFRNONAA 82* 11/28/2013 0927   GFRAA >60 10/31/2014 0809   GFRAA 80* 05/31/2014 0954   GFRAA >90 11/28/2013 0927   CMP     Component Value Date/Time   NA 140 10/31/2014 0809   K 4.2 10/31/2014 0809   CL 104 10/31/2014 0809   CO2 24 10/31/2014 0809   GLUCOSE 108* 10/31/2014 0809   BUN 19 10/31/2014 0809   CREATININE 0.87 10/31/2014 0809   CREATININE 0.81 10/30/2014 0931   CALCIUM 8.9 10/31/2014 0809   PROT 6.6 05/31/2014 0954   ALBUMIN 3.8 05/31/2014 0954   AST 20 05/31/2014 0954   ALT 17 05/31/2014 0954   ALKPHOS 123* 05/31/2014 0954   BILITOT 0.5 05/31/2014 0954   GFRNONAA >60 10/31/2014 0809   GFRAA >60 10/31/2014 0809       Component Value Date/Time   WBC 6.3 10/30/2014  0931   WBC 5.4 10/09/2014 0946   WBC 5.1 05/31/2014 0954   HGB 13.8 10/30/2014 0931   HGB 13.9 10/09/2014 0946   HGB 13.7 05/31/2014 0954   HCT 41.9 10/30/2014 0931   HCT 41.5 10/09/2014 0946   HCT 40.4 05/31/2014 0954   MCV 95.4 10/30/2014 0931   MCV 94.7 10/09/2014 0946   MCV 94.4 05/31/2014 0954    Lipid Panel  No results found for: CHOL, TRIG, HDL, CHOLHDL, VLDL, LDLCALC, LDLDIRECT  ABG    Component Value Date/Time   PHART 7.356 10/31/2014 1026   PCO2ART 41.6 10/31/2014 1026   PO2ART 126.0* 10/31/2014 1026   HCO3 23.3 10/31/2014 1026   TCO2 25 10/31/2014 1026   ACIDBASEDEF 2.0 10/31/2014 1026   O2SAT 99.0 10/31/2014 1026     No results found for: TSH BNP (last 3 results) No results for input(s): BNP in the last 8760 hours.  ProBNP (last 3 results) No results for input(s): PROBNP in the last 8760 hours.  Cardiac Panel (last 3 results) No results for input(s): CKTOTAL, CKMB, TROPONINI, RELINDX in the last 72 hours.  Iron/TIBC/Ferritin/ %Sat No results found for: IRON, TIBC, FERRITIN, IRONPCTSAT   EKG Orders placed or performed in visit on 10/06/14  . EKG 12-Lead     Prior Assessment and Plan Problem List as of 11/09/2014      Cardiovascular and Mediastinum   MIGRAINE HEADACHE     Digestive   GERD   Last Assessment & Plan 04/22/2011 Office Visit Written 04/22/2011 11:24 AM by Andria Meuse, NP    Doing well on omeprazole 20 mg daily. Refill for 90 with 3 refills for mail-order printed.      GASTRITIS     Other   DOE (dyspnea on exertion)   LOOSE STOOLS   ALLERGY   UTERINE CANCER, HX OF   Screen for colon cancer   Last Assessment & Plan 04/22/2011 Office Visit Written 04/22/2011 11:25 AM by Andria Meuse, NP    Average risk screening colonoscopy to be set up with Dr. Oneida Alar. I have discussed risks & benefits which include, but are not limited to, bleeding, infection, perforation & drug reaction.  The patient agrees with this plan & written consent  will be obtained.         Annual physical exam   Abnormal ECG  Imaging: No results found.

## 2014-11-09 NOTE — Progress Notes (Signed)
Cardiology Office Note   Date:  11/09/2014   ID:  Kayla Thomas, DOB 03-09-1941, MRN [id number]  PCP:  Asencion Noble, MD  Cardiologist:  Woodroe Chen, NP   Chief Complaint  Patient presents with  . Chest Pain  . Shortness of Breath      History of Present Illness: Kayla Thomas is a 74 y.o. female who presents for assessment and management of dyspnea.  The patient is status post cardiac catheterization conducted by Dr. Claiborne Billings on 10/30/2004 to evaluate for cardiac etiology of persistent dyspnea:  Normal LV function with an ejection fraction of 50-55% without focal segmental wall motion abnormalities. Normal coronary arteries. Normal to upper normal right heart pressures.  Exercise Cardiolite stress test in April of 2015 demonstrated normal myocardial perfusion.  The patient is here to discuss test results and continued management  She comes today with continued complaints of DOE. We have ruled out cardiac etiology of this. She has had a normal PFT. She is concerned her cancer has come back.  Past Medical History  Diagnosis Date  . Uterine cancer 1998  . Chronic back pain   . GERD (gastroesophageal reflux disease) 01/21/2006    EGD Dr Oneida Alar mild chronic gastritis, (NO h pylori) otherwise normal  . Sinus congestion   . Headache(784.0)   . Breast cancer   . Thumb tendonitis September 2014    left  . Breast cancer   . Wears partial dentures     Past Surgical History  Procedure Laterality Date  . Cholecystectomy  11/1999  . S/p hysterectomy  1998    uterine ca  . Temperal/mandibular  1974  . Esophagogastroduodenoscopy  01/21/06    mild antral erythema bx h-pylori/normal esophagus without evidence of mass or Barrett's/normal pylorus and duodenum  . Cataracts    . Colonoscopy  05/13/2011    Procedure: COLONOSCOPY;  Surgeon: Dorothyann Peng, MD;  [facility];  Service: Endoscopy;  Laterality: N/A;  8:30  . Tonsillectomy  1964  . Abscess  drainage  03-04-05    insect bite  . Cataract extraction w/phaco  10/06/2011    Procedure: CATARACT EXTRACTION PHACO AND INTRAOCULAR LENS PLACEMENT (IOC);  Surgeon: Williams Che, MD;  Location: [facility];  Service: Ophthalmology;  Laterality: Left;  CDE:  5.76  . Abdominal hysterectomy    . Partial mastectomy with needle localization and axillary sentinel lymph node bx Left 08/18/2012    Procedure: PARTIAL MASTECTOMY WITH NEEDLE LOCALIZATION AND AXILLARY SENTINEL LYMPH NODE BX;  Surgeon: Jamesetta So, MD;  Location: [facility];  Service: General;  Laterality: Left;  Need Frozen Section/Sentinel Node Bx @ 8:00am/Needle Loc @ 9:00am  . Trigger finger release Right 05/04/2014    Procedure: RELEASE TRIGGER FINGER/A-1 PULLEY RIGHT RING FINGER;  Surgeon: Daryll Brod, MD;  Location: [facility];  Service: Orthopedics;  Laterality: Right;  . Cardiac catheterization N/A 10/31/2014    Procedure: Right/Left Heart Cath and Coronary Angiography;  Surgeon: Troy Sine, MD;  Location: [facility];  Service: Cardiovascular;  Laterality: N/A;     Current Outpatient Prescriptions  Medication Sig Dispense Refill  . anastrozole (ARIMIDEX) 1 MG tablet TAKE ONE TABLET DAILY. 90 tablet 3  . B Complex-C (SUPER B COMPLEX PO) Take 1 tablet by mouth every morning.     . Calcium Carb-Cholecalciferol (CALCIUM 600/VITAMIN D3) 600-800 MG-UNIT TABS Take 1 capsule by mouth 2 (two) times daily.    . Diphenhydramine-PE-APAP 25-5-325 MG TABS Take 1  tablet by mouth at bedtime.    . Glucosamine-Chondroitin (GLUCOSAMINE CHONDR COMPLEX PO) Take 1 tablet by mouth 2 (two) times daily.     Marland Kitchen ipratropium (ATROVENT) 0.03 % nasal spray Place 2 sprays into the nose every 12 (twelve) hours.    Marland Kitchen loperamide (IMODIUM) 2 MG capsule Take 2 mg by mouth 4 (four) times daily as needed. For diarrhea    . LUTEIN PO Take 45 mg by mouth daily.     . Multiple Vitamins-Minerals (MULTIVITAMINS THER. W/MINERALS) TABS Take 1 tablet by  mouth every morning.     Marland Kitchen omeprazole (PRILOSEC) 20 MG capsule Take 20 mg by mouth 2 (two) times daily before a meal.     . TURMERIC PO Take 1 tablet by mouth daily.      No current facility-administered medications for this visit.    Allergies:   Aspirin; Iron; Sulfa antibiotics; and Ancef    Social History:  The patient  reports that she has never smoked. She has never used smokeless tobacco. She reports that she does not drink alcohol or use illicit drugs.   Family History:  The patient's family history includes Alcohol abuse in her father; COPD in her mother; Diabetes in her brother; Hypertension in her mother; Liver cancer in her father.    ROS: .   All other systems are reviewed and negative.Unless otherwise mentioned in H&P above.   PHYSICAL EXAM: VS:  BP 122/72 mmHg  Pulse 65  Ht 5\' 3"  (1.6 m)  Wt 187 lb (84.823 kg)  BMI 33.13 kg/m2  SpO2 98% , BMI Body mass index is 33.13 kg/(m^2). GEN: Well nourished, well developed, in no acute distress HEENT: normal Neck: no JVD, carotid bruits, or masses Cardiac: RRR; no murmurs, rubs, or gallops,no edema  Respiratory:  clear to auscultation bilaterally, normal work of breathing GI: soft, nontender, nondistended, + BS MS: no deformity or atrophy Skin: warm and dry, no rash Neuro:  Strength and sensation are intact Psych: euthymic mood, full affect  Recent Labs: 05/31/2014: ALT 17 10/30/2014: Hemoglobin 13.8; Platelets 215 10/31/2014: BUN 19; Creatinine 0.87; Potassium 4.2; Sodium 140    Lipid Panel No results found for: CHOL, TRIG, HDL, CHOLHDL, VLDL, LDLCALC, LDLDIRECT    Wt Readings from Last 3 Encounters:  11/09/14 187 lb (84.823 kg)  10/31/14 188 lb (85.276 kg)  10/27/14 188 lb 9.6 oz (85.548 kg)      Other studies Reviewed: Additional studies/ records that were reviewed today include: Last labs prior to cath Review of the above records demonstrates: All WNL    ASSESSMENT AND PLAN:  1.  Chronic DOE: Would  consider a sleep study if clinically warranted. Will check TSH. No further cardiac testing or recommendations.   2   Hx of Breast CA: She is due to have appt with oncology in June. She is advised to keep that appt.   Current medicines are reviewed at length with the patient today.    Labs/ tests ordered today include: TSH No orders of the defined types were placed in this encounter.     Disposition:   FU with PRN Signed, Jory Sims, NP  11/09/2014 3:30 PM    [facility] [address] [place], [place] [address] Phone: [phone]; Fax: [phone]

## 2014-11-09 NOTE — Patient Instructions (Addendum)
Your physician recommends that you schedule a follow-up appointment in: As needed  Your physician recommends that you continue on your current medications as directed. Please refer to the Current Medication list given to you today.  Your physician recommends that you return for lab work in: Today   Thank you for choosing [facility]!

## 2014-11-10 LAB — TSH: TSH: 1.799 u[IU]/mL (ref 0.350–4.500)

## 2014-11-28 ENCOUNTER — Ambulatory Visit (HOSPITAL_COMMUNITY)
Admission: RE | Admit: 2014-11-28 | Discharge: 2014-11-28 | Disposition: A | Discharge status: Home / Self Care | Payer: Medicare Other | Source: Ambulatory Visit | Attending: Obstetrics and Gynecology | Admitting: Obstetrics and Gynecology

## 2014-11-28 DIAGNOSIS — Z853 Personal history of malignant neoplasm of breast: Secondary | ICD-10-CM | POA: Diagnosis not present

## 2014-11-28 DIAGNOSIS — Z9889 Other specified postprocedural states: Secondary | ICD-10-CM | POA: Diagnosis not present

## 2014-11-28 DIAGNOSIS — R928 Other abnormal and inconclusive findings on diagnostic imaging of breast: Secondary | ICD-10-CM | POA: Diagnosis not present

## 2014-11-30 ENCOUNTER — Encounter (HOSPITAL_COMMUNITY): Payer: Self-pay | Admitting: Hematology & Oncology

## 2014-11-30 ENCOUNTER — Encounter (HOSPITAL_BASED_OUTPATIENT_CLINIC_OR_DEPARTMENT_OTHER): Payer: Medicare Other | Admitting: Hematology & Oncology

## 2014-11-30 ENCOUNTER — Encounter (HOSPITAL_COMMUNITY): Payer: Medicare Other | Attending: Hematology & Oncology

## 2014-11-30 ENCOUNTER — Ambulatory Visit (HOSPITAL_COMMUNITY): Payer: Medicare Other | Admitting: Hematology & Oncology

## 2014-11-30 VITALS — BP 129/66 | HR 58 | Temp 97.9°F | Resp 18 | Wt 188.9 lb

## 2014-11-30 DIAGNOSIS — Z79811 Long term (current) use of aromatase inhibitors: Secondary | ICD-10-CM | POA: Diagnosis not present

## 2014-11-30 DIAGNOSIS — C50919 Malignant neoplasm of unspecified site of unspecified female breast: Secondary | ICD-10-CM

## 2014-11-30 DIAGNOSIS — Z8542 Personal history of malignant neoplasm of other parts of uterus: Secondary | ICD-10-CM | POA: Diagnosis not present

## 2014-11-30 DIAGNOSIS — N951 Menopausal and female climacteric states: Secondary | ICD-10-CM

## 2014-11-30 LAB — COMPREHENSIVE METABOLIC PANEL
ALBUMIN: 4.3 g/dL (ref 3.5–5.0)
ALK PHOS: 120 U/L (ref 38–126)
ALT: 29 U/L (ref 14–54)
AST: 22 U/L (ref 15–41)
Anion gap: 9 (ref 5–15)
BUN: 17 mg/dL (ref 6–20)
CO2: 26 mmol/L (ref 22–32)
CREATININE: 0.75 mg/dL (ref 0.44–1.00)
Calcium: 9.2 mg/dL (ref 8.9–10.3)
Chloride: 104 mmol/L (ref 101–111)
GFR calc Af Amer: >60 mL/min (ref >60)
GFR calc non Af Amer: >60 mL/min (ref >60)
Glucose, Bld: 90 mg/dL (ref 65–99)
POTASSIUM: 4.3 mmol/L (ref 3.5–5.1)
SODIUM: 139 mmol/L (ref 135–145)
TOTAL PROTEIN: 6.7 g/dL (ref 6.5–8.1)
Total Bilirubin: 0.5 mg/dL (ref 0.3–1.2)

## 2014-11-30 LAB — CBC WITH DIFFERENTIAL/PLATELET
Basophils Absolute: 0 10*3/uL (ref 0.0–0.1)
Basophils Relative: 0 % (ref 0–1)
EOS ABS: 0.1 10*3/uL (ref 0.0–0.7)
Eosinophils Relative: 2 % (ref 0–5)
HCT: 39.7 % (ref 36.0–46.0)
Hemoglobin: 13.7 g/dL (ref 12.0–15.0)
LYMPHS ABS: 1.6 10*3/uL (ref 0.7–4.0)
Lymphocytes Relative: 22 % (ref 12–46)
MCH: 32.5 pg (ref 26.0–34.0)
MCHC: 34.5 g/dL (ref 30.0–36.0)
MCV: 94.1 fL (ref 78.0–100.0)
Monocytes Absolute: 0.6 10*3/uL (ref 0.1–1.0)
Monocytes Relative: 8 % (ref 3–12)
NEUTROS PCT: 68 % (ref 43–77)
Neutro Abs: 5 10*3/uL (ref 1.7–7.7)
PLATELETS: 208 10*3/uL (ref 150–400)
RBC: 4.22 MIL/uL (ref 3.87–5.11)
RDW: 12 % (ref 11.5–15.5)
WBC: 7.3 10*3/uL (ref 4.0–10.5)

## 2014-11-30 NOTE — Progress Notes (Signed)
[name] NOTE  Patient Care Team: Asencion Noble, MD as PCP - General (Internal Medicine) Danie Binder, MD (Gastroenterology) Herminio Commons, MD as Attending Physician (Cardiology)  CHIEF COMPLAINTS/PURPOSE OF CONSULTATION:  Stage I left breast cancer, s/p Lumpectomy, radiotherapy and arimidex Arimidex started on 12/21/2012 History of uterine carcinoma  HISTORY OF PRESENTING ILLNESS:  Kayla Thomas 74 y.o. female is here because of Stage I Left Breast Cancer. She is on arimidex.   She is here alone today and complains of having fatigue while walking or doing anything active.  She also has daily hot flashes. She goes to the [facility] 3 times a week, gardens, and maintains her yard frequently. She notes that it is getting very difficult to do these things because of joint pain and fatigue. She insists her symptoms have all started with the arimidex and she feels frustrated by her QOL.   Last Mammogram, Tuesday, 6/7 MEDICAL HISTORY:  Past Medical History  Diagnosis Date  . Uterine cancer 1998  . Chronic back pain   . GERD (gastroesophageal reflux disease) 01/21/2006    EGD Dr Oneida Alar mild chronic gastritis, (NO h pylori) otherwise normal  . Sinus congestion   . Headache(784.0)   . Breast cancer   . Thumb tendonitis September 2014    left  . Breast cancer   . Wears partial dentures   . Gastric ulcer     History    SURGICAL HISTORY: Past Surgical History  Procedure Laterality Date  . Cholecystectomy  11/1999  . S/p hysterectomy  1998    uterine ca  . Temperal/mandibular  1974  . Esophagogastroduodenoscopy  01/21/06    mild antral erythema bx h-pylori/normal esophagus without evidence of mass or Barrett's/normal pylorus and duodenum  . Cataracts    . Colonoscopy  05/13/2011    Procedure: COLONOSCOPY;  Surgeon: Dorothyann Peng, MD;  [facility];  Service: Endoscopy;  Laterality: N/A;  8:30  . Tonsillectomy  1964    . Abscess drainage  03-04-05    insect bite  . Cataract extraction w/phaco  10/06/2011    Procedure: CATARACT EXTRACTION PHACO AND INTRAOCULAR LENS PLACEMENT (IOC);  Surgeon: Williams Che, MD;  [facility];  Service: Ophthalmology;  Laterality: Left;  CDE:  5.76  . Abdominal hysterectomy    . Partial mastectomy with needle localization and axillary sentinel lymph node bx Left 08/18/2012    Procedure: PARTIAL MASTECTOMY WITH NEEDLE LOCALIZATION AND AXILLARY SENTINEL LYMPH NODE BX;  Surgeon: Jamesetta So, MD;  [facility];  Service: General;  Laterality: Left;  Need Frozen Section/Sentinel Node Bx @ 8:00am/Needle Loc @ 9:00am  . Trigger finger release Right 05/04/2014    Procedure: RELEASE TRIGGER FINGER/A-1 PULLEY RIGHT RING FINGER;  Surgeon: Daryll Brod, MD;  Location: [facility];  Service: Orthopedics;  Laterality: Right;  . Cardiac catheterization N/A 10/31/2014    Procedure: Right/Left Heart Cath and Coronary Angiography;  Surgeon: Troy Sine, MD;  Location: [facility];  Service: Cardiovascular;  Laterality: N/A;    SOCIAL HISTORY: History   Social History  . Marital Status: Widowed    Spouse Name: N/A  . Number of Children: 1  . Years of Education: N/A   Occupational History  . retired; Facilities manager asst    Social History Main Topics  . Smoking status: Never Smoker   . Smokeless tobacco: Never Used  . Alcohol Use: No  .  Drug Use: No  . Sexual Activity: Not Currently    Birth Control/ Protection: Surgical   Other Topics Concern  . Not on file   Social History Narrative   1 adopted son-grown   Lives alone    FAMILY HISTORY: Family History  Problem Relation Age of Onset  . Liver cancer Father   . Alcohol abuse Father   . Hypertension Mother   . COPD Mother   . Diabetes Brother    indicated that her mother is deceased. She indicated that her father is deceased. She indicated that both of her brothers are alive.   ALLERGIES:  is  allergic to aspirin; iron; sulfa antibiotics; and ancef.  MEDICATIONS:  Current Outpatient Prescriptions  Medication Sig Dispense Refill  . anastrozole (ARIMIDEX) 1 MG tablet TAKE ONE TABLET DAILY. 90 tablet 3  . B Complex-C (SUPER B COMPLEX PO) Take 1 tablet by mouth every morning.     . Calcium Carb-Cholecalciferol (CALCIUM 600/VITAMIN D3) 600-800 MG-UNIT TABS Take 1 capsule by mouth 2 (two) times daily.    . Diphenhydramine-PE-APAP 25-5-325 MG TABS Take 1 tablet by mouth at bedtime.    . Glucosamine-Chondroitin (GLUCOSAMINE CHONDR COMPLEX PO) Take 1 tablet by mouth 2 (two) times daily.     Marland Kitchen ipratropium (ATROVENT) 0.03 % nasal spray Place 2 sprays into the nose every 12 (twelve) hours.    Marland Kitchen loperamide (IMODIUM) 2 MG capsule Take 2 mg by mouth 4 (four) times daily as needed. For diarrhea    . LUTEIN PO Take 45 mg by mouth daily.     . Multiple Vitamins-Minerals (MULTIVITAMINS THER. W/MINERALS) TABS Take 1 tablet by mouth every morning.     Marland Kitchen omeprazole (PRILOSEC) 20 MG capsule Take 20 mg by mouth 2 (two) times daily before a meal.     . TURMERIC PO Take 1 tablet by mouth daily.      No current facility-administered medications for this visit.    Review of Systems  Constitutional: Positive for malaise/fatigue.       Daily hot flashes   Musculoskeletal: Positive for joint pain.       Arthritis, "aches"   14 point ROS was done and is otherwise as detailed above or in HPI   PHYSICAL EXAMINATION: ECOG PERFORMANCE STATUS: 1 - Symptomatic but completely ambulatory  Filed Vitals:   11/30/14 1249  BP: 129/66  Pulse: 58  Temp: 97.9 F (36.6 C)  Resp: 18   Filed Weights   11/30/14 1249  Weight: 188 lb 14.4 oz (85.684 kg)     Physical Exam  Constitutional: She is oriented to person, place, and time and well-developed, well-nourished, and in no distress.  HENT:  Head: Normocephalic and atraumatic.  Nose: Nose normal.  Mouth/Throat: Oropharynx is clear and moist. No  oropharyngeal exudate.  Eyes: Conjunctivae and EOM are normal. Pupils are equal, round, and reactive to light. Right eye exhibits no discharge. Left eye exhibits no discharge. No scleral icterus.  Neck: Normal range of motion. Neck supple. No tracheal deviation present. No thyromegaly present.  Cardiovascular: Normal rate, regular rhythm and normal heart sounds.  Exam reveals no gallop and no friction rub.   No murmur heard. Pulmonary/Chest: Effort normal and breath sounds normal. She has no wheezes. She has no rales.    Abdominal: Soft. Bowel sounds are normal. She exhibits no distension and no mass. There is no tenderness. There is no rebound and no guarding.  Musculoskeletal: Normal range of motion. She exhibits no edema.  Lymphadenopathy:    She has no cervical adenopathy.  Neurological: She is alert and oriented to person, place, and time. She has normal reflexes. No cranial nerve deficit. Gait normal. Coordination normal.  Skin: Skin is warm and dry. No rash noted.  Large surgical incision site in mid-back.   Psychiatric: Mood, memory, affect and judgment normal.  Nursing note and vitals reviewed.   LABORATORY DATA:  I have reviewed the data as listed Lab Results  Component Value Date   WBC 7.3 11/30/2014   HGB 13.7 11/30/2014   HCT 39.7 11/30/2014   MCV 94.1 11/30/2014   PLT 208 11/30/2014     ASSESSMENT & PLAN:  Stage I left breast cancer, s/p Lumpectomy, radiotherapy and arimidex Arimidex started on 12/21/2012 History of uterine carcinoma Arimidex induced joint pain Worsening Fatigue  We spent time today discussing risks and benefits of endocrine therapy. She agrees that the benefits outweigh any risk associated with them. At this point however she feels unable to tolerate Arimidex and would like to take a break from the medication to see if her excessive fatigue and joint pain improve. I advised her this is certainly not unreasonable and we will regroup within 6 weeks to  reassess her symptoms. If improved we can consider switching her to Aromasin. We discussed other alternatives including tamoxifen. She has a history of uterine cancer but has undergone a hysterectomy.  She is due for a DEXA and we have ordered this.  We will see her back in 6-8 weeks at which time we will regroup and make additional treatment recommendations. She was also referred to the [facility] program.  Orders Placed This Encounter  Procedures  . DG Bone Density    Standing Status: Future     Number of Occurrences:      Standing Expiration Date: 11/30/2015    Order Specific Question:  Reason for Exam (SYMPTOM  OR DIAGNOSIS REQUIRED)    Answer:  high risk medication    Order Specific Question:  Preferred imaging location?    Answer:  [facility]    All questions were answered. The patient knows to call the clinic with any problems, questions or concerns.   This document serves as a record of services personally performed by Ancil Linsey, MD. It was created on her behalf by Janace Hoard, a trained medical scribe. The creation of this record is based on the scribe's personal observations and the provider's statements to them. This document has been checked and approved by the attending provider.  I have reviewed the above documentation for accuracy and completeness, and I agree with the above.  This note was electronically signed.   Molli Hazard, MD

## 2014-11-30 NOTE — Patient Instructions (Signed)
[facility] at [facility] Discharge Instructions  RECOMMENDATIONS MADE BY THE CONSULTANT AND ANY TEST RESULTS WILL BE SENT TO YOUR REFERRING PHYSICIAN.  Exam and discussion by Dr. Whitney Muse. Stop the Anastrozole (arimidex) for now to see if your symptoms improve. Report any new lumps, bone pain, shortness of breath or other symptoms.  Follow-up in 6 weeks.  Thank you for choosing [facility] at [facility] to provide your oncology and hematology care.  To afford each patient quality time with our provider, please arrive at least 15 minutes before your scheduled appointment time.    You need to re-schedule your appointment should you arrive 10 or more minutes late.  We strive to give you quality time with our providers, and arriving late affects you and other patients whose appointments are after yours.  Also, if you no show three or more times for appointments you may be dismissed from the clinic at the providers discretion.     Again, thank you for choosing [facility].  Our hope is that these requests will decrease the amount of time that you wait before being seen by our physicians.       _____________________________________________________________  Should you have questions after your visit to [facility], please contact our office at [phone] between the hours of 8:30 a.m. and 4:30 p.m.  Voicemails left after 4:30 p.m. will not be returned until the following business day.  For prescription refill requests, have your pharmacy contact our office.

## 2014-12-01 LAB — CANCER ANTIGEN 27.29: CA 27.29: 23.5 U/mL (ref 0.0–38.6)

## 2014-12-12 ENCOUNTER — Ambulatory Visit (HOSPITAL_COMMUNITY)
Admission: RE | Admit: 2014-12-12 | Discharge: 2014-12-12 | Disposition: A | Discharge status: Home / Self Care | Payer: Medicare Other | Source: Ambulatory Visit | Attending: Hematology & Oncology | Admitting: Hematology & Oncology

## 2014-12-12 DIAGNOSIS — Z780 Asymptomatic menopausal state: Secondary | ICD-10-CM | POA: Diagnosis not present

## 2014-12-12 DIAGNOSIS — Z853 Personal history of malignant neoplasm of breast: Secondary | ICD-10-CM | POA: Insufficient documentation

## 2014-12-12 DIAGNOSIS — C50919 Malignant neoplasm of unspecified site of unspecified female breast: Secondary | ICD-10-CM

## 2015-01-11 ENCOUNTER — Ambulatory Visit (HOSPITAL_COMMUNITY): Payer: Medicare Other | Admitting: Hematology & Oncology

## 2015-01-16 ENCOUNTER — Encounter (HOSPITAL_COMMUNITY): Payer: Medicare Other | Attending: Hematology & Oncology | Admitting: Hematology & Oncology

## 2015-01-16 ENCOUNTER — Encounter (HOSPITAL_COMMUNITY): Payer: Self-pay | Admitting: Hematology & Oncology

## 2015-01-16 VITALS — BP 107/66 | HR 58 | Temp 98.2°F | Resp 20 | Wt 187.7 lb

## 2015-01-16 DIAGNOSIS — M2550 Pain in unspecified joint: Secondary | ICD-10-CM | POA: Diagnosis not present

## 2015-01-16 DIAGNOSIS — M545 Low back pain: Secondary | ICD-10-CM

## 2015-01-16 DIAGNOSIS — C50919 Malignant neoplasm of unspecified site of unspecified female breast: Secondary | ICD-10-CM | POA: Insufficient documentation

## 2015-01-16 DIAGNOSIS — Z8542 Personal history of malignant neoplasm of other parts of uterus: Secondary | ICD-10-CM | POA: Diagnosis not present

## 2015-01-16 DIAGNOSIS — C50912 Malignant neoplasm of unspecified site of left female breast: Secondary | ICD-10-CM

## 2015-01-16 DIAGNOSIS — C50412 Malignant neoplasm of upper-outer quadrant of left female breast: Secondary | ICD-10-CM | POA: Diagnosis not present

## 2015-01-16 DIAGNOSIS — R5383 Other fatigue: Secondary | ICD-10-CM

## 2015-01-16 MED ORDER — TAMOXIFEN CITRATE 20 MG PO TABS: 20.0000 mg | ORAL_TABLET | Freq: Every day | ORAL | Status: DC

## 2015-01-16 NOTE — Progress Notes (Signed)
[place] [facility] NOTE  Patient Care Team: Asencion Noble, MD as PCP - General (Internal Medicine) Danie Binder, MD (Gastroenterology) Herminio Commons, MD as Attending Physician (Cardiology)  CHIEF COMPLAINTS/PURPOSE OF CONSULTATION:  Stage I left breast cancer, s/p Lumpectomy, radiotherapy and arimidex Arimidex started on 12/21/2012 History of uterine carcinoma s/p hysterectomy DEXA 12/12/2014 with normal bone density  HISTORY OF PRESENTING ILLNESS:  Kayla Thomas 74 y.o. female is here because of Stage I Left Breast Cancer. She had been on Arimidex but at her last visit remarked that she was unable to do her ADLs secondary to significant aching and fatigue. After discussion of risks and benefits she opted to take a break from her Arimidex therapy and is here today for follow-up.  The patient is here alone today and says that she is feeling better.  She does have complaints of fatigue and contribute this to lower back weakness.  This has improved from before.  She says that as far as joint pain, she no longer aches.  She can do chores such as vacuuming now that she couldn't do before.  Walking does exhaust her. She has had a hysterectomy.  The patient goes to the [facility] 3 times a week. She remarks that it is much easier to exercise. She does not wish to restart Arimidex if possible.   MEDICAL HISTORY:  Past Medical History  Diagnosis Date  . Uterine cancer 1998  . Chronic back pain   . GERD (gastroesophageal reflux disease) 01/21/2006    EGD Dr Oneida Alar mild chronic gastritis, (NO h pylori) otherwise normal  . Sinus congestion   . Headache(784.0)   . Breast cancer   . Thumb tendonitis September 2014    left  . Breast cancer   . Wears partial dentures   . Gastric ulcer     History    SURGICAL HISTORY: Past Surgical History  Procedure Laterality Date  . Cholecystectomy  11/1999  . S/p hysterectomy  1998    uterine ca  .  Temperal/mandibular  1974  . Esophagogastroduodenoscopy  01/21/06    mild antral erythema bx h-pylori/normal esophagus without evidence of mass or Barrett's/normal pylorus and duodenum  . Cataracts    . Colonoscopy  05/13/2011    Procedure: COLONOSCOPY;  Surgeon: Dorothyann Peng, MD;  [facility]: [facility];  Service: Endoscopy;  Laterality: N/A;  8:30  . Tonsillectomy  1964  . Abscess drainage  03-04-05    insect bite  . Cataract extraction w/phaco  10/06/2011    Procedure: CATARACT EXTRACTION PHACO AND INTRAOCULAR LENS PLACEMENT (IOC);  Surgeon: Williams Che, MD;  Location: [facility];  Service: Ophthalmology;  Laterality: Left;  CDE:  5.76  . Abdominal hysterectomy    . Partial mastectomy with needle localization and axillary sentinel lymph node bx Left 08/18/2012    Procedure: PARTIAL MASTECTOMY WITH NEEDLE LOCALIZATION AND AXILLARY SENTINEL LYMPH NODE BX;  Surgeon: Jamesetta So, MD;  Location: [facility];  Service: General;  Laterality: Left;  Need Frozen Section/Sentinel Node Bx @ 8:00am/Needle Loc @ 9:00am  . Trigger finger release Right 05/04/2014    Procedure: RELEASE TRIGGER FINGER/A-1 PULLEY RIGHT RING FINGER;  Surgeon: Daryll Brod, MD;  Location: [facility];  Service: Orthopedics;  Laterality: Right;  . Cardiac catheterization N/A 10/31/2014    Procedure: Right/Left Heart Cath and Coronary Angiography;  Surgeon: Troy Sine, MD;  Location: [facility];  Service:  Cardiovascular;  Laterality: N/A;    SOCIAL HISTORY: History   Social History  . Marital Status: Widowed    Spouse Name: N/A  . Number of Children: 1  . Years of Education: N/A   Occupational History  . retired; Facilities manager asst    Social History Main Topics  . Smoking status: Never Smoker   . Smokeless tobacco: Never Used  . Alcohol Use: No  . Drug Use: No  . Sexual Activity: Not Currently    Birth Control/ Protection: Surgical   Other Topics Concern  . Not on file   Social History Narrative     1 adopted son-grown   Lives alone    FAMILY HISTORY: Family History  Problem Relation Age of Onset  . Liver cancer Father   . Alcohol abuse Father   . Hypertension Mother   . COPD Mother   . Diabetes Brother    indicated that her mother is deceased. She indicated that her father is deceased. She indicated that both of her brothers are alive.   ALLERGIES:  is allergic to aspirin; iron; sulfa antibiotics; and ancef.  MEDICATIONS:  Current Outpatient Prescriptions  Medication Sig Dispense Refill  . B Complex-C (SUPER B COMPLEX PO) Take 1 tablet by mouth every morning.     . Calcium Carb-Cholecalciferol (CALCIUM 600/VITAMIN D3) 600-800 MG-UNIT TABS Take 1 capsule by mouth 2 (two) times daily.    . Diphenhydramine-PE-APAP 25-5-325 MG TABS Take 1 tablet by mouth at bedtime.    . Glucosamine-Chondroitin (GLUCOSAMINE CHONDR COMPLEX PO) Take 1 tablet by mouth 2 (two) times daily.     Marland Kitchen ipratropium (ATROVENT) 0.03 % nasal spray Place 2 sprays into the nose every 12 (twelve) hours.    Marland Kitchen loperamide (IMODIUM) 2 MG capsule Take 2 mg by mouth 4 (four) times daily as needed. For diarrhea    . LUTEIN PO Take 45 mg by mouth daily.     . Multiple Vitamins-Minerals (MULTIVITAMINS THER. W/MINERALS) TABS Take 1 tablet by mouth every morning.     Marland Kitchen omeprazole (PRILOSEC) 20 MG capsule Take 20 mg by mouth 2 (two) times daily before a meal.     . tamoxifen (NOLVADEX) 20 MG tablet Take 1 tablet (20 mg total) by mouth daily. 30 tablet 3  . TURMERIC PO Take 1 tablet by mouth daily.      No current facility-administered medications for this visit.    Review of Systems  Constitutional: Positive for malaise, but improved       Daily hot flashes   Musculoskeletal: Positive for joint pain, chronic arthritis, but general joint aching improved since discontinuation of arimidex      Arthritis, "aches"   14 point ROS was done and is otherwise as detailed above or in HPI   PHYSICAL EXAMINATION: ECOG  PERFORMANCE STATUS: 1 - Symptomatic but completely ambulatory  Filed Vitals:   01/16/15 1113  BP: 107/66  Pulse: 58  Temp: 98.2 F (36.8 C)  Resp: 20   Filed Weights   01/16/15 1113  Weight: 187 lb 11.2 oz (85.14 kg)    Physical Exam  Constitutional: She is oriented to person, place, and time and well-developed, well-nourished, and in no distress.  HENT:  Head: Normocephalic and atraumatic.  Nose: Nose normal.  Mouth/Throat: Oropharynx is clear and moist. No oropharyngeal exudate.  Eyes: Conjunctivae and EOM are normal. Pupils are equal, round, and reactive to light. Right eye exhibits no discharge. Left eye exhibits no discharge. No scleral icterus.  Neck:  Normal range of motion. Neck supple. No tracheal deviation present. No thyromegaly present.  Cardiovascular: Normal rate, regular rhythm and normal heart sounds.  Exam reveals no gallop and no friction rub.   No murmur heard. Pulmonary/Chest: Effort normal and breath sounds normal. She has no wheezes. She has no rales.  Abdominal: Soft. Bowel sounds are normal. She exhibits no distension and no mass. There is no tenderness. There is no rebound and no guarding.  Musculoskeletal: Normal range of motion. She exhibits no edema. significant OA of the hands Lymphadenopathy:    She has no cervical adenopathy.  Neurological: She is alert and oriented to person, place, and time. She has normal reflexes. No cranial nerve deficit. Gait normal. Coordination normal.  Skin: Skin is warm and dry. No rash noted.  Large surgical incision site in mid-back.   Psychiatric: Mood, memory, affect and judgment normal.  Nursing note and vitals reviewed.   LABORATORY DATA:  I have reviewed the data as listed Lab Results  Component Value Date   WBC 7.3 11/30/2014   HGB 13.7 11/30/2014   HCT 39.7 11/30/2014   MCV 94.1 11/30/2014   PLT 208 11/30/2014    RADIOLOGY:   CLINICAL DATA: History of left lumpectomy 2014.  EXAM: DIGITAL  DIAGNOSTIC BILATERAL MAMMOGRAM WITH 3D TOMOSYNTHESIS AND CAD  COMPARISON: 07/28/2012 and earlier  ACR Breast Density Category b: There are scattered areas of fibroglandular density.  FINDINGS: Postoperative changes are seen in the left breast. No suspicious mass, distortion, or microcalcifications are identified to suggest presence of malignancy.  Mammographic images were processed with CAD.  IMPRESSION: No mammographic evidence for malignancy.  RECOMMENDATION: Diagnostic mammogram is suggested in 1 year. (Code:DM-B-01Y)  I have discussed the findings and recommendations with the patient. Results were also provided in writing at the conclusion of the visit. If applicable, a reminder letter will be sent to the patient regarding the next appointment.  BI-RADS CATEGORY 2: Benign.   Electronically Signed  By: Nolon Nations M.D.  On: 11/28/2014 10:24      ASSESSMENT & PLAN:  Stage I left breast cancer, s/p Lumpectomy, radiotherapy and arimidex Arimidex started on 12/21/2012 History of uterine carcinoma Arimidex induced joint pain Worsening Fatigue   We discussed alternative therapy for her breast cancer. She has a history of endometrial cancer but underwent a hysterectomy. She could try tamoxifen and we discussed this in detail. I reviewed the risks of tamoxifen including blood clot risk and also the uterine effects. Given that she has had a hysterectomy the uterine side effect obviously do not apply. She would like to try tamoxifen. I have called in a prescription.  I will see her back in 6 weeks to assess tolerance. For her low back pain and ongoing fatigue (it is improving significantly) we will refer her to the [facility] program.   Orders Placed This Encounter  Procedures  . Ambulatory referral to Physical Therapy    Referral Priority:  Routine    Referral Type:  Physical Medicine    Referral Reason:  Specialty Services Required    Requested Specialty:   Physical Therapy    Number of Visits Requested:  1  . Ambulatory referral to Occupational Therapy    Referral Priority:  Routine    Referral Type:  Occupational Therapy    Referral Reason:  Specialty Services Required    Requested Specialty:  Occupational Therapy    Number of Visits Requested:  1    All questions were answered. The patient knows to  call the clinic with any problems, questions or concerns.   This document serves as a record of services personally performed by Ancil Linsey, MD. It was created on her behalf by Janace Hoard, a trained medical scribe. The creation of this record is based on the scribe's personal observations and the provider's statements to them. This document has been checked and approved by the attending provider.  I have reviewed the above documentation for accuracy and completeness, and I agree with the above.  This note was electronically signed.   Patrici Ranks, MD

## 2015-01-16 NOTE — Patient Instructions (Signed)
[place] at [facility] Discharge Instructions  RECOMMENDATIONS MADE BY THE CONSULTANT AND ANY TEST RESULTS WILL BE SENT TO YOUR REFERRING PHYSICIAN.  Will see you in 6weeks. Dr. Whitney Muse referred you to our [facility] program. Someone will call you to set up an appointment  Thank you for choosing [place] at [facility] to provide your oncology and hematology care.  To afford each patient quality time with our provider, please arrive at least 15 minutes before your scheduled appointment time.    You need to re-schedule your appointment should you arrive 10 or more minutes late.  We strive to give you quality time with our providers, and arriving late affects you and other patients whose appointments are after yours.  Also, if you no show three or more times for appointments you may be dismissed from the clinic at the providers discretion.     Again, thank you for choosing [facility].  Our hope is that these requests will decrease the amount of time that you wait before being seen by our physicians.       _____________________________________________________________  Should you have questions after your visit to [facility], please contact our office at [phone] between the hours of 8:30 a.m. and 4:30 p.m.  Voicemails left after 4:30 p.m. will not be returned until the following business day.  For prescription refill requests, have your pharmacy contact our office.

## 2015-02-06 ENCOUNTER — Ambulatory Visit (HOSPITAL_COMMUNITY): Payer: Medicare Other

## 2015-02-06 ENCOUNTER — Ambulatory Visit (HOSPITAL_COMMUNITY): Payer: Medicare Other | Attending: Hematology & Oncology | Admitting: Physical Therapy

## 2015-02-06 ENCOUNTER — Encounter (HOSPITAL_COMMUNITY): Payer: Self-pay

## 2015-02-06 DIAGNOSIS — R6889 Other general symptoms and signs: Secondary | ICD-10-CM | POA: Diagnosis not present

## 2015-02-06 DIAGNOSIS — M6289 Other specified disorders of muscle: Secondary | ICD-10-CM | POA: Insufficient documentation

## 2015-02-06 DIAGNOSIS — Z853 Personal history of malignant neoplasm of breast: Secondary | ICD-10-CM

## 2015-02-06 DIAGNOSIS — R29898 Other symptoms and signs involving the musculoskeletal system: Secondary | ICD-10-CM | POA: Insufficient documentation

## 2015-02-06 DIAGNOSIS — R262 Difficulty in walking, not elsewhere classified: Secondary | ICD-10-CM

## 2015-02-06 DIAGNOSIS — M25651 Stiffness of right hip, not elsewhere classified: Secondary | ICD-10-CM

## 2015-02-06 DIAGNOSIS — M25652 Stiffness of left hip, not elsewhere classified: Secondary | ICD-10-CM | POA: Insufficient documentation

## 2015-02-06 DIAGNOSIS — R198 Other specified symptoms and signs involving the digestive system and abdomen: Secondary | ICD-10-CM | POA: Insufficient documentation

## 2015-02-06 DIAGNOSIS — M6281 Muscle weakness (generalized): Secondary | ICD-10-CM

## 2015-02-06 NOTE — Therapy (Signed)
[place], [name], [address] Phone: [phone]   Fax:  [phone]  Patient Details  Name: Kayla Thomas MRN: [id number] Date of Birth: 1940-12-09 Referring Provider:  Patrici Ranks, MD  Encounter Date: 02/06/2015 Occupational therapy screen completed this date. Patient is a 74 y/o female s/p Stage 1 left breast cancer with hx of lumpectomy and radiotherapy. Pt also has a hx of uterine carcinoma resulting in a hysterectomy. Pt states that she was initially having difficulty with ADL tasks at home when she was taking Arimidex medication. Pt states that her fatigue would be so severe she was unable to vacuum without becoming exhausted. She has since been changed to tamoxifen which has not caused her to become fatigue as before. Pt reports no more difficulties completing ADL tasks at home. Assessed BUE AROM, strength, grip and pinch strength which are all WFL. Pt reports that majority of her issues relating to her back pain and leg weakness when walking. As patient is receiving PT services for the listed deficits no further OT services needed at this time. Thank-you for the referral.   Ailene Ravel, OTR/L,CBIS  [phone]  02/06/2015, 10:39 AM  [place] [address] [place], [name], [address] Phone: [phone]   Fax:  [phone]

## 2015-02-06 NOTE — Therapy (Signed)
[place], [name], [address] Phone: [phone]   Fax:  [phone]  Physical Therapy Evaluation  Patient Details  Name: Kayla Thomas MRN: [id number] Date of Birth: 05-28-1941 Referring Provider:  Patrici Ranks, MD  Encounter Date: 02/06/2015      PT End of Session - 02/06/15 0954    Visit Number 1   Number of Visits 16   Date for PT Re-Evaluation 03/06/15   Authorization Type Medicare    Authorization Time Period 02/06/15 to 04/08/15   Authorization - Visit Number 1   Authorization - Number of Visits 10   PT Start Time 0908   PT Stop Time 0952   PT Time Calculation (min) 44 min   Activity Tolerance Patient tolerated treatment well;Patient limited by fatigue   Behavior During Therapy [facility] for tasks assessed/performed      Past Medical History  Diagnosis Date  . Uterine cancer 1998  . Chronic back pain   . GERD (gastroesophageal reflux disease) 01/21/2006    EGD Dr Oneida Alar mild chronic gastritis, (NO h pylori) otherwise normal  . Sinus congestion   . Headache(784.0)   . Breast cancer   . Thumb tendonitis September 2014    left  . Breast cancer   . Wears partial dentures   . Gastric ulcer     History    Past Surgical History  Procedure Laterality Date  . Cholecystectomy  11/1999  . S/p hysterectomy  1998    uterine ca  . Temperal/mandibular  1974  . Esophagogastroduodenoscopy  01/21/06    mild antral erythema bx h-pylori/normal esophagus without evidence of mass or Barrett's/normal pylorus and duodenum  . Cataracts    . Colonoscopy  05/13/2011    Procedure: COLONOSCOPY;  Surgeon: Dorothyann Peng, MD;  [facility];  Service: Endoscopy;  Laterality: N/A;  8:30  . Tonsillectomy  1964  . Abscess drainage  03-04-05    insect bite  . Cataract extraction w/phaco  10/06/2011    Procedure: CATARACT EXTRACTION PHACO AND INTRAOCULAR LENS PLACEMENT (IOC);  Surgeon: Williams Che, MD;   Location: [facility];  Service: Ophthalmology;  Laterality: Left;  CDE:  5.76  . Abdominal hysterectomy    . Partial mastectomy with needle localization and axillary sentinel lymph node bx Left 08/18/2012    Procedure: PARTIAL MASTECTOMY WITH NEEDLE LOCALIZATION AND AXILLARY SENTINEL LYMPH NODE BX;  Surgeon: Jamesetta So, MD;  Location: [facility];  Service: General;  Laterality: Left;  Need Frozen Section/Sentinel Node Bx @ 8:00am/Needle Loc @ 9:00am  . Trigger finger release Right 05/04/2014    Procedure: RELEASE TRIGGER FINGER/A-1 PULLEY RIGHT RING FINGER;  Surgeon: Daryll Brod, MD;  Location: [place];  Service: Orthopedics;  Laterality: Right;  . Cardiac catheterization N/A 10/31/2014    Procedure: Right/Left Heart Cath and Coronary Angiography;  Surgeon: Troy Sine, MD;  Location: [facility];  Service: Cardiovascular;  Laterality: N/A;    There were no vitals filed for this visit.  Visit Diagnosis:  History of breast cancer - Plan: PT plan of care cert/re-cert  Weakness of both lower extremities - Plan: PT plan of care cert/re-cert  Proximal muscle weakness - Plan: PT plan of care cert/re-cert  Abdominal weakness - Plan: PT plan of care cert/re-cert  Decreased functional activity tolerance - Plan: PT plan of care cert/re-cert  Difficulty walking - Plan: PT plan of care cert/re-cert  Hip joint stiffness, left -  Plan: PT plan of care cert/re-cert  Stiffness of hip joint, right - Plan: PT plan of care cert/re-cert      Subjective Assessment - 02/06/15 0911    Subjective Was very fatigued to the point that MD needed to change medication; since then she has been feeling better but still feels very tired in her legs and back, can't walk very far. Also has some pain and stiffness especially in her back and hips.    Pertinent History Patient has had low back surgery in the past-  four screws and a plate. History of uterine cancer. Was diagnosed with L breast cancer  in 2014, had been taking oral cancer medications as well as radiation. No longer on radiation, just doing oral medicines.  Still doing water exercise at [facility] 3x/week, a little tired after but mostly OK afterwards.    Patient Stated Goals be able to walk without pain    Currently in Pain? No/denies            [facility] PT Assessment - 02/06/15 0001    Assessment   Medical Diagnosis L breast cancer    Onset Date/Surgical Date --  2014   Next MD Visit September with Dr. Whitney Muse    Precautions   Precautions None   Restrictions   Weight Bearing Restrictions No   Balance Screen   Has the patient fallen in the past 6 months No   Has the patient had a decrease in activity level because of a fear of falling?  Yes  reports she has to rest more often    Is the patient reluctant to leave their home because of a fear of falling?  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    Observation/Other Assessments   Observations No LLD noted today; TUG 9.5, 8.9, 8.5   Focus on Therapeutic Outcomes (FOTO)  FACIT 116; Fatigue VAS 4/7/4; Pain VAS 1/6/3; Distress VAS 1/2/1   Posture/Postural Control   Posture Comments increased ER R hip, forward head with B IR shoulders, reduced spinal curves, apparent varus knees    AROM   Right Hip External Rotation  38   Right Hip Internal Rotation  25   Left Hip External Rotation  --  [facility]    Left Hip Internal Rotation  30   Lumbar Flexion 88   Lumbar Extension 18   Lumbar - Right Side Bend 27   Lumbar - Left Side Bend 25   Strength   Overall Strength Comments lower abs approx 3-/5, upper abs approx 4/5    Right Hip Flexion 4/5   Right Hip Extension 3-/5  measured in sidelying, patient does not like prone position   Right Hip ABduction 2+/5   Left Hip Flexion 3+/5   Left Hip Extension 2/5  measured  in sidelying, patient does not like prone position   Left Hip ABduction 2/5   Right Knee Flexion 3+/5   Right Knee Extension 4/5   Left Knee Flexion 4/5   Left Knee Extension 4+/5   Right Ankle Dorsiflexion 4+/5   Left Ankle Dorsiflexion 5/5   Ambulation/Gait   Gait Comments increased ER of R hip, flexed at hips, proximal muscle weakness, reduced rotation of pelvis and trunk, possible LLD    6 minute walk test results    Endurance additional comments 6MWT 113ft, 1.45m/s   High Level  Balance   High Level Balance Comments SLS R 9 seconds, SLS L 10 seconds                            PT Education - 02/06/15 0954    Education provided Yes   Education Details prognosis, plan of care moving forward, HEP; gave patient pedometer and STAR booklet    Person(s) Educated Patient   Methods Explanation;Handout   Comprehension Verbalized understanding;Need further instruction          PT Short Term Goals - 02/06/15 0959    PT SHORT TERM GOAL #1   Title Patient to be able to verbally report the importance of maintaining good posture through all functional tasks, and will be able to maintain correct posture during all functional activities    Time 4   Period Weeks   Status New   PT SHORT TERM GOAL #2   Title Patient to demonstrate bilateral hip ER of at least 50 degrees, bilateral hip IR of at least 35 degrees in order to enhance overall mobility and mechanics    Time 4   Period Weeks   Status New   PT SHORT TERM GOAL #3   Title Patient to report that she is experiencing no more than 2/10 pain in her low back with all functional weightbearing tasks of at least 60 minutes in duration    Time 4   Period Weeks   Status New   PT SHORT TERM GOAL #4   Title Patient will be independent in correctly and consistently performing appropriate HEP, to be updated PRN    Time 4   Period Weeks   Status New           PT Long Term Goals - 02/06/15 1002    PT LONG TERM GOAL #1    Title Patient will demonstrate 5/5 strength in bilateral lower extremities, at least 4/5 strength in proximal musculature, and at least 4/5 strength in general core    Time 8   Period Weeks   Status New   PT LONG TERM GOAL #2   Title Patient will demonstrate the ablity to ambulate at least 1463ft during 6 minute walk test with no rest breaks, at a gait speed of at least 1.88m/s   Time 8   Period Weeks   Status New   PT LONG TERM GOAL #3   Title Patient will demonstrate the ability to maintain SLS for at least 45 seconds each lower extremity with no HHA    Time 8   Period Weeks   Status New   PT LONG TERM GOAL #4   Title Patient will demonstrate an increase in overall FACIT-F score to at least 130 to indicate improved quality of life    Time [place] - 02/06/15 0955    Clinical Impression Statement Patient presents after diagnosis and treatment of L breast cancer with postural and gait impairment, bilateral lower extremity/proximal muscle/core weakness, bilateral hip stiffness, low back pain, reduced functional activity tolerance, and impaired balance. The patient reports she is still taking oral medications for her cancer and had recently chagned medications as her previous one had caused extreme fatigue, has been feeling better since but still limited. Patient also reports that she goes to water exercise classes at the [facility] 3x/week, and was  encouraged to continue doing so. At this time patient will benefit from skilled PT services in order to address her functional impairments and assist her in reaching an optimal level of function.    Pt will benefit from skilled therapeutic intervention in order to improve on the following deficits Abnormal gait;Decreased endurance;Hypomobility;Decreased activity tolerance;Decreased strength;Pain;Decreased balance;Decreased mobility;Difficulty walking;Decreased coordination;Decreased safety  awareness;Postural dysfunction   Rehab Potential Good   PT Frequency 2x / week   PT Duration 8 weeks   PT Treatment/Interventions ADLs/Self Care Home Management;Gait training;Stair training;Functional mobility training;Therapeutic activities;Therapeutic exercise;Balance training;Neuromuscular re-education;Patient/family education;Manual techniques;Energy conservation   PT Next Visit Plan review HEP and goals; functional stretching, functional strength, balance, gait, functional activity tolerance    PT Home Exercise Plan given    Consulted and Agree with Plan of Care Patient          G-Codes - 02/11/2015 1014    Functional Assessment Tool Used Based on skilled clinical assessment of strength, ROM, gait, posture, balance, functional activity tolerance    Functional Limitation Mobility: Walking and moving around   Mobility: Walking and Moving Around Current Status (V6979) At least 40 percent but less than 60 percent impaired, limited or restricted   Mobility: Walking and Moving Around Goal Status [phone]) At least 20 percent but less than 40 percent impaired, limited or restricted       Problem List Patient Active Problem List   Diagnosis Date Noted  . Abnormal ECG   . DOE (dyspnea on exertion) 04/12/2014  . Annual physical exam 09/08/2013  . Screen for colon cancer 04/22/2011  . MIGRAINE HEADACHE 01/23/2009  . GERD 01/23/2009  . GASTRITIS 01/23/2009  . LOOSE STOOLS 01/23/2009  . ALLERGY 01/23/2009  . UTERINE CANCER, HX OF 01/23/2009    Deniece Ree PT, DPT [facility] [address] [place], [name], [address] Phone: [phone]   Fax:  [phone]

## 2015-02-06 NOTE — Patient Instructions (Signed)
    BRIDGING  While lying on your back, tighten your lower abdominals, squeeze your buttocks and then raise your buttocks off the floor/bed as creating a "Bridge" with your body.  Repeat 10 times, 2-3 times a day.      HIP ABDUCTION - STANDING   While standing, raise your leg out to the side. Keep your knee straight and maintain your toes pointed forward the entire time.    Use your arms for support if needed for balance and safety.   Repeat 10 times each leg, 2-3 times per day.    HIP EXTENSION - STANDING  While standing, move your leg back as shown.  Use your arms for support if needed for balance and safety.   Repeat 10 times each leg, 2-3 times per day.   ABDOMINAL SQUEEZES  Squeeze your tummy muscles so that your stomach becomes tight; it should feel like you are sucking your belly button to your spine. Hold for a count of 3 seconds and release. Perform 100 repetitions a day, however you can break it down however you would like- IE 5 sets of 20, 10 sets of 10, etc.

## 2015-02-08 ENCOUNTER — Ambulatory Visit (HOSPITAL_COMMUNITY): Payer: Medicare Other | Admitting: Physical Therapy

## 2015-02-08 DIAGNOSIS — M25651 Stiffness of right hip, not elsewhere classified: Secondary | ICD-10-CM

## 2015-02-08 DIAGNOSIS — R262 Difficulty in walking, not elsewhere classified: Secondary | ICD-10-CM

## 2015-02-08 DIAGNOSIS — R198 Other specified symptoms and signs involving the digestive system and abdomen: Secondary | ICD-10-CM

## 2015-02-08 DIAGNOSIS — R6889 Other general symptoms and signs: Secondary | ICD-10-CM | POA: Diagnosis not present

## 2015-02-08 DIAGNOSIS — R29898 Other symptoms and signs involving the musculoskeletal system: Secondary | ICD-10-CM | POA: Diagnosis not present

## 2015-02-08 DIAGNOSIS — Z853 Personal history of malignant neoplasm of breast: Secondary | ICD-10-CM

## 2015-02-08 DIAGNOSIS — M6289 Other specified disorders of muscle: Secondary | ICD-10-CM | POA: Diagnosis not present

## 2015-02-08 DIAGNOSIS — M25652 Stiffness of left hip, not elsewhere classified: Secondary | ICD-10-CM

## 2015-02-08 DIAGNOSIS — M6281 Muscle weakness (generalized): Secondary | ICD-10-CM

## 2015-02-08 NOTE — Patient Instructions (Addendum)
Functional Quadriceps: Chair Squat   Keeping feet flat on floor, shoulder width apart, squat as low as is comfortable. Use support as necessary. At kitchen counter Repeat _10___ times per set. Do ____ sets per session. Do ___2_ sessions per day. 1 http://orth.exer.us/736   Copyright  VHI. All rights reserved.  Heel Raise: Bilateral (Standing)  At the kitchen counter. Rise on balls of feet. Repeat _10___ times per set. Do __1__ sets per session. Do _2___ sessions per day.  http://orth.exer.us/38   Copyright  VHI. All rights reserved.  Balance: Unilateral   Attempt to balance on left leg, eyes open. Hold _5-15___ seconds. Repeat 3____ times per set. Do __1__ sets per session. Do ___2_ sessions per day. Perform exercise with eyes closed.  http://orth.exer.us/28   Copyright  VHI. All rights reserved.  Step-Down / Step-Up   Stand on stair step or __4__ inch stool. Slowly bend left leg, lowering other foot to floor. Return by straightening front leg. Repeat __10__ times per set. Do _1___ sets per session. Do _2___ sessions per day.  http://orth.exer.us/684   Copyright  VHI. All rights reserved.  Stretching: Hamstring (Supine)   Supporting right thigh behind knee, slowly straighten knee until stretch is felt in back of thigh. Hold __30__ seconds. Repeat ___1_ times per set. Do __1__ sets per session. Do _3___ sessions per day.  http://orth.exer.us/656   Copyright  VHI. All rights reserved.  Piriformis Stretch, Sitting   Sit, one ankle on opposite knee, same-side hand on crossed knee. Push down on knee, keeping spine straight. Lean torso forward, with flat back, until tension is felt in hamstrings and gluteals of crossed-leg side. Hold _30__ seconds.  Repeat ___ times per session. Do __2_ sessions per day. 3 Copyright  VHI. All rights reserved.

## 2015-02-08 NOTE — Therapy (Signed)
[place], [name], [address] Phone: [phone]   Fax:  [phone]  Physical Therapy Treatment  Patient Details  Name: Kayla Thomas MRN: [id number] Date of Birth: [birth year] Referring Provider:  Patrici Ranks, MD  Encounter Date: 02/08/2015      PT End of Session - 02/08/15 0930    Visit Number 2   Number of Visits 16   Date for PT Re-Evaluation 03/06/15   Authorization Type Medicare    Authorization Time Period 02/06/15 to 04/08/15   Authorization - Visit Number 2   Authorization - Number of Visits 10   PT Start Time 0347   PT Stop Time 0943   PT Time Calculation (min) 49 min   Activity Tolerance Patient tolerated treatment well      Past Medical History  Diagnosis Date  . Uterine cancer 1998  . Chronic back pain   . GERD (gastroesophageal reflux disease) 01/21/2006    EGD Dr Oneida Alar mild chronic gastritis, (NO h pylori) otherwise normal  . Sinus congestion   . Headache(784.0)   . Breast cancer   . Thumb tendonitis September 2014    left  . Breast cancer   . Wears partial dentures   . Gastric ulcer     History    Past Surgical History  Procedure Laterality Date  . Cholecystectomy  11/1999  . S/p hysterectomy  1998    uterine ca  . Temperal/mandibular  1974  . Esophagogastroduodenoscopy  01/21/06    mild antral erythema bx h-pylori/normal esophagus without evidence of mass or Barrett's/normal pylorus and duodenum  . Cataracts    . Colonoscopy  05/13/2011    Procedure: COLONOSCOPY;  Surgeon: Dorothyann Peng, MD;  [facility];  Service: Endoscopy;  Laterality: N/A;  8:30  . Tonsillectomy  1964  . Abscess drainage  03-04-05    insect bite  . Cataract extraction w/phaco  10/06/2011    Procedure: CATARACT EXTRACTION PHACO AND INTRAOCULAR LENS PLACEMENT (IOC);  Surgeon: Williams Che, MD;  [facility]: [facility];  Service: Ophthalmology;  Laterality: Left;  CDE:  5.76  .  Abdominal hysterectomy    . Partial mastectomy with needle localization and axillary sentinel lymph node bx Left 08/18/2012    Procedure: PARTIAL MASTECTOMY WITH NEEDLE LOCALIZATION AND AXILLARY SENTINEL LYMPH NODE BX;  Surgeon: Jamesetta So, MD;  Location: [facility];  Service: General;  Laterality: Left;  Need Frozen Section/Sentinel Node Bx @ 8:00am/Needle Loc @ 9:00am  . Trigger finger release Right 05/04/2014    Procedure: RELEASE TRIGGER FINGER/A-1 PULLEY RIGHT RING FINGER;  Surgeon: Daryll Brod, MD;  Location: [place];  Service: Orthopedics;  Laterality: Right;  . Cardiac catheterization N/A 10/31/2014    Procedure: Right/Left Heart Cath and Coronary Angiography;  Surgeon: Troy Sine, MD;  Location: [place] [facility];  Service: Cardiovascular;  Laterality: N/A;    There were no vitals filed for this visit.  Visit Diagnosis:  History of breast cancer  Weakness of both lower extremities  Proximal muscle weakness  Abdominal weakness  Decreased functional activity tolerance  Difficulty walking  Hip joint stiffness, left  Stiffness of hip joint, right      Subjective Assessment - 02/08/15 0854    Subjective Pt states she read through her book but is not sure what to do with it.  Pt going to the [employer] 3 times a day.     Pertinent History Patient has  had low back surgery in the past-  four screws and a plate. History of uterine cancer. Was diagnosed with L breast cancer in 2014, had been taking oral cancer medications as well as radiation. No longer on radiation, just doing oral medicines.  Still doing water exercise at [facility] 3x/week, a little tired after but mostly OK afterwards.    Currently in Pain? Yes   Pain Score 2    Pain Location Hip   Pain Orientation Right   Pain Descriptors / Indicators Tightness   Pain Type Chronic pain                         [facility] Adult PT Treatment/Exercise - 02/08/15 0001    Exercises   Exercises Knee/Hip    Knee/Hip Exercises: Stretches   Piriformis Stretch Both;2 reps;30 seconds   Gastroc Stretch 3 reps;30 seconds   Knee/Hip Exercises: Aerobic   Nustep hills 3 Level 2 x 8:00   Knee/Hip Exercises: Standing   Heel Raises Both;10 reps   Forward Step Up Both;10 reps;Hand Hold: 2;Step Height: 4"   Functional Squat 10 reps   Rocker Board 2 minutes   Rocker Board Limitations Rt/LT and anterior/post.    SLS x3 B    Knee/Hip Exercises: Seated   Other Seated Knee/Hip Exercises sit to stand x 5 B                 PT Education - 02/08/15 0930    Education provided Yes   Education Details review star booklet; new HEP    Person(s) Educated Patient   Methods Explanation;Handout   Comprehension Verbalized understanding;Returned demonstration          PT Short Term Goals - 02/06/15 0959    PT SHORT TERM GOAL #1   Title Patient to be able to verbally report the importance of maintaining good posture through all functional tasks, and will be able to maintain correct posture during all functional activities    Time 4   Period Weeks   Status New   PT SHORT TERM GOAL #2   Title Patient to demonstrate bilateral hip ER of at least 50 degrees, bilateral hip IR of at least 35 degrees in order to enhance overall mobility and mechanics    Time 4   Period Weeks   Status New   PT SHORT TERM GOAL #3   Title Patient to report that she is experiencing no more than 2/10 pain in her low back with all functional weightbearing tasks of at least 60 minutes in duration    Time 4   Period Weeks   Status New   PT SHORT TERM GOAL #4   Title Patient will be independent in correctly and consistently performing appropriate HEP, to be updated PRN    Time 4   Period Weeks   Status New           PT Long Term Goals - 02/06/15 1002    PT LONG TERM GOAL #1   Title Patient will demonstrate 5/5 strength in bilateral lower extremities, at least 4/5 strength in proximal musculature, and at least 4/5 strength  in general core    Time 8   Period Weeks   Status New   PT LONG TERM GOAL #2   Title Patient will demonstrate the ablity to ambulate at least 1431ft during 6 minute walk test with no rest breaks, at a gait speed of at least 1.51m/s   Time  8   Period Weeks   Status New   PT LONG TERM GOAL #3   Title Patient will demonstrate the ability to maintain SLS for at least 45 seconds each lower extremity with no HHA    Time 8   Period Weeks   Status New   PT LONG TERM GOAL #4   Title Patient will demonstrate an increase in overall FACIT-F score to at least 130 to indicate improved quality of life    Time [place] - 02/08/15 0931    Clinical Impression Statement Pt unsure what she is suppose to do with Star booklet therefore therapist reviewed sections and reasoning behind each section.  Reviewed evaluatin and goals with pt.  Introduced stretching, strengthening and balance exercises with therapist facilitation for proper technique.  Noted SOB during exercies but pt denied needing to take a rest break.    Pt will benefit from skilled therapeutic intervention in order to improve on the following deficits Abnormal gait;Decreased endurance;Hypomobility;Decreased activity tolerance;Decreased strength;Pain;Decreased balance;Decreased mobility;Difficulty walking;Decreased coordination;Decreased safety awareness;Postural dysfunction   PT Treatment/Interventions ADLs/Self Care Home Management;Gait training;Stair training;Functional mobility training;Therapeutic activities;Therapeutic exercise;Balance training;Neuromuscular re-education;Patient/family education;Manual techniques;Energy conservation   PT Next Visit Plan Question on steps per day; continue with lateral step ups, tandem stance, forward and side lunges progress to warrior poses as able.         Problem List Patient Active Problem List   Diagnosis Date Noted  . Abnormal ECG   . DOE (dyspnea on  exertion) 04/12/2014  . Annual physical exam 09/08/2013  . Screen for colon cancer 04/22/2011  . MIGRAINE HEADACHE 01/23/2009  . GERD 01/23/2009  . GASTRITIS 01/23/2009  . LOOSE STOOLS 01/23/2009  . ALLERGY 01/23/2009  . UTERINE CANCER, HX OF 01/23/2009    Rayetta Humphrey, PT CLT [id number] 02/08/2015, 9:34 AM  [place], [name], [address] Phone: [phone]   Fax:  [phone]

## 2015-02-13 ENCOUNTER — Ambulatory Visit (HOSPITAL_COMMUNITY): Payer: Medicare Other | Admitting: Physical Therapy

## 2015-02-13 DIAGNOSIS — Z853 Personal history of malignant neoplasm of breast: Secondary | ICD-10-CM | POA: Diagnosis not present

## 2015-02-13 DIAGNOSIS — M6289 Other specified disorders of muscle: Secondary | ICD-10-CM | POA: Diagnosis not present

## 2015-02-13 DIAGNOSIS — R29898 Other symptoms and signs involving the musculoskeletal system: Secondary | ICD-10-CM

## 2015-02-13 DIAGNOSIS — R262 Difficulty in walking, not elsewhere classified: Secondary | ICD-10-CM | POA: Diagnosis not present

## 2015-02-13 DIAGNOSIS — R198 Other specified symptoms and signs involving the digestive system and abdomen: Secondary | ICD-10-CM | POA: Diagnosis not present

## 2015-02-13 DIAGNOSIS — R6889 Other general symptoms and signs: Secondary | ICD-10-CM | POA: Diagnosis not present

## 2015-02-13 DIAGNOSIS — M6281 Muscle weakness (generalized): Secondary | ICD-10-CM

## 2015-02-13 NOTE — Therapy (Signed)
[place], [name], [address] Phone: [phone]   Fax:  [phone]  Physical Therapy Treatment  Patient Details  Name: Kayla Thomas MRN: [id number] Date of Birth: Jun 03, 1941 Referring Provider:  Patrici Ranks, MD  Encounter Date: 02/13/2015      PT End of Session - 02/13/15 1010    Visit Number 3   Number of Visits 16   Date for PT Re-Evaluation 03/06/15   Authorization Type Medicare    Authorization Time Period 02/06/15 to 04/08/15   Authorization - Visit Number 3   Authorization - Number of Visits 10   PT Start Time 0930   PT Stop Time 1017   PT Time Calculation (min) 47 min   Activity Tolerance Patient tolerated treatment well   Behavior During Therapy [facility] for tasks assessed/performed      Past Medical History  Diagnosis Date  . Uterine cancer 1998  . Chronic back pain   . GERD (gastroesophageal reflux disease) 01/21/2006    EGD Dr Oneida Alar mild chronic gastritis, (NO h pylori) otherwise normal  . Sinus congestion   . Headache(784.0)   . Breast cancer   . Thumb tendonitis September 2014    left  . Breast cancer   . Wears partial dentures   . Gastric ulcer     History    Past Surgical History  Procedure Laterality Date  . Cholecystectomy  11/1999  . S/p hysterectomy  1998    uterine ca  . Temperal/mandibular  1974  . Esophagogastroduodenoscopy  01/21/06    mild antral erythema bx h-pylori/normal esophagus without evidence of mass or Barrett's/normal pylorus and duodenum  . Cataracts    . Colonoscopy  05/13/2011    Procedure: COLONOSCOPY;  Surgeon: Dorothyann Peng, MD;  [facility];  Service: Endoscopy;  Laterality: N/A;  8:30  . Tonsillectomy  1964  . Abscess drainage  03-04-05    insect bite  . Cataract extraction w/phaco  10/06/2011    Procedure: CATARACT EXTRACTION PHACO AND INTRAOCULAR LENS PLACEMENT (IOC);  Surgeon: Williams Che, MD;  [facility]: [facility];   Service: Ophthalmology;  Laterality: Left;  CDE:  5.76  . Abdominal hysterectomy    . Partial mastectomy with needle localization and axillary sentinel lymph node bx Left 08/18/2012    Procedure: PARTIAL MASTECTOMY WITH NEEDLE LOCALIZATION AND AXILLARY SENTINEL LYMPH NODE BX;  Surgeon: Jamesetta So, MD;  Location: [facility];  Service: General;  Laterality: Left;  Need Frozen Section/Sentinel Node Bx @ 8:00am/Needle Loc @ 9:00am  . Trigger finger release Right 05/04/2014    Procedure: RELEASE TRIGGER FINGER/A-1 PULLEY RIGHT RING FINGER;  Surgeon: Daryll Brod, MD;  Location: [place];  Service: Orthopedics;  Laterality: Right;  . Cardiac catheterization N/A 10/31/2014    Procedure: Right/Left Heart Cath and Coronary Angiography;  Surgeon: Troy Sine, MD;  Location: [facility];  Service: Cardiovascular;  Laterality: N/A;    There were no vitals filed for this visit.  Visit Diagnosis:  Weakness of both lower extremities  Proximal muscle weakness  Abdominal weakness  History of breast cancer  Decreased functional activity tolerance      Subjective Assessment - 02/13/15 0932    Subjective Pt reports that she does not have any pain today, has some stiffness in her hips when she frist stands up.    Currently in Pain? No/denies   Pain Score 0-No pain  [place] Adult PT Treatment/Exercise - 02/13/15 0001    Knee/Hip Exercises: Stretches   Active Hamstring Stretch 3 reps;30 seconds   Active Hamstring Stretch Limitations 12" step   Piriformis Stretch 3 reps;30 seconds   Gastroc Stretch 3 reps;30 seconds   Knee/Hip Exercises: Aerobic   Nustep hills 3 Level 3 x 10'   Knee/Hip Exercises: Standing   Heel Raises Both;15 reps   Lateral Step Up 10 reps;Step Height: 4"   Forward Step Up 15 reps;Step Height: 4"  attempted 6" step, pt reported increased hip pain on R   Rocker Board 2 minutes   Rocker Board Limitations Rt/LT and  anterior/post.    Other Standing Knee Exercises sidestepping x 2 RT   Knee/Hip Exercises: Seated   Sit to Sand 10 reps;2 sets  x10 with each foot forward   Knee/Hip Exercises: Supine   Bridges Limitations 15   Knee/Hip Exercises: Sidelying   Hip ABduction 10 reps;Both   Manual Therapy   Manual Therapy Joint mobilization   Manual therapy comments manual stretching of hip internal rotators to improve ER   Joint Mobilization LAD of R hip                  PT Short Term Goals - 02/06/15 0959    PT SHORT TERM GOAL #1   Title Patient to be able to verbally report the importance of maintaining good posture through all functional tasks, and will be able to maintain correct posture during all functional activities    Time 4   Period Weeks   Status New   PT SHORT TERM GOAL #2   Title Patient to demonstrate bilateral hip ER of at least 50 degrees, bilateral hip IR of at least 35 degrees in order to enhance overall mobility and mechanics    Time 4   Period Weeks   Status New   PT SHORT TERM GOAL #3   Title Patient to report that she is experiencing no more than 2/10 pain in her low back with all functional weightbearing tasks of at least 60 minutes in duration    Time 4   Period Weeks   Status New   PT SHORT TERM GOAL #4   Title Patient will be independent in correctly and consistently performing appropriate HEP, to be updated PRN    Time 4   Period Weeks   Status New           PT Long Term Goals - 02/06/15 1002    PT LONG TERM GOAL #1   Title Patient will demonstrate 5/5 strength in bilateral lower extremities, at least 4/5 strength in proximal musculature, and at least 4/5 strength in general core    Time 8   Period Weeks   Status New   PT LONG TERM GOAL #2   Title Patient will demonstrate the ablity to ambulate at least 1433ft during 6 minute walk test with no rest breaks, at a gait speed of at least 1.27m/s   Time 8   Period Weeks   Status New   PT LONG TERM GOAL  #3   Title Patient will demonstrate the ability to maintain SLS for at least 45 seconds each lower extremity with no HHA    Time 8   Period Weeks   Status New   PT LONG TERM GOAL #4   Title Patient will demonstrate an increase in overall FACIT-F score to at least 130 to indicate improved quality of life  Time 8   Period Weeks   Status New               Plan - 02/13/15 1010    Clinical Impression Statement Treatment session focused on functional stretching and strengthening. Pt was unable to complete step ups onto 6" box with RLE due to c/o increased pain, wasa ble to complete step ups on 4" box. Pt demonstrated SOB during treatment session, but denied needing rest break. Pt able to tolerate increase in time and resistance on Nustep without pain.         Problem List Patient Active Problem List   Diagnosis Date Noted  . Abnormal ECG   . DOE (dyspnea on exertion) 04/12/2014  . Annual physical exam 09/08/2013  . Screen for colon cancer 04/22/2011  . MIGRAINE HEADACHE 01/23/2009  . GERD 01/23/2009  . GASTRITIS 01/23/2009  . LOOSE STOOLS 01/23/2009  . ALLERGY 01/23/2009  . UTERINE CANCER, HX OF 01/23/2009    Hilma Favors, PT, DPT [phone] 02/13/2015, 10:14 AM  [place], [name], [address] Phone: [phone]   Fax:  [phone]

## 2015-02-15 ENCOUNTER — Ambulatory Visit (HOSPITAL_COMMUNITY): Payer: Medicare Other

## 2015-02-15 DIAGNOSIS — R29898 Other symptoms and signs involving the musculoskeletal system: Secondary | ICD-10-CM

## 2015-02-15 DIAGNOSIS — R6889 Other general symptoms and signs: Secondary | ICD-10-CM | POA: Diagnosis not present

## 2015-02-15 DIAGNOSIS — Z853 Personal history of malignant neoplasm of breast: Secondary | ICD-10-CM | POA: Diagnosis not present

## 2015-02-15 DIAGNOSIS — M25652 Stiffness of left hip, not elsewhere classified: Secondary | ICD-10-CM

## 2015-02-15 DIAGNOSIS — M6281 Muscle weakness (generalized): Secondary | ICD-10-CM

## 2015-02-15 DIAGNOSIS — M6289 Other specified disorders of muscle: Secondary | ICD-10-CM | POA: Diagnosis not present

## 2015-02-15 DIAGNOSIS — R198 Other specified symptoms and signs involving the digestive system and abdomen: Secondary | ICD-10-CM

## 2015-02-15 DIAGNOSIS — R262 Difficulty in walking, not elsewhere classified: Secondary | ICD-10-CM

## 2015-02-15 DIAGNOSIS — M25651 Stiffness of right hip, not elsewhere classified: Secondary | ICD-10-CM

## 2015-02-15 NOTE — Therapy (Signed)
[place], [name], [address] Phone: [phone]   Fax:  [phone]  Physical Therapy Treatment  Patient Details  Name: Kayla Thomas MRN: [id number] Date of Birth: [birth year] Referring Provider:  Patrici Ranks, MD  Encounter Date: 02/15/2015      PT End of Session - 02/15/15 1024    Visit Number 4   Number of Visits 16   Date for PT Re-Evaluation 03/06/15   Authorization Type Medicare    Authorization Time Period 02/06/15 to 04/08/15   Authorization - Visit Number 4   Authorization - Number of Visits 10   PT Start Time 1017   PT Stop Time 1104   PT Time Calculation (min) 47 min   Activity Tolerance Patient tolerated treatment well   Behavior During Therapy [facility] for tasks assessed/performed      Past Medical History  Diagnosis Date  . Uterine cancer 1998  . Chronic back pain   . GERD (gastroesophageal reflux disease) 01/21/2006    EGD Dr Oneida Alar mild chronic gastritis, (NO h pylori) otherwise normal  . Sinus congestion   . Headache(784.0)   . Breast cancer   . Thumb tendonitis September 2014    left  . Breast cancer   . Wears partial dentures   . Gastric ulcer     History    Past Surgical History  Procedure Laterality Date  . Cholecystectomy  11/1999  . S/p hysterectomy  1998    uterine ca  . Temperal/mandibular  1974  . Esophagogastroduodenoscopy  01/21/06    mild antral erythema bx h-pylori/normal esophagus without evidence of mass or Barrett's/normal pylorus and duodenum  . Cataracts    . Colonoscopy  05/13/2011    Procedure: COLONOSCOPY;  Surgeon: Dorothyann Peng, MD;  [facility];  Service: Endoscopy;  Laterality: N/A;  8:30  . Tonsillectomy  1964  . Abscess drainage  03-04-05    insect bite  . Cataract extraction w/phaco  10/06/2011    Procedure: CATARACT EXTRACTION PHACO AND INTRAOCULAR LENS PLACEMENT (IOC);  Surgeon: Williams Che, MD;  [facility];   Service: Ophthalmology;  Laterality: Left;  CDE:  5.76  . Abdominal hysterectomy    . Partial mastectomy with needle localization and axillary sentinel lymph node bx Left 08/18/2012    Procedure: PARTIAL MASTECTOMY WITH NEEDLE LOCALIZATION AND AXILLARY SENTINEL LYMPH NODE BX;  Surgeon: Jamesetta So, MD;  [facility];  Service: General;  Laterality: Left;  Need Frozen Section/Sentinel Node Bx @ 8:00am/Needle Loc @ 9:00am  . Trigger finger release Right 05/04/2014    Procedure: RELEASE TRIGGER FINGER/A-1 PULLEY RIGHT RING FINGER;  Surgeon: Daryll Brod, MD;  Location: [place];  Service: Orthopedics;  Laterality: Right;  . Cardiac catheterization N/A 10/31/2014    Procedure: Right/Left Heart Cath and Coronary Angiography;  Surgeon: Troy Sine, MD;  Location: [place] [facility];  Service: Cardiovascular;  Laterality: N/A;    There were no vitals filed for this visit.  Visit Diagnosis:  Weakness of both lower extremities  Proximal muscle weakness  Abdominal weakness  History of breast cancer  Decreased functional activity tolerance  Difficulty walking  Hip joint stiffness, left  Stiffness of hip joint, right      Subjective Assessment - 02/15/15 1013    Subjective Pt stated her Rt hip feels stiff after sitting for a few minutes, pain resolved following standing or walking for a little while  Currently in Pain? No/denies              [facility] Adult PT Treatment/Exercise - 02/15/15 0001    Exercises   Exercises Knee/Hip   Knee/Hip Exercises: Aerobic   Nustep hills 3 Level 3 x 10'   Knee/Hip Exercises: Standing   Heel Raises Both;15 reps   Heel Raises Limitations Toe raises   Hip ADduction --   Hip Abduction Both;10 reps   Lateral Step Up Both;15 reps;Step Height: 4";Hand Hold: 2   Forward Step Up Right;Step Height: 4";Left;Step Height: 6";15 reps   Functional Squat 10 reps   Functional Squat Limitations 3D hip excursion   Rocker Board 2 minutes    Rocker Board Limitations Rt/LT and anterior/post.    SLS Rt 43", LT 9" max of 3   Other Standing Knee Exercises sidestepping x 2 RT   Other Standing Knee Exercises tandem stance 3x 30"             PT Short Term Goals - 02/06/15 0959    PT SHORT TERM GOAL #1   Title Patient to be able to verbally report the importance of maintaining good posture through all functional tasks, and will be able to maintain correct posture during all functional activities    Time 4   Period Weeks   Status New   PT SHORT TERM GOAL #2   Title Patient to demonstrate bilateral hip ER of at least 50 degrees, bilateral hip IR of at least 35 degrees in order to enhance overall mobility and mechanics    Time 4   Period Weeks   Status New   PT SHORT TERM GOAL #3   Title Patient to report that she is experiencing no more than 2/10 pain in her low back with all functional weightbearing tasks of at least 60 minutes in duration    Time 4   Period Weeks   Status New   PT SHORT TERM GOAL #4   Title Patient will be independent in correctly and consistently performing appropriate HEP, to be updated PRN    Time 4   Period Weeks   Status New           PT Long Term Goals - 02/06/15 1002    PT LONG TERM GOAL #1   Title Patient will demonstrate 5/5 strength in bilateral lower extremities, at least 4/5 strength in proximal musculature, and at least 4/5 strength in general core    Time 8   Period Weeks   Status New   PT LONG TERM GOAL #2   Title Patient will demonstrate the ablity to ambulate at least 1452ft during 6 minute walk test with no rest breaks, at a gait speed of at least 1.69m/s   Time 8   Period Weeks   Status New   PT LONG TERM GOAL #3   Title Patient will demonstrate the ability to maintain SLS for at least 45 seconds each lower extremity with no HHA    Time 8   Period Weeks   Status New   PT LONG TERM GOAL #4   Title Patient will demonstrate an increase in overall FACIT-F score to at least  130 to indicate improved quality of life    Time 8   Period Weeks   Status New               Plan - 02/15/15 1045    Clinical Impression Statement Pt reports average 1.500 steps per day, has been  documenting into [employer] daily.  Session foucs on improving functional strengthening and balance training.  Added 3D hip excursion to improve hip mobilty following reports of hip stiffness.  Pt able to demonstrate appropraite techniques with all exercises following cueing for form.  Added tandem stance and side lunges for gluteal strengthening to improve balance.  Began sidestepping with red therabnad resistance, pt with increased difficutly sidestepping to Rt.  Began standing abduciton for gluteal strengtheing with tacitile and verbal cueing for form.  No reports of pain through session.     PT Next Visit Plan Continue asking steps per day and encourage to increase gradually.  Continue functional strenghtening and balance training; progress to warrior poses as able.          Problem List Patient Active Problem List   Diagnosis Date Noted  . Abnormal ECG   . DOE (dyspnea on exertion) 04/12/2014  . Annual physical exam 09/08/2013  . Screen for colon cancer 04/22/2011  . MIGRAINE HEADACHE 01/23/2009  . GERD 01/23/2009  . GASTRITIS 01/23/2009  . LOOSE STOOLS 01/23/2009  . ALLERGY 01/23/2009  . UTERINE CANCER, HX OF 01/23/2009   Ihor Austin, LPTA; Elizabeth  Aldona Lento 02/15/2015, 12:49 PM  [place] [address] [place], [name], [address] Phone: [phone]   Fax:  [phone]

## 2015-02-20 ENCOUNTER — Encounter (HOSPITAL_COMMUNITY): Payer: Self-pay

## 2015-02-20 ENCOUNTER — Ambulatory Visit (HOSPITAL_COMMUNITY): Payer: Medicare Other

## 2015-02-20 DIAGNOSIS — R29898 Other symptoms and signs involving the musculoskeletal system: Secondary | ICD-10-CM

## 2015-02-20 DIAGNOSIS — R198 Other specified symptoms and signs involving the digestive system and abdomen: Secondary | ICD-10-CM | POA: Diagnosis not present

## 2015-02-20 DIAGNOSIS — M25651 Stiffness of right hip, not elsewhere classified: Secondary | ICD-10-CM

## 2015-02-20 DIAGNOSIS — Z853 Personal history of malignant neoplasm of breast: Secondary | ICD-10-CM

## 2015-02-20 DIAGNOSIS — M25652 Stiffness of left hip, not elsewhere classified: Secondary | ICD-10-CM

## 2015-02-20 DIAGNOSIS — R6889 Other general symptoms and signs: Secondary | ICD-10-CM

## 2015-02-20 DIAGNOSIS — R262 Difficulty in walking, not elsewhere classified: Secondary | ICD-10-CM

## 2015-02-20 DIAGNOSIS — M6281 Muscle weakness (generalized): Secondary | ICD-10-CM

## 2015-02-20 DIAGNOSIS — M6289 Other specified disorders of muscle: Secondary | ICD-10-CM | POA: Diagnosis not present

## 2015-02-20 NOTE — Therapy (Signed)
[facility] [address] [place], [place], [address] Phone: [phone]   Fax:  [phone]  Physical Therapy Treatment  Patient Details  Name: Kayla Thomas MRN: [id number] Date of Birth: 1941-01-05 Referring Provider:  Allene Pyo, MD  Encounter Date: 02/20/2015      PT End of Session - 02/20/15 1019    Visit Number 5   Number of Visits 16   Date for PT Re-Evaluation 03/06/15   Authorization Type Medicare    Authorization Time Period 02/06/15 to 04/08/15   Authorization - Visit Number 5   Authorization - Number of Visits 10   PT Start Time 0930   PT Stop Time 1011   PT Time Calculation (min) 41 min   Activity Tolerance Patient tolerated treatment well   Behavior During Therapy [facility] for tasks assessed/performed      Past Medical History  Diagnosis Date  . Uterine cancer 1998  . Chronic back pain   . GERD (gastroesophageal reflux disease) 01/21/2006    EGD Dr Darrick Penna mild chronic gastritis, (NO h pylori) otherwise normal  . Sinus congestion   . Headache(784.0)   . Breast cancer   . Thumb tendonitis September 2014    left  . Breast cancer   . Wears partial dentures   . Gastric ulcer     History    Past Surgical History  Procedure Laterality Date  . Cholecystectomy  11/1999  . S/p hysterectomy  1998    uterine ca  . Temperal/mandibular  1974  . Esophagogastroduodenoscopy  01/21/06    mild antral erythema bx h-pylori/normal esophagus without evidence of mass or Barrett's/normal pylorus and duodenum  . Cataracts    . Colonoscopy  05/13/2011    Procedure: COLONOSCOPY;  Surgeon: Arlyce Harman, MD;  [facility];  Service: Endoscopy;  Laterality: N/A;  8:30  . Tonsillectomy  1964  . Abscess drainage  03-04-05    insect bite  . Cataract extraction w/phaco  10/06/2011    Procedure: CATARACT EXTRACTION PHACO AND INTRAOCULAR LENS PLACEMENT (IOC);  Surgeon: Susa Simmonds, MD;  [facility];   Service: Ophthalmology;  Laterality: Left;  CDE:  5.76  . Abdominal hysterectomy    . Partial mastectomy with needle localization and axillary sentinel lymph node bx Left 08/18/2012    Procedure: PARTIAL MASTECTOMY WITH NEEDLE LOCALIZATION AND AXILLARY SENTINEL LYMPH NODE BX;  Surgeon: [name], MD;  [facility];  Service: General;  Laterality: Left;  Need Frozen Section/Sentinel Node Bx @ 8:00am/Needle Loc @ 9:00am  . Trigger finger release Right 05/04/2014    Procedure: RELEASE TRIGGER FINGER/A-1 PULLEY RIGHT RING FINGER;  Surgeon: Cindee Salt, MD;  Location: [facility];  Service: Orthopedics;  Laterality: Right;  . Cardiac catheterization N/A 10/31/2014    Procedure: Right/Left Heart Cath and Coronary Angiography;  Surgeon: Lennette Bihari, MD;  [facility];  Service: Cardiovascular;  Laterality: N/A;    There were no vitals filed for this visit.  Visit Diagnosis:  Weakness of both lower extremities  Proximal muscle weakness  Abdominal weakness  History of breast cancer  Decreased functional activity tolerance  Difficulty walking  Hip joint stiffness, left  Stiffness of hip joint, right      Subjective Assessment - 02/20/15 1019    Subjective Pt reports she has been working on balance and strength activities. Continues to feel a little better. Right hip is not botherher today as much.  Pertinent History Patient has had low back surgery in the past-  four screws and a plate. History of uterine cancer. Was diagnosed with L breast cancer in 2014, had been taking oral cancer medications as well as radiation. No longer on radiation, just doing oral medicines.  Still doing water exercise at [facility] 3x/week, a little tired after but mostly OK afterwards.    Patient Stated Goals be able to walk without pain    Currently in Pain? No/denies                         [facility] Adult PT Treatment/Exercise - 02/20/15 0001    Therapeutic  Activites    Therapeutic Activities Work Simulation  Standing trunk rotation +extension c green physio ball taps    Knee/Hip Exercises: Aerobic   Nustep hills 3 Level 3 x 10'  cool down, no charge   Knee/Hip Exercises: Standing   Heel Raises Both;15 reps   Heel Raises Limitations Toe raises   Lateral Step Up Both;Step Height: 4";Hand Hold: 2;1 set;15 reps  added airex for second set x10 (6" total)   Forward Step Up Step Height: 4";15 reps;2 sets;Both;Other (comment)  4"+airex (6" total)   Functional Squat 10 reps;2 sets  Chair+ airex pad   Rebounder Narrow stance on airex  20x fwd, 20x 45* R, 20x 45* L   Other Standing Knee Exercises sidestepping x 2 16' bilat  RedTB around knees, forward flexed hands on knees.    Other Standing Knee Exercises tandem stance 3x 30" bilat   MinGuardA   Knee/Hip Exercises: Supine   Bridges with Clamshell 2 sets;10 reps;Strengthening;Both  GreenTB                   PT Short Term Goals - 02/06/15 0959    PT SHORT TERM GOAL #1   Title Patient to be able to verbally report the importance of maintaining good posture through all functional tasks, and will be able to maintain correct posture during all functional activities    Time 4   Period Weeks   Status New   PT SHORT TERM GOAL #2   Title Patient to demonstrate bilateral hip ER of at least 50 degrees, bilateral hip IR of at least 35 degrees in order to enhance overall mobility and mechanics    Time 4   Period Weeks   Status New   PT SHORT TERM GOAL #3   Title Patient to report that she is experiencing no more than 2/10 pain in her low back with all functional weightbearing tasks of at least 60 minutes in duration    Time 4   Period Weeks   Status New   PT SHORT TERM GOAL #4   Title Patient will be independent in correctly and consistently performing appropriate HEP, to be updated PRN    Time 4   Period Weeks   Status New           PT Long Term Goals - 02/06/15 1002    PT  LONG TERM GOAL #1   Title Patient will demonstrate 5/5 strength in bilateral lower extremities, at least 4/5 strength in proximal musculature, and at least 4/5 strength in general core    Time 8   Period Weeks   Status New   PT LONG TERM GOAL #2   Title Patient will demonstrate the ablity to ambulate at least 146ft during 6 minute walk test with no rest breaks, at  a gait speed of at least 1.57m/s   Time 8   Period Weeks   Status New   PT LONG TERM GOAL #3   Title Patient will demonstrate the ability to maintain SLS for at least 45 seconds each lower extremity with no HHA    Time 8   Period Weeks   Status New   PT LONG TERM GOAL #4   Title Patient will demonstrate an increase in overall FACIT-F score to at least 130 to indicate improved quality of life    Time 8   Period Weeks   Status New               Plan - 02/20/15 1020    Clinical Impression Statement Pt continues to make progress, demonstrating improved balance, and activity tolerance during session today. Pt is highly motivated, remains compliant with all home activities. Pt still having most difficulty with R hip weakness and limited activity tolerance during acitivites, demonstratig tachypnea throughout session.    Pt will benefit from skilled therapeutic intervention in order to improve on the following deficits Abnormal gait;Decreased endurance;Hypomobility;Decreased activity tolerance;Decreased strength;Pain;Decreased balance;Decreased mobility;Difficulty walking;Decreased coordination;Decreased safety awareness;Postural dysfunction   Rehab Potential Good   PT Frequency 2x / week   PT Duration 8 weeks   PT Treatment/Interventions ADLs/Self Care Home Management;Gait training;Stair training;Functional mobility training;Therapeutic activities;Therapeutic exercise;Balance training;Neuromuscular re-education;Patient/family education;Manual techniques;Energy conservation   PT Next Visit Plan Continue asking steps per day and  encourage to increase gradually.  Continue functional strenghtening and balance training; progress to warrior poses as able.     PT Home Exercise Plan No changes   Consulted and Agree with Plan of Care Patient        Problem List Patient Active Problem List   Diagnosis Date Noted  . Abnormal ECG   . DOE (dyspnea on exertion) 04/12/2014  . Annual physical exam 09/08/2013  . Screen for colon cancer 04/22/2011  . MIGRAINE HEADACHE 01/23/2009  . GERD 01/23/2009  . GASTRITIS 01/23/2009  . LOOSE STOOLS 01/23/2009  . ALLERGY 01/23/2009  . UTERINE CANCER, HX OF 01/23/2009    Devereaux Grayson C 02/20/2015, 10:26 AM  10:26 AM  Rosamaria Lints, PT, DPT [place] License # [id number]       [facility] [address] [place], [place], [address] Phone: [phone]   Fax:  [phone]

## 2015-02-20 NOTE — Patient Instructions (Signed)
Maintain HEP as previously given, no changes today.

## 2015-02-22 ENCOUNTER — Ambulatory Visit (HOSPITAL_COMMUNITY): Payer: Medicare Other | Attending: Hematology & Oncology | Admitting: Physical Therapy

## 2015-02-22 DIAGNOSIS — M6289 Other specified disorders of muscle: Secondary | ICD-10-CM | POA: Insufficient documentation

## 2015-02-22 DIAGNOSIS — R262 Difficulty in walking, not elsewhere classified: Secondary | ICD-10-CM | POA: Insufficient documentation

## 2015-02-22 DIAGNOSIS — R198 Other specified symptoms and signs involving the digestive system and abdomen: Secondary | ICD-10-CM | POA: Insufficient documentation

## 2015-02-22 DIAGNOSIS — R29898 Other symptoms and signs involving the musculoskeletal system: Secondary | ICD-10-CM | POA: Insufficient documentation

## 2015-02-22 DIAGNOSIS — M25652 Stiffness of left hip, not elsewhere classified: Secondary | ICD-10-CM | POA: Insufficient documentation

## 2015-02-22 DIAGNOSIS — Z853 Personal history of malignant neoplasm of breast: Secondary | ICD-10-CM | POA: Insufficient documentation

## 2015-02-22 DIAGNOSIS — R6889 Other general symptoms and signs: Secondary | ICD-10-CM | POA: Diagnosis not present

## 2015-02-22 DIAGNOSIS — M25651 Stiffness of right hip, not elsewhere classified: Secondary | ICD-10-CM | POA: Diagnosis not present

## 2015-02-22 DIAGNOSIS — M6281 Muscle weakness (generalized): Secondary | ICD-10-CM

## 2015-02-22 NOTE — Therapy (Signed)
[place], [name], [address] Phone: [phone]   Fax:  [phone]  Physical Therapy Treatment  Patient Details  Name: Kayla Thomas MRN: [id number] Date of Birth: 1940-11-03 Referring Provider:  Patrici Ranks, MD  Encounter Date: 02/22/2015      PT End of Session - 02/22/15 1205    Visit Number 6   Number of Visits 16   Date for PT Re-Evaluation 03/06/15   Authorization Type Medicare    Authorization Time Period 02/06/15 to 04/08/15   Authorization - Visit Number 6   Authorization - Number of Visits 10   PT Start Time 1100   PT Stop Time 1147   PT Time Calculation (min) 47 min   Equipment Utilized During Treatment Gait belt   Activity Tolerance Patient tolerated treatment well   Behavior During Therapy [facility] for tasks assessed/performed      Past Medical History  Diagnosis Date  . Uterine cancer 1998  . Chronic back pain   . GERD (gastroesophageal reflux disease) 01/21/2006    EGD Dr Oneida Alar mild chronic gastritis, (NO h pylori) otherwise normal  . Sinus congestion   . Headache(784.0)   . Breast cancer   . Thumb tendonitis September 2014    left  . Breast cancer   . Wears partial dentures   . Gastric ulcer     History    Past Surgical History  Procedure Laterality Date  . Cholecystectomy  11/1999  . S/p hysterectomy  1998    uterine ca  . Temperal/mandibular  1974  . Esophagogastroduodenoscopy  01/21/06    mild antral erythema bx h-pylori/normal esophagus without evidence of mass or Barrett's/normal pylorus and duodenum  . Cataracts    . Colonoscopy  05/13/2011    Procedure: COLONOSCOPY;  Surgeon: Dorothyann Peng, MD;  [facility];  Service: Endoscopy;  Laterality: N/A;  8:30  . Tonsillectomy  1964  . Abscess drainage  03-04-05    insect bite  . Cataract extraction w/phaco  10/06/2011    Procedure: CATARACT EXTRACTION PHACO AND INTRAOCULAR LENS PLACEMENT (IOC);  Surgeon:  Williams Che, MD;  [facility]: [facility];  Service: Ophthalmology;  Laterality: Left;  CDE:  5.76  . Abdominal hysterectomy    . Partial mastectomy with needle localization and axillary sentinel lymph node bx Left 08/18/2012    Procedure: PARTIAL MASTECTOMY WITH NEEDLE LOCALIZATION AND AXILLARY SENTINEL LYMPH NODE BX;  Surgeon: Jamesetta So, MD;  Location: [facility];  Service: General;  Laterality: Left;  Need Frozen Section/Sentinel Node Bx @ 8:00am/Needle Loc @ 9:00am  . Trigger finger release Right 05/04/2014    Procedure: RELEASE TRIGGER FINGER/A-1 PULLEY RIGHT RING FINGER;  Surgeon: Daryll Brod, MD;  Location: [place];  Service: Orthopedics;  Laterality: Right;  . Cardiac catheterization N/A 10/31/2014    Procedure: Right/Left Heart Cath and Coronary Angiography;  Surgeon: Troy Sine, MD;  Location: [place] [facility];  Service: Cardiovascular;  Laterality: N/A;    There were no vitals filed for this visit.  Visit Diagnosis:  Weakness of both lower extremities  Proximal muscle weakness  Abdominal weakness  Difficulty walking      Subjective Assessment - 02/22/15 1104    Subjective Pt reports that she feels that her leg is getting better, she is able to get up from sitting with less difficulty. She reports some achiness in her lower back today.    Currently in Pain? No/denies  Pain Score 0-No pain                         [facility] Adult PT Treatment/Exercise - 02/22/15 0001    Knee/Hip Exercises: Aerobic   Nustep hills 3 Level 3 x 10'  cool down, no charge   Knee/Hip Exercises: Standing   Heel Raises Both;15 reps   Heel Raises Limitations unilateral   Lateral Step Up 15 reps;Step Height: 6"  6" plus airex (8" total)   Forward Step Up 2 sets;Both;Other (comment);Step Height: 6";10 reps  6"+airex (8" total)   Rocker Board 2 minutes   Rocker Board Limitations no UE   Rebounder x10 with SLS bilaterally, x 10 with tandem stance on airex   Other  Standing Knee Exercises sidestepping x 3 16' bilat  RedTB around knees, forward flexed hands on knees.    Other Standing Knee Exercises balance beam exercises: tandem gait x 2 RT, sidestepping x 2RT                  PT Short Term Goals - 02/06/15 0959    PT SHORT TERM GOAL #1   Title Patient to be able to verbally report the importance of maintaining good posture through all functional tasks, and will be able to maintain correct posture during all functional activities    Time 4   Period Weeks   Status New   PT SHORT TERM GOAL #2   Title Patient to demonstrate bilateral hip ER of at least 50 degrees, bilateral hip IR of at least 35 degrees in order to enhance overall mobility and mechanics    Time 4   Period Weeks   Status New   PT SHORT TERM GOAL #3   Title Patient to report that she is experiencing no more than 2/10 pain in her low back with all functional weightbearing tasks of at least 60 minutes in duration    Time 4   Period Weeks   Status New   PT SHORT TERM GOAL #4   Title Patient will be independent in correctly and consistently performing appropriate HEP, to be updated PRN    Time 4   Period Weeks   Status New           PT Long Term Goals - 02/06/15 1002    PT LONG TERM GOAL #1   Title Patient will demonstrate 5/5 strength in bilateral lower extremities, at least 4/5 strength in proximal musculature, and at least 4/5 strength in general core    Time 8   Period Weeks   Status New   PT LONG TERM GOAL #2   Title Patient will demonstrate the ablity to ambulate at least 1467ft during 6 minute walk test with no rest breaks, at a gait speed of at least 1.34m/s   Time 8   Period Weeks   Status New   PT LONG TERM GOAL #3   Title Patient will demonstrate the ability to maintain SLS for at least 45 seconds each lower extremity with no HHA    Time 8   Period Weeks   Status New   PT LONG TERM GOAL #4   Title Patient will demonstrate an increase in overall  FACIT-F score to at least 130 to indicate improved quality of life    Time 8   Period Weeks   Status New               Plan - 02/22/15  1205    Clinical Impression Statement Balance training was progressed today with balance beam activities and SLS with rebounder. Pt required min A for retroambulation on unstable surface, was able to complete all other balance activities with CGA for safety. Pt continues to experience SOB during session.    PT Next Visit Plan Continue with strength and balance training, progress to warrior poses as able        Problem List Patient Active Problem List   Diagnosis Date Noted  . Abnormal ECG   . DOE (dyspnea on exertion) 04/12/2014  . Annual physical exam 09/08/2013  . Screen for colon cancer 04/22/2011  . MIGRAINE HEADACHE 01/23/2009  . GERD 01/23/2009  . GASTRITIS 01/23/2009  . LOOSE STOOLS 01/23/2009  . ALLERGY 01/23/2009  . UTERINE CANCER, HX OF 01/23/2009    Hilma Favors, PT, DPT [phone] 02/22/2015, 12:12 PM  [place], [name], [address] Phone: [phone]   Fax:  [phone]

## 2015-02-27 ENCOUNTER — Ambulatory Visit (HOSPITAL_COMMUNITY): Payer: Medicare Other | Admitting: Physical Therapy

## 2015-02-27 DIAGNOSIS — R198 Other specified symptoms and signs involving the digestive system and abdomen: Secondary | ICD-10-CM | POA: Diagnosis not present

## 2015-02-27 DIAGNOSIS — M25652 Stiffness of left hip, not elsewhere classified: Secondary | ICD-10-CM | POA: Diagnosis not present

## 2015-02-27 DIAGNOSIS — R29898 Other symptoms and signs involving the musculoskeletal system: Secondary | ICD-10-CM

## 2015-02-27 DIAGNOSIS — M6281 Muscle weakness (generalized): Secondary | ICD-10-CM

## 2015-02-27 DIAGNOSIS — R262 Difficulty in walking, not elsewhere classified: Secondary | ICD-10-CM | POA: Diagnosis not present

## 2015-02-27 DIAGNOSIS — M25651 Stiffness of right hip, not elsewhere classified: Secondary | ICD-10-CM | POA: Diagnosis not present

## 2015-02-27 DIAGNOSIS — M6289 Other specified disorders of muscle: Secondary | ICD-10-CM | POA: Diagnosis not present

## 2015-02-27 NOTE — Therapy (Signed)
[place], [name], [address] Phone: [phone]   Fax:  [phone]  Physical Therapy Treatment  Patient Details  Name: Kayla Thomas MRN: [id number] Date of Birth: May 18, 1941 Referring Provider:  Patrici Ranks, MD  Encounter Date: 02/27/2015      PT End of Session - 02/27/15 0925    Visit Number 7   Number of Visits 16   Date for PT Re-Evaluation 03/06/15   Authorization Type Medicare    Authorization Time Period 02/06/15 to 04/08/15   Authorization - Visit Number 7   Authorization - Number of Visits 10   PT Start Time 0845   PT Stop Time 0932   PT Time Calculation (min) 47 min   Equipment Utilized During Treatment Gait belt   Activity Tolerance Patient tolerated treatment well   Behavior During Therapy [facility] for tasks assessed/performed      Past Medical History  Diagnosis Date  . Uterine cancer 1998  . Chronic back pain   . GERD (gastroesophageal reflux disease) 01/21/2006    EGD Dr Oneida Alar mild chronic gastritis, (NO h pylori) otherwise normal  . Sinus congestion   . Headache(784.0)   . Breast cancer   . Thumb tendonitis September 2014    left  . Breast cancer   . Wears partial dentures   . Gastric ulcer     History    Past Surgical History  Procedure Laterality Date  . Cholecystectomy  11/1999  . S/p hysterectomy  1998    uterine ca  . Temperal/mandibular  1974  . Esophagogastroduodenoscopy  01/21/06    mild antral erythema bx h-pylori/normal esophagus without evidence of mass or Barrett's/normal pylorus and duodenum  . Cataracts    . Colonoscopy  05/13/2011    Procedure: COLONOSCOPY;  Surgeon: Dorothyann Peng, MD;  [facility];  Service: Endoscopy;  Laterality: N/A;  8:30  . Tonsillectomy  1964  . Abscess drainage  03-04-05    insect bite  . Cataract extraction w/phaco  10/06/2011    Procedure: CATARACT EXTRACTION PHACO AND INTRAOCULAR LENS PLACEMENT (IOC);  Surgeon:  Williams Che, MD;  [facility];  Service: Ophthalmology;  Laterality: Left;  CDE:  5.76  . Abdominal hysterectomy    . Partial mastectomy with needle localization and axillary sentinel lymph node bx Left 08/18/2012    Procedure: PARTIAL MASTECTOMY WITH NEEDLE LOCALIZATION AND AXILLARY SENTINEL LYMPH NODE BX;  Surgeon: Jamesetta So, MD;  [facility]: [facility];  Service: General;  Laterality: Left;  Need Frozen Section/Sentinel Node Bx @ 8:00am/Needle Loc @ 9:00am  . Trigger finger release Right 05/04/2014    Procedure: RELEASE TRIGGER FINGER/A-1 PULLEY RIGHT RING FINGER;  Surgeon: Daryll Brod, MD;  Location: [place];  Service: Orthopedics;  Laterality: Right;  . Cardiac catheterization N/A 10/31/2014    Procedure: Right/Left Heart Cath and Coronary Angiography;  Surgeon: Troy Sine, MD;  Location: [place] [facility];  Service: Cardiovascular;  Laterality: N/A;    There were no vitals filed for this visit.  Visit Diagnosis:  Weakness of both lower extremities  Proximal muscle weakness  Difficulty walking      Subjective Assessment - 02/27/15 0848    Subjective Pt reports that she is still having some pain in her R hip and thigh when she first starts walking.    Currently in Pain? No/denies   Pain Score 0-No pain  [facility] Adult PT Treatment/Exercise - 02/27/15 0001    Balance Poses: Yoga   Warrior I 30 seconds;2 reps   Knee/Hip Exercises: Aerobic   Nustep hills 3 Level 3 x 10'  cool down, no charge   Knee/Hip Exercises: Standing   Heel Raises Both;15 reps   Heel Raises Limitations unilateral   Lateral Step Up 15 reps;Step Height: 8"  6" plus airex (8" total)   Forward Step Up 15 reps;Step Height: 8"  6" + airex (8")   Functional Squat 10 reps  21" mat table   Rocker Board 2 minutes   Rocker Board Limitations no UE   Other Standing Knee Exercises sidestepping with green tband around ankles x2 RT   Other Standing  Knee Exercises balance beam exercises: tandem gait x 2 RT, sidestepping x 2RT, 6" hurdles x 2 RT                PT Education - 02/27/15 0924    Education provided Yes   Education Details warrior I added to [employer]) Educated Patient   Methods Explanation;Handout   Comprehension Verbalized understanding;Returned demonstration          PT Short Term Goals - 02/06/15 0959    PT SHORT TERM GOAL #1   Title Patient to be able to verbally report the importance of maintaining good posture through all functional tasks, and will be able to maintain correct posture during all functional activities    Time 4   Period Weeks   Status New   PT SHORT TERM GOAL #2   Title Patient to demonstrate bilateral hip ER of at least 50 degrees, bilateral hip IR of at least 35 degrees in order to enhance overall mobility and mechanics    Time 4   Period Weeks   Status New   PT SHORT TERM GOAL #3   Title Patient to report that she is experiencing no more than 2/10 pain in her low back with all functional weightbearing tasks of at least 60 minutes in duration    Time 4   Period Weeks   Status New   PT SHORT TERM GOAL #4   Title Patient will be independent in correctly and consistently performing appropriate HEP, to be updated PRN    Time 4   Period Weeks   Status New           PT Long Term Goals - 02/06/15 1002    PT LONG TERM GOAL #1   Title Patient will demonstrate 5/5 strength in bilateral lower extremities, at least 4/5 strength in proximal musculature, and at least 4/5 strength in general core    Time 8   Period Weeks   Status New   PT LONG TERM GOAL #2   Title Patient will demonstrate the ablity to ambulate at least 1460ft during 6 minute walk test with no rest breaks, at a gait speed of at least 1.59m/s   Time 8   Period Weeks   Status New   PT LONG TERM GOAL #3   Title Patient will demonstrate the ability to maintain SLS for at least 45 seconds each lower extremity with  no HHA    Time 8   Period Weeks   Status New   PT LONG TERM GOAL #4   Title Patient will demonstrate an increase in overall FACIT-F score to at least 130 to indicate improved quality of life    Time 8   Period Weeks  Status New               Plan - 02/27/15 8325    Clinical Impression Statement Treatment session continued focus on balance and strength training. Balance training was progressed with addition of 6" hurdles to balance beam and with warrior I pose. Pt required multimodal cueing for proper form during warrior I pose. Pt denied any increased pain following treatment today, and had no episodes of LOB during balance training.    PT Next Visit Plan Continue with balance and strength training, add warrior II        Problem List Patient Active Problem List   Diagnosis Date Noted  . Abnormal ECG   . DOE (dyspnea on exertion) 04/12/2014  . Annual physical exam 09/08/2013  . Screen for colon cancer 04/22/2011  . MIGRAINE HEADACHE 01/23/2009  . GERD 01/23/2009  . GASTRITIS 01/23/2009  . LOOSE STOOLS 01/23/2009  . ALLERGY 01/23/2009  . UTERINE CANCER, HX OF 01/23/2009    Hilma Favors, PT, DPT [phone] 02/27/2015, 9:30 AM  [place], [name], [address] Phone: [phone]   Fax:  [phone]

## 2015-02-27 NOTE — Patient Instructions (Signed)
Warrior I Pose   In wide stride stance, feet facing forward, bend right knee and extend left leg behind, heel elevated. Distribute weight equally between front foot and ball of back foot. Extend arms upward beside ears. Hold for _30__ seconds. Repeat with other leg forward. Repeat _2__ times, alternating legs. Do _1__ times per day.  Copyright  VHI. All rights reserved.

## 2015-02-28 ENCOUNTER — Encounter (HOSPITAL_COMMUNITY): Payer: Medicare Other | Attending: Hematology & Oncology | Admitting: Hematology & Oncology

## 2015-02-28 VITALS — BP 129/50 | HR 65 | Temp 98.6°F | Resp 18 | Wt 189.9 lb

## 2015-02-28 DIAGNOSIS — C50919 Malignant neoplasm of unspecified site of unspecified female breast: Secondary | ICD-10-CM | POA: Insufficient documentation

## 2015-02-28 DIAGNOSIS — C55 Malignant neoplasm of uterus, part unspecified: Secondary | ICD-10-CM

## 2015-02-28 DIAGNOSIS — C50912 Malignant neoplasm of unspecified site of left female breast: Secondary | ICD-10-CM | POA: Diagnosis not present

## 2015-02-28 DIAGNOSIS — M549 Dorsalgia, unspecified: Secondary | ICD-10-CM

## 2015-02-28 DIAGNOSIS — R5383 Other fatigue: Secondary | ICD-10-CM

## 2015-02-28 NOTE — Patient Instructions (Signed)
..  [facility] Discharge Instructions  RECOMMENDATIONS MADE BY THE CONSULTANT AND ANY TEST RESULTS WILL BE SENT TO YOUR REFERRING PHYSICIAN.  Return in two months.   Thank you for choosing [place] [facility] to provide your oncology and hematology care.  To afford each patient quality time with our provider, please arrive at least 15 minutes before your scheduled appointment time.    You need to re-schedule your appointment should you arrive 10 or more minutes late.  We strive to give you quality time with our providers, and arriving late affects you and other patients whose appointments are after yours.  Also, if you no show three or more times for appointments you may be dismissed from the clinic at the providers discretion.     Again, thank you for choosing [facility].  Our hope is that these requests will decrease the amount of time that you wait before being seen by our physicians.       _____________________________________________________________  Should you have questions after your visit to [facility], please contact our office at [phone] between the hours of 8:30 a.m. and 4:30 p.m.  Voicemails left after 4:30 p.m. will not be returned until the following business day.  For prescription refill requests, have your pharmacy contact our office.

## 2015-02-28 NOTE — Progress Notes (Signed)
[facility] NOTE  Patient Care Team: Asencion Noble, MD as PCP - General (Internal Medicine) Danie Binder, MD (Gastroenterology) Herminio Commons, MD as Attending Physician (Cardiology)  CHIEF COMPLAINTS/PURPOSE OF CONSULTATION:  Stage I left breast cancer, s/p Lumpectomy, radiotherapy and arimidex Arimidex started on 12/21/2012 History of uterine carcinoma s/p hysterectomy DEXA 12/12/2014 with normal bone density  HISTORY OF PRESENTING ILLNESS:  Kayla Thomas 74 y.o. female is here because of Stage I Left Breast Cancer. She had been on Arimidex but at her last visit remarked that she was unable to do her ADLs secondary to significant aching and fatigue. After discussion of risks and benefits she opted to take a break from her Arimidex therapy. She did feel better after discontinuing arimidex with improvement in her energy. She was started on Tamoxifen at her last visit.  She states that she feels better overall. She says she feels good, has ambition, has energy, and has been out doing more things. When she tries to walk very far, her lower back and hip start hurting. She believes her lower back and hip are causing her trouble with walking. She also has trouble with stairs.  She has been going to therapy, and the therapist says that her lower back and hips are stiff, but believes that further therapy will help. She states that she probably has 6 more visits to the therapist.  MEDICAL HISTORY:  Past Medical History  Diagnosis Date  . Uterine cancer 1998  . Chronic back pain   . GERD (gastroesophageal reflux disease) 01/21/2006    EGD Dr Oneida Alar mild chronic gastritis, (NO h pylori) otherwise normal  . Sinus congestion   . Headache(784.0)   . Breast cancer   . Thumb tendonitis September 2014    left  . Breast cancer   . Wears partial dentures   . Gastric ulcer     History    SURGICAL HISTORY: Past Surgical History    Procedure Laterality Date  . Cholecystectomy  11/1999  . S/p hysterectomy  1998    uterine ca  . Temperal/mandibular  1974  . Esophagogastroduodenoscopy  01/21/06    mild antral erythema bx h-pylori/normal esophagus without evidence of mass or Barrett's/normal pylorus and duodenum  . Cataracts    . Colonoscopy  05/13/2011    Procedure: COLONOSCOPY;  Surgeon: Dorothyann Peng, MD;  [facility];  Service: Endoscopy;  Laterality: N/A;  8:30  . Tonsillectomy  1964  . Abscess drainage  03-04-05    insect bite  . Cataract extraction w/phaco  10/06/2011    Procedure: CATARACT EXTRACTION PHACO AND INTRAOCULAR LENS PLACEMENT (IOC);  Surgeon: Williams Che, MD;  Location: [facility];  Service: Ophthalmology;  Laterality: Left;  CDE:  5.76  . Abdominal hysterectomy    . Partial mastectomy with needle localization and axillary sentinel lymph node bx Left 08/18/2012    Procedure: PARTIAL MASTECTOMY WITH NEEDLE LOCALIZATION AND AXILLARY SENTINEL LYMPH NODE BX;  Surgeon: Jamesetta So, MD;  Location: [facility];  Service: General;  Laterality: Left;  Need Frozen Section/Sentinel Node Bx @ 8:00am/Needle Loc @ 9:00am  . Trigger finger release Right 05/04/2014    Procedure: RELEASE TRIGGER FINGER/A-1 PULLEY RIGHT RING FINGER;  Surgeon: Daryll Brod, MD;  Location: [facility];  Service: Orthopedics;  Laterality: Right;  . Cardiac catheterization N/A 10/31/2014    Procedure: Right/Left Heart Cath and Coronary Angiography;  Surgeon: Joyice Faster  Claiborne Billings, MD;  Location: [facility];  Service: Cardiovascular;  Laterality: N/A;    SOCIAL HISTORY: Social History   Social History  . Marital Status: Widowed    Spouse Name: N/A  . Number of Children: 1  . Years of Education: N/A   Occupational History  . retired; Facilities manager asst    Social History Main Topics  . Smoking status: Never Smoker   . Smokeless tobacco: Never Used  . Alcohol Use: No  . Drug Use: No  . Sexual Activity: Not Currently     Birth Control/ Protection: Surgical   Other Topics Concern  . Not on file   Social History Narrative   1 adopted son-grown   Lives alone    FAMILY HISTORY: Family History  Problem Relation Age of Onset  . Liver cancer Father   . Alcohol abuse Father   . Hypertension Mother   . COPD Mother   . Diabetes Brother    indicated that her mother is deceased. She indicated that her father is deceased. She indicated that both of her brothers are alive.   ALLERGIES:  is allergic to aspirin; iron; sulfa antibiotics; and ancef.  MEDICATIONS:  Current Outpatient Prescriptions  Medication Sig Dispense Refill  . B Complex-C (SUPER B COMPLEX PO) Take 1 tablet by mouth every morning.     . Calcium Carb-Cholecalciferol (CALCIUM 600/VITAMIN D3) 600-800 MG-UNIT TABS Take 1 capsule by mouth 2 (two) times daily.    . diclofenac sodium (VOLTAREN) 1 % GEL     . Glucosamine-Chondroitin (GLUCOSAMINE CHONDR COMPLEX PO) Take 1 tablet by mouth 2 (two) times daily.     Marland Kitchen ipratropium (ATROVENT) 0.03 % nasal spray Place 2 sprays into the nose every 12 (twelve) hours.    Marland Kitchen loperamide (IMODIUM) 2 MG capsule Take 2 mg by mouth 4 (four) times daily as needed. For diarrhea    . LUTEIN PO Take 45 mg by mouth daily.     . Multiple Vitamins-Minerals (MULTIVITAMINS THER. W/MINERALS) TABS Take 1 tablet by mouth every morning.     Marland Kitchen omeprazole (PRILOSEC) 20 MG capsule Take 20 mg by mouth 2 (two) times daily before a meal.     . tamoxifen (NOLVADEX) 20 MG tablet Take 1 tablet (20 mg total) by mouth daily. 30 tablet 3  . TURMERIC PO Take 1 tablet by mouth daily.     . Diphenhydramine-PE-APAP 25-5-325 MG TABS Take 1 tablet by mouth at bedtime.     No current facility-administered medications for this visit.    Review of Systems  Constitutional: Negative.      Her malaise/fatigue has improved.  Musculoskeletal: Positive for joint pain, chronic arthritis, but general joint aching improved since discontinuation of  arimidex      Osteoarthritis, "aches"   14 point ROS was done and is otherwise as detailed above or in HPI   PHYSICAL EXAMINATION: ECOG PERFORMANCE STATUS: 1 - Symptomatic but completely ambulatory  Filed Vitals:   02/28/15 1047  BP: 129/50  Pulse: 65  Temp: 98.6 F (37 C)  Resp: 18   Filed Weights   02/28/15 1047  Weight: 189 lb 14.4 oz (86.138 kg)    Physical Exam  Constitutional: She is oriented to person, place, and time and well-developed, well-nourished, and in no distress.  HENT:  Head: Normocephalic and atraumatic.  Nose: Nose normal.  Mouth/Throat: Oropharynx is clear and moist. No oropharyngeal exudate.  Eyes: Conjunctivae and EOM are normal. Pupils are equal, round, and reactive to  light. Right eye exhibits no discharge. Left eye exhibits no discharge. No scleral icterus.  Neck: Normal range of motion. Neck supple. No tracheal deviation present. No thyromegaly present.  Cardiovascular: Normal rate, regular rhythm and normal heart sounds.  Exam reveals no gallop and no friction rub.   No murmur heard. Pulmonary/Chest: Effort normal and breath sounds normal. She has no wheezes. She has no rales.  Abdominal: Soft. Bowel sounds are normal. She exhibits no distension and no mass. There is no tenderness. There is no rebound and no guarding.  Musculoskeletal: Normal range of motion. She exhibits no edema. significant OA of the hands Lymphadenopathy:    She has no cervical adenopathy.  Neurological: She is alert and oriented to person, place, and time. She has normal reflexes. No cranial nerve deficit. Gait normal. Coordination normal.  Skin: Skin is warm and dry. No rash noted.  Large surgical incision site in mid-back.   Psychiatric: Mood, memory, affect and judgment normal.  Nursing note and vitals reviewed.   LABORATORY DATA:  I have reviewed the data as listed Lab Results  Component Value Date   WBC 7.3 11/30/2014   HGB 13.7 11/30/2014   HCT 39.7 11/30/2014     MCV 94.1 11/30/2014   PLT 208 11/30/2014   CMP     Component Value Date/Time   NA 139 11/30/2014 1220   K 4.3 11/30/2014 1220   CL 104 11/30/2014 1220   CO2 26 11/30/2014 1220   GLUCOSE 90 11/30/2014 1220   BUN 17 11/30/2014 1220   CREATININE 0.75 11/30/2014 1220   CREATININE 0.81 10/30/2014 0931   CALCIUM 9.2 11/30/2014 1220   PROT 6.7 11/30/2014 1220   ALBUMIN 4.3 11/30/2014 1220   AST 22 11/30/2014 1220   ALT 29 11/30/2014 1220   ALKPHOS 120 11/30/2014 1220   BILITOT 0.5 11/30/2014 1220   GFRNONAA >60 11/30/2014 1220   GFRAA >60 11/30/2014 1220      RADIOLOGY:   CLINICAL DATA: History of left lumpectomy 2014.  EXAM: DIGITAL DIAGNOSTIC BILATERAL MAMMOGRAM WITH 3D TOMOSYNTHESIS AND CAD  COMPARISON: 07/28/2012 and earlier  ACR Breast Density Category b: There are scattered areas of fibroglandular density.  FINDINGS: Postoperative changes are seen in the left breast. No suspicious mass, distortion, or microcalcifications are identified to suggest presence of malignancy.  Mammographic images were processed with CAD.  IMPRESSION: No mammographic evidence for malignancy.  RECOMMENDATION: Diagnostic mammogram is suggested in 1 year. (Code:DM-B-01Y)  I have discussed the findings and recommendations with the patient. Results were also provided in writing at the conclusion of the visit. If applicable, a reminder letter will be sent to the patient regarding the next appointment.  BI-RADS CATEGORY 2: Benign.   Electronically Signed  By: Nolon Nations M.D.  On: 11/28/2014 10:24      ASSESSMENT & PLAN:  Stage I left breast cancer, s/p Lumpectomy, radiotherapy and arimidex Arimidex started on 12/21/2012 History of uterine carcinoma Arimidex induced joint pain and fatigue Back pain, OA   She is doing much better on Tamoxifen. She feels unable to tolerate arimidex and notes a significant improvement in her well being off the AI. She  has significant OA and I suspect that is the ongoing issue with her back. She is to continue with therapy. We can consider imaging if she has no improvement over the next few weeks of therapy.  I will see her back in 2 months.  All questions were answered. The patient knows to call the clinic with  any problems, questions or concerns.   This document serves as a record of services personally performed by Ancil Linsey, MD. It was created on her behalf by Toni Amend, a trained medical scribe. The creation of this record is based on the scribe's personal observations and the provider's statements to them. This document has been checked and approved by the attending provider.  I have reviewed the above documentation for accuracy and completeness, and I agree with the above.  This note was electronically signed.   Patrici Ranks, MD

## 2015-03-01 ENCOUNTER — Ambulatory Visit (HOSPITAL_COMMUNITY): Payer: Medicare Other

## 2015-03-01 ENCOUNTER — Encounter (HOSPITAL_COMMUNITY): Payer: Self-pay | Admitting: Hematology & Oncology

## 2015-03-01 DIAGNOSIS — R29898 Other symptoms and signs involving the musculoskeletal system: Secondary | ICD-10-CM

## 2015-03-01 DIAGNOSIS — M25651 Stiffness of right hip, not elsewhere classified: Secondary | ICD-10-CM

## 2015-03-01 DIAGNOSIS — R262 Difficulty in walking, not elsewhere classified: Secondary | ICD-10-CM

## 2015-03-01 DIAGNOSIS — M25652 Stiffness of left hip, not elsewhere classified: Secondary | ICD-10-CM

## 2015-03-01 DIAGNOSIS — R198 Other specified symptoms and signs involving the digestive system and abdomen: Secondary | ICD-10-CM

## 2015-03-01 DIAGNOSIS — R6889 Other general symptoms and signs: Secondary | ICD-10-CM

## 2015-03-01 DIAGNOSIS — M6289 Other specified disorders of muscle: Secondary | ICD-10-CM | POA: Diagnosis not present

## 2015-03-01 DIAGNOSIS — Z853 Personal history of malignant neoplasm of breast: Secondary | ICD-10-CM

## 2015-03-01 DIAGNOSIS — M6281 Muscle weakness (generalized): Secondary | ICD-10-CM

## 2015-03-01 NOTE — Therapy (Signed)
[place], [name], [address] Phone: [phone]   Fax:  [phone]  Physical Therapy Treatment  Patient Details  Name: Kayla Thomas MRN: [id number] Date of Birth: 1941/05/25 Referring Provider:  Patrici Ranks, MD  Encounter Date: 03/01/2015      PT End of Session - 03/01/15 1243    Visit Number 8   Number of Visits 16   Date for PT Re-Evaluation 03/06/15   Authorization Type Medicare    Authorization Time Period 02/06/15 to 04/08/15   Authorization - Visit Number 8   Authorization - Number of Visits 10   PT Start Time 0945   PT Stop Time 1023   PT Time Calculation (min) 38 min   Activity Tolerance Patient tolerated treatment well;Patient limited by pain   Behavior During Therapy [facility] for tasks assessed/performed      Past Medical History  Diagnosis Date  . Uterine cancer 1998  . Chronic back pain   . GERD (gastroesophageal reflux disease) 01/21/2006    EGD Dr Oneida Alar mild chronic gastritis, (NO h pylori) otherwise normal  . Sinus congestion   . Headache(784.0)   . Breast cancer   . Thumb tendonitis September 2014    left  . Breast cancer   . Wears partial dentures   . Gastric ulcer     History    Past Surgical History  Procedure Laterality Date  . Cholecystectomy  11/1999  . S/p hysterectomy  1998    uterine ca  . Temperal/mandibular  1974  . Esophagogastroduodenoscopy  01/21/06    mild antral erythema bx h-pylori/normal esophagus without evidence of mass or Barrett's/normal pylorus and duodenum  . Cataracts    . Colonoscopy  05/13/2011    Procedure: COLONOSCOPY;  Surgeon: Dorothyann Peng, MD;  [facility];  Service: Endoscopy;  Laterality: N/A;  8:30  . Tonsillectomy  1964  . Abscess drainage  03-04-05    insect bite  . Cataract extraction w/phaco  10/06/2011    Procedure: CATARACT EXTRACTION PHACO AND INTRAOCULAR LENS PLACEMENT (IOC);  Surgeon: Williams Che, MD;   [facility];  Service: Ophthalmology;  Laterality: Left;  CDE:  5.76  . Abdominal hysterectomy    . Partial mastectomy with needle localization and axillary sentinel lymph node bx Left 08/18/2012    Procedure: PARTIAL MASTECTOMY WITH NEEDLE LOCALIZATION AND AXILLARY SENTINEL LYMPH NODE BX;  Surgeon: Jamesetta So, MD;  [facility];  Service: General;  Laterality: Left;  Need Frozen Section/Sentinel Node Bx @ 8:00am/Needle Loc @ 9:00am  . Trigger finger release Right 05/04/2014    Procedure: RELEASE TRIGGER FINGER/A-1 PULLEY RIGHT RING FINGER;  Surgeon: Daryll Brod, MD;  Location: [facility];  Service: Orthopedics;  Laterality: Right;  . Cardiac catheterization N/A 10/31/2014    Procedure: Right/Left Heart Cath and Coronary Angiography;  Surgeon: Troy Sine, MD;  Location: [facility];  Service: Cardiovascular;  Laterality: N/A;    There were no vitals filed for this visit.  Visit Diagnosis:  Weakness of both lower extremities  Proximal muscle weakness  Difficulty walking  Abdominal weakness  History of breast cancer  Decreased functional activity tolerance  Hip joint stiffness, left  Stiffness of hip joint, right      Subjective Assessment - 03/01/15 0949    Subjective Pt reports she has spoken to PCP regarding hip pain that he would like her to comlete PT and then consider some  images if still needed. PT reports consistent 3/10 pain with initital onset of activity that resolves with activtiy eventually.    Pertinent History Patient has had low back surgery in the past-  four screws and a plate. History of uterine cancer. Was diagnosed with L breast cancer in 2014, had been taking oral cancer medications as well as radiation. No longer on radiation, just doing oral medicines.  Still doing water exercise at [facility] 3x/week, a little tired after but mostly OK afterwards.    Patient Stated Goals be able to walk without pain    Currently in Pain? Yes    Pain Score 3    Pain Location Hip   Pain Orientation Right                         [facility] Adult PT Treatment/Exercise - 03/01/15 0001    Knee/Hip Exercises: Aerobic   Nustep hills 3 Level 3 x 10'  warm up , no charge, 5 min   Knee/Hip Exercises: Standing   Heel Raises Both;15 reps   Heel Raises Limitations unilateral   Functional Squat 10 reps;2 sets  21" mat table, higher to avoid pain    Knee/Hip Exercises: Supine   Other Supine Knee/Hip Exercises Manually resisted isometric R hip IR, hip at 90 degrees  2x10, 3 second holds.   Manual Therapy   Manual Therapy Manual Traction;Joint mobilization   Manual therapy comments Myofascial release R posterio glute med  5 minutes   Joint Mobilization Inferior glide hip at 90 degrees 2x30sec, painful not tolerated well, DC stat  Lateral glide, hip at 90 c mobilization belt; 3x30sec   Manual Traction Longtitudinal RLE distraction of Hip: 4 minutes                PT Education - 03/01/15 1241    Education provided Yes   Education Details Apply heat ad lib to R posterior glute med area, and ice ad lib around the R trochanter for pain control.    Person(s) Educated Patient   Methods Explanation   Comprehension Verbalized understanding          PT Short Term Goals - 02/06/15 0959    PT SHORT TERM GOAL #1   Title Patient to be able to verbally report the importance of maintaining good posture through all functional tasks, and will be able to maintain correct posture during all functional activities    Time 4   Period Weeks   Status New   PT SHORT TERM GOAL #2   Title Patient to demonstrate bilateral hip ER of at least 50 degrees, bilateral hip IR of at least 35 degrees in order to enhance overall mobility and mechanics    Time 4   Period Weeks   Status New   PT SHORT TERM GOAL #3   Title Patient to report that she is experiencing no more than 2/10 pain in her low back with all functional weightbearing tasks of  at least 60 minutes in duration    Time 4   Period Weeks   Status New   PT SHORT TERM GOAL #4   Title Patient will be independent in correctly and consistently performing appropriate HEP, to be updated PRN    Time 4   Period Weeks   Status New           PT Long Term Goals - 02/06/15 1002    PT LONG TERM GOAL #1   Title  Patient will demonstrate 5/5 strength in bilateral lower extremities, at least 4/5 strength in proximal musculature, and at least 4/5 strength in general core    Time 8   Period Weeks   Status New   PT LONG TERM GOAL #2   Title Patient will demonstrate the ablity to ambulate at least 1480ft during 6 minute walk test with no rest breaks, at a gait speed of at least 1.10m/s   Time 8   Period Weeks   Status New   PT LONG TERM GOAL #3   Title Patient will demonstrate the ability to maintain SLS for at least 45 seconds each lower extremity with no HHA    Time 8   Period Weeks   Status New   PT LONG TERM GOAL #4   Title Patient will demonstrate an increase in overall FACIT-F score to at least 130 to indicate improved quality of life    Time 8   Period Weeks   Status New               Plan - 03/01/15 1249    Clinical Impression Statement Treatment session focussed slightly more on investigation of consistent c/o hip pain on R. Pt describing S/Sx related to hip OA, but does not respond well to joint mobilization and/or distraction. R hip presenting with significant loss of rotational mobility with 15 degrees internal rotation and less than 40 degrees external rotation, quite painful into ER. Pt demonstrating  signs commonly associated with R gluteal  tendinopathy/bursitis. Pt will benefit fromexpanding current treatment plan to also address the R posterior hip muscle stiffess, and painful contributions from R hip IR. Pt demonstrating good progress existing plan of care activities today,, but continues to demonstrate altered gait, antalgically, as related to CC of  R hip pain.    Pt will benefit from skilled therapeutic intervention in order to improve on the following deficits Abnormal gait;Decreased endurance;Hypomobility;Decreased activity tolerance;Decreased strength;Pain;Decreased balance;Decreased mobility;Difficulty walking;Decreased coordination;Decreased safety awareness;Postural dysfunction   Rehab Potential Good   PT Frequency 2x / week   PT Duration 8 weeks   PT Treatment/Interventions ADLs/Self Care Home Management;Gait training;Stair training;Functional mobility training;Therapeutic activities;Therapeutic exercise;Balance training;Neuromuscular re-education;Patient/family education;Manual techniques;Energy conservation   PT Next Visit Plan Continue with balance and strength training, add warrior II   PT Home Exercise Plan No changes   Consulted and Agree with Plan of Care Patient        Problem List Patient Active Problem List   Diagnosis Date Noted  . Abnormal ECG   . DOE (dyspnea on exertion) 04/12/2014  . Annual physical exam 09/08/2013  . Screen for colon cancer 04/22/2011  . MIGRAINE HEADACHE 01/23/2009  . GERD 01/23/2009  . GASTRITIS 01/23/2009  . LOOSE STOOLS 01/23/2009  . ALLERGY 01/23/2009  . UTERINE CANCER, HX OF 01/23/2009    Buccola,Allan C 03/01/2015, 12:59 PM  12:59 PM  Etta Grandchild, PT, DPT [place] License # [id number]       [facility] [address] [place], [name], [address] Phone: [phone]   Fax:  [phone]

## 2015-03-06 ENCOUNTER — Ambulatory Visit (HOSPITAL_COMMUNITY): Payer: Medicare Other | Admitting: Physical Therapy

## 2015-03-06 DIAGNOSIS — R29898 Other symptoms and signs involving the musculoskeletal system: Secondary | ICD-10-CM

## 2015-03-06 DIAGNOSIS — M25652 Stiffness of left hip, not elsewhere classified: Secondary | ICD-10-CM | POA: Diagnosis not present

## 2015-03-06 DIAGNOSIS — R198 Other specified symptoms and signs involving the digestive system and abdomen: Secondary | ICD-10-CM | POA: Diagnosis not present

## 2015-03-06 DIAGNOSIS — R262 Difficulty in walking, not elsewhere classified: Secondary | ICD-10-CM

## 2015-03-06 DIAGNOSIS — M6281 Muscle weakness (generalized): Secondary | ICD-10-CM

## 2015-03-06 DIAGNOSIS — M25651 Stiffness of right hip, not elsewhere classified: Secondary | ICD-10-CM | POA: Diagnosis not present

## 2015-03-06 DIAGNOSIS — M6289 Other specified disorders of muscle: Secondary | ICD-10-CM | POA: Diagnosis not present

## 2015-03-06 NOTE — Therapy (Signed)
[place], [name], [address] Phone: [phone]   Fax:  [phone]  Physical Therapy Treatment  Patient Details  Name: Kayla Thomas MRN: [id number] Date of Birth: [birth year] Referring Provider:  Patrici Ranks, MD  Encounter Date: 03/06/2015      PT End of Session - 03/06/15 1156    Visit Number 9   Number of Visits 16   Date for PT Re-Evaluation 04/05/15   Authorization Type Medicare    Authorization Time Period 02/06/15 to 04/08/15- gcodes done 9th visit   Authorization - Visit Number 9   Authorization - Number of Visits 19   PT Start Time 0930   PT Stop Time 1018   PT Time Calculation (min) 48 min   Equipment Utilized During Treatment Gait belt   Activity Tolerance Patient tolerated treatment well   Behavior During Therapy [facility] for tasks assessed/performed      Past Medical History  Diagnosis Date  . Uterine cancer 1998  . Chronic back pain   . GERD (gastroesophageal reflux disease) 01/21/2006    EGD Dr Oneida Alar mild chronic gastritis, (NO h pylori) otherwise normal  . Sinus congestion   . Headache(784.0)   . Breast cancer   . Thumb tendonitis September 2014    left  . Breast cancer   . Wears partial dentures   . Gastric ulcer     History    Past Surgical History  Procedure Laterality Date  . Cholecystectomy  11/1999  . S/p hysterectomy  1998    uterine ca  . Temperal/mandibular  1974  . Esophagogastroduodenoscopy  01/21/06    mild antral erythema bx h-pylori/normal esophagus without evidence of mass or Barrett's/normal pylorus and duodenum  . Cataracts    . Colonoscopy  05/13/2011    Procedure: COLONOSCOPY;  Surgeon: Dorothyann Peng, MD;  [facility];  Service: Endoscopy;  Laterality: N/A;  8:30  . Tonsillectomy  1964  . Abscess drainage  03-04-05    insect bite  . Cataract extraction w/phaco  10/06/2011    Procedure: CATARACT EXTRACTION PHACO AND INTRAOCULAR LENS  PLACEMENT (IOC);  Surgeon: Williams Che, MD;  Location: [facility];  Service: Ophthalmology;  Laterality: Left;  CDE:  5.76  . Abdominal hysterectomy    . Partial mastectomy with needle localization and axillary sentinel lymph node bx Left 08/18/2012    Procedure: PARTIAL MASTECTOMY WITH NEEDLE LOCALIZATION AND AXILLARY SENTINEL LYMPH NODE BX;  Surgeon: Jamesetta So, MD;  Location: [facility];  Service: General;  Laterality: Left;  Need Frozen Section/Sentinel Node Bx @ 8:00am/Needle Loc @ 9:00am  . Trigger finger release Right 05/04/2014    Procedure: RELEASE TRIGGER FINGER/A-1 PULLEY RIGHT RING FINGER;  Surgeon: Daryll Brod, MD;  Location: [place];  Service: Orthopedics;  Laterality: Right;  . Cardiac catheterization N/A 10/31/2014    Procedure: Right/Left Heart Cath and Coronary Angiography;  Surgeon: Troy Sine, MD;  Location: [place] [facility];  Service: Cardiovascular;  Laterality: N/A;    There were no vitals filed for this visit.  Visit Diagnosis:  Weakness of both lower extremities  Proximal muscle weakness  Difficulty walking  Abdominal weakness      Subjective Assessment - 03/06/15 0942    Subjective Pt reports that she has been feeling pretty good lately. She still has difficulty with going up and down steps, and at time, she has pain in her RLE when walking. Pt reports that  she feels like she has more energy and motivation, she believes her walking has improved, and that her balance has gotten better.    How long can you sit comfortably? no limitations   How long can you stand comfortably? no limitations   How long can you walk comfortably? 15 minutes   Currently in Pain? No/denies   Pain Score 0-No pain            [facility] PT Assessment - 03/06/15 0001    Observation/Other Assessments   Focus on Therapeutic Outcomes (FOTO)  FACIT-F total 143   AROM   Right Hip External Rotation  37   Right Hip Internal Rotation  36   Left Hip External Rotation  50    Left Hip Internal Rotation  29   Strength   Right Hip Flexion 4+/5   Right Hip Extension 4-/5   Right Hip ABduction 4-/5   Left Hip Flexion 4+/5   Left Hip Extension 3+/5   Left Hip ABduction 3+/5   Right Knee Flexion 4/5   Right Knee Extension 5/5   Left Knee Flexion 4/5   Left Knee Extension 5/5   6 minute walk test results    Endurance additional comments 1158 ft   High Level Balance   High Level Balance Comments SLS R 29", L 12"                               PT Short Term Goals - 03/06/15 0945    PT SHORT TERM GOAL #1   Title Patient to be able to verbally report the importance of maintaining good posture through all functional tasks, and will be able to maintain correct posture during all functional activities    Time 4   Period Weeks   Status Achieved   PT SHORT TERM GOAL #2   Title Patient to demonstrate bilateral hip ER of at least 50 degrees, bilateral hip IR of at least 35 degrees in order to enhance overall mobility and mechanics    Time 4   Period Weeks   Status On-going   PT SHORT TERM GOAL #3   Title Patient to report that she is experiencing no more than 2/10 pain in her low back with all functional weightbearing tasks of at least 60 minutes in duration    Time 4   Period Weeks   Status Achieved   PT SHORT TERM GOAL #4   Title Patient will be independent in correctly and consistently performing appropriate HEP, to be updated PRN    Time 4   Period Weeks   Status Achieved           PT Long Term Goals - 03/06/15 1007    PT LONG TERM GOAL #1   Title Patient will demonstrate 5/5 strength in bilateral lower extremities, at least 4/5 strength in proximal musculature, and at least 4/5 strength in general core    Time 8   Period Weeks   Status On-going   PT LONG TERM GOAL #2   Title Patient will demonstrate the ablity to ambulate at least 1479ft during 6 minute walk test with no rest breaks, at a gait speed of at least 1.56m/s    Time 8   Period Weeks   Status On-going   PT LONG TERM GOAL #3   Title Patient will demonstrate the ability to maintain SLS for at least 45 seconds each lower extremity with no  HHA    Time 8   Period Weeks   Status On-going   PT LONG TERM GOAL #4   Title Patient will demonstrate an increase in overall FACIT-F score to at least 130 to indicate improved quality of life    Time 8   Period Weeks   Status Achieved               Plan - March 26, 2015 1157    Clinical Impression Statement Reassessment completed today. Pt demonstrates improvements in BLE strength, hip ROM, gait mechanics, balance, and functional activity tolerance. She continues to demonstrate decreased gait speed during 6MWT, but was able to complete the entire test without stopping for a rest break, which is an improvement from initial evaluation. Pt will benefit from continued skilled services to further address her balance, gait speed, strength, and pain in her R posterior hip musculature.    PT Next Visit Plan Continue with balance and strength training, manual to R posterior hip/gluts if increased pain in the area          G-Codes - 03-26-15 1149    Functional Assessment Tool Used Based on skilled clinical assessment of strength, ROM, gait, posture, balance, functional activity tolerance    Functional Limitation Mobility: Walking and moving around   Mobility: Walking and Moving Around Current Status (C7893) At least 20 percent but less than 40 percent impaired, limited or restricted   Mobility: Walking and Moving Around Goal Status [phone]) At least 20 percent but less than 40 percent impaired, limited or restricted      Problem List Patient Active Problem List   Diagnosis Date Noted  . Abnormal ECG   . DOE (dyspnea on exertion) 04/12/2014  . Annual physical exam 09/08/2013  . Screen for colon cancer 04/22/2011  . MIGRAINE HEADACHE 01/23/2009  . GERD 01/23/2009  . GASTRITIS 01/23/2009  . LOOSE STOOLS  01/23/2009  . ALLERGY 01/23/2009  . UTERINE CANCER, HX OF 01/23/2009    Physical Therapy Progress Note  Dates of Reporting Period: 02/06/15 to 03-26-15  Objective Reports of Subjective Statement: Pt has demonstrated improvements in BLE strength, functional activity tolerance, and balance. SLS has improved to 29" on RLE and 12" on LLE.  Objective Measurements: see above  Goal Update: see above  Plan: Continue to address pt's impairments in balance, BLE strength, and functional activity tolerance. She will benefit from 6 more sessions to continue with skilled therapy, with focus on balance and strength training, as well as manual therapy to decrease pain in posterior hip.   Reason Skilled Services are Required: Pt continues to demonstrate decreased balance as evidenced by SLS times, decreased strength per MMT, and decreased functional activity tolerance evidenced by 6MWT time. Pt will benefit from skilled PT to address these issues in order to return pt to PLOF.    Hilma Favors, PT, DPT [phone] 03/26/2015, 12:01 PM  [place] [address] [place], [name], [address] Phone: [phone]   Fax:  [phone]

## 2015-03-07 DIAGNOSIS — M65341 Trigger finger, right ring finger: Secondary | ICD-10-CM | POA: Diagnosis not present

## 2015-03-07 DIAGNOSIS — M1811 Unilateral primary osteoarthritis of first carpometacarpal joint, right hand: Secondary | ICD-10-CM | POA: Diagnosis not present

## 2015-03-08 ENCOUNTER — Ambulatory Visit (HOSPITAL_COMMUNITY): Payer: Medicare Other

## 2015-03-08 DIAGNOSIS — M6281 Muscle weakness (generalized): Secondary | ICD-10-CM

## 2015-03-08 DIAGNOSIS — M25651 Stiffness of right hip, not elsewhere classified: Secondary | ICD-10-CM | POA: Diagnosis not present

## 2015-03-08 DIAGNOSIS — R198 Other specified symptoms and signs involving the digestive system and abdomen: Secondary | ICD-10-CM

## 2015-03-08 DIAGNOSIS — M25652 Stiffness of left hip, not elsewhere classified: Secondary | ICD-10-CM | POA: Diagnosis not present

## 2015-03-08 DIAGNOSIS — R262 Difficulty in walking, not elsewhere classified: Secondary | ICD-10-CM

## 2015-03-08 DIAGNOSIS — R29898 Other symptoms and signs involving the musculoskeletal system: Secondary | ICD-10-CM

## 2015-03-08 DIAGNOSIS — M6289 Other specified disorders of muscle: Secondary | ICD-10-CM | POA: Diagnosis not present

## 2015-03-08 DIAGNOSIS — R6889 Other general symptoms and signs: Secondary | ICD-10-CM

## 2015-03-08 NOTE — Therapy (Signed)
Kayla Thomas, [name], [address] Phone: [phone]   Fax:  [phone]  Physical Therapy Treatment  Patient Details  Name: Kayla Thomas MRN: [id number] Date of Birth: 1941/01/30 Referring Provider:  Patrici Ranks, MD  Encounter Date: 03/08/2015      PT End of Session - 03/08/15 1127    Visit Number 10   Number of Visits 16   Date for PT Re-Evaluation 04/05/15   Authorization Type Medicare    Authorization Time Period 02/06/15 to 04/08/15- gcodes done 9th visit   Authorization - Visit Number 10   Authorization - Number of Visits 19   PT Start Time 0930   PT Stop Time 1010   PT Time Calculation (min) 40 min   Activity Tolerance Patient tolerated treatment well;Patient limited by pain   Behavior During Therapy [facility] for tasks assessed/performed      Past Medical History  Diagnosis Date  . Uterine cancer 1998  . Chronic back pain   . GERD (gastroesophageal reflux disease) 01/21/2006    EGD Dr Oneida Alar mild chronic gastritis, (NO h pylori) otherwise normal  . Sinus congestion   . Headache(784.0)   . Breast cancer   . Thumb tendonitis September 2014    left  . Breast cancer   . Wears partial dentures   . Gastric ulcer     History    Past Surgical History  Procedure Laterality Date  . Cholecystectomy  11/1999  . S/p hysterectomy  1998    uterine ca  . Temperal/mandibular  1974  . Esophagogastroduodenoscopy  01/21/06    mild antral erythema bx h-pylori/normal esophagus without evidence of mass or Barrett's/normal pylorus and duodenum  . Cataracts    . Colonoscopy  05/13/2011    Procedure: COLONOSCOPY;  Surgeon: Dorothyann Peng, MD;  [facility];  Service: Endoscopy;  Laterality: N/A;  8:30  . Tonsillectomy  1964  . Abscess drainage  03-04-05    insect bite  . Cataract extraction w/phaco  10/06/2011    Procedure: CATARACT EXTRACTION PHACO AND INTRAOCULAR LENS PLACEMENT (IOC);  Surgeon:  Williams Che, MD;  [facility];  Service: Ophthalmology;  Laterality: Left;  CDE:  5.76  . Abdominal hysterectomy    . Partial mastectomy with needle localization and axillary sentinel lymph node bx Left 08/18/2012    Procedure: PARTIAL MASTECTOMY WITH NEEDLE LOCALIZATION AND AXILLARY SENTINEL LYMPH NODE BX;  Surgeon: Jamesetta So, MD;  [facility];  Service: General;  Laterality: Left;  Need Frozen Section/Sentinel Node Bx @ 8:00am/Needle Loc @ 9:00am  . Trigger finger release Right 05/04/2014    Procedure: RELEASE TRIGGER FINGER/A-1 PULLEY RIGHT RING FINGER;  Surgeon: Daryll Brod, MD;  Location: [place];  Service: Orthopedics;  Laterality: Right;  . Cardiac catheterization N/A 10/31/2014    Procedure: Right/Left Heart Cath and Coronary Angiography;  Surgeon: Troy Sine, MD;  Location: [place] [facility];  Service: Cardiovascular;  Laterality: N/A;    There were no vitals filed for this visit.  Visit Diagnosis:  Weakness of both lower extremities  Proximal muscle weakness  Difficulty walking  Abdominal weakness  Stiffness of hip joint, right  Hip joint stiffness, left  Decreased functional activity tolerance      Subjective Assessment - 03/08/15 1120    Subjective Pt arrived limping today, saying that she has been especailyl sore in the R posterior hip, same area as before, but significantly moreso  over the past 2 days. Other than that, pt has been trying to use her new pedometer and keep track fo her steps as she progresses her ambulation.    Pertinent History Patient has had low back surgery in the past-  four screws and a plate. History of uterine cancer. Was diagnosed with L breast cancer in 2014, had been taking oral cancer medications as well as radiation. No longer on radiation, just doing oral medicines.  Still doing water exercise at [facility] 3x/week, a little tired after but mostly OK afterwards.    Patient Stated Goals be able to walk without  pain    Currently in Pain? Yes   Pain Location Hip   Pain Orientation Right   Pain Descriptors / Indicators Tightness   Pain Type Chronic pain                         [facility] Adult PT Treatment/Exercise - 03/08/15 0001    Therapeutic Activites    Therapeutic Activities Work Simulation  Standing trunk rotation c blue med ball taps, 1x20 bilat   Knee/Hip Exercises: Stretches   Piriformis Stretch 3 reps;30 seconds   Other Knee/Hip Stretches Supine R knee to opposite shoulder  3x30 sec bilat   Knee/Hip Exercises: Standing   Rebounder 2x10 bilat tandem/firm; 1x20 narrow/foam   Modalities   Modalities Moist Heat   Moist Heat Therapy   Number Minutes Moist Heat 10 Minutes  no charge   Moist Heat Location Other (comment)  R post hip   Manual Therapy   Manual therapy comments Myofascial release R posterior glute med, proximal glute max  10 minutes                PT Education - 03/08/15 1126    Education provided Yes   Education Details Application of heat to posterior glute max, given stretch for R glute max    Person(s) Educated Patient   Methods Explanation;Demonstration;Tactile cues   Comprehension Verbalized understanding;Returned demonstration          PT Short Term Goals - 03/06/15 0945    PT SHORT TERM GOAL #1   Title Patient to be able to verbally report the importance of maintaining good posture through all functional tasks, and will be able to maintain correct posture during all functional activities    Time 4   Period Weeks   Status Achieved   PT SHORT TERM GOAL #2   Title Patient to demonstrate bilateral hip ER of at least 50 degrees, bilateral hip IR of at least 35 degrees in order to enhance overall mobility and mechanics    Time 4   Period Weeks   Status On-going   PT SHORT TERM GOAL #3   Title Patient to report that she is experiencing no more than 2/10 pain in her low back with all functional weightbearing tasks of at least 60  minutes in duration    Time 4   Period Weeks   Status Achieved   PT SHORT TERM GOAL #4   Title Patient will be independent in correctly and consistently performing appropriate HEP, to be updated PRN    Time 4   Period Weeks   Status Achieved           PT Long Term Goals - 03/06/15 1007    PT LONG TERM GOAL #1   Title Patient will demonstrate 5/5 strength in bilateral lower extremities, at least 4/5 strength in proximal musculature,  and at least 4/5 strength in general core    Time 8   Period Weeks   Status On-going   PT LONG TERM GOAL #2   Title Patient will demonstrate the ablity to ambulate at least 143ft during 6 minute walk test with no rest breaks, at a gait speed of at least 1.37m/s   Time 8   Period Weeks   Status On-going   PT LONG TERM GOAL #3   Title Patient will demonstrate the ability to maintain SLS for at least 45 seconds each lower extremity with no HHA    Time 8   Period Weeks   Status On-going   PT LONG TERM GOAL #4   Title Patient will demonstrate an increase in overall FACIT-F score to at least 130 to indicate improved quality of life    Time 8   Period Weeks   Status Achieved               Plan - 03/08/15 1128    Clinical Impression Statement Pt presenting with acute on chronic flare up of pain in R posterior hip, limiting tolerance to treatment session today. PT focused on manual therapy to release R glute max, application of moist heat for pain control and improved muscle relaxation, and teaching self care at home related to pain. Pt was able to perform stretches and balance activiies and continues to demonstrate progress in that area. Pt was unable to tolerate typical strengthening today as related to muscle spasm/pain in right hip. Will continue full POC next session as appropriate.    Pt will benefit from skilled therapeutic intervention in order to improve on the following deficits Abnormal gait;Decreased endurance;Hypomobility;Decreased  activity tolerance;Decreased strength;Pain;Decreased balance;Decreased mobility;Difficulty walking;Decreased coordination;Decreased safety awareness;Postural dysfunction   Rehab Potential Good   PT Frequency 2x / week   PT Duration 8 weeks   PT Treatment/Interventions ADLs/Self Care Home Management;Gait training;Stair training;Functional mobility training;Therapeutic activities;Therapeutic exercise;Balance training;Neuromuscular re-education;Patient/family education;Manual techniques;Energy conservation   PT Next Visit Plan Continue with balance and strength training, manual to R posterior hip/gluts if increased pain in the area   PT Home Exercise Plan No changes   Consulted and Agree with Plan of Care Patient        Problem List Patient Active Problem List   Diagnosis Date Noted  . Abnormal ECG   . DOE (dyspnea on exertion) 04/12/2014  . Annual physical exam 09/08/2013  . Screen for colon cancer 04/22/2011  . MIGRAINE HEADACHE 01/23/2009  . GERD 01/23/2009  . GASTRITIS 01/23/2009  . LOOSE STOOLS 01/23/2009  . ALLERGY 01/23/2009  . UTERINE CANCER, HX OF 01/23/2009    Buccola,Allan C 03/08/2015, 11:35 AM  11:35 AM  Etta Grandchild, PT, DPT [place] License # [id number]       [facility] [address] [place], [name], [address] Phone: [phone]   Fax:  [phone]

## 2015-03-08 NOTE — Patient Instructions (Signed)
Right Knee to Chest (opposite shoulder)   Lying supine, bend involved knee to chest 3 times for 30 seconds. Repeat with other leg. Do 2 times per day.  Copyright  VHI. All rights reserved.

## 2015-03-13 ENCOUNTER — Ambulatory Visit (HOSPITAL_COMMUNITY): Payer: Medicare Other | Admitting: Physical Therapy

## 2015-03-13 DIAGNOSIS — M25652 Stiffness of left hip, not elsewhere classified: Secondary | ICD-10-CM | POA: Diagnosis not present

## 2015-03-13 DIAGNOSIS — M25651 Stiffness of right hip, not elsewhere classified: Secondary | ICD-10-CM

## 2015-03-13 DIAGNOSIS — R198 Other specified symptoms and signs involving the digestive system and abdomen: Secondary | ICD-10-CM | POA: Diagnosis not present

## 2015-03-13 DIAGNOSIS — M6281 Muscle weakness (generalized): Secondary | ICD-10-CM

## 2015-03-13 DIAGNOSIS — M6289 Other specified disorders of muscle: Secondary | ICD-10-CM | POA: Diagnosis not present

## 2015-03-13 DIAGNOSIS — R262 Difficulty in walking, not elsewhere classified: Secondary | ICD-10-CM

## 2015-03-13 DIAGNOSIS — R29898 Other symptoms and signs involving the musculoskeletal system: Secondary | ICD-10-CM | POA: Diagnosis not present

## 2015-03-13 NOTE — Therapy (Signed)
[facility] [address] [place], [place], [address] Phone: [phone]   Fax:  [phone]  Physical Therapy Treatment  Patient Details  Name: Kayla Thomas MRN: [id number] Date of Birth: May 16, 1941 Referring Huma Imhoff:  Allene Pyo, MD  Encounter Date: 03/13/2015      PT End of Session - 03/13/15 1153    Visit Number 11   Number of Visits 16   Date for PT Re-Evaluation 04/05/15   Authorization Type Medicare    Authorization Time Period 02/06/15 to 04/08/15- gcodes done 9th visit   Authorization - Visit Number 11   Authorization - Number of Visits 19   PT Start Time 0931   PT Stop Time 1018   PT Time Calculation (min) 47 min   Equipment Utilized During Treatment Gait belt   Activity Tolerance Patient tolerated treatment well   Behavior During Therapy [facility] for tasks assessed/performed      Past Medical History  Diagnosis Date  . Uterine cancer 1998  . Chronic back pain   . GERD (gastroesophageal reflux disease) 01/21/2006    EGD Dr Darrick Penna mild chronic gastritis, (NO h pylori) otherwise normal  . Sinus congestion   . Headache(784.0)   . Breast cancer   . Thumb tendonitis September 2014    left  . Breast cancer   . Wears partial dentures   . Gastric ulcer     History    Past Surgical History  Procedure Laterality Date  . Cholecystectomy  11/1999  . S/p hysterectomy  1998    uterine ca  . Temperal/mandibular  1974  . Esophagogastroduodenoscopy  01/21/06    mild antral erythema bx h-pylori/normal esophagus without evidence of mass or Barrett's/normal pylorus and duodenum  . Cataracts    . Colonoscopy  05/13/2011    Procedure: COLONOSCOPY;  Surgeon: Arlyce Harman, MD;  [facility];  Service: Endoscopy;  Laterality: N/A;  8:30  . Tonsillectomy  1964  . Abscess drainage  03-04-05    insect bite  . Cataract extraction w/phaco  10/06/2011    Procedure: CATARACT EXTRACTION PHACO AND INTRAOCULAR LENS  PLACEMENT (IOC);  Surgeon: Susa Simmonds, MD;  Location: [facility];  Service: Ophthalmology;  Laterality: Left;  CDE:  5.76  . Abdominal hysterectomy    . Partial mastectomy with needle localization and axillary sentinel lymph node bx Left 08/18/2012    Procedure: PARTIAL MASTECTOMY WITH NEEDLE LOCALIZATION AND AXILLARY SENTINEL LYMPH NODE BX;  Surgeon: [name], MD;  Location: [facility];  Service: General;  Laterality: Left;  Need Frozen Section/Sentinel Node Bx @ 8:00am/Needle Loc @ 9:00am  . Trigger finger release Right 05/04/2014    Procedure: RELEASE TRIGGER FINGER/A-1 PULLEY RIGHT RING FINGER;  Surgeon: Cindee Salt, MD;  Location: [facility];  Service: Orthopedics;  Laterality: Right;  . Cardiac catheterization N/A 10/31/2014    Procedure: Right/Left Heart Cath and Coronary Angiography;  Surgeon: Lennette Bihari, MD;  [facility];  Service: Cardiovascular;  Laterality: N/A;    There were no vitals filed for this visit.  Visit Diagnosis:  Weakness of both lower extremities  Proximal muscle weakness  Difficulty walking  Stiffness of hip joint, right      Subjective Assessment - 03/13/15 0938    Subjective Pt reports that her leg feels much better today than it did last session. She feels that the heat and the manual therapy to her posterior hip really helped her last  time.    Currently in Pain? Yes   Pain Score 1                          [facility] Adult PT Treatment/Exercise - 03/13/15 0001    Knee/Hip Exercises: Stretches   Other Knee/Hip Stretches Supine R knee to opposite shoulder  3x30 sec bilat   Knee/Hip Exercises: Aerobic   Nustep hills 3 Level 3 x 10'  warm up , no charge, 5 min   Knee/Hip Exercises: Standing   Lateral Step Up 15 reps;Step Height: 8"  6" plus airex (8" total)   Forward Step Up 15 reps;Step Height: 8"  6" + airex (8")   Functional Squat 15 reps  21" mat table   Knee/Hip Exercises: Supine   Bridges  Limitations 15   Moist Heat Therapy   Number Minutes Moist Heat 8 Minutes   Moist Heat Location Other (comment)  R post hip   Manual Therapy   Manual therapy comments Myofascial release R posterior glute med, proximal glute max  15 minutes                  PT Short Term Goals - 03/06/15 0945    PT SHORT TERM GOAL #1   Title Patient to be able to verbally report the importance of maintaining good posture through all functional tasks, and will be able to maintain correct posture during all functional activities    Time 4   Period Weeks   Status Achieved   PT SHORT TERM GOAL #2   Title Patient to demonstrate bilateral hip ER of at least 50 degrees, bilateral hip IR of at least 35 degrees in order to enhance overall mobility and mechanics    Time 4   Period Weeks   Status On-going   PT SHORT TERM GOAL #3   Title Patient to report that she is experiencing no more than 2/10 pain in her low back with all functional weightbearing tasks of at least 60 minutes in duration    Time 4   Period Weeks   Status Achieved   PT SHORT TERM GOAL #4   Title Patient will be independent in correctly and consistently performing appropriate HEP, to be updated PRN    Time 4   Period Weeks   Status Achieved           PT Long Term Goals - 03/06/15 1007    PT LONG TERM GOAL #1   Title Patient will demonstrate 5/5 strength in bilateral lower extremities, at least 4/5 strength in proximal musculature, and at least 4/5 strength in general core    Time 8   Period Weeks   Status On-going   PT LONG TERM GOAL #2   Title Patient will demonstrate the ablity to ambulate at least 14108ft during 6 minute walk test with no rest breaks, at a gait speed of at least 1.34m/s   Time 8   Period Weeks   Status On-going   PT LONG TERM GOAL #3   Title Patient will demonstrate the ability to maintain SLS for at least 45 seconds each lower extremity with no HHA    Time 8   Period Weeks   Status On-going   PT  LONG TERM GOAL #4   Title Patient will demonstrate an increase in overall FACIT-F score to at least 130 to indicate improved quality of life    Time 8   Period Weeks  Status Achieved               Plan - 03/13/15 1154    Clinical Impression Statement Treatment session began with MHP and manual therapy to R posterior hip to decrease pain in gluteal region. Pt demonstrated increased tightness and tenderness in R glut max, glut med, and proximal hamstrings that improved following manual therapy. Treatment session focused on glut stretching and strengthening to improve gait mechanics and decrease gluteal pain.    PT Next Visit Plan Continue with balance and strength training, manual to R posterior hip/gluts if increased pain in the area        Problem List Patient Active Problem List   Diagnosis Date Noted  . Abnormal ECG   . DOE (dyspnea on exertion) 04/12/2014  . Annual physical exam 09/08/2013  . Screen for colon cancer 04/22/2011  . MIGRAINE HEADACHE 01/23/2009  . GERD 01/23/2009  . GASTRITIS 01/23/2009  . LOOSE STOOLS 01/23/2009  . ALLERGY 01/23/2009  . UTERINE CANCER, HX OF 01/23/2009    Leona Singleton, PT, DPT [phone] 03/13/2015, 12:04 PM  [facility] [address] [place], [place], [address] Phone: [phone]   Fax:  [phone]

## 2015-03-15 ENCOUNTER — Ambulatory Visit (HOSPITAL_COMMUNITY): Payer: Medicare Other | Admitting: Physical Therapy

## 2015-03-15 DIAGNOSIS — M6289 Other specified disorders of muscle: Secondary | ICD-10-CM | POA: Diagnosis not present

## 2015-03-15 DIAGNOSIS — R262 Difficulty in walking, not elsewhere classified: Secondary | ICD-10-CM

## 2015-03-15 DIAGNOSIS — M25652 Stiffness of left hip, not elsewhere classified: Secondary | ICD-10-CM | POA: Diagnosis not present

## 2015-03-15 DIAGNOSIS — R198 Other specified symptoms and signs involving the digestive system and abdomen: Secondary | ICD-10-CM | POA: Diagnosis not present

## 2015-03-15 DIAGNOSIS — M25651 Stiffness of right hip, not elsewhere classified: Secondary | ICD-10-CM | POA: Diagnosis not present

## 2015-03-15 DIAGNOSIS — R29898 Other symptoms and signs involving the musculoskeletal system: Secondary | ICD-10-CM

## 2015-03-15 DIAGNOSIS — M6281 Muscle weakness (generalized): Secondary | ICD-10-CM

## 2015-03-15 NOTE — Therapy (Signed)
[place], [name], [address] Phone: [phone]   Fax:  [phone]  Physical Therapy Treatment  Patient Details  Name: Kayla Thomas MRN: [id number] Date of Birth: 09-May-1941 Referring Provider:  Asencion Noble, MD  Encounter Date: 03/15/2015      PT End of Session - 03/15/15 1021    Visit Number 12   Number of Visits 16   Date for PT Re-Evaluation 04/05/15   Authorization Type Medicare    Authorization Time Period 02/06/15 to 04/08/15- gcodes done 9th visit   Authorization - Visit Number 12   Authorization - Number of Visits 19      Past Medical History  Diagnosis Date  . Uterine cancer 1998  . Chronic back pain   . GERD (gastroesophageal reflux disease) 01/21/2006    EGD Dr Oneida Alar mild chronic gastritis, (NO h pylori) otherwise normal  . Sinus congestion   . Headache(784.0)   . Breast cancer   . Thumb tendonitis September 2014    left  . Breast cancer   . Wears partial dentures   . Gastric ulcer     History    Past Surgical History  Procedure Laterality Date  . Cholecystectomy  11/1999  . S/p hysterectomy  1998    uterine ca  . Temperal/mandibular  1974  . Esophagogastroduodenoscopy  01/21/06    mild antral erythema bx h-pylori/normal esophagus without evidence of mass or Barrett's/normal pylorus and duodenum  . Cataracts    . Colonoscopy  05/13/2011    Procedure: COLONOSCOPY;  Surgeon: Dorothyann Peng, MD;  [facility];  Service: Endoscopy;  Laterality: N/A;  8:30  . Tonsillectomy  1964  . Abscess drainage  03-04-05    insect bite  . Cataract extraction w/phaco  10/06/2011    Procedure: CATARACT EXTRACTION PHACO AND INTRAOCULAR LENS PLACEMENT (IOC);  Surgeon: Williams Che, MD;  Location: [facility];  Service: Ophthalmology;  Laterality: Left;  CDE:  5.76  . Abdominal hysterectomy    . Partial mastectomy with needle localization and axillary sentinel lymph node bx Left 08/18/2012     Procedure: PARTIAL MASTECTOMY WITH NEEDLE LOCALIZATION AND AXILLARY SENTINEL LYMPH NODE BX;  Surgeon: Jamesetta So, MD;  Location: [facility];  Service: General;  Laterality: Left;  Need Frozen Section/Sentinel Node Bx @ 8:00am/Needle Loc @ 9:00am  . Trigger finger release Right 05/04/2014    Procedure: RELEASE TRIGGER FINGER/A-1 PULLEY RIGHT RING FINGER;  Surgeon: Daryll Brod, MD;  Location: [facility];  Service: Orthopedics;  Laterality: Right;  . Cardiac catheterization N/A 10/31/2014    Procedure: Right/Left Heart Cath and Coronary Angiography;  Surgeon: Troy Sine, MD;  Location: [facility];  Service: Cardiovascular;  Laterality: N/A;    There were no vitals filed for this visit.  Visit Diagnosis:  Weakness of both lower extremities  Proximal muscle weakness  Difficulty walking  Stiffness of hip joint, right      Subjective Assessment - 03/15/15 0937    Subjective Pt reports that she is having some increased pain in her R hip today, she isn't sure if it's because of the rainy weather.    Currently in Pain? Yes   Pain Score 2    Pain Location Hip   Pain Orientation Right                         [facility] Adult PT Treatment/Exercise - 03/15/15  0001    Knee/Hip Exercises: Stretches   Other Knee/Hip Stretches Supine R knee to opposite shoulder  3x30 sec bilat   Knee/Hip Exercises: Aerobic   Nustep hills 3 Level 3 x 10'  warm up , no charge, 5 min   Knee/Hip Exercises: Standing   Lateral Step Up 15 reps;Step Height: 8"  6" plus airex (8" total)   Forward Step Up 15 reps;Step Height: 8"  6" + airex (8")   Other Standing Knee Exercises sidestepping with green tband around ankles x2 RT   Other Standing Knee Exercises tap ups forward and laterally on airex at 6" step   Knee/Hip Exercises: Supine   Bridges Limitations 15   Modalities   Modalities Moist Heat   Moist Heat Therapy   Number Minutes Moist Heat 8 Minutes   Moist Heat  Location Other (comment)  R post hip   Manual Therapy   Manual Therapy Myofascial release   Myofascial Release R posterior glut med, glut max, proximal hamstrings                  PT Short Term Goals - 03/06/15 0945    PT SHORT TERM GOAL #1   Title Patient to be able to verbally report the importance of maintaining good posture through all functional tasks, and will be able to maintain correct posture during all functional activities    Time 4   Period Weeks   Status Achieved   PT SHORT TERM GOAL #2   Title Patient to demonstrate bilateral hip ER of at least 50 degrees, bilateral hip IR of at least 35 degrees in order to enhance overall mobility and mechanics    Time 4   Period Weeks   Status On-going   PT SHORT TERM GOAL #3   Title Patient to report that she is experiencing no more than 2/10 pain in her low back with all functional weightbearing tasks of at least 60 minutes in duration    Time 4   Period Weeks   Status Achieved   PT SHORT TERM GOAL #4   Title Patient will be independent in correctly and consistently performing appropriate HEP, to be updated PRN    Time 4   Period Weeks   Status Achieved           PT Long Term Goals - 03/06/15 1007    PT LONG TERM GOAL #1   Title Patient will demonstrate 5/5 strength in bilateral lower extremities, at least 4/5 strength in proximal musculature, and at least 4/5 strength in general core    Time 8   Period Weeks   Status On-going   PT LONG TERM GOAL #2   Title Patient will demonstrate the ablity to ambulate at least 1437ft during 6 minute walk test with no rest breaks, at a gait speed of at least 1.65m/s   Time 8   Period Weeks   Status On-going   PT LONG TERM GOAL #3   Title Patient will demonstrate the ability to maintain SLS for at least 45 seconds each lower extremity with no HHA    Time 8   Period Weeks   Status On-going   PT LONG TERM GOAL #4   Title Patient will demonstrate an increase in overall  FACIT-F score to at least 130 to indicate improved quality of life    Time 8   Period Weeks   Status Achieved  Plan - 03/15/15 1157    Clinical Impression Statement MHP was applied to R posterior hip, followed by manual therapy prior to functional strengthening today. Pt had increased tightness and tenderness in R glut max and piriformis today that decreased following manual therapy. Functional strengthening focused on glut and quad strengthening and hip stabilization exercises. Pt denied any pain with therex today.    PT Next Visit Plan Continue with balance and strength training, manual to R posterior hip/gluts if increased pain in the area        Problem List Patient Active Problem List   Diagnosis Date Noted  . Abnormal ECG   . DOE (dyspnea on exertion) 04/12/2014  . Annual physical exam 09/08/2013  . Screen for colon cancer 04/22/2011  . MIGRAINE HEADACHE 01/23/2009  . GERD 01/23/2009  . GASTRITIS 01/23/2009  . LOOSE STOOLS 01/23/2009  . ALLERGY 01/23/2009  . UTERINE CANCER, HX OF 01/23/2009    Hilma Favors, PT, DPT [phone] 03/15/2015, 12:09 PM  [facility] [address] [place], [name], [address] Phone: [phone]   Fax:  [phone]

## 2015-03-20 ENCOUNTER — Ambulatory Visit (HOSPITAL_COMMUNITY): Payer: Medicare Other | Admitting: Physical Therapy

## 2015-03-20 DIAGNOSIS — R6889 Other general symptoms and signs: Secondary | ICD-10-CM

## 2015-03-20 DIAGNOSIS — M6281 Muscle weakness (generalized): Secondary | ICD-10-CM

## 2015-03-20 DIAGNOSIS — Z853 Personal history of malignant neoplasm of breast: Secondary | ICD-10-CM

## 2015-03-20 DIAGNOSIS — R198 Other specified symptoms and signs involving the digestive system and abdomen: Secondary | ICD-10-CM

## 2015-03-20 DIAGNOSIS — R262 Difficulty in walking, not elsewhere classified: Secondary | ICD-10-CM

## 2015-03-20 DIAGNOSIS — R29898 Other symptoms and signs involving the musculoskeletal system: Secondary | ICD-10-CM

## 2015-03-20 DIAGNOSIS — M6289 Other specified disorders of muscle: Secondary | ICD-10-CM | POA: Diagnosis not present

## 2015-03-20 DIAGNOSIS — M25651 Stiffness of right hip, not elsewhere classified: Secondary | ICD-10-CM

## 2015-03-20 DIAGNOSIS — M25652 Stiffness of left hip, not elsewhere classified: Secondary | ICD-10-CM | POA: Diagnosis not present

## 2015-03-20 NOTE — Therapy (Signed)
[place], [name], [address] Phone: [phone]   Fax:  [phone]  Physical Therapy Treatment  Patient Details  Name: Kayla Thomas MRN: [id number] Date of Birth: 08/20/1940 Referring Provider:  Patrici Ranks, MD  Encounter Date: 03/20/2015      PT End of Session - 03/20/15 1017    Visit Number 13   Number of Visits 16   Date for PT Re-Evaluation 03/22/15   Authorization Type Medicare    Authorization Time Period 02/06/15 to 04/08/15- gcodes done 9th visit   Authorization - Visit Number 13   Authorization - Number of Visits 19   PT Start Time 0932   PT Stop Time 1013   PT Time Calculation (min) 41 min   Activity Tolerance Patient tolerated treatment well   Behavior During Therapy [facility] for tasks assessed/performed      Past Medical History  Diagnosis Date  . Uterine cancer 1998  . Chronic back pain   . GERD (gastroesophageal reflux disease) 01/21/2006    EGD Dr Oneida Alar mild chronic gastritis, (NO h pylori) otherwise normal  . Sinus congestion   . Headache(784.0)   . Breast cancer   . Thumb tendonitis September 2014    left  . Breast cancer   . Wears partial dentures   . Gastric ulcer     History    Past Surgical History  Procedure Laterality Date  . Cholecystectomy  11/1999  . S/p hysterectomy  1998    uterine ca  . Temperal/mandibular  1974  . Esophagogastroduodenoscopy  01/21/06    mild antral erythema bx h-pylori/normal esophagus without evidence of mass or Barrett's/normal pylorus and duodenum  . Cataracts    . Colonoscopy  05/13/2011    Procedure: COLONOSCOPY;  Surgeon: Dorothyann Peng, MD;  [facility];  Service: Endoscopy;  Laterality: N/A;  8:30  . Tonsillectomy  1964  . Abscess drainage  03-04-05    insect bite  . Cataract extraction w/phaco  10/06/2011    Procedure: CATARACT EXTRACTION PHACO AND INTRAOCULAR LENS PLACEMENT (IOC);  Surgeon: Williams Che, MD;   Location: [facility];  Service: Ophthalmology;  Laterality: Left;  CDE:  5.76  . Abdominal hysterectomy    . Partial mastectomy with needle localization and axillary sentinel lymph node bx Left 08/18/2012    Procedure: PARTIAL MASTECTOMY WITH NEEDLE LOCALIZATION AND AXILLARY SENTINEL LYMPH NODE BX;  Surgeon: Jamesetta So, MD;  Location: [facility];  Service: General;  Laterality: Left;  Need Frozen Section/Sentinel Node Bx @ 8:00am/Needle Loc @ 9:00am  . Trigger finger release Right 05/04/2014    Procedure: RELEASE TRIGGER FINGER/A-1 PULLEY RIGHT RING FINGER;  Surgeon: Daryll Brod, MD;  Location: [facility];  Service: Orthopedics;  Laterality: Right;  . Cardiac catheterization N/A 10/31/2014    Procedure: Right/Left Heart Cath and Coronary Angiography;  Surgeon: Troy Sine, MD;  Location: [facility];  Service: Cardiovascular;  Laterality: N/A;    There were no vitals filed for this visit.  Visit Diagnosis:  Weakness of both lower extremities  Proximal muscle weakness  Difficulty walking  Stiffness of hip joint, right  Abdominal weakness  Hip joint stiffness, left  Decreased functional activity tolerance  History of breast cancer      Subjective Assessment - 03/20/15 0933    Subjective Patient reports she is doing well today, has a little pain when she first gets going in the mornings but not  right now   Pertinent History Patient has had low back surgery in the past-  four screws and a plate. History of uterine cancer. Was diagnosed with L breast cancer in 2014, had been taking oral cancer medications as well as radiation. No longer on radiation, just doing oral medicines.  Still doing water exercise at [facility] 3x/week, a little tired after but mostly OK afterwards.    Currently in Pain? No/denies                         [facility] Adult PT Treatment/Exercise - 03/20/15 0001    Knee/Hip Exercises: Stretches   Active Hamstring Stretch 3 reps;30 seconds    Active Hamstring Stretch Limitations 12" step   Piriformis Stretch 3 reps;30 seconds   Piriformis Stretch Limitations seated    Gastroc Stretch 3 reps;30 seconds   Gastroc Stretch Limitations slantboard    Knee/Hip Exercises: Standing   Heel Raises Both;1 set;15 reps   Heel Raises Limitations unilateral    Forward Lunges Both;1 set;10 reps   Forward Lunges Limitations 4 inch box    Side Lunges Both;1 set;10 reps   Side Lunges Limitations 4 inch box    Functional Squat 15 reps   Other Standing Knee Exercises 3D hip excursions 1x10   Other Standing Knee Exercises Hip ABD walks 2x63ft; sit to stand staggered stance 1x5             Balance Exercises - 03/20/15 0936    Balance Exercises: Standing   Standing Eyes Closed Narrow base of support (BOS);Foam/compliant surface;3 reps;10 secs   Tandem Stance Eyes closed;Foam/compliant surface;3 reps;15 secs   SLS Eyes open;Solid surface;3 reps   Wall Bumps Shoulder   Wall Bumps-Shoulders 20 reps;Eyes closed;Anterior/posterior   Rockerboard Other (comment);Anterior/posterior;Lateral  x20, no HHA    Tandem Gait 4 reps;Other (comment)  12ft   Other Standing Exercises --           PT Education - 03/20/15 1016    Education provided No          PT Short Term Goals - 03/06/15 0945    PT SHORT TERM GOAL #1   Title Patient to be able to verbally report the importance of maintaining good posture through all functional tasks, and will be able to maintain correct posture during all functional activities    Time 4   Period Weeks   Status Achieved   PT SHORT TERM GOAL #2   Title Patient to demonstrate bilateral hip ER of at least 50 degrees, bilateral hip IR of at least 35 degrees in order to enhance overall mobility and mechanics    Time 4   Period Weeks   Status On-going   PT SHORT TERM GOAL #3   Title Patient to report that she is experiencing no more than 2/10 pain in her low back with all functional weightbearing tasks of at  least 60 minutes in duration    Time 4   Period Weeks   Status Achieved   PT SHORT TERM GOAL #4   Title Patient will be independent in correctly and consistently performing appropriate HEP, to be updated PRN    Time 4   Period Weeks   Status Achieved           PT Long Term Goals - 03/06/15 1007    PT LONG TERM GOAL #1   Title Patient will demonstrate 5/5 strength in bilateral lower extremities, at least 4/5 strength in  proximal musculature, and at least 4/5 strength in general core    Time 8   Period Weeks   Status On-going   PT LONG TERM GOAL #2   Title Patient will demonstrate the ablity to ambulate at least 1428ft during 6 minute walk test with no rest breaks, at a gait speed of at least 1.43m/s   Time 8   Period Weeks   Status On-going   PT LONG TERM GOAL #3   Title Patient will demonstrate the ability to maintain SLS for at least 45 seconds each lower extremity with no HHA    Time 8   Period Weeks   Status On-going   PT LONG TERM GOAL #4   Title Patient will demonstrate an increase in overall FACIT-F score to at least 130 to indicate improved quality of life    Time 8   Period Weeks   Status Achieved               Plan - 03/20/15 1018    Clinical Impression Statement Patient arrived with no pain today so continued with functional stretching, balance, and strengthening activities. Patient able to perform all exercises with occasional cues for form and no increase in pain today, although she does continue to state that her R hip is remaining rather stiff and is continuing to be a problem for her, reports that right now iit is one of the major things she would like to get better. Patietn only has one more scheduled session and should be re-assessed next visit.    Pt will benefit from skilled therapeutic intervention in order to improve on the following deficits Abnormal gait;Decreased endurance;Hypomobility;Decreased activity tolerance;Decreased  strength;Pain;Decreased balance;Decreased mobility;Difficulty walking;Decreased coordination;Decreased safety awareness;Postural dysfunction   Rehab Potential Good   PT Frequency 2x / week   PT Duration 8 weeks   PT Treatment/Interventions ADLs/Self Care Home Management;Gait training;Stair training;Functional mobility training;Therapeutic activities;Therapeutic exercise;Balance training;Neuromuscular re-education;Patient/family education;Manual techniques;Energy conservation   PT Next Visit Plan Re-assess next session due to being last scheduled session; patient may benefit from specific hip referral from MD due to continued complaints about this area.    PT Home Exercise Plan No changes   Consulted and Agree with Plan of Care Patient        Problem List Patient Active Problem List   Diagnosis Date Noted  . Abnormal ECG   . DOE (dyspnea on exertion) 04/12/2014  . Annual physical exam 09/08/2013  . Screen for colon cancer 04/22/2011  . MIGRAINE HEADACHE 01/23/2009  . GERD 01/23/2009  . GASTRITIS 01/23/2009  . LOOSE STOOLS 01/23/2009  . ALLERGY 01/23/2009  . UTERINE CANCER, HX OF 01/23/2009    Deniece Ree PT, DPT [facility] [address] [place], [name], [address] Phone: [phone]   Fax:  [phone]

## 2015-03-22 ENCOUNTER — Ambulatory Visit (HOSPITAL_COMMUNITY): Payer: Medicare Other | Admitting: Physical Therapy

## 2015-03-27 ENCOUNTER — Ambulatory Visit (HOSPITAL_COMMUNITY): Payer: Medicare Other | Attending: Hematology & Oncology | Admitting: Physical Therapy

## 2015-03-27 DIAGNOSIS — M6289 Other specified disorders of muscle: Secondary | ICD-10-CM | POA: Diagnosis not present

## 2015-03-27 DIAGNOSIS — M25652 Stiffness of left hip, not elsewhere classified: Secondary | ICD-10-CM

## 2015-03-27 DIAGNOSIS — M6281 Muscle weakness (generalized): Secondary | ICD-10-CM

## 2015-03-27 DIAGNOSIS — R262 Difficulty in walking, not elsewhere classified: Secondary | ICD-10-CM | POA: Diagnosis not present

## 2015-03-27 DIAGNOSIS — Z853 Personal history of malignant neoplasm of breast: Secondary | ICD-10-CM | POA: Insufficient documentation

## 2015-03-27 DIAGNOSIS — R198 Other specified symptoms and signs involving the digestive system and abdomen: Secondary | ICD-10-CM

## 2015-03-27 DIAGNOSIS — R29898 Other symptoms and signs involving the musculoskeletal system: Secondary | ICD-10-CM | POA: Diagnosis not present

## 2015-03-27 DIAGNOSIS — M25651 Stiffness of right hip, not elsewhere classified: Secondary | ICD-10-CM | POA: Insufficient documentation

## 2015-03-27 NOTE — Therapy (Signed)
[place], [name], [address] Phone: [phone]   Fax:  [phone]  Physical Therapy Treatment  Patient Details  Name: Kayla Thomas MRN: [id number] Date of Birth: 1940/11/18 Referring Provider:  Patrici Ranks, MD  Encounter Date: 03/27/2015      PT End of Session - 03/27/15 1647    Visit Number 14   Number of Visits 14   Date for PT Re-Evaluation 03/22/15   Authorization Type Medicare    PT Start Time 1435   PT Stop Time 1515   PT Time Calculation (min) 40 min      Past Medical History  Diagnosis Date  . Uterine cancer 1998  . Chronic back pain   . GERD (gastroesophageal reflux disease) 01/21/2006    EGD Dr Oneida Alar mild chronic gastritis, (NO h pylori) otherwise normal  . Sinus congestion   . Headache(784.0)   . Breast cancer   . Thumb tendonitis September 2014    left  . Breast cancer   . Wears partial dentures   . Gastric ulcer     History    Past Surgical History  Procedure Laterality Date  . Cholecystectomy  11/1999  . S/p hysterectomy  1998    uterine ca  . Temperal/mandibular  1974  . Esophagogastroduodenoscopy  01/21/06    mild antral erythema bx h-pylori/normal esophagus without evidence of mass or Barrett's/normal pylorus and duodenum  . Cataracts    . Colonoscopy  05/13/2011    Procedure: COLONOSCOPY;  Surgeon: Dorothyann Peng, MD;  [facility];  Service: Endoscopy;  Laterality: N/A;  8:30  . Tonsillectomy  1964  . Abscess drainage  03-04-05    insect bite  . Cataract extraction w/phaco  10/06/2011    Procedure: CATARACT EXTRACTION PHACO AND INTRAOCULAR LENS PLACEMENT (IOC);  Surgeon: Williams Che, MD;  Location: [facility];  Service: Ophthalmology;  Laterality: Left;  CDE:  5.76  . Abdominal hysterectomy    . Partial mastectomy with needle localization and axillary sentinel lymph node bx Left 08/18/2012    Procedure: PARTIAL MASTECTOMY WITH NEEDLE LOCALIZATION  AND AXILLARY SENTINEL LYMPH NODE BX;  Surgeon: Jamesetta So, MD;  Location: [facility];  Service: General;  Laterality: Left;  Need Frozen Section/Sentinel Node Bx @ 8:00am/Needle Loc @ 9:00am  . Trigger finger release Right 05/04/2014    Procedure: RELEASE TRIGGER FINGER/A-1 PULLEY RIGHT RING FINGER;  Surgeon: Daryll Brod, MD;  Location: [facility];  Service: Orthopedics;  Laterality: Right;  . Cardiac catheterization N/A 10/31/2014    Procedure: Right/Left Heart Cath and Coronary Angiography;  Surgeon: Troy Sine, MD;  Location: [facility];  Service: Cardiovascular;  Laterality: N/A;    There were no vitals filed for this visit.  Visit Diagnosis:  Weakness of both lower extremities  Proximal muscle weakness  Difficulty walking  Stiffness of hip joint, right  Abdominal weakness  Hip joint stiffness, left  History of breast cancer      Subjective Assessment - 03/27/15 1433    Subjective Pt states she is not having pain right now. She states she feels stiff at times.  Pt states that she does not walk except around the house.    Pertinent History Patient has had low back surgery in the past-  four screws and a plate. History of uterine cancer. Was diagnosed with L breast cancer in 2014, had been taking oral cancer medications as well as radiation.  No longer on radiation, just doing oral medicines.  Still doing water exercise at [facility] 3x/week, a little tired after but mostly OK afterwards.    How long can you sit comfortably? no limitations   How long can you stand comfortably? no limitations   How long can you walk comfortably? 15 minutes   Currently in Pain? No/denies            [facility] PT Assessment - 03/27/15 0001    Assessment   Medical Diagnosis L breast cancer    Onset Date/Surgical Date --  2014   Next MD Visit September with Dr. 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    Observation/Other Assessments   Focus on Therapeutic Outcomes ([place])  FACIT-F142; Victor.6; pain 1; distress 0    Posture/Postural Control   Posture Comments increased ER R hip, forward head with B IR shoulders, reduced spinal curves, apparent varus knees    AROM   Right Hip External Rotation  42  was 37   Right Hip Internal Rotation  36   Left Hip External Rotation  60  WFL    Left Hip Internal Rotation  30   Lumbar Flexion 110  was 88    Lumbar Extension 30  was 18   Lumbar - Right Side Bend 27   Lumbar - Left Side Bend 25   Strength   Overall Strength Comments lower abs approx 3-/5, upper abs approx 4/5    Right Hip Flexion 5/5  was 4+/5 on 9/13    Right Hip Extension 4+/5  was 4-   Right Hip ABduction 5/5  was 4-    Left Hip Flexion 5/5   Left Hip Extension 3/5  measured in sidelying, patient does not like prone position   Left Hip ABduction 4-/5  was a 3+/5 on 9/13    Right Knee Flexion 5/5  was 4/5    Right Knee Extension 5/5  was 5/5 on 9/13   Left Knee Flexion 5/5  was 4/5   Left Knee Extension 5/5  was 5/5on 03/06/2015   Right Ankle Dorsiflexion 5/5  was 4+/5   Left Ankle Dorsiflexion 5/5   Ambulation/Gait   Gait Comments increased ER of R hip, flexed at hips, proximal muscle weakness, reduced rotation of pelvis and trunk, possible LLD    6 minute walk test results    Endurance additional comments --   Balance   Balance Assessed Yes   Standardized Balance Assessment   Standardized Balance Assessment Timed Up and Go Test   Timed Up and Go Test   Normal TUG (seconds) 10.98   High Level Balance   High Level Balance Comments --      6 minute walk test= 1,165 feet                         PT Short Term Goals - 03/27/15 1451    PT SHORT TERM GOAL #1    Title Patient to be able to verbally report the importance of maintaining good posture through all functional tasks, and will be able to maintain correct posture during all functional activities    Time 4   Period Weeks   Status Achieved   PT  SHORT TERM GOAL #2   Title Patient to demonstrate bilateral hip ER of at least 50 degrees, bilateral hip IR of at least 35 degrees in order to enhance overall mobility and mechanics    Time 4   Period Weeks   Status Achieved   PT SHORT TERM GOAL #3   Title Patient to report that she is experiencing no more than 2/10 pain in her low back with all functional weightbearing tasks of at least 60 minutes in duration    Time 4   Period Weeks   Status Achieved   PT SHORT TERM GOAL #4   Title Patient will be independent in correctly and consistently performing appropriate HEP, to be updated PRN    Time 4   Period Weeks   Status Achieved           PT Long Term Goals - 04/17/2015 1452    PT LONG TERM GOAL #1   Title Patient will demonstrate 5/5 strength in bilateral lower extremities, at least 4/5 strength in proximal musculature, and at least 4/5 strength in general core    Time 8   Period Weeks   Status Achieved   PT LONG TERM GOAL #2   Title Patient will demonstrate the ablity to ambulate at least 141f during 6 minute walk test with no rest breaks, at a gait speed of at least 1.32m   Baseline [id number] ambulated 1,165 ft in 6:00 minute    Time 8   Period Weeks   PT LONG TERM GOAL #3   Title Patient will demonstrate the ability to maintain SLS for at least 45 seconds each lower extremity with no HHA    Time 8   Period Weeks   Status Achieved   PT LONG TERM GOAL #4   Title Patient will demonstrate an increase in overall FACIT-F score to at least 130 to indicate improved quality of life    Time 8   Period Weeks   Status Achieved               Plan - 10Oct 25, 2016648    Clinical Impression Statement Pt reassessed today with all goal  met or partially met.  Pt feels she is ready for discharge.     PT Next Visit Plan discharge    PT Home Exercise Plan No changes          G-Codes - [id number]    Functional Assessment Tool Used Based on skilled clinical assessment of strength, ROM, gait, posture, balance, functional activity tolerance    Functional Limitation Mobility: Walking and moving around   Mobility: Walking and Moving Around Goal Status ([phone] least 1 percent but less than 20 percent impaired, limited or restricted   Mobility: Walking and Moving Around Discharge Status ([phone] least 1 percent but less than 20 percent impaired, limited or restricted      Problem List Patient Active Problem List   Diagnosis Date Noted  . Abnormal ECG   . DOE (dyspnea on exertion) 04/12/2014  . Annual physical exam 09/08/2013  . Screen for colon cancer 04/22/2011  . MIGRAINE HEADACHE 01/23/2009  . GERD 01/23/2009  . GASTRITIS 01/23/2009  . LOOSE STOOLS 01/23/2009  . ALLERGY 01/23/2009  . UTERINE CANCER, HX OF 01/23/2009   CyRayetta HumphreyPT CLT [phone]-[id number] PM  [place]hone: [phone] Fax:  [phone] THERAPY DISCHARGE SUMMARY  Visits from Start of Care: 14  Current functional level related to goals / functional outcomes: See above Remaining deficits: See above   Education / Equipment: HEP  Plan: Patient agrees to discharge.  Patient goals were met. Patient is being discharged due to meeting the stated rehab goals.  ?????       Rayetta Humphrey, [place] CLT [phone]

## 2015-04-04 DIAGNOSIS — M151 Heberden's nodes (with arthropathy): Secondary | ICD-10-CM | POA: Diagnosis not present

## 2015-04-04 DIAGNOSIS — M152 Bouchard's nodes (with arthropathy): Secondary | ICD-10-CM | POA: Diagnosis not present

## 2015-04-30 ENCOUNTER — Encounter (HOSPITAL_COMMUNITY): Payer: Medicare Other | Attending: Hematology & Oncology | Admitting: Hematology & Oncology

## 2015-04-30 ENCOUNTER — Encounter (HOSPITAL_COMMUNITY): Payer: Self-pay | Admitting: Hematology & Oncology

## 2015-04-30 VITALS — BP 131/60 | HR 65 | Temp 98.4°F | Resp 18 | Wt 190.3 lb

## 2015-04-30 DIAGNOSIS — Z23 Encounter for immunization: Secondary | ICD-10-CM | POA: Diagnosis not present

## 2015-04-30 DIAGNOSIS — C50412 Malignant neoplasm of upper-outer quadrant of left female breast: Secondary | ICD-10-CM

## 2015-04-30 DIAGNOSIS — M25551 Pain in right hip: Secondary | ICD-10-CM | POA: Diagnosis not present

## 2015-04-30 DIAGNOSIS — Z8542 Personal history of malignant neoplasm of other parts of uterus: Secondary | ICD-10-CM

## 2015-04-30 DIAGNOSIS — Z0000 Encounter for general adult medical examination without abnormal findings: Secondary | ICD-10-CM

## 2015-04-30 DIAGNOSIS — C50919 Malignant neoplasm of unspecified site of unspecified female breast: Secondary | ICD-10-CM | POA: Insufficient documentation

## 2015-04-30 MED ORDER — INFLUENZA VAC SPLIT QUAD 0.5 ML IM SUSY
0.5000 mL | PREFILLED_SYRINGE | Freq: Once | INTRAMUSCULAR | Status: AC
  Administered 2015-04-30: 0.5 mL via INTRAMUSCULAR
  Filled 2015-04-30: qty 0.5

## 2015-04-30 NOTE — Patient Instructions (Addendum)
[place] at [facility] Discharge Instructions  RECOMMENDATIONS MADE BY THE CONSULTANT AND ANY TEST RESULTS WILL BE SENT TO YOUR REFERRING PHYSICIAN.  Exam and discussion by Dr. Whitney Muse Flu vaccine today Report any new lumps, bone pain, shortness of breath or other symptoms. Call with any concerns.  Follow-up: Office visit in 4 months. Will call you with the date and time of your bone scan.  Thank you for choosing [place] at [facility] to provide your oncology and hematology care.  To afford each patient quality time with our provider, please arrive at least 15 minutes before your scheduled appointment time.    You need to re-schedule your appointment should you arrive 10 or more minutes late.  We strive to give you quality time with our providers, and arriving late affects you and other patients whose appointments are after yours.  Also, if you no show three or more times for appointments you may be dismissed from the clinic at the providers discretion.     Again, thank you for choosing [facility].  Our hope is that these requests will decrease the amount of time that you wait before being seen by our physicians.       _____________________________________________________________  Should you have questions after your visit to [facility], please contact our office at [phone] between the hours of 8:30 a.m. and 4:30 p.m.  Voicemails left after 4:30 p.m. will not be returned until the following business day.  For prescription refill requests, have your pharmacy contact our office.

## 2015-04-30 NOTE — Progress Notes (Signed)
[place] at [place] NOTE  Patient Care Team: Asencion Noble, MD as PCP - General (Internal Medicine) Danie Binder, MD (Gastroenterology) Herminio Commons, MD as Attending Physician (Cardiology)  CHIEF COMPLAINTS/PURPOSE OF CONSULTATION:  Stage I left breast cancer, s/p Lumpectomy, radiotherapy and arimidex Arimidex started on 12/21/2012 History of uterine carcinoma s/p hysterectomy DEXA 12/12/2014 with normal bone density  HISTORY OF PRESENTING ILLNESS:  Kayla Thomas 74 y.o. female is here because of Stage I Left Breast Cancer. She had been on Arimidex but became unable to do her ADLs secondary to significant aching and fatigue. After discussion of risks and benefits she opted to take a break from her Arimidex therapy. She did feel better after discontinuing arimidex with improvement in her energy. She was started on Tamoxifen.  Kayla Thomas is here alone today. She is doing well on the tamoxifen. No new onset issues with her breasts. Soreness of the breasts is unchanged.  She continues to experience right hip pain. She is agreeable to having a bone scan to investigate this pain.  She has not received a flu shot this year.   MEDICAL HISTORY:  Past Medical History  Diagnosis Date  . Uterine cancer ([place]) 1998  . Chronic back pain   . GERD (gastroesophageal reflux disease) 01/21/2006    EGD Dr Oneida Alar mild chronic gastritis, (NO h pylori) otherwise normal  . Sinus congestion   . Headache(784.0)   . Breast cancer ([place])   . Thumb tendonitis September 2014    left  . Breast cancer ([place])   . Wears partial dentures   . Gastric ulcer     History    SURGICAL HISTORY: Past Surgical History  Procedure Laterality Date  . Cholecystectomy  11/1999  . S/p hysterectomy  1998    uterine ca  . Temperal/mandibular  1974  . Esophagogastroduodenoscopy  01/21/06    mild antral erythema bx h-pylori/normal esophagus without evidence of mass or  Barrett's/normal pylorus and duodenum  . Cataracts    . Colonoscopy  05/13/2011    Procedure: COLONOSCOPY;  Surgeon: Dorothyann Peng, MD;  [facility];  Service: Endoscopy;  Laterality: N/A;  8:30  . Tonsillectomy  1964  . Abscess drainage  03-04-05    insect bite  . Cataract extraction w/phaco  10/06/2011    Procedure: CATARACT EXTRACTION PHACO AND INTRAOCULAR LENS PLACEMENT (IOC);  Surgeon: Williams Che, MD;  Location: [facility];  Service: Ophthalmology;  Laterality: Left;  CDE:  5.76  . Abdominal hysterectomy    . Partial mastectomy with needle localization and axillary sentinel lymph node bx Left 08/18/2012    Procedure: PARTIAL MASTECTOMY WITH NEEDLE LOCALIZATION AND AXILLARY SENTINEL LYMPH NODE BX;  Surgeon: Jamesetta So, MD;  Location: [facility];  Service: General;  Laterality: Left;  Need Frozen Section/Sentinel Node Bx @ 8:00am/Needle Loc @ 9:00am  . Trigger finger release Right 05/04/2014    Procedure: RELEASE TRIGGER FINGER/A-1 PULLEY RIGHT RING FINGER;  Surgeon: Daryll Brod, MD;  Location: [facility];  Service: Orthopedics;  Laterality: Right;  . Cardiac catheterization N/A 10/31/2014    Procedure: Right/Left Heart Cath and Coronary Angiography;  Surgeon: Troy Sine, MD;  Location: [facility];  Service: Cardiovascular;  Laterality: N/A;    SOCIAL HISTORY: Social History   Social History  . Marital Status: Widowed    Spouse Name: N/A  . Number of Children: 1  . Years of  Education: N/A   Occupational History  . retired; Facilities manager asst    Social History Main Topics  . Smoking status: Never Smoker   . Smokeless tobacco: Never Used  . Alcohol Use: No  . Drug Use: No  . Sexual Activity: Not Currently    Birth Control/ Protection: Surgical   Other Topics Concern  . Not on file   Social History Narrative   1 adopted son-grown   Lives alone    FAMILY HISTORY: Family History  Problem Relation Age of Onset  . Liver cancer Father   .  Alcohol abuse Father   . Hypertension Mother   . COPD Mother   . Diabetes Brother    indicated that her mother is deceased. She indicated that her father is deceased. She indicated that both of her brothers are alive.   ALLERGIES:  is allergic to aspirin; iron; sulfa antibiotics; and ancef.  MEDICATIONS:  Current Outpatient Prescriptions  Medication Sig Dispense Refill  . B Complex-C (SUPER B COMPLEX PO) Take 1 tablet by mouth every morning.     . Calcium Carb-Cholecalciferol (CALCIUM 600/VITAMIN D3) 600-800 MG-UNIT TABS Take 1 capsule by mouth 2 (two) times daily.    . diclofenac sodium (VOLTAREN) 1 % GEL     . Diphenhydramine-PE-APAP 25-5-325 MG TABS Take 1 tablet by mouth at bedtime.    . Glucosamine-Chondroitin (GLUCOSAMINE CHONDR COMPLEX PO) Take 1 tablet by mouth 2 (two) times daily.     Marland Kitchen ipratropium (ATROVENT) 0.03 % nasal spray Place 2 sprays into the nose every 12 (twelve) hours.    Marland Kitchen loperamide (IMODIUM) 2 MG capsule Take 2 mg by mouth 4 (four) times daily as needed. For diarrhea    . LUTEIN PO Take 45 mg by mouth daily.     . Multiple Vitamins-Minerals (MULTIVITAMINS THER. W/MINERALS) TABS Take 1 tablet by mouth every morning.     Marland Kitchen omeprazole (PRILOSEC) 20 MG capsule Take 20 mg by mouth 2 (two) times daily before a meal.     . tamoxifen (NOLVADEX) 20 MG tablet Take 1 tablet (20 mg total) by mouth daily. 30 tablet 3  . TURMERIC PO Take 1 tablet by mouth daily.      No current facility-administered medications for this visit.    Review of Systems  Constitutional: Negative. Musculoskeletal: Positive for joint pain, chronic arthritis, but general joint aching improved since discontinuation of arimidex      Osteoarthritis, "aches". Right hip pain.   14 point ROS was done and is otherwise as detailed above or in HPI  PHYSICAL EXAMINATION: ECOG PERFORMANCE STATUS: 1 - Symptomatic but completely ambulatory  Filed Vitals:   04/30/15 1147  BP: 131/60  Pulse: 65  Temp:  98.4 F (36.9 C)  Resp: 18   Filed Weights   04/30/15 1147  Weight: 190 lb 4.8 oz (86.32 kg)    Physical Exam  Constitutional: She is oriented to person, place, and time and well-developed, well-nourished, and in no distress.  HENT:  Head: Normocephalic and atraumatic.  Nose: Nose normal.  Mouth/Throat: Oropharynx is clear and moist. No oropharyngeal exudate.  Eyes: Conjunctivae and EOM are normal. Pupils are equal, round, and reactive to light. Right eye exhibits no discharge. Left eye exhibits no discharge. No scleral icterus.  Neck: Normal range of motion. Neck supple. No tracheal deviation present. No thyromegaly present.  Cardiovascular: Normal rate, regular rhythm and normal heart sounds.  Exam reveals no gallop and no friction rub.   No murmur heard. Pulmonary/Chest:  Effort normal and breath sounds normal. She has no wheezes. She has no rales.  Right breast exam is normal. Left breast has radiation changes. Incision site at about the 2 o'clock position. Some scar tissue. Nipple mildly inverted with some redness. Abdominal: Soft. Bowel sounds are normal. She exhibits no distension and no mass. There is no tenderness. There is no rebound and no guarding.  Musculoskeletal: Range of motion decreased due to right hip pain. She exhibits no edema. significant OA of the hands Lymphadenopathy:    She has no cervical adenopathy.  Neurological: She is alert and oriented to person, place, and time. She has normal reflexes. No cranial nerve deficit. Gait normal. Coordination normal.  Skin: Skin is warm and dry. No rash noted.  Large surgical incision site in mid-back.   Psychiatric: Mood, memory, affect and judgment normal.  Nursing note and vitals reviewed.   LABORATORY DATA:  I have reviewed the data as listed Lab Results  Component Value Date   WBC 7.3 11/30/2014   HGB 13.7 11/30/2014   HCT 39.7 11/30/2014   MCV 94.1 11/30/2014   PLT 208 11/30/2014   CMP     Component Value  Date/Time   NA 139 11/30/2014 1220   K 4.3 11/30/2014 1220   CL 104 11/30/2014 1220   CO2 26 11/30/2014 1220   GLUCOSE 90 11/30/2014 1220   BUN 17 11/30/2014 1220   CREATININE 0.75 11/30/2014 1220   CREATININE 0.81 10/30/2014 0931   CALCIUM 9.2 11/30/2014 1220   PROT 6.7 11/30/2014 1220   ALBUMIN 4.3 11/30/2014 1220   AST 22 11/30/2014 1220   ALT 29 11/30/2014 1220   ALKPHOS 120 11/30/2014 1220   BILITOT 0.5 11/30/2014 1220   GFRNONAA >60 11/30/2014 1220   GFRAA >60 11/30/2014 1220      RADIOLOGY:   CLINICAL DATA: History of left lumpectomy 2014.  EXAM: DIGITAL DIAGNOSTIC BILATERAL MAMMOGRAM WITH 3D TOMOSYNTHESIS AND CAD  COMPARISON: 07/28/2012 and earlier  ACR Breast Density Category b: There are scattered areas of fibroglandular density.  FINDINGS: Postoperative changes are seen in the left breast. No suspicious mass, distortion, or microcalcifications are identified to suggest presence of malignancy.  Mammographic images were processed with CAD.  IMPRESSION: No mammographic evidence for malignancy.  RECOMMENDATION: Diagnostic mammogram is suggested in 1 year. (Code:DM-B-01Y)  I have discussed the findings and recommendations with the patient. Results were also provided in writing at the conclusion of the visit. If applicable, a reminder letter will be sent to the patient regarding the next appointment.  BI-RADS CATEGORY 2: Benign.   Electronically Signed  By: Nolon Nations M.D.  On: 11/28/2014 10:24      ASSESSMENT & PLAN:  Stage I left breast cancer, s/p Lumpectomy, radiotherapy and arimidex Arimidex started on 12/21/2012 History of uterine carcinoma Arimidex induced joint pain and fatigue Back pain, OA   She is doing much better on Tamoxifen. Plan is for continuation of current therapy. She is up to date on her breast exam and mammogram.   I have ordered a bone scan to investigate her right hip pain. It is not improving  and limiting her ADL's.   She received a flu shot today.  We will see her again in 4 months. Her next breast exam with [place] will be in one year.  All questions were answered. The patient knows to call the clinic with any problems, questions or concerns.   This document serves as a record of services personally performed by Larene Beach  Wayden Schwertner, MD. It was created on her behalf by Arlyce Harman, a trained medical scribe. The creation of this record is based on the scribe's personal observations and the provider's statements to them. This document has been checked and approved by the attending provider.  I have reviewed the above documentation for accuracy and completeness, and I agree with the above.  This note was electronically signed.   Patrici Ranks, MD

## 2015-04-30 NOTE — Progress Notes (Signed)
Kayla Thomas presents today for injection per MD orders. Flu vaccine administered IM in right deltoid. Administration without incident. Patient tolerated well.

## 2015-05-08 ENCOUNTER — Encounter (HOSPITAL_COMMUNITY)
Admission: RE | Admit: 2015-05-08 | Discharge: 2015-05-08 | Disposition: A | Discharge status: Home / Self Care | Payer: Medicare Other | Source: Ambulatory Visit | Attending: Hematology & Oncology | Admitting: Hematology & Oncology

## 2015-05-08 ENCOUNTER — Encounter (HOSPITAL_COMMUNITY): Payer: Self-pay

## 2015-05-08 DIAGNOSIS — M19041 Primary osteoarthritis, right hand: Secondary | ICD-10-CM | POA: Insufficient documentation

## 2015-05-08 DIAGNOSIS — M25551 Pain in right hip: Secondary | ICD-10-CM | POA: Diagnosis not present

## 2015-05-08 DIAGNOSIS — Z8542 Personal history of malignant neoplasm of other parts of uterus: Secondary | ICD-10-CM | POA: Diagnosis not present

## 2015-05-08 DIAGNOSIS — C50412 Malignant neoplasm of upper-outer quadrant of left female breast: Secondary | ICD-10-CM | POA: Diagnosis not present

## 2015-05-08 MED ORDER — TECHNETIUM TC 99M MEDRONATE IV KIT
25.0000 | PACK | Freq: Once | INTRAVENOUS | Status: AC | PRN
  Administered 2015-05-08: 27 via INTRAVENOUS

## 2015-05-13 ENCOUNTER — Other Ambulatory Visit (HOSPITAL_COMMUNITY): Payer: Self-pay | Admitting: Hematology & Oncology

## 2015-05-13 DIAGNOSIS — M25551 Pain in right hip: Secondary | ICD-10-CM

## 2015-05-13 DIAGNOSIS — R9389 Abnormal findings on diagnostic imaging of other specified body structures: Secondary | ICD-10-CM

## 2015-05-15 ENCOUNTER — Ambulatory Visit (HOSPITAL_COMMUNITY)
Admission: RE | Admit: 2015-05-15 | Discharge: 2015-05-15 | Disposition: A | Discharge status: Home / Self Care | Payer: Medicare Other | Source: Ambulatory Visit | Attending: Hematology & Oncology | Admitting: Hematology & Oncology

## 2015-05-15 DIAGNOSIS — Z853 Personal history of malignant neoplasm of breast: Secondary | ICD-10-CM | POA: Diagnosis not present

## 2015-05-15 DIAGNOSIS — R9389 Abnormal findings on diagnostic imaging of other specified body structures: Secondary | ICD-10-CM

## 2015-05-15 DIAGNOSIS — M25551 Pain in right hip: Secondary | ICD-10-CM | POA: Diagnosis not present

## 2015-06-05 ENCOUNTER — Other Ambulatory Visit (HOSPITAL_COMMUNITY): Payer: Self-pay | Admitting: Hematology & Oncology

## 2015-08-28 ENCOUNTER — Encounter (HOSPITAL_COMMUNITY): Payer: Self-pay | Admitting: Hematology & Oncology

## 2015-08-28 ENCOUNTER — Encounter (HOSPITAL_COMMUNITY): Payer: Medicare Other | Attending: Hematology & Oncology | Admitting: Hematology & Oncology

## 2015-08-28 ENCOUNTER — Other Ambulatory Visit (HOSPITAL_COMMUNITY): Payer: Self-pay | Admitting: Hematology & Oncology

## 2015-08-28 VITALS — BP 151/63 | HR 71 | Temp 98.4°F | Resp 18 | Wt 193.0 lb

## 2015-08-28 DIAGNOSIS — M1611 Unilateral primary osteoarthritis, right hip: Secondary | ICD-10-CM | POA: Diagnosis not present

## 2015-08-28 DIAGNOSIS — M2550 Pain in unspecified joint: Secondary | ICD-10-CM

## 2015-08-28 DIAGNOSIS — C50412 Malignant neoplasm of upper-outer quadrant of left female breast: Secondary | ICD-10-CM

## 2015-08-28 DIAGNOSIS — C50912 Malignant neoplasm of unspecified site of left female breast: Secondary | ICD-10-CM | POA: Diagnosis not present

## 2015-08-28 DIAGNOSIS — R5383 Other fatigue: Secondary | ICD-10-CM | POA: Diagnosis not present

## 2015-08-28 DIAGNOSIS — M549 Dorsalgia, unspecified: Secondary | ICD-10-CM

## 2015-08-28 MED ORDER — TAMOXIFEN CITRATE 20 MG PO TABS: 20.0000 mg | ORAL_TABLET | Freq: Every day | ORAL | Status: DC

## 2015-08-28 NOTE — Patient Instructions (Signed)
..  [place] at [facility] Discharge Instructions  RECOMMENDATIONS MADE BY THE CONSULTANT AND ANY TEST RESULTS WILL BE SENT TO YOUR REFERRING PHYSICIAN.  Mammogram in June We sent refills to [facility] for your tamoxifen Return to see [place] in 4 months Call us with any new bone pain, lumps or bumps  Thank you for choosing [place] at [facility] to provide your oncology and hematology care.  To afford each patient quality time with our provider, please arrive at least 15 minutes before your scheduled appointment time.   Beginning January 23rd 2017 lab work for the [facility] will be done in the  Main lab at [facility] on 1st floor. If you have a lab appointment with the [facility] please come in thru the  Main Entrance and check in at the main information desk  You need to re-schedule your appointment should you arrive 10 or more minutes late.  We strive to give you quality time with our providers, and arriving late affects you and other patients whose appointments are after yours.  Also, if you no show three or more times for appointments you may be dismissed from the clinic at the providers discretion.     Again, thank you for choosing [facility].  Our hope is that these requests will decrease the amount of time that you wait before being seen by our physicians.       _____________________________________________________________  Should you have questions after your visit to [facility], please contact our office at [phone] between the hours of 8:30 a.m. and 4:30 p.m.  Voicemails left after 4:30 p.m. will not be returned until the following business day.  For prescription refill requests, have your pharmacy contact our office.         Resources For Cancer Patients and their Caregivers ? American Cancer Society: Can assist with transportation, wigs, general needs, runs Look Good Feel Better.         [phone] ? Cancer Care: Provides financial assistance, online support groups, medication/co-pay assistance.  1-800-813-HOPE 939-504-5121) ? [place] Assists [place] cancer patients and their families through emotional , educational and financial support.  681-329-8742 ? [place] DSS Where to apply for food stamps, Medicaid and utility assistance. 260-557-2115 ? [facility]: Transportation to medical appointments. (434) 537-3137 ? Social Security Administration: May apply for disability if have a Stage IV cancer. 680-746-0523 6294063079 ? [employer], Disability and Transit Services: Assists with nutrition, care and transit needs. ([phone] [phone]

## 2015-08-28 NOTE — Progress Notes (Signed)
[facility] NOTE  Patient Care Team: Asencion Noble, MD as PCP - General (Internal Medicine) Danie Binder, MD (Gastroenterology) Herminio Commons, MD as Attending Physician (Cardiology)  CHIEF COMPLAINTS/PURPOSE OF CONSULTATION:  Stage I left breast cancer, s/p Lumpectomy, radiotherapy and arimidex Arimidex started on 12/21/2012 History of uterine carcinoma s/p hysterectomy DEXA 12/12/2014 with normal bone density  HISTORY OF PRESENTING ILLNESS:  Kayla Thomas 75 y.o. female is here because of Stage I Left Breast Cancer. She had been on Arimidex but became unable to do her ADLs secondary to significant aching and fatigue. After discussion of risks and benefits she opted to take a break from her Arimidex therapy. She did feel better after discontinuing arimidex with improvement in her energy. She was started on Tamoxifen.  Kayla Thomas returns to the [facility] alone today.  She reports that she is still doing well on her tamoxifen. She says she's had lots of energy and has been getting lots done. She notes that she has been sleeping, eating well, with no new pain anywhere. She has no concerns about anything today, and confirms that she is taking Calcium and Vitamin D.  [facility], and stays active with her 38 year old grandson.  Kayla Thomas notes that she called [facility] last week for some refills, but that she was referred to ask [place] back here for refills twice in a row.  She notes that she has had some sinus issues and been fighting off whatever's seasonally going around, but other than that, no big complaints.   MEDICAL HISTORY:  Past Medical History  Diagnosis Date  . Uterine cancer ([facility]) 1998  . Chronic back pain   . GERD (gastroesophageal reflux disease) 01/21/2006    EGD Dr Oneida Alar mild chronic gastritis, (NO h pylori) otherwise normal  . Sinus congestion   .  Headache(784.0)   . Breast cancer ([place])   . Thumb tendonitis September 2014    left  . Breast cancer ([place])   . Wears partial dentures   . Gastric ulcer     History    SURGICAL HISTORY: Past Surgical History  Procedure Laterality Date  . Cholecystectomy  11/1999  . S/p hysterectomy  1998    uterine ca  . Temperal/mandibular  1974  . Esophagogastroduodenoscopy  01/21/06    mild antral erythema bx h-pylori/normal esophagus without evidence of mass or Barrett's/normal pylorus and duodenum  . Cataracts    . Colonoscopy  05/13/2011    Procedure: COLONOSCOPY;  Surgeon: Dorothyann Peng, MD;  Location[facility];  Service: Endoscopy;  Laterality: N/A;  8:30  . Tonsillectomy  1964  . Abscess drainage  03-04-05    insect bite  . Cataract extraction w/phaco  10/06/2011    Procedure: CATARACT EXTRACTION PHACO AND INTRAOCULAR LENS PLACEMENT (IOC);  Surgeon: Williams Che, MD;  Location: [facility] ORS;  Service: Ophthalmology;  Laterality: Left;  CDE:  5.76  . Abdominal hysterectomy    . Partial mastectomy with needle localization and axillary sentinel lymph node bx Left 08/18/2012    Procedure: PARTIAL MASTECTOMY WITH NEEDLE LOCALIZATION AND AXILLARY SENTINEL LYMPH NODE BX;  Surgeon: Jamesetta So, MD;  Location: [facility];  Service: General;  Laterality: Left;  Need Frozen Section/Sentinel Node Bx @ 8:00am/Needle Loc @ 9:00am  . Trigger finger release Right 05/04/2014    Procedure: RELEASE TRIGGER FINGER/A-1 PULLEY  RIGHT RING FINGER;  Surgeon: Daryll Brod, MD;  Location: [facility];  Service: Orthopedics;  Laterality: Right;  . Cardiac catheterization N/A 10/31/2014    Procedure: Right/Left Heart Cath and Coronary Angiography;  Surgeon: Troy Sine, MD;  Location: [place] [facility];  Service: Cardiovascular;  Laterality: N/A;    SOCIAL HISTORY: Social History   Social History  . Marital Status: Widowed    Spouse Name: N/A  . Number of Children: 1  . Years of Education: N/A    Occupational History  . retired; Facilities manager asst    Social History Main Topics  . Smoking status: Never Smoker   . Smokeless tobacco: Never Used  . Alcohol Use: No  . Drug Use: No  . Sexual Activity: Not Currently    Birth Control/ Protection: Surgical   Other Topics Concern  . Not on file   Social History Narrative   1 adopted son-grown   Lives alone    FAMILY HISTORY: Family History  Problem Relation Age of Onset  . Liver cancer Father   . Alcohol abuse Father   . Hypertension Mother   . COPD Mother   . Diabetes Brother    indicated that her mother is deceased. She indicated that her father is deceased. She indicated that both of her brothers are alive.   ALLERGIES:  is allergic to aspirin; iron; sulfa antibiotics; and ancef.  MEDICATIONS:  Current Outpatient Prescriptions  Medication Sig Dispense Refill  . B Complex-C (SUPER B COMPLEX PO) Take 1 tablet by mouth every morning.     . Calcium Carb-Cholecalciferol (CALCIUM 600/VITAMIN D3) 600-800 MG-UNIT TABS Take 1 capsule by mouth 2 (two) times daily.    . diclofenac sodium (VOLTAREN) 1 % GEL     . Diphenhydramine-PE-APAP 25-5-325 MG TABS Take 1 tablet by mouth at bedtime.    . Glucosamine-Chondroitin (GLUCOSAMINE CHONDR COMPLEX PO) Take 1 tablet by mouth 2 (two) times daily.     Marland Kitchen ipratropium (ATROVENT) 0.03 % nasal spray Place 2 sprays into the nose every 12 (twelve) hours.    Marland Kitchen loperamide (IMODIUM) 2 MG capsule Take 2 mg by mouth 4 (four) times daily as needed. For diarrhea    . LUTEIN PO Take 45 mg by mouth daily.     . Multiple Vitamins-Minerals (MULTIVITAMINS THER. W/MINERALS) TABS Take 1 tablet by mouth every morning.     Marland Kitchen omeprazole (PRILOSEC) 20 MG capsule Take 20 mg by mouth 2 (two) times daily before a meal.     . tamoxifen (NOLVADEX) 20 MG tablet Take 1 tablet (20 mg total) by mouth daily. 30 tablet 4  . TURMERIC PO Take 1 tablet by mouth daily.      No current facility-administered medications for this  visit.    Review of Systems  Constitutional: Negative. Musculoskeletal: Positive for joint pain, chronic arthritis, but general joint aching improved since discontinuation of arimidex      Osteoarthritis, "aches". Right hip pain.   14 point ROS was done and is otherwise as detailed above or in HPI   PHYSICAL EXAMINATION: ECOG PERFORMANCE STATUS: 1 - Symptomatic but completely ambulatory  Filed Vitals:   08/28/15 1052  BP: 151/63  Pulse: 71  Temp: 98.4 F (36.9 C)  Resp: 18   Filed Weights   08/28/15 1052  Weight: 193 lb (87.544 kg)    Physical Exam  Constitutional: She is oriented to person, place, and time and well-developed, well-nourished, and in no distress.  HENT:  Head:  Normocephalic and atraumatic.  Nose: Nose normal.  Mouth/Throat: Oropharynx is clear and moist. No oropharyngeal exudate.  Eyes: Conjunctivae and EOM are normal. Pupils are equal, round, and reactive to light. Right eye exhibits no discharge. Left eye exhibits no discharge. No scleral icterus.  Neck: Normal range of motion. Neck supple. No tracheal deviation present. No thyromegaly present.  Cardiovascular: Normal rate, regular rhythm and normal heart sounds.  Exam reveals no gallop and no friction rub.   No murmur heard. Pulmonary/Chest: Effort normal and breath sounds normal. She has no wheezes. She has no rales.  Abdominal: Soft. Bowel sounds are normal. She exhibits no distension and no mass. There is no tenderness. There is no rebound and no guarding.  Musculoskeletal: Range of motion decreased due to right hip pain. She exhibits no edema. significant OA of the hands Lymphadenopathy:    She has no cervical adenopathy.  Neurological: She is alert and oriented to person, place, and time. She has normal reflexes. No cranial nerve deficit. Gait normal. Coordination normal.  Skin: Skin is warm and dry. No rash noted.  Large surgical incision site in mid-back.   Psychiatric: Mood, memory, affect and  judgment normal.  Nursing note and vitals reviewed.   LABORATORY DATA:  I have reviewed the data as listed Lab Results  Component Value Date   WBC 7.3 11/30/2014   HGB 13.7 11/30/2014   HCT 39.7 11/30/2014   MCV 94.1 11/30/2014   PLT 208 11/30/2014   CMP     Component Value Date/Time   NA 139 11/30/2014 1220   K 4.3 11/30/2014 1220   CL 104 11/30/2014 1220   CO2 26 11/30/2014 1220   GLUCOSE 90 11/30/2014 1220   BUN 17 11/30/2014 1220   CREATININE 0.75 11/30/2014 1220   CREATININE 0.81 10/30/2014 0931   CALCIUM 9.2 11/30/2014 1220   PROT 6.7 11/30/2014 1220   ALBUMIN 4.3 11/30/2014 1220   AST 22 11/30/2014 1220   ALT 29 11/30/2014 1220   ALKPHOS 120 11/30/2014 1220   BILITOT 0.5 11/30/2014 1220   GFRNONAA >60 11/30/2014 1220   GFRAA >60 11/30/2014 1220    RADIOLOGY:   CLINICAL DATA: History of left lumpectomy 2014.  EXAM: DIGITAL DIAGNOSTIC BILATERAL MAMMOGRAM WITH 3D TOMOSYNTHESIS AND CAD  COMPARISON: 07/28/2012 and earlier  ACR Breast Density Category b: There are scattered areas of fibroglandular density.  FINDINGS: Postoperative changes are seen in the left breast. No suspicious mass, distortion, or microcalcifications are identified to suggest presence of malignancy.  Mammographic images were processed with CAD.  IMPRESSION: No mammographic evidence for malignancy.  RECOMMENDATION: Diagnostic mammogram is suggested in 1 year. (Code:DM-B-01Y)  I have discussed the findings and recommendations with the patient. Results were also provided in writing at the conclusion of the visit. If applicable, a reminder letter will be sent to the patient regarding the next appointment.  BI-RADS CATEGORY 2: Benign.   Electronically Signed  By: Nolon Nations M.D.  On: 11/28/2014 10:24     ASSESSMENT & PLAN:  Stage I left breast cancer, s/p Lumpectomy, radiotherapy and arimidex Arimidex started on 12/21/2012 History of uterine  carcinoma Arimidex induced joint pain and fatigue Back pain, OA Tamoxifen started 06/11/2015  She is doing much better on Tamoxifen. Plan is for continuation of current therapy. She is up to date on her breast exam and mammogram. She will be due for mammography in June. I have ordered that today.    We reviewed her bone scan . She does not  wish to proceed with additional imaging.   It is not improving and limiting her ADL's.   She can return in 4 months. She will need a breast exam at that time.  Orders Placed This Encounter  Procedures  . MM Digital Diagnostic Bilat    Standing Status: Future     Number of Occurrences:      Standing Expiration Date: 08/27/2016    Order Specific Question:  Reason for Exam (SYMPTOM  OR DIAGNOSIS REQUIRED)    Answer:  breast cancer history,  screening    Order Specific Question:  Preferred imaging location?    Answer:  [facility]    Standing Status: Future     Number of Occurrences:      Standing Expiration Date: 10/27/2016    Order Specific Question:  Reason for Exam (SYMPTOM  OR DIAGNOSIS REQUIRED)    Answer:  breast cancer, screening    Order Specific Question:  Preferred imaging location?    Answer:  [facility]    All questions were answered. The patient knows to call the clinic with any problems, questions or concerns.   This document serves as a record of services personally performed by Ancil Linsey, MD. It was created on her behalf by Toni Amend, a trained medical scribe. The creation of this record is based on the scribe's personal observations and the provider's statements to them. This document has been checked and approved by the attending provider.  I have reviewed the above documentation for accuracy and completeness, and I agree with the above.  This note was electronically signed.   Patrici Ranks, MD

## 2015-09-12 ENCOUNTER — Ambulatory Visit: Payer: Medicare Other | Admitting: Obstetrics and Gynecology

## 2015-09-19 NOTE — Progress Notes (Signed)
Cancelled appt by pt

## 2015-10-11 ENCOUNTER — Encounter: Payer: Self-pay | Admitting: Obstetrics and Gynecology

## 2015-10-11 ENCOUNTER — Other Ambulatory Visit (HOSPITAL_COMMUNITY)
Admission: RE | Admit: 2015-10-11 | Discharge: 2015-10-11 | Disposition: A | Discharge status: Home / Self Care | Payer: Medicare Other | Source: Ambulatory Visit | Attending: Obstetrics and Gynecology | Admitting: Obstetrics and Gynecology

## 2015-10-11 ENCOUNTER — Ambulatory Visit (INDEPENDENT_AMBULATORY_CARE_PROVIDER_SITE_OTHER): Payer: Medicare Other | Admitting: Obstetrics and Gynecology

## 2015-10-11 VITALS — BP 152/70 | Ht 63.0 in | Wt 190.5 lb

## 2015-10-11 DIAGNOSIS — Z853 Personal history of malignant neoplasm of breast: Secondary | ICD-10-CM | POA: Diagnosis not present

## 2015-10-11 DIAGNOSIS — Z01419 Encounter for gynecological examination (general) (routine) without abnormal findings: Secondary | ICD-10-CM

## 2015-10-11 DIAGNOSIS — Z8542 Personal history of malignant neoplasm of other parts of uterus: Secondary | ICD-10-CM | POA: Diagnosis not present

## 2015-10-11 DIAGNOSIS — Z1151 Encounter for screening for human papillomavirus (HPV): Secondary | ICD-10-CM | POA: Insufficient documentation

## 2015-10-11 NOTE — Progress Notes (Signed)
Patient ID: Kayla Thomas, female   DOB: 01/21/1941, 75 y.o.   MRN: [id number]  Assessment:  Annual Gyn Exam    Plan:  1. pap smear done, next pap due in 1 year 2. return annually or prn 3    Annual mammogram advised Subjective:  Kayla Thomas is a 75 y.o. female No obstetric history on file. who presents for annual exam. No LMP recorded. Patient has had a hysterectomy. The patient has no complaints.  Patient has a history of breast cancer and uterine cancer.   The following portions of the patient's history were reviewed and updated as appropriate: allergies, current medications, past family history, past medical history, past social history, past surgical history and problem list. Past Medical History  Diagnosis Date  . Uterine cancer ([place]) 1998  . Chronic back pain   . GERD (gastroesophageal reflux disease) 01/21/2006    EGD Dr Oneida Alar mild chronic gastritis, (NO h pylori) otherwise normal  . Sinus congestion   . Headache(784.0)   . Breast cancer ([place])   . Thumb tendonitis September 2014    left  . Breast cancer ([place])   . Wears partial dentures   . Gastric ulcer     History    Past Surgical History  Procedure Laterality Date  . Cholecystectomy  11/1999  . S/p hysterectomy  1998    uterine ca  . Temperal/mandibular  1974  . Esophagogastroduodenoscopy  01/21/06    mild antral erythema bx h-pylori/normal esophagus without evidence of mass or Barrett's/normal pylorus and duodenum  . Cataracts    . Colonoscopy  05/13/2011    Procedure: COLONOSCOPY;  Surgeon: Dorothyann Peng, MD;  [facility];  Service: Endoscopy;  Laterality: N/A;  8:30  . Tonsillectomy  1964  . Abscess drainage  03-04-05    insect bite  . Cataract extraction w/phaco  10/06/2011    Procedure: CATARACT EXTRACTION PHACO AND INTRAOCULAR LENS PLACEMENT (IOC);  Surgeon: Williams Che, MD;  Location: [facility];  Service: Ophthalmology;  Laterality: Left;  CDE:  5.76  . Abdominal hysterectomy     . Partial mastectomy with needle localization and axillary sentinel lymph node bx Left 08/18/2012    Procedure: PARTIAL MASTECTOMY WITH NEEDLE LOCALIZATION AND AXILLARY SENTINEL LYMPH NODE BX;  Surgeon: Jamesetta So, MD;  Location: [facility];  Service: General;  Laterality: Left;  Need Frozen Section/Sentinel Node Bx @ 8:00am/Needle Loc @ 9:00am  . Trigger finger release Right 05/04/2014    Procedure: RELEASE TRIGGER FINGER/A-1 PULLEY RIGHT RING FINGER;  Surgeon: Daryll Brod, MD;  Location: [place];  Service: Orthopedics;  Laterality: Right;  . Cardiac catheterization N/A 10/31/2014    Procedure: Right/Left Heart Cath and Coronary Angiography;  Surgeon: Troy Sine, MD;  Location: [place] [facility];  Service: Cardiovascular;  Laterality: N/A;     Current outpatient prescriptions:  .  B Complex-C (SUPER B COMPLEX PO), Take 1 tablet by mouth every morning. , Disp: , Rfl:  .  Calcium Carb-Cholecalciferol (CALCIUM 600/VITAMIN D3) 600-800 MG-UNIT TABS, Take 1 capsule by mouth 2 (two) times daily., Disp: , Rfl:  .  diclofenac sodium (VOLTAREN) 1 % GEL, , Disp: , Rfl:  .  Diphenhydramine-PE-APAP 25-5-325 MG TABS, Take 1 tablet by mouth at bedtime., Disp: , Rfl:  .  Glucosamine-Chondroitin (GLUCOSAMINE CHONDR COMPLEX PO), Take 1 tablet by mouth 2 (two) times daily. , Disp: , Rfl:  .  ipratropium (ATROVENT) 0.03 % nasal spray, Place 2 sprays  into the nose every 12 (twelve) hours., Disp: , Rfl:  .  loperamide (IMODIUM) 2 MG capsule, Take 2 mg by mouth 4 (four) times daily as needed. For diarrhea, Disp: , Rfl:  .  LUTEIN PO, Take 45 mg by mouth daily. , Disp: , Rfl:  .  Multiple Vitamins-Minerals (MULTIVITAMINS THER. W/MINERALS) TABS, Take 1 tablet by mouth every morning. , Disp: , Rfl:  .  omeprazole (PRILOSEC) 20 MG capsule, Take 20 mg by mouth 2 (two) times daily before a meal. , Disp: , Rfl:  .  tamoxifen (NOLVADEX) 20 MG tablet, Take 1 tablet (20 mg total) by mouth daily., Disp: 30  tablet, Rfl: 4 .  TURMERIC PO, Take 1 tablet by mouth daily. , Disp: , Rfl:   Review of Systems Constitutional: negative Gastrointestinal: negative Genitourinary: negative A complete 10 system review of systems was obtained and all systems are negative except as noted in the HPI and PMH.    Objective:  BP 152/70 mmHg  Ht 5\' 3"  (1.6 m)  Wt 190 lb 8 oz (86.41 kg)  BMI 33.75 kg/m2   BMI: Body mass index is 33.75 kg/(m^2).  General Appearance: Alert, appropriate appearance for age. No acute distress HEENT: Grossly normal Neck / Thyroid:  Cardiovascular: RRR; normal S1, S2, no murmur Lungs: CTA bilaterally Back: No CVAT Breast Exam: Normal to inspection, Normal breast tissue bilaterally and No masses or nodes.No dimpling, nipple retraction or discharge. Gastrointestinal: Soft, non-tender, no masses or organomegaly Pelvic Exam: External genitalia: normal general appearance Urinary system: urethral meatus normal Vaginal: vaginal shortening and atrophy Cervix: normal appearance Adnexa: normal bimanual exam Uterus: normal single, nontender Rectal: good sphincter tone, no masses and guaiac negative Rectovaginal: not indicated Lymphatic Exam: Non-palpable nodes in neck, clavicular, axillary, or inguinal regions  Skin: no rash or abnormalities, midline vertical incision, well-healed Neurologic: Normal gait and speech, no tremor  Psychiatric: Alert and oriented, appropriate affect.  Urinalysis:Not done  Mallory Shirk. MD Pgr [phone] 10:20 AM   By signing my name below, I, Stephania Fragmin, attest that this documentation has been prepared under the direction and in the presence of Jonnie Kind, MD. Electronically Signed: Stephania Fragmin, ED Scribe. 10/11/2015. 10:27 AM.  I personally performed the services described in this documentation, which was SCRIBED in my presence. The recorded information has been reviewed and considered accurate. It has been edited as necessary during  review. Jonnie Kind, MD

## 2015-10-11 NOTE — Progress Notes (Signed)
Patient ID: Kayla Thomas, female   DOB: November 24, 1940, 75 y.o.   MRN: [id number] Pt here today for annual exam. Pt has Hx of uterine and breast cancer. Pt denies any problems or concerns at this time.

## 2015-10-12 LAB — CYTOLOGY - PAP

## 2015-10-15 ENCOUNTER — Telehealth: Payer: Self-pay | Admitting: Obstetrics and Gynecology

## 2015-10-16 NOTE — Telephone Encounter (Signed)
Efforts to reach patient by phone unsuccessful we'll see patient appointment as scheduled

## 2015-11-01 DIAGNOSIS — Z961 Presence of intraocular lens: Secondary | ICD-10-CM | POA: Diagnosis not present

## 2015-11-01 DIAGNOSIS — H353131 Nonexudative age-related macular degeneration, bilateral, early dry stage: Secondary | ICD-10-CM | POA: Diagnosis not present

## 2015-11-06 DIAGNOSIS — G43101 Migraine with aura, not intractable, with status migrainosus: Secondary | ICD-10-CM | POA: Diagnosis not present

## 2015-11-06 DIAGNOSIS — D070 Carcinoma in situ of endometrium: Secondary | ICD-10-CM | POA: Diagnosis not present

## 2015-11-06 DIAGNOSIS — C50919 Malignant neoplasm of unspecified site of unspecified female breast: Secondary | ICD-10-CM | POA: Diagnosis not present

## 2015-11-06 DIAGNOSIS — M1990 Unspecified osteoarthritis, unspecified site: Secondary | ICD-10-CM | POA: Diagnosis not present

## 2015-11-06 DIAGNOSIS — Z79899 Other long term (current) drug therapy: Secondary | ICD-10-CM | POA: Diagnosis not present

## 2015-11-06 DIAGNOSIS — K219 Gastro-esophageal reflux disease without esophagitis: Secondary | ICD-10-CM | POA: Diagnosis not present

## 2015-11-20 DIAGNOSIS — C541 Malignant neoplasm of endometrium: Secondary | ICD-10-CM | POA: Diagnosis not present

## 2015-11-20 DIAGNOSIS — Z6832 Body mass index (BMI) 32.0-32.9, adult: Secondary | ICD-10-CM | POA: Diagnosis not present

## 2015-11-20 DIAGNOSIS — Z853 Personal history of malignant neoplasm of breast: Secondary | ICD-10-CM | POA: Diagnosis not present

## 2015-11-20 DIAGNOSIS — K219 Gastro-esophageal reflux disease without esophagitis: Secondary | ICD-10-CM | POA: Diagnosis not present

## 2015-11-27 ENCOUNTER — Other Ambulatory Visit (HOSPITAL_COMMUNITY): Payer: Self-pay | Admitting: Hematology & Oncology

## 2015-11-27 DIAGNOSIS — C50412 Malignant neoplasm of upper-outer quadrant of left female breast: Secondary | ICD-10-CM

## 2015-11-28 IMAGING — NM NM BONE WHOLE BODY
4 series · 4 of 4 positions shown · non-contrast
Comparison: None.

CLINICAL DATA: 73-year-old with six-month history of right hip pain
radiating to the right knee. No known injuries. Personal history of
left upper outer quadrant breast cancer in 9850. Personal history of
uterine cancer in 6669.

EXAM:
NUCLEAR MEDICINE WHOLE BODY BONE SCAN
TECHNIQUE: Whole body anterior and posterior images were obtained approximately
3 hours after intravenous injection of radiopharmaceutical.
RADIOPHARMACEUTICALS:  27 mCi 0echnetium-CCm MDP IV

[Series 1: wbr_bone_60 whole body · 2.66mm/px · 1 of 1 slices shown (1 of 2)]
[im 1/1]
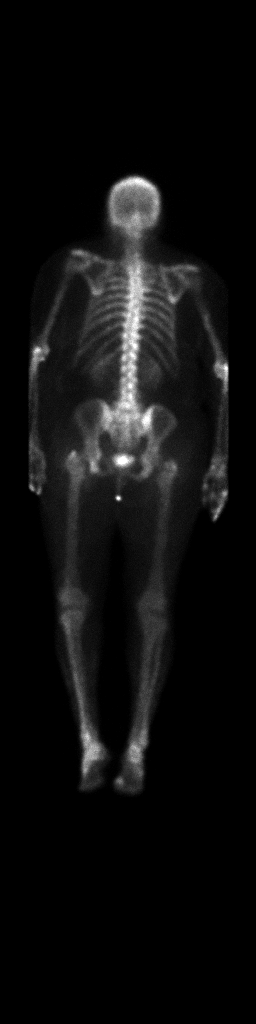

[Series 1: whole body · 2.66mm/px · 1 of 1 slices shown (1 of 2)]
[im 1/1]
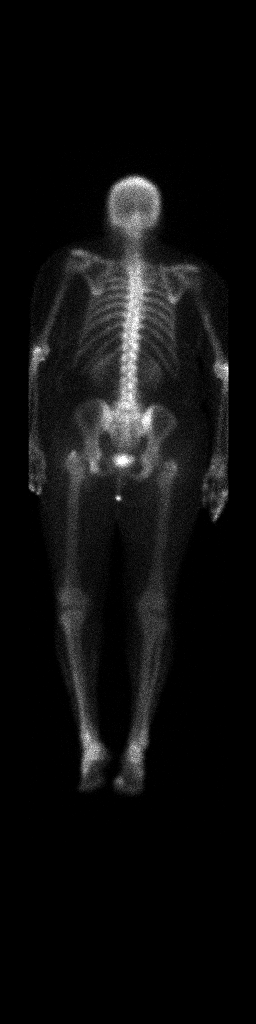

[Series 1: wbr_bone_60 whole body · 2.66mm/px · 1 of 1 slices shown (2 of 2)]
[im 1/1]
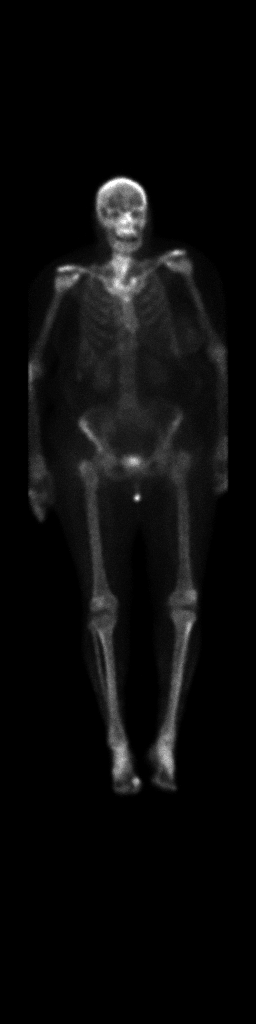

[Series 1: whole body · 2.66mm/px · 1 of 1 slices shown (2 of 2)]
[im 1/1]
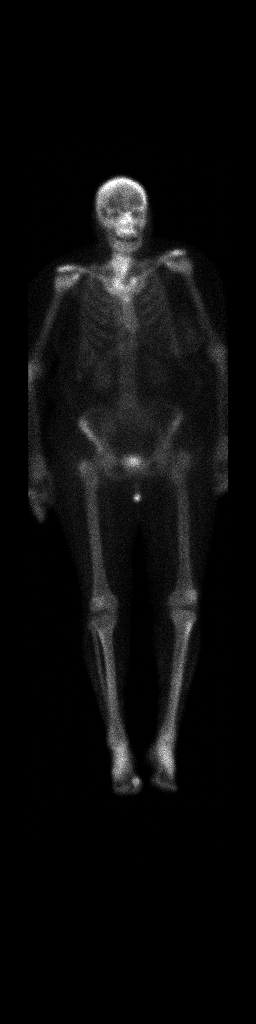

[4 of 4 positions shown; findings below may reference images not displayed]

FINDINGS: No abnormal skeletal activity to suggest metastatic disease.
Degenerative activity involving the sternoclavicular joints,
acromioclavicular joints, the visualized portions of both hands, and
the right 1st MTP joint. Slight increased activity involving the
left ischium relative to the right.

Physiologic excretion of the radiopharmaceutical into the urinary
tract with bladder activity. Extraskeletal activity in the pelvis is
likely due to a small amount of urinary incontinence.
IMPRESSION: 1. Slight increased activity involving the left ischium relative to
the right. Query enthesopathy versus occult fracture. Right hip
x-rays with an AP pelvis x-ray may be helpful in further evaluation.
2. No evidence of osseous metastatic disease.
3. Degenerative activity involving the sternoclavicular joints,
acromioclavicular joints, both hands and right 1st MTP joint.

## 2015-12-04 ENCOUNTER — Ambulatory Visit (HOSPITAL_COMMUNITY)
Admission: RE | Admit: 2015-12-04 | Discharge: 2015-12-04 | Disposition: A | Discharge status: Home / Self Care | Payer: Medicare Other | Source: Ambulatory Visit | Attending: Hematology & Oncology | Admitting: Hematology & Oncology

## 2015-12-04 DIAGNOSIS — Z853 Personal history of malignant neoplasm of breast: Secondary | ICD-10-CM | POA: Diagnosis not present

## 2015-12-04 DIAGNOSIS — C50412 Malignant neoplasm of upper-outer quadrant of left female breast: Secondary | ICD-10-CM

## 2015-12-04 DIAGNOSIS — R928 Other abnormal and inconclusive findings on diagnostic imaging of breast: Secondary | ICD-10-CM | POA: Diagnosis not present

## 2015-12-28 ENCOUNTER — Encounter (HOSPITAL_COMMUNITY): Payer: Medicare Other | Attending: Hematology & Oncology | Admitting: Hematology & Oncology

## 2015-12-28 ENCOUNTER — Encounter (HOSPITAL_COMMUNITY): Payer: Self-pay | Admitting: Hematology & Oncology

## 2015-12-28 VITALS — BP 141/75 | HR 69 | Temp 98.3°F | Resp 18

## 2015-12-28 DIAGNOSIS — M549 Dorsalgia, unspecified: Secondary | ICD-10-CM | POA: Diagnosis not present

## 2015-12-28 DIAGNOSIS — Z8542 Personal history of malignant neoplasm of other parts of uterus: Secondary | ICD-10-CM | POA: Diagnosis not present

## 2015-12-28 DIAGNOSIS — M1611 Unilateral primary osteoarthritis, right hip: Secondary | ICD-10-CM

## 2015-12-28 DIAGNOSIS — R5383 Other fatigue: Secondary | ICD-10-CM

## 2015-12-28 DIAGNOSIS — C50912 Malignant neoplasm of unspecified site of left female breast: Secondary | ICD-10-CM | POA: Diagnosis not present

## 2015-12-28 DIAGNOSIS — C50412 Malignant neoplasm of upper-outer quadrant of left female breast: Secondary | ICD-10-CM

## 2015-12-28 NOTE — Patient Instructions (Signed)
[place] at [facility] Discharge Instructions  RECOMMENDATIONS MADE BY THE CONSULTANT AND ANY TEST RESULTS WILL BE SENT TO YOUR REFERRING PHYSICIAN.  You saw Dr.Penland today. Return to clinic in 6 months. Call the clinic if you have any concerns.  Thank you for choosing [facility] at [facility] to provide your oncology and hematology care.  To afford each patient quality time with our provider, please arrive at least 15 minutes before your scheduled appointment time.   Beginning January 23rd 2017 lab work for the [facility] will be done in the  Main lab at [facility] on 1st floor. If you have a lab appointment with the [facility] please come in thru the  Main Entrance and check in at the main information desk  You need to re-schedule your appointment should you arrive 10 or more minutes late.  We strive to give you quality time with our providers, and arriving late affects you and other patients whose appointments are after yours.  Also, if you no show three or more times for appointments you may be dismissed from the clinic at the providers discretion.     Again, thank you for choosing [facility].  Our hope is that these requests will decrease the amount of time that you wait before being seen by our physicians.       _____________________________________________________________  Should you have questions after your visit to [facility], please contact our office at [phone] between the hours of 8:30 a.m. and 4:30 p.m.  Voicemails left after 4:30 p.m. will not be returned until the following business day.  For prescription refill requests, have your pharmacy contact our office.         Resources For Cancer Patients and their Caregivers ? American Cancer Society: Can assist with transportation, wigs, general needs, runs Look Good Feel Better.        (231)167-8862 ? Cancer Care: Provides  financial assistance, online support groups, medication/co-pay assistance.  1-800-813-HOPE 657-115-7652) ? [place] Assists [place] cancer patients and their families through emotional , educational and financial support.  (585)743-1267 ? [place] DSS Where to apply for food stamps, Medicaid and utility assistance. 4187439006 ? [facility]: Transportation to medical appointments. 540-497-4065 ? Social Security Administration: May apply for disability if have a Stage IV cancer. 763-752-9451 318-425-6306 ? [employer], Disability and Transit Services: Assists with nutrition, care and transit needs. [place] Support Programs: @10RELATIVEDAYS @ > Cancer Support Group  2nd Tuesday of the month 1pm-2pm, Journey Room  > Creative Journey  3rd Tuesday of the month 1130am-1pm, Journey Room  > Look Good Feel Better  1st Wednesday of the month 10am-12 noon, Journey Room (Call [facility] to register [phone])

## 2015-12-28 NOTE — Progress Notes (Signed)
[facility] NOTE  Patient Care Team: Asencion Noble, MD as PCP - General (Internal Medicine) Danie Binder, MD (Gastroenterology) Herminio Commons, MD as Attending Physician (Cardiology)  CHIEF COMPLAINTS/PURPOSE OF CONSULTATION:  Stage I left breast cancer, s/p Lumpectomy, radiotherapy and arimidex Arimidex started on 12/21/2012 History of uterine carcinoma s/p hysterectomy DEXA 12/12/2014 with normal bone density  HISTORY OF PRESENTING ILLNESS:  Kayla Thomas 75 y.o. female is here because of Stage I Left Breast Cancer. She had been on Arimidex but became unable to do her ADLs secondary to significant aching and fatigue. After discussion of risks and benefits she opted to take a break from her Arimidex therapy. She did feel better after discontinuing arimidex with improvement in her energy. She was started on Tamoxifen.  [name] returns to the [facility] today unaccompanied.  She says she's been good. She knows that her mammogram was good. She confirms that she's doing well on Tamoxifen, noting that the only thing is she gets tired easily, but that may be because she's getting older. She notes that she's trying to stay active.  She notes that she doesn't need refills on anything today.  She has a 44 year old grandson that she takes care of during the week and says that he keeps her busy. She notes that he's a handful and he helps keep her active.  During the physical exam, she notes that her appetite has been good, "probably better than it should be."  She notes nothing new in terms of pain, commenting on her arthritis. During the physical exam, she denies any chest pain, confirms that her breathing is good, and confirms that her bowels have been okay.  During her appointment today, she had a breast exam. She notes no problems with her breast that she's noticed.    MEDICAL HISTORY:  Past Medical History  Diagnosis Date   . Uterine cancer ([place]) 1998  . Chronic back pain   . GERD (gastroesophageal reflux disease) 01/21/2006    EGD Dr Oneida Alar mild chronic gastritis, (NO h pylori) otherwise normal  . Sinus congestion   . Headache(784.0)   . Breast cancer ([place])   . Thumb tendonitis September 2014    left  . Breast cancer ([place])   . Wears partial dentures   . Gastric ulcer     History    SURGICAL HISTORY: Past Surgical History  Procedure Laterality Date  . Cholecystectomy  11/1999  . S/p hysterectomy  1998    uterine ca  . Temperal/mandibular  1974  . Esophagogastroduodenoscopy  01/21/06    mild antral erythema bx h-pylori/normal esophagus without evidence of mass or Barrett's/normal pylorus and duodenum  . Cataracts    . Colonoscopy  05/13/2011    Procedure: COLONOSCOPY;  Surgeon: Dorothyann Peng, MD;  [facility];  Service: Endoscopy;  Laterality: N/A;  8:30  . Tonsillectomy  1964  . Abscess drainage  03-04-05    insect bite  . Cataract extraction w/phaco  10/06/2011    Procedure: CATARACT EXTRACTION PHACO AND INTRAOCULAR LENS PLACEMENT (IOC);  Surgeon: Williams Che, MD;  Location: [facility];  Service: Ophthalmology;  Laterality: Left;  CDE:  5.76  . Abdominal hysterectomy    . Partial mastectomy with needle localization and axillary sentinel lymph node bx Left 08/18/2012    Procedure: PARTIAL MASTECTOMY WITH NEEDLE LOCALIZATION AND AXILLARY SENTINEL LYMPH NODE BX;  Surgeon: Jamesetta So, MD;  Location: [facility];  Service: General;  Laterality: Left;  Need Frozen Section/Sentinel Node Bx @ 8:00am/Needle Loc @ 9:00am  . Trigger finger release Right 05/04/2014    Procedure: RELEASE TRIGGER FINGER/A-1 PULLEY RIGHT RING FINGER;  Surgeon: Daryll Brod, MD;  Location: [facility];  Service: Orthopedics;  Laterality: Right;  . Cardiac catheterization N/A 10/31/2014    Procedure: Right/Left Heart Cath and Coronary Angiography;  Surgeon: Troy Sine, MD;  Location: [facility];   Service: Cardiovascular;  Laterality: N/A;    SOCIAL HISTORY: Social History   Social History  . Marital Status: Widowed    Spouse Name: N/A  . Number of Children: 1  . Years of Education: N/A   Occupational History  . retired; Facilities manager asst    Social History Main Topics  . Smoking status: Never Smoker   . Smokeless tobacco: Never Used  . Alcohol Use: No  . Drug Use: No  . Sexual Activity: Not Currently    Birth Control/ Protection: Surgical   Other Topics Concern  . Not on file   Social History Narrative   1 adopted son-grown   Lives alone    FAMILY HISTORY: Family History  Problem Relation Age of Onset  . Liver cancer Father   . Alcohol abuse Father   . Hypertension Mother   . COPD Mother   . Diabetes Brother    indicated that her mother is deceased. She indicated that her father is deceased. She indicated that both of her brothers are alive.   ALLERGIES:  is allergic to aspirin; iron; sulfa antibiotics; and ancef.  MEDICATIONS:  Current Outpatient Prescriptions  Medication Sig Dispense Refill  . B Complex-C (SUPER B COMPLEX PO) Take 1 tablet by mouth every morning.     . Calcium Carb-Cholecalciferol (CALCIUM 600/VITAMIN D3) 600-800 MG-UNIT TABS Take 1 capsule by mouth 2 (two) times daily.    . diclofenac sodium (VOLTAREN) 1 % GEL     . Diphenhydramine-PE-APAP 25-5-325 MG TABS Take 1 tablet by mouth at bedtime.    . Glucosamine-Chondroitin (GLUCOSAMINE CHONDR COMPLEX PO) Take 1 tablet by mouth 2 (two) times daily.     Marland Kitchen ipratropium (ATROVENT) 0.03 % nasal spray Place 2 sprays into the nose every 12 (twelve) hours.    Marland Kitchen loperamide (IMODIUM) 2 MG capsule Take 2 mg by mouth 4 (four) times daily as needed. For diarrhea    . LUTEIN PO Take 45 mg by mouth daily.     . Multiple Vitamins-Minerals (MULTIVITAMINS THER. W/MINERALS) TABS Take 1 tablet by mouth every morning.     Marland Kitchen omeprazole (PRILOSEC) 20 MG capsule Take 20 mg by mouth 2 (two) times daily before a meal.      . tamoxifen (NOLVADEX) 20 MG tablet Take 1 tablet (20 mg total) by mouth daily. 30 tablet 4  . TURMERIC PO Take 1 tablet by mouth daily.      No current facility-administered medications for this visit.    Review of Systems  Constitutional: Negative. Musculoskeletal: Positive for joint pain, chronic arthritis, but general joint aching improved since discontinuation of arimidex      Osteoarthritis, "aches". Right hip pain.  14 point ROS was done and is otherwise as detailed above or in HPI    PHYSICAL EXAMINATION: ECOG PERFORMANCE STATUS: 1 - Symptomatic but completely ambulatory  Filed Vitals:   12/28/15 1125  BP: 141/75  Pulse: 69  Temp: 98.3 F (36.8 C)  Resp: 18   Physical Exam  Constitutional: She is oriented to person, place, and time and well-developed, well-nourished, and in no distress.  HENT:  Head: Normocephalic and atraumatic.  Nose: Nose normal.  Mouth/Throat: Oropharynx is clear and moist. No oropharyngeal exudate.  Eyes: Conjunctivae and EOM are normal. Pupils are equal, round, and reactive to light. Right eye exhibits no discharge. Left eye exhibits no discharge. No scleral icterus.  Neck: Normal range of motion. Neck supple. No tracheal deviation present. No thyromegaly present.  Cardiovascular: Normal rate, regular rhythm and normal heart sounds.  Exam reveals no gallop and no friction rub.   No murmur heard. Pulmonary/Chest: Effort normal and breath sounds normal. She has no wheezes. She has no rales.  Breast: L nipple, chronically inverted; radiation changes on the L breast; prominent in the L axilla Incision site at about the 2 o'clock position. Lots of scar tissue at the incision site; tenderness at the incision site as well. R breast unremarkable, no palpable abnormalities. No suspicious skin changes. No R axillary adenopathy. Abdominal: Soft. Bowel sounds are normal. She exhibits no distension and no mass. There is no tenderness. There is no rebound and  no guarding.  Musculoskeletal: Range of motion decreased due to right hip pain. She exhibits no edema. significant OA of the hands Lymphadenopathy:    She has no cervical adenopathy.  Neurological: She is alert and oriented to person, place, and time. She has normal reflexes. No cranial nerve deficit. Gait normal. Coordination normal.  Skin: Skin is warm and dry. No rash noted.    Psychiatric: Mood, memory, affect and judgment normal.  Nursing note and vitals reviewed.   LABORATORY DATA:  I have reviewed the data as listed Lab Results  Component Value Date   WBC 7.3 11/30/2014   HGB 13.7 11/30/2014   HCT 39.7 11/30/2014   MCV 94.1 11/30/2014   PLT 208 11/30/2014   CMP     Component Value Date/Time   NA 139 11/30/2014 1220   K 4.3 11/30/2014 1220   CL 104 11/30/2014 1220   CO2 26 11/30/2014 1220   GLUCOSE 90 11/30/2014 1220   BUN 17 11/30/2014 1220   CREATININE 0.75 11/30/2014 1220   CREATININE 0.81 10/30/2014 0931   CALCIUM 9.2 11/30/2014 1220   PROT 6.7 11/30/2014 1220   ALBUMIN 4.3 11/30/2014 1220   AST 22 11/30/2014 1220   ALT 29 11/30/2014 1220   ALKPHOS 120 11/30/2014 1220   BILITOT 0.5 11/30/2014 1220   GFRNONAA >60 11/30/2014 1220   GFRAA >60 11/30/2014 1220    RADIOLOGY:   Study Result     CLINICAL DATA: History of treated left breast cancer, status post lumpectomy in February 2014.  EXAM: 2D DIGITAL DIAGNOSTIC BILATERAL MAMMOGRAM WITH CAD AND ADJUNCT TOMO  COMPARISON: Previous exam(s).  ACR Breast Density Category b: There are scattered areas of fibroglandular density.  FINDINGS: There are no suspicious masses, areas of nonsurgical architectural distortion or microcalcifications in either breast. Expected post lumpectomy changes are seen and the left breast upper outer quadrant. Postsurgical changes from left axillary dissection are also noted.  Mammographic images were processed with CAD.  IMPRESSION: No mammographic evidence of  malignancy in either breast status post left lumpectomy.  RECOMMENDATION: Diagnostic mammogram is suggested in 1 year. (Code:DM-B-01Y)  I have discussed the findings and recommendations with the patient. Results were also provided in writing at the conclusion of the visit. If applicable, a reminder letter will be sent to the patient regarding the next appointment.  BI-RADS CATEGORY 2:  Benign.   Electronically Signed  By: Fidela Salisbury M.D.  On: 12/04/2015 10:19     ASSESSMENT & PLAN:  Stage I left breast cancer, s/p Lumpectomy, radiotherapy and arimidex Arimidex started on 12/21/2012 History of uterine carcinoma Arimidex induced joint pain and fatigue Back pain, OA Tamoxifen started 06/11/2015  She is doing much better on Tamoxifen. Plan is for continuation of current therapy. She is up to date on her breast exam and mammogram.  We reviewed her bone scan . She does not wish to proceed with additional imaging for her ongoing hip/back pain.   It is not improving but she notes it is not worse and it is not limiting her ADL's.   She will be due for repeat mammography in one year.   She had a breast exam during her appointment today.  She does not need any refills today.  RTC 6 months.  All questions were answered. The patient knows to call the clinic with any problems, questions or concerns.   This document serves as a record of services personally performed by Ancil Linsey, MD. It was created on her behalf by Toni Amend, a trained medical scribe. The creation of this record is based on the scribe's personal observations and the provider's statements to them. This document has been checked and approved by the attending provider.  I have reviewed the above documentation for accuracy and completeness, and I agree with the above.  This note was electronically signed.   Patrici Ranks, MD

## 2016-02-26 DIAGNOSIS — M20011 Mallet finger of right finger(s): Secondary | ICD-10-CM | POA: Diagnosis not present

## 2016-02-26 DIAGNOSIS — S62602A Fracture of unspecified phalanx of right middle finger, initial encounter for closed fracture: Secondary | ICD-10-CM | POA: Diagnosis not present

## 2016-02-27 DIAGNOSIS — S62662A Nondisplaced fracture of distal phalanx of right middle finger, initial encounter for closed fracture: Secondary | ICD-10-CM | POA: Diagnosis not present

## 2016-02-27 DIAGNOSIS — M19041 Primary osteoarthritis, right hand: Secondary | ICD-10-CM | POA: Insufficient documentation

## 2016-02-27 DIAGNOSIS — M79644 Pain in right finger(s): Secondary | ICD-10-CM | POA: Diagnosis not present

## 2016-03-10 DIAGNOSIS — M19041 Primary osteoarthritis, right hand: Secondary | ICD-10-CM | POA: Diagnosis not present

## 2016-03-10 DIAGNOSIS — S62662A Nondisplaced fracture of distal phalanx of right middle finger, initial encounter for closed fracture: Secondary | ICD-10-CM | POA: Diagnosis not present

## 2016-03-31 ENCOUNTER — Other Ambulatory Visit (HOSPITAL_COMMUNITY): Payer: Self-pay | Admitting: Hematology & Oncology

## 2016-03-31 DIAGNOSIS — C50412 Malignant neoplasm of upper-outer quadrant of left female breast: Secondary | ICD-10-CM

## 2016-03-31 DIAGNOSIS — Z23 Encounter for immunization: Secondary | ICD-10-CM | POA: Diagnosis not present

## 2016-04-09 DIAGNOSIS — S62662A Nondisplaced fracture of distal phalanx of right middle finger, initial encounter for closed fracture: Secondary | ICD-10-CM | POA: Diagnosis not present

## 2016-04-09 DIAGNOSIS — M25641 Stiffness of right hand, not elsewhere classified: Secondary | ICD-10-CM | POA: Diagnosis not present

## 2016-05-07 DIAGNOSIS — M19041 Primary osteoarthritis, right hand: Secondary | ICD-10-CM | POA: Diagnosis not present

## 2016-05-09 DIAGNOSIS — M722 Plantar fascial fibromatosis: Secondary | ICD-10-CM | POA: Diagnosis not present

## 2016-05-09 DIAGNOSIS — B351 Tinea unguium: Secondary | ICD-10-CM | POA: Diagnosis not present

## 2016-05-09 DIAGNOSIS — M7730 Calcaneal spur, unspecified foot: Secondary | ICD-10-CM | POA: Diagnosis not present

## 2016-05-27 DIAGNOSIS — M722 Plantar fascial fibromatosis: Secondary | ICD-10-CM | POA: Diagnosis not present

## 2016-05-27 DIAGNOSIS — M79673 Pain in unspecified foot: Secondary | ICD-10-CM | POA: Diagnosis not present

## 2016-05-27 DIAGNOSIS — B351 Tinea unguium: Secondary | ICD-10-CM | POA: Diagnosis not present

## 2016-06-04 DIAGNOSIS — M19041 Primary osteoarthritis, right hand: Secondary | ICD-10-CM | POA: Diagnosis not present

## 2016-06-26 DIAGNOSIS — B351 Tinea unguium: Secondary | ICD-10-CM | POA: Diagnosis not present

## 2016-06-26 DIAGNOSIS — M722 Plantar fascial fibromatosis: Secondary | ICD-10-CM | POA: Diagnosis not present

## 2016-06-26 DIAGNOSIS — M79673 Pain in unspecified foot: Secondary | ICD-10-CM | POA: Diagnosis not present

## 2016-07-03 ENCOUNTER — Encounter (HOSPITAL_COMMUNITY): Payer: Self-pay | Admitting: Hematology & Oncology

## 2016-07-03 ENCOUNTER — Encounter (HOSPITAL_COMMUNITY): Payer: Medicare Other | Attending: Hematology & Oncology | Admitting: Hematology & Oncology

## 2016-07-03 VITALS — BP 116/63 | HR 80 | Temp 98.1°F | Resp 18 | Wt 181.2 lb

## 2016-07-03 DIAGNOSIS — Z8542 Personal history of malignant neoplasm of other parts of uterus: Secondary | ICD-10-CM

## 2016-07-03 DIAGNOSIS — C50912 Malignant neoplasm of unspecified site of left female breast: Secondary | ICD-10-CM | POA: Diagnosis not present

## 2016-07-03 DIAGNOSIS — M1611 Unilateral primary osteoarthritis, right hip: Secondary | ICD-10-CM | POA: Diagnosis not present

## 2016-07-03 DIAGNOSIS — Z170 Estrogen receptor positive status [ER+]: Secondary | ICD-10-CM

## 2016-07-03 DIAGNOSIS — R5383 Other fatigue: Secondary | ICD-10-CM

## 2016-07-03 DIAGNOSIS — Z79810 Long term (current) use of selective estrogen receptor modulators (SERMs): Secondary | ICD-10-CM

## 2016-07-03 DIAGNOSIS — Z79899 Other long term (current) drug therapy: Secondary | ICD-10-CM

## 2016-07-03 DIAGNOSIS — M549 Dorsalgia, unspecified: Secondary | ICD-10-CM | POA: Diagnosis not present

## 2016-07-03 DIAGNOSIS — Z5181 Encounter for therapeutic drug level monitoring: Secondary | ICD-10-CM

## 2016-07-03 DIAGNOSIS — C55 Malignant neoplasm of uterus, part unspecified: Secondary | ICD-10-CM

## 2016-07-03 DIAGNOSIS — C50412 Malignant neoplasm of upper-outer quadrant of left female breast: Secondary | ICD-10-CM

## 2016-07-03 MED ORDER — AZITHROMYCIN 250 MG PO TABS: ORAL_TABLET | ORAL | 0 refills | Status: DC

## 2016-07-03 NOTE — Patient Instructions (Signed)
[facility] at [facility] Discharge Instructions  RECOMMENDATIONS MADE BY THE CONSULTANT AND ANY TEST RESULTS WILL BE SENT TO YOUR REFERRING PHYSICIAN.  You were seen today by Dr. Whitney Muse. Zpak given today. Get Neti Pot to help with head congestion. Get your Dexa scan in June. Return in 6 months for follow up.   Thank you for choosing [place] at [facility] to provide your oncology and hematology care.  To afford each patient quality time with our provider, please arrive at least 15 minutes before your scheduled appointment time.    If you have a lab appointment with the [facility] please come in thru the  Main Entrance and check in at the main information desk  You need to re-schedule your appointment should you arrive 10 or more minutes late.  We strive to give you quality time with our providers, and arriving late affects you and other patients whose appointments are after yours.  Also, if you no show three or more times for appointments you may be dismissed from the clinic at the providers discretion.     Again, thank you for choosing [facility].  Our hope is that these requests will decrease the amount of time that you wait before being seen by our physicians.       _____________________________________________________________  Should you have questions after your visit to [facility], please contact our office at [phone] between the hours of 8:30 a.m. and 4:30 p.m.  Voicemails left after 4:30 p.m. will not be returned until the following business day.  For prescription refill requests, have your pharmacy contact our office.       Resources For Cancer Patients and their Caregivers ? American Cancer Society: Can assist with transportation, wigs, general needs, runs Look Good Feel Better.        219 832 9778 ? Cancer Care: Provides financial assistance, online support groups, medication/co-pay  assistance.  1-800-813-HOPE (949)201-4057) ? [facility] Assists [place] cancer patients and their families through emotional , educational and financial support.  (908) 727-6909 ? [place] DSS Where to apply for food stamps, Medicaid and utility assistance. (910)525-4917 ? [facility]: Transportation to medical appointments. (910)187-1168 ? Social Security Administration: May apply for disability if have a Stage IV cancer. 9125238503 (825)327-5301 ? [employer], Disability and Transit Services: Assists with nutrition, care and transit needs. [place] Support Programs: @10RELATIVEDAYS @ > Cancer Support Group  2nd Tuesday of the month 1pm-2pm, Journey Room  > Creative Journey  3rd Tuesday of the month 1130am-1pm, Journey Room  > Look Good Feel Better  1st Wednesday of the month 10am-12 noon, Journey Room (Call [facility] to register [phone])

## 2016-07-03 NOTE — Progress Notes (Signed)
[facility] NOTE  Patient Care Team: Asencion Noble, MD as PCP - General (Internal Medicine) Danie Binder, MD (Gastroenterology) Herminio Commons, MD as Attending Physician (Cardiology)  CHIEF COMPLAINTS/PURPOSE OF CONSULTATION:  Stage I left breast cancer, s/p Lumpectomy, radiotherapy and arimidex Arimidex started on 12/21/2012 History of uterine carcinoma s/p hysterectomy DEXA 12/12/2014 with normal bone density  HISTORY OF PRESENTING ILLNESS:  Kayla Thomas 76 y.o. female is here because of Stage I Left Breast Cancer. She had been on Arimidex but became unable to do her ADLs secondary to significant aching and fatigue. After discussion of risks and benefits she opted to take a break from her Arimidex therapy. She did feel better after discontinuing arimidex with improvement in her energy. She was started on Tamoxifen.  Kayla Thomas returns to the [facility] today unaccompanied.  States she is up to date on her mammogram.   States it has been 5 years since she had a colonoscopy and was concerned if she should go ahead and get another one done.   She states she doesn't need refills on any medications.  Feeling good but thinks she has a sinus infection which started last week. She denies fever or chills. She is sleeping well.  Stated she had the flu the week before Christmas. She has received her flu vaccine this year.  Oncologically she denies any problems She takes her tamoxifen daily.  MEDICAL HISTORY:  Past Medical History:  Diagnosis Date  . Breast cancer ([place])   . Breast cancer ([place])   . Chronic back pain   . Gastric ulcer    History  . GERD (gastroesophageal reflux disease) 01/21/2006   EGD Dr Oneida Alar mild chronic gastritis, (NO h pylori) otherwise normal  . Headache(784.0)   . Sinus congestion   . Thumb tendonitis September 2014   left  . Uterine cancer ([place]) 1998  . Wears partial dentures     SURGICAL  HISTORY: Past Surgical History:  Procedure Laterality Date  . ABDOMINAL HYSTERECTOMY    . ABSCESS DRAINAGE  03-04-05   insect bite  . CARDIAC CATHETERIZATION N/A 10/31/2014   Procedure: Right/Left Heart Cath and Coronary Angiography;  Surgeon: Troy Sine, MD;  Location: [facility];  Service: Cardiovascular;  Laterality: N/A;  . CATARACT EXTRACTION W/PHACO  10/06/2011   Procedure: CATARACT EXTRACTION PHACO AND INTRAOCULAR LENS PLACEMENT (IOC);  Surgeon: Williams Che, MD;  Location: [facility];  Service: Ophthalmology;  Laterality: Left;  CDE:  5.76  . cataracts    . CHOLECYSTECTOMY  11/1999  . COLONOSCOPY  05/13/2011   Procedure: COLONOSCOPY;  Surgeon: Dorothyann Peng, MD;  [facility];  Service: Endoscopy;  Laterality: N/A;  8:30  . ESOPHAGOGASTRODUODENOSCOPY  01/21/06   mild antral erythema bx h-pylori/normal esophagus without evidence of mass or Barrett's/normal pylorus and duodenum  . PARTIAL MASTECTOMY WITH NEEDLE LOCALIZATION AND AXILLARY SENTINEL LYMPH NODE BX Left 08/18/2012   Procedure: PARTIAL MASTECTOMY WITH NEEDLE LOCALIZATION AND AXILLARY SENTINEL LYMPH NODE BX;  Surgeon: Jamesetta So, MD;  Location: [facility];  Service: General;  Laterality: Left;  Need Frozen Section/Sentinel Node Bx @ 8:00am/Needle Loc @ 9:00am  . S/P Hysterectomy  1998   uterine ca  . temperal/mandibular  1974  . TONSILLECTOMY  1964  . TRIGGER FINGER RELEASE Right 05/04/2014   Procedure: RELEASE TRIGGER FINGER/A-1 PULLEY RIGHT RING FINGER;  Surgeon: Daryll Brod, MD;  Location: [facility];  Service: Orthopedics;  Laterality: Right;    SOCIAL HISTORY: Social History   Social History  . Marital status: Widowed    Spouse name: N/A  . Number of children: 1  . Years of education: N/A   Occupational History  . retired; Facilities manager asst Retired   Social History Main Topics  . Smoking status: Never Smoker  . Smokeless tobacco: Never Used  . Alcohol use No  . Drug use: No  .  Sexual activity: Not Currently    Birth control/ protection: Surgical   Other Topics Concern  . Not on file   Social History Narrative   1 adopted son-grown   Lives alone    FAMILY HISTORY: Family History  Problem Relation Age of Onset  . Liver cancer Father   . Alcohol abuse Father   . Hypertension Mother   . COPD Mother   . Diabetes Brother    indicated that her mother is deceased. She indicated that her father is deceased. She indicated that both of her brothers are alive.    ALLERGIES:  is allergic to aspirin; iron; sulfa antibiotics; sulfasalazine; and ancef [cefazolin].  MEDICATIONS:  Current Outpatient Prescriptions  Medication Sig Dispense Refill  . ciclopirox (PENLAC) 8 % solution Apply topically 2 (two) times daily. Apply over nail and surrounding skin. Apply daily over previous coat. After seven (7) days, may remove with alcohol and continue cycle.    Marland Kitchen azithromycin (ZITHROMAX) 250 MG tablet Take 2 tablets on day 1 then one tablet daily thereafter until gone 11 each 0  . B Complex-C (SUPER B COMPLEX PO) Take 1 tablet by mouth every morning.     . Calcium Carb-Cholecalciferol (CALCIUM 600/VITAMIN D3) 600-800 MG-UNIT TABS Take 1 capsule by mouth 2 (two) times daily.    . diclofenac sodium (VOLTAREN) 1 % GEL     . Diphenhydramine-PE-APAP 25-5-325 MG TABS Take 1 tablet by mouth at bedtime.    . Glucosamine-Chondroitin (GLUCOSAMINE CHONDR COMPLEX PO) Take 1 tablet by mouth 2 (two) times daily.     Marland Kitchen ipratropium (ATROVENT) 0.03 % nasal spray Place 2 sprays into the nose every 12 (twelve) hours.    Marland Kitchen loperamide (IMODIUM) 2 MG capsule Take 2 mg by mouth 4 (four) times daily as needed. For diarrhea    . Lutein 20 MG CAPS Take by mouth.    . Multiple Vitamins-Minerals (MULTIVITAMINS THER. W/MINERALS) TABS Take 1 tablet by mouth every morning.     Marland Kitchen omeprazole (PRILOSEC) 20 MG capsule Take 20 mg by mouth 2 (two) times daily before a meal.     . tamoxifen (NOLVADEX) 20 MG  tablet TAKE ONE TABLET BY MOUTH DAILY. 30 tablet 0  . TURMERIC PO Take 1 tablet by mouth daily.      No current facility-administered medications for this visit.      Review of Systems  Constitutional: Negative.   HENT: Negative.        Sinus infection  Eyes: Negative.   Respiratory: Negative.   Cardiovascular: Negative.   Gastrointestinal: Negative.   Genitourinary: Negative.   Musculoskeletal: Negative.        Osteoarthritis  Skin: Negative.   Neurological: Negative.   Endo/Heme/Allergies: Negative.   Psychiatric/Behavioral: Negative.   All other systems reviewed and are negative.   PHYSICAL EXAMINATION: ECOG PERFORMANCE STATUS: 1 - Symptomatic but completely ambulatory  Vitals:   07/03/16 1128  BP: 116/63  Pulse: 80  Resp: 18  Temp: 98.1 F (36.7 C)  Physical Exam  Constitutional: She is oriented to person, place, and time and well-developed, well-nourished, and in no distress.  HENT:  Head: Normocephalic and atraumatic.  Eyes: Conjunctivae and EOM are normal. Pupils are equal, round, and reactive to light. No scleral icterus.  Neck: Normal range of motion. Neck supple.  Cardiovascular: Normal rate, regular rhythm and normal heart sounds.   Pulmonary/Chest: Effort normal and breath sounds normal.  Abdominal: Soft. Bowel sounds are normal. She exhibits no distension and no mass. There is no tenderness. There is no rebound and no guarding.  Musculoskeletal: Normal range of motion. She exhibits no edema.  Lymphadenopathy:    She has no cervical adenopathy.  Neurological: She is alert and oriented to person, place, and time. Gait normal.  Skin: Skin is warm and dry.  Psychiatric: Mood, memory, affect and judgment normal.  Nursing note and vitals reviewed.    LABORATORY DATA:  I have reviewed the data as listed Lab Results  Component Value Date   WBC 7.3 11/30/2014   HGB 13.7 11/30/2014   HCT 39.7 11/30/2014   MCV 94.1 11/30/2014   PLT 208 11/30/2014    CMP     Component Value Date/Time   NA 139 11/30/2014 1220   K 4.3 11/30/2014 1220   CL 104 11/30/2014 1220   CO2 26 11/30/2014 1220   GLUCOSE 90 11/30/2014 1220   BUN 17 11/30/2014 1220   CREATININE 0.75 11/30/2014 1220   CREATININE 0.81 10/30/2014 0931   CALCIUM 9.2 11/30/2014 1220   PROT 6.7 11/30/2014 1220   ALBUMIN 4.3 11/30/2014 1220   AST 22 11/30/2014 1220   ALT 29 11/30/2014 1220   ALKPHOS 120 11/30/2014 1220   BILITOT 0.5 11/30/2014 1220   GFRNONAA >60 11/30/2014 1220   GFRAA >60 11/30/2014 1220    RADIOLOGY:   Study Result     CLINICAL DATA: History of treated left breast cancer, status post lumpectomy in February 2014.  EXAM: 2D DIGITAL DIAGNOSTIC BILATERAL MAMMOGRAM WITH CAD AND ADJUNCT TOMO  COMPARISON: Previous exam(s).  ACR Breast Density Category b: There are scattered areas of fibroglandular density.  FINDINGS: There are no suspicious masses, areas of nonsurgical architectural distortion or microcalcifications in either breast. Expected post lumpectomy changes are seen and the left breast upper outer quadrant. Postsurgical changes from left axillary dissection are also noted.  Mammographic images were processed with CAD.  IMPRESSION: No mammographic evidence of malignancy in either breast status post left lumpectomy.  RECOMMENDATION: Diagnostic mammogram is suggested in 1 year. (Code:DM-B-01Y)  I have discussed the findings and recommendations with the patient. Results were also provided in writing at the conclusion of the visit. If applicable, a reminder letter will be sent to the patient regarding the next appointment.  BI-RADS CATEGORY 2: Benign.   Electronically Signed  By: Fidela Salisbury M.D.  On: 12/04/2015 10:19     ASSESSMENT & PLAN:  Stage I left breast cancer, s/p Lumpectomy, radiotherapy and arimidex Arimidex started on 12/21/2012 History of uterine carcinoma Arimidex induced joint pain and  fatigue Back pain, OA Tamoxifen started 06/11/2015  She is doing much better on Tamoxifen. Plan is for continuation of current therapy x 5 years. She is up to date on her breast exam and mammogram.   She will be due for repeat mammography next year.    I advised her to treat her sinus symptoms symptomatically. She should try saline rinses and decongestants. If symptoms worsen to follow-up with me or her PCP.   Last  C-scope was in 2012.  She does not need any refills today.  She will need a bone density scan in June.  She will follow up with our clinic in 6 months.  All questions were answered. The patient knows to call the clinic with any problems, questions or concerns.  This document serves as a record of services personally performed by Ancil Linsey, MD. It was created on her behalf by Shirlean Mylar, a trained medical scribe. The creation of this record is based on the scribe's personal observations and the provider's statements to them. This document has been checked and approved by the attending provider.  I have reviewed the above documentation for accuracy and completeness, and I agree with the above.  This note was electronically signed.   Patrici Ranks, MD

## 2016-07-24 ENCOUNTER — Encounter (HOSPITAL_COMMUNITY): Payer: Self-pay | Admitting: Hematology & Oncology

## 2016-08-05 ENCOUNTER — Other Ambulatory Visit (HOSPITAL_COMMUNITY): Payer: Self-pay | Admitting: Hematology & Oncology

## 2016-08-05 DIAGNOSIS — C50412 Malignant neoplasm of upper-outer quadrant of left female breast: Secondary | ICD-10-CM

## 2016-09-25 DIAGNOSIS — M722 Plantar fascial fibromatosis: Secondary | ICD-10-CM | POA: Diagnosis not present

## 2016-09-25 DIAGNOSIS — M79673 Pain in unspecified foot: Secondary | ICD-10-CM | POA: Diagnosis not present

## 2016-10-16 ENCOUNTER — Other Ambulatory Visit: Payer: Medicare Other | Admitting: Obstetrics and Gynecology

## 2016-11-05 ENCOUNTER — Encounter: Payer: Self-pay | Admitting: Obstetrics and Gynecology

## 2016-11-05 ENCOUNTER — Ambulatory Visit (INDEPENDENT_AMBULATORY_CARE_PROVIDER_SITE_OTHER): Payer: Medicare HMO | Admitting: Obstetrics and Gynecology

## 2016-11-05 ENCOUNTER — Other Ambulatory Visit (HOSPITAL_COMMUNITY)
Admission: RE | Admit: 2016-11-05 | Discharge: 2016-11-05 | Disposition: A | Discharge status: Home / Self Care | Payer: Medicare HMO | Source: Ambulatory Visit | Attending: Obstetrics and Gynecology | Admitting: Obstetrics and Gynecology

## 2016-11-05 VITALS — BP 138/86 | HR 78 | Ht 64.5 in | Wt 187.0 lb

## 2016-11-05 DIAGNOSIS — Z01419 Encounter for gynecological examination (general) (routine) without abnormal findings: Secondary | ICD-10-CM | POA: Insufficient documentation

## 2016-11-05 DIAGNOSIS — C50412 Malignant neoplasm of upper-outer quadrant of left female breast: Secondary | ICD-10-CM

## 2016-11-05 DIAGNOSIS — Z8542 Personal history of malignant neoplasm of other parts of uterus: Secondary | ICD-10-CM

## 2016-11-05 NOTE — Progress Notes (Signed)
Patient ID: Kayla Thomas, female   DOB: 08-29-1940, 75 y.o.   MRN: [id number]   Assessment:  Annual Gyn Exam Pigmented nevus right labia majora  S/p Ca Breast left S.p Ca uterus with radiation tx after hysterectomy 1998 neg f/u x 20 yrs. Vaginal stenosis s/p Rad tx. Plan:  1. pap smear done 2. return annually or prn 3    Annual mammogram advised 4. F/u in 1 month for excision of vulvar pigmented nevus   Subjective:   Chief Complaint  Patient presents with  . Gynecologic Exam     Kayla Thomas is a 76 y.o. female [place] who presents for annual exam. No LMP recorded. Patient has had a hysterectomy. The patient has no complaints today. She denies problems with bowel movements.   The following portions of the patient's history were reviewed and updated as appropriate: allergies, current medications, past family history, past medical history, past social history, past surgical history and problem list. Past Medical History:  Diagnosis Date  . Breast cancer ([place])   . Breast cancer ([place])   . Chronic back pain   . Gastric ulcer    History  . GERD (gastroesophageal reflux disease) 01/21/2006   EGD Dr Oneida Alar mild chronic gastritis, (NO h pylori) otherwise normal  . Headache(784.0)   . Sinus congestion   . Thumb tendonitis September 2014   left  . Uterine cancer ([place]) 1998  . Wears partial dentures     Past Surgical History:  Procedure Laterality Date  . ABDOMINAL HYSTERECTOMY    . ABSCESS DRAINAGE  03-04-05   insect bite  . CARDIAC CATHETERIZATION N/A 10/31/2014   Procedure: Right/Left Heart Cath and Coronary Angiography;  Surgeon: Troy Sine, MD;  Location: [facility];  Service: Cardiovascular;  Laterality: N/A;  . CATARACT EXTRACTION W/PHACO  10/06/2011   Procedure: CATARACT EXTRACTION PHACO AND INTRAOCULAR LENS PLACEMENT (IOC);  Surgeon: Williams Che, MD;  [facility]: [facility];  Service: Ophthalmology;  Laterality: Left;  CDE:  5.76  . cataracts    .  CHOLECYSTECTOMY  11/1999  . COLONOSCOPY  05/13/2011   Procedure: COLONOSCOPY;  Surgeon: Dorothyann Peng, MD;  [facility];  Service: Endoscopy;  Laterality: N/A;  8:30  . ESOPHAGOGASTRODUODENOSCOPY  01/21/06   mild antral erythema bx h-pylori/normal esophagus without evidence of mass or Barrett's/normal pylorus and duodenum  . PARTIAL MASTECTOMY WITH NEEDLE LOCALIZATION AND AXILLARY SENTINEL LYMPH NODE BX Left 08/18/2012   Procedure: PARTIAL MASTECTOMY WITH NEEDLE LOCALIZATION AND AXILLARY SENTINEL LYMPH NODE BX;  Surgeon: Jamesetta So, MD;  Location[facility];  Service: General;  Laterality: Left;  Need Frozen Section/Sentinel Node Bx @ 8:00am/Needle Loc @ 9:00am  . S/P Hysterectomy  1998   uterine ca  . temperal/mandibular  1974  . TONSILLECTOMY  1964  . TRIGGER FINGER RELEASE Right 05/04/2014   Procedure: RELEASE TRIGGER FINGER/A-1 PULLEY RIGHT RING FINGER;  Surgeon: Daryll Brod, MD;  Location: [facility];  Service: Orthopedics;  Laterality: Right;     Current Outpatient Prescriptions:  .  B Complex-C (SUPER B COMPLEX PO), Take 1 tablet by mouth every morning. , Disp: , Rfl:  .  Calcium Carb-Cholecalciferol (CALCIUM 600/VITAMIN D3) 600-800 MG-UNIT TABS, Take 1 capsule by mouth 2 (two) times daily., Disp: , Rfl:  .  ciclopirox (PENLAC) 8 % solution, Apply topically 2 (two) times daily. Apply over nail and surrounding skin. Apply daily over previous coat. After seven (7) days, may remove with alcohol  and continue cycle., Disp: , Rfl:  .  diclofenac sodium (VOLTAREN) 1 % GEL, , Disp: , Rfl:  .  Diphenhydramine-PE-APAP 25-5-325 MG TABS, Take 1 tablet by mouth at bedtime., Disp: , Rfl:  .  Glucosamine-Chondroitin (GLUCOSAMINE CHONDR COMPLEX PO), Take 1 tablet by mouth 2 (two) times daily. , Disp: , Rfl:  .  ipratropium (ATROVENT) 0.03 % nasal spray, Place 2 sprays into the nose every 12 (twelve) hours., Disp: , Rfl:  .  loperamide (IMODIUM) 2 MG capsule, Take 2 mg by mouth  4 (four) times daily as needed. For diarrhea, Disp: , Rfl:  .  Lutein 20 MG CAPS, Take by mouth., Disp: , Rfl:  .  Multiple Vitamins-Minerals (MULTIVITAMINS THER. W/MINERALS) TABS, Take 1 tablet by mouth every morning. , Disp: , Rfl:  .  omeprazole (PRILOSEC) 20 MG capsule, Take 20 mg by mouth 2 (two) times daily before a meal. , Disp: , Rfl:  .  tamoxifen (NOLVADEX) 20 MG tablet, TAKE ONE TABLET BY MOUTH DAILY., Disp: 30 tablet, Rfl: 5 .  TURMERIC PO, Take 1 tablet by mouth daily. , Disp: , Rfl:   Review of Systems Constitutional: negative Gastrointestinal: negative Genitourinary: negative   Objective:  BP 138/86 (BP Location: Right Arm, Patient Position: Sitting, Cuff Size: Normal)   Pulse 78   Ht 5' 4.5" (1.638 m)   Wt 187 lb (84.8 kg)   BMI 31.60 kg/m    BMI: Body mass index is 31.6 kg/m.  General Appearance: Alert, appropriate appearance for age. No acute distress HEENT: Grossly normal Neck / Thyroid:  Cardiovascular: RRR; normal S1, S2, no murmur Lungs: CTA bilaterally Back: No CVAT Breast Exam: No masses or nodes.No dimpling, nipple retraction or discharge. Post surgical changes upper outer quadrant of the left breast.  Gastrointestinal: Soft, non-tender, no masses or organomegaly Pelvic Exam:  External genitalia: 1 cm pigmented nevus on the right labia majora that appears unchanged with smooth margin. The right side of the nevus has a 33mm x 5 mm area of dark pigmentation  Vaginal: Vaginal stenosis s/p radiation tx. Shortened vagina about 3 cm in length with well healed cuff.  Rectovaginal: normal rectal, no masses and guaiac negative stool obtained Lymphatic Exam: Non-palpable nodes in neck, clavicular, axillary, or inguinal regions  Skin: no rash or abnormalities Neurologic: Normal gait and speech, no tremor  Psychiatric: Alert and oriented, appropriate affect.  Urinalysis:Not done  Guaiac negative   Mallory Shirk. MD Pgr [phone] 12:31 PM    By signing my  name below, I, Hansel Feinstein, attest that this documentation has been prepared under the direction and in the presence of Jonnie Kind, MD. Electronically Signed: Hansel Feinstein, ED Scribe. 11/05/16. 12:31 PM.  I personally performed the services described in this documentation, which was SCRIBED in my presence. The recorded information has been reviewed and considered accurate. It has been edited as necessary during review. Jonnie Kind, MD

## 2016-11-06 LAB — CYTOLOGY - PAP
DIAGNOSIS: NEGATIVE
HPV: NOT DETECTED

## 2016-11-07 ENCOUNTER — Telehealth: Payer: Self-pay | Admitting: *Deleted

## 2016-11-07 NOTE — Telephone Encounter (Signed)
Patient informed normal pap.

## 2016-11-19 DIAGNOSIS — C50919 Malignant neoplasm of unspecified site of unspecified female breast: Secondary | ICD-10-CM | POA: Diagnosis not present

## 2016-11-19 DIAGNOSIS — K219 Gastro-esophageal reflux disease without esophagitis: Secondary | ICD-10-CM | POA: Diagnosis not present

## 2016-11-19 DIAGNOSIS — E785 Hyperlipidemia, unspecified: Secondary | ICD-10-CM | POA: Diagnosis not present

## 2016-11-19 DIAGNOSIS — G43919 Migraine, unspecified, intractable, without status migrainosus: Secondary | ICD-10-CM | POA: Diagnosis not present

## 2016-11-19 DIAGNOSIS — Z79899 Other long term (current) drug therapy: Secondary | ICD-10-CM | POA: Diagnosis not present

## 2016-11-27 DIAGNOSIS — Z0001 Encounter for general adult medical examination with abnormal findings: Secondary | ICD-10-CM | POA: Diagnosis not present

## 2016-11-27 DIAGNOSIS — Z6833 Body mass index (BMI) 33.0-33.9, adult: Secondary | ICD-10-CM | POA: Diagnosis not present

## 2016-11-27 DIAGNOSIS — K219 Gastro-esophageal reflux disease without esophagitis: Secondary | ICD-10-CM | POA: Diagnosis not present

## 2016-11-27 DIAGNOSIS — R7301 Impaired fasting glucose: Secondary | ICD-10-CM | POA: Diagnosis not present

## 2016-12-04 ENCOUNTER — Telehealth (HOSPITAL_COMMUNITY): Payer: Self-pay

## 2016-12-04 ENCOUNTER — Other Ambulatory Visit (HOSPITAL_COMMUNITY): Payer: Self-pay | Admitting: Oncology

## 2016-12-04 DIAGNOSIS — C50412 Malignant neoplasm of upper-outer quadrant of left female breast: Secondary | ICD-10-CM

## 2016-12-04 DIAGNOSIS — Z170 Estrogen receptor positive status [ER+]: Principal | ICD-10-CM

## 2016-12-04 NOTE — Telephone Encounter (Signed)
Amy,   Can you help me with this one too?  Thanks, MB

## 2016-12-04 NOTE — Telephone Encounter (Signed)
Order is in.  Amy can help with scheduling.  TK

## 2016-12-04 NOTE — Telephone Encounter (Signed)
Patient called because she had tried to schedule her mammogram but was told she needed a doctors order. Her last mammogram was December 04, 2015. Her next follow up appt is 01/01/17. Explained to patient I would get provider to order and then she will be called to schedule it. She verbalized understanding.

## 2016-12-08 ENCOUNTER — Ambulatory Visit (INDEPENDENT_AMBULATORY_CARE_PROVIDER_SITE_OTHER): Payer: Medicare HMO | Admitting: Obstetrics and Gynecology

## 2016-12-08 VITALS — BP 120/72 | HR 50 | Wt 187.6 lb

## 2016-12-08 DIAGNOSIS — D280 Benign neoplasm of vulva: Secondary | ICD-10-CM | POA: Diagnosis not present

## 2016-12-08 DIAGNOSIS — N9089 Other specified noninflammatory disorders of vulva and perineum: Secondary | ICD-10-CM

## 2016-12-08 DIAGNOSIS — L821 Other seborrheic keratosis: Secondary | ICD-10-CM | POA: Diagnosis not present

## 2016-12-08 NOTE — Addendum Note (Signed)
Addended by: Octaviano Glow on: 12/08/2016 12:49 PM   Modules accepted: Orders

## 2016-12-08 NOTE — Progress Notes (Signed)
Patient ID: Kayla Thomas, female   DOB: September 23, 1940, 76 y.o.   MRN: [id number]     Chief Complaint  Patient presents with  . excision of vulvar pigmented nevus    HPI Kayla Thomas is a 76 y.o. female. She is here for excision of pigmented vulvar lesion on the right labia majora noted at her last visit  Indication: pigmented nevus of the vulva Symptoms:   none  Location:  labia majora on the right      Past Medical History:  Diagnosis Date  . Breast cancer ([facility])   . Breast cancer ([facility])   . Chronic back pain   . Gastric ulcer    History  . GERD (gastroesophageal reflux disease) 01/21/2006   EGD Dr Darrick Penna mild chronic gastritis, (NO h pylori) otherwise normal  . Headache(784.0)   . Sinus congestion   . Thumb tendonitis September 2014   left  . Uterine cancer ([facility]) 1998  . Wears partial dentures          Past Surgical History:  Procedure Laterality Date  . ABDOMINAL HYSTERECTOMY    . ABSCESS DRAINAGE  03-04-05   insect bite  . CARDIAC CATHETERIZATION N/A 10/31/2014   Procedure: Right/Left Heart Cath and Coronary Angiography;  Surgeon: Lennette Bihari, MD;  Location: [facility];  Service: Cardiovascular;  Laterality: N/A;  . CATARACT EXTRACTION W/PHACO  10/06/2011   Procedure: CATARACT EXTRACTION PHACO AND INTRAOCULAR LENS PLACEMENT (IOC);  Surgeon: Susa Simmonds, MD;  [facility];  Service: Ophthalmology;  Laterality: Left;  CDE:  5.76  . cataracts    . CHOLECYSTECTOMY  11/1999  . COLONOSCOPY  05/13/2011   Procedure: COLONOSCOPY;  Surgeon: Arlyce Harman, MD;  [facility];  Service: Endoscopy;  Laterality: N/A;  8:30  . ESOPHAGOGASTRODUODENOSCOPY  01/21/06   mild antral erythema bx h-pylori/normal esophagus without evidence of mass or Barrett's/normal pylorus and duodenum  . PARTIAL MASTECTOMY WITH NEEDLE LOCALIZATION AND AXILLARY SENTINEL LYMPH NODE BX Left 08/18/2012   Procedure: PARTIAL MASTECTOMY WITH  NEEDLE LOCALIZATION AND AXILLARY SENTINEL LYMPH NODE BX;  Surgeon: [name], MD;  [facility];  Service: General;  Laterality: Left;  Need Frozen Section/Sentinel Node Bx @ 8:00am/Needle Loc @ 9:00am  . S/P Hysterectomy  1998   uterine ca  . temperal/mandibular  1974  . TONSILLECTOMY  1964  . TRIGGER FINGER RELEASE Right 05/04/2014   Procedure: RELEASE TRIGGER FINGER/A-1 PULLEY RIGHT RING FINGER;  Surgeon: Cindee Salt, MD;  [facility];  Service: Orthopedics;  Laterality: Right;         Family History  Problem Relation Age of Onset  . Liver cancer Father   . Alcohol abuse Father   . Hypertension Mother   . COPD Mother   . Diabetes Brother     Social History     Social History  Substance Use Topics  . Smoking status: Never Smoker  . Smokeless tobacco: Never Used  . Alcohol use No         Allergies  Allergen Reactions  . Aspirin Other (See Comments)    REACTION: Abdominal pain   . Iron Anaphylaxis    REACTION: abdominal pain   . Sulfa Antibiotics Rash    Rash all over  . Sulfasalazine Rash    Rash all over  . Ancef [Cefazolin] Rash          Current Outpatient Prescriptions  Medication Sig Dispense Refill  . B  Complex-C (SUPER B COMPLEX PO) Take 1 tablet by mouth every morning.     . Calcium Carb-Cholecalciferol (CALCIUM 600/VITAMIN D3) 600-800 MG-UNIT TABS Take 1 capsule by mouth 2 (two) times daily.    . ciclopirox (PENLAC) 8 % solution Apply topically 2 (two) times daily. Apply over nail and surrounding skin. Apply daily over previous coat. After seven (7) days, may remove with alcohol and continue cycle.    . diclofenac sodium (VOLTAREN) 1 % GEL     . Diphenhydramine-PE-APAP 25-5-325 MG TABS Take 1 tablet by mouth at bedtime.    . Glucosamine-Chondroitin (GLUCOSAMINE CHONDR COMPLEX PO) Take 1 tablet by mouth 2 (two) times daily.     Marland Kitchen ipratropium (ATROVENT) 0.03 % nasal spray Place 2  sprays into the nose every 12 (twelve) hours.    Marland Kitchen loperamide (IMODIUM) 2 MG capsule Take 2 mg by mouth 4 (four) times daily as needed. For diarrhea    . Lutein 20 MG CAPS Take by mouth.    . Multiple Vitamins-Minerals (MULTIVITAMINS THER. W/MINERALS) TABS Take 1 tablet by mouth every morning.     Marland Kitchen omeprazole (PRILOSEC) 20 MG capsule Take 20 mg by mouth 2 (two) times daily before a meal.     . tamoxifen (NOLVADEX) 20 MG tablet TAKE ONE TABLET BY MOUTH DAILY. 30 tablet 5  . TURMERIC PO Take 1 tablet by mouth daily.      No current facility-administered medications for this visit.     Review of Systems Review of Systems  Blood pressure 120/72, pulse (!) 50, weight 187 lb 9.6 oz (85.1 kg).  Physical Exam Physical Exam:  1 cm pigmented raised lesion to the right labia majora lateral to clitoris   Data Reviewed    Hx of uterine CA   Assessment    Prepping with Betadine Local anesthesia with 1% Buffered Lidocaine x 3 ccs. Ellipse of skin sharply excised 2 cm length. Excisional biopsy performed and then the wound closed 2 layer closure 4-0 Vicryl subcutaneous and then epithelial skin closure with 4-0 Prolene which will be removed in 2 weeks Well tolerated  Specimen appropriately identified and sent to pathology  Plan    Follow-up:  2 weeks for suture removal      Kayla Thomas V 12/08/2016, 9:27 AM    By signing my name below, I, Freida Busman, attest that this documentation has been prepared under the direction and in the presence of [name], MD . Electronically Signed: Freida Busman, Scribe. 12/08/2016. 9:29 AM. I personally performed the services described in this documentation, which was SCRIBED in my presence. The recorded information has been reviewed and considered accurate. It has been edited as necessary during review. [name], MD

## 2016-12-12 ENCOUNTER — Ambulatory Visit (HOSPITAL_COMMUNITY)
Admission: RE | Admit: 2016-12-12 | Discharge: 2016-12-12 | Disposition: A | Discharge status: Home / Self Care | Payer: Medicare HMO | Source: Ambulatory Visit | Attending: Hematology & Oncology | Admitting: Hematology & Oncology

## 2016-12-12 DIAGNOSIS — E2839 Other primary ovarian failure: Secondary | ICD-10-CM | POA: Diagnosis not present

## 2016-12-12 DIAGNOSIS — Z79899 Other long term (current) drug therapy: Secondary | ICD-10-CM | POA: Insufficient documentation

## 2016-12-12 DIAGNOSIS — Z5181 Encounter for therapeutic drug level monitoring: Secondary | ICD-10-CM | POA: Insufficient documentation

## 2016-12-15 ENCOUNTER — Telehealth: Payer: Self-pay | Admitting: *Deleted

## 2016-12-15 NOTE — Telephone Encounter (Signed)
Pt informed that lesion was benign.

## 2016-12-16 ENCOUNTER — Encounter (HOSPITAL_COMMUNITY): Payer: Medicare HMO

## 2016-12-22 ENCOUNTER — Encounter: Payer: Self-pay | Admitting: Obstetrics and Gynecology

## 2016-12-22 ENCOUNTER — Ambulatory Visit (INDEPENDENT_AMBULATORY_CARE_PROVIDER_SITE_OTHER): Payer: Medicare HMO | Admitting: Obstetrics and Gynecology

## 2016-12-22 DIAGNOSIS — N9089 Other specified noninflammatory disorders of vulva and perineum: Secondary | ICD-10-CM | POA: Diagnosis not present

## 2016-12-22 NOTE — Progress Notes (Signed)
   Subjective:  Kayla Thomas is a 76 y.o. female now 2 weeks status post excision of pigmented vulvar lesion on the right labia majora. She returns today for suture removal.    Review of Systems Negative    The patient is not having any pain.  Objective:  BP 124/64 (BP Location: Right Arm, Patient Position: Sitting, Cuff Size: Normal)   Pulse 72   Wt 187 lb (84.8 kg)   BMI 31.60 kg/m  General:Well developed, well nourished.  No acute distress. Abdomen: Bowel sounds normal, soft, non-tender. Pelvic Exam:    External Genitalia:  Normal.    Vagina: Normal  Incision(s):   Healing, no drainage, no erythema, no hernia, no swelling, no dehiscence, Procedure: Three sutures removed from the right labia majora. Pt tolerated it well.   Assessment:  Post-Op 2 weeks s/p excision of pigmented vulvar lesion on the right labia majora  Doing well postoperatively.   Plan:  1.Wound care discussed   2. . current medications.none 3. Follow up PRN    By signing my name below, I, Evelene Croon, attest that this documentation has been prepared under the direction and in the presence of Jonnie Kind, MD . Electronically Signed: Evelene Croon, Scribe. 12/22/2016. 10:24 AM. I personally performed the services described in this documentation, which was SCRIBED in my presence. The recorded information has been reviewed and considered accurate. It has been edited as necessary during review. Jonnie Kind, MD

## 2016-12-23 ENCOUNTER — Ambulatory Visit (HOSPITAL_COMMUNITY)
Admission: RE | Admit: 2016-12-23 | Discharge: 2016-12-23 | Disposition: A | Discharge status: Home / Self Care | Payer: Medicare HMO | Source: Ambulatory Visit | Attending: Oncology | Admitting: Oncology

## 2016-12-23 DIAGNOSIS — C50412 Malignant neoplasm of upper-outer quadrant of left female breast: Secondary | ICD-10-CM | POA: Diagnosis not present

## 2016-12-23 DIAGNOSIS — Z170 Estrogen receptor positive status [ER+]: Secondary | ICD-10-CM | POA: Insufficient documentation

## 2016-12-23 DIAGNOSIS — R922 Inconclusive mammogram: Secondary | ICD-10-CM | POA: Diagnosis not present

## 2017-01-01 ENCOUNTER — Encounter (HOSPITAL_COMMUNITY): Payer: Self-pay

## 2017-01-01 ENCOUNTER — Ambulatory Visit (HOSPITAL_COMMUNITY): Payer: Medicare Other

## 2017-01-01 ENCOUNTER — Encounter (HOSPITAL_COMMUNITY): Payer: Medicare Other | Attending: Hematology & Oncology | Admitting: Oncology

## 2017-01-01 VITALS — BP 143/65 | HR 66 | Resp 16 | Ht 62.5 in | Wt 188.0 lb

## 2017-01-01 DIAGNOSIS — Z8542 Personal history of malignant neoplasm of other parts of uterus: Secondary | ICD-10-CM | POA: Diagnosis not present

## 2017-01-01 DIAGNOSIS — C50419 Malignant neoplasm of upper-outer quadrant of unspecified female breast: Secondary | ICD-10-CM

## 2017-01-01 DIAGNOSIS — R197 Diarrhea, unspecified: Secondary | ICD-10-CM

## 2017-01-01 DIAGNOSIS — Z79810 Long term (current) use of selective estrogen receptor modulators (SERMs): Secondary | ICD-10-CM | POA: Diagnosis not present

## 2017-01-01 DIAGNOSIS — C50912 Malignant neoplasm of unspecified site of left female breast: Secondary | ICD-10-CM

## 2017-01-01 DIAGNOSIS — Z170 Estrogen receptor positive status [ER+]: Principal | ICD-10-CM

## 2017-01-01 NOTE — Progress Notes (Signed)
[facility] NOTE  Patient Care Team: Asencion Noble, MD as PCP - General (Internal Medicine) Danie Binder, MD (Gastroenterology) Herminio Commons, MD as Attending Physician (Cardiology)  CHIEF COMPLAINTS/PURPOSE OF CONSULTATION:  Stage I left breast cancer, s/p Lumpectomy, radiotherapy and arimidex Arimidex started on 12/21/2012 History of uterine carcinoma s/p hysterectomy DEXA 12/12/2014 with normal bone density  HISTORY OF PRESENTING ILLNESS:  Kayla Thomas 76 y.o. female is here because of Stage I Left Breast Cancer. She had been on Arimidex but became unable to do her ADLs secondary to significant aching and fatigue. After discussion of risks and benefits she opted to take a break from her Arimidex therapy. She did feel better after discontinuing arimidex with improvement in her energy. She was started on Tamoxifen.  Patient presents today for continued follow-up. Patient a bilateral diagnostic mammogram on 12/23/16 which was BI-RADS 2 without evidence of malignancy in either breast. She states that she has been tolerating tamoxifen well except for occasional hot flashes. She continues to have chronic diarrhea which she's had for years after she received radiation for her uterine cancer.  Otherwise ROS was negative.   MEDICAL HISTORY:  Past Medical History:  Diagnosis Date  . Breast cancer ([place])   . Breast cancer ([place])   . Chronic back pain   . Gastric ulcer    History  . GERD (gastroesophageal reflux disease) 01/21/2006   EGD Dr Oneida Alar mild chronic gastritis, (NO h pylori) otherwise normal  . Headache(784.0)   . Sinus congestion   . Thumb tendonitis September 2014   left  . Uterine cancer ([place]) 1998  . Wears partial dentures     SURGICAL HISTORY: Past Surgical History:  Procedure Laterality Date  . ABDOMINAL HYSTERECTOMY    . ABSCESS DRAINAGE  03-04-05   insect bite  . CARDIAC CATHETERIZATION N/A 10/31/2014   Procedure: Right/Left Heart Cath and Coronary Angiography;  Surgeon: Troy Sine, MD;  Location: [facility];  Service: Cardiovascular;  Laterality: N/A;  . CATARACT EXTRACTION W/PHACO  10/06/2011   Procedure: CATARACT EXTRACTION PHACO AND INTRAOCULAR LENS PLACEMENT (IOC);  Surgeon: Williams Che, MD;  Location: [facility];  Service: Ophthalmology;  Laterality: Left;  CDE:  5.76  . cataracts    . CHOLECYSTECTOMY  11/1999  . COLONOSCOPY  05/13/2011   Procedure: COLONOSCOPY;  Surgeon: Dorothyann Peng, MD;  Location: [facility];  Service: Endoscopy;  Laterality: N/A;  8:30  . ESOPHAGOGASTRODUODENOSCOPY  01/21/06   mild antral erythema bx h-pylori/normal esophagus without evidence of mass or Barrett's/normal pylorus and duodenum  . PARTIAL MASTECTOMY WITH NEEDLE LOCALIZATION AND AXILLARY SENTINEL LYMPH NODE BX Left 08/18/2012   Procedure: PARTIAL MASTECTOMY WITH NEEDLE LOCALIZATION AND AXILLARY SENTINEL LYMPH NODE BX;  Surgeon: Jamesetta So, MD;  Location: [facility];  Service: General;  Laterality: Left;  Need Frozen Section/Sentinel Node Bx @ 8:00am/Needle Loc @ 9:00am  . S/P Hysterectomy  1998   uterine ca  . temperal/mandibular  1974  . TONSILLECTOMY  1964  . TRIGGER FINGER RELEASE Right 05/04/2014   Procedure: RELEASE TRIGGER FINGER/A-1 PULLEY RIGHT RING FINGER;  Surgeon: Daryll Brod, MD;  Location: [facility];  Service: Orthopedics;  Laterality: Right;    SOCIAL HISTORY: Social History   Social History  . Marital status: Widowed    Spouse name: N/A  . Number of children: 1  . Years of education: N/A   Occupational History  .  retired; Facilities manager asst Retired   Social History Main Topics  . Smoking status: Never Smoker  . Smokeless tobacco: Never Used  . Alcohol use No  . Drug use: No  . Sexual activity: Not Currently    Birth control/ protection: Surgical   Other Topics Concern  . Not on file   Social History Narrative   1 adopted son-grown   Lives alone     FAMILY HISTORY: Family History  Problem Relation Age of Onset  . Liver cancer Father   . Alcohol abuse Father   . Hypertension Mother   . COPD Mother   . Diabetes Brother    indicated that her mother is deceased. She indicated that her father is deceased. She indicated that both of her brothers are alive.    ALLERGIES:  is allergic to aspirin; iron; sulfa antibiotics; sulfasalazine; and ancef [cefazolin].  MEDICATIONS:  Current Outpatient Prescriptions  Medication Sig Dispense Refill  . B Complex-C (SUPER B COMPLEX PO) Take 1 tablet by mouth every morning.     . Calcium Carb-Cholecalciferol (CALCIUM 600/VITAMIN D3) 600-800 MG-UNIT TABS Take 1 capsule by mouth 2 (two) times daily.    . ciclopirox (PENLAC) 8 % solution Apply topically 2 (two) times daily. Apply over nail and surrounding skin. Apply daily over previous coat. After seven (7) days, may remove with alcohol and continue cycle.    . diclofenac sodium (VOLTAREN) 1 % GEL     . Diphenhydramine-PE-APAP 25-5-325 MG TABS Take 1 tablet by mouth at bedtime.    . Glucosamine-Chondroitin (GLUCOSAMINE CHONDR COMPLEX PO) Take 1 tablet by mouth 2 (two) times daily.     Marland Kitchen ipratropium (ATROVENT) 0.03 % nasal spray Place 2 sprays into the nose every 12 (twelve) hours.    Marland Kitchen loperamide (IMODIUM) 2 MG capsule Take 2 mg by mouth 4 (four) times daily as needed. For diarrhea    . Lutein 20 MG CAPS Take by mouth.    . Multiple Vitamins-Minerals (MULTIVITAMINS THER. W/MINERALS) TABS Take 1 tablet by mouth every morning.     Marland Kitchen omeprazole (PRILOSEC) 20 MG capsule Take 20 mg by mouth 2 (two) times daily before a meal.     . tamoxifen (NOLVADEX) 20 MG tablet TAKE ONE TABLET BY MOUTH DAILY. 30 tablet 5  . TURMERIC PO Take 1 tablet by mouth daily.      No current facility-administered medications for this visit.      Review of Systems  Constitutional: Negative.   HENT: Negative.        Sinus infection  Eyes: Negative.   Respiratory:  Negative.   Cardiovascular: Negative.   Gastrointestinal: Positive for diarrhea.  Genitourinary: Negative.   Musculoskeletal: Negative.   Skin: Negative.   Neurological: Negative.   Endo/Heme/Allergies: Negative.   Psychiatric/Behavioral: Negative.   All other systems reviewed and are negative.   PHYSICAL EXAMINATION: ECOG PERFORMANCE STATUS: 1 - Symptomatic but completely ambulatory  Vitals:   01/01/17 1108  BP: (!) 143/65  Pulse: 66  Resp: 16     Physical Exam  Constitutional: She is oriented to person, place, and time and well-developed, well-nourished, and in no distress.  HENT:  Head: Normocephalic and atraumatic.  Eyes: Pupils are equal, round, and reactive to light. Conjunctivae and EOM are normal. No scleral icterus.  Neck: Normal range of motion. Neck supple.  Cardiovascular: Normal rate, regular rhythm and normal heart sounds.   Pulmonary/Chest: Effort normal and breath sounds normal.  Abdominal: Soft. Bowel sounds are normal. She exhibits  no distension and no mass. There is no tenderness. There is no rebound and no guarding.  Musculoskeletal: Normal range of motion. She exhibits no edema.  Lymphadenopathy:    She has no cervical adenopathy.  Neurological: She is alert and oriented to person, place, and time. Gait normal.  Skin: Skin is warm and dry.  Psychiatric: Mood, memory, affect and judgment normal.  Nursing note and vitals reviewed. Breast exam was not performed today.  LABORATORY DATA:  I have reviewed the data as listed Lab Results  Component Value Date   WBC 7.3 11/30/2014   HGB 13.7 11/30/2014   HCT 39.7 11/30/2014   MCV 94.1 11/30/2014   PLT 208 11/30/2014   CMP     Component Value Date/Time   NA 139 11/30/2014 1220   K 4.3 11/30/2014 1220   CL 104 11/30/2014 1220   CO2 26 11/30/2014 1220   GLUCOSE 90 11/30/2014 1220   BUN 17 11/30/2014 1220   CREATININE 0.75 11/30/2014 1220   CREATININE 0.81 10/30/2014 0931   CALCIUM 9.2 11/30/2014  1220   PROT 6.7 11/30/2014 1220   ALBUMIN 4.3 11/30/2014 1220   AST 22 11/30/2014 1220   ALT 29 11/30/2014 1220   ALKPHOS 120 11/30/2014 1220   BILITOT 0.5 11/30/2014 1220   GFRNONAA >60 11/30/2014 1220   GFRAA >60 11/30/2014 1220    RADIOLOGY:   Study Result     CLINICAL DATA: History of treated left breast cancer, status post lumpectomy in February 2014.  EXAM: 2D DIGITAL DIAGNOSTIC BILATERAL MAMMOGRAM WITH CAD AND ADJUNCT TOMO  COMPARISON: Previous exam(s).  ACR Breast Density Category b: There are scattered areas of fibroglandular density.  FINDINGS: There are no suspicious masses, areas of nonsurgical architectural distortion or microcalcifications in either breast. Expected post lumpectomy changes are seen and the left breast upper outer quadrant. Postsurgical changes from left axillary dissection are also noted.  Mammographic images were processed with CAD.  IMPRESSION: No mammographic evidence of malignancy in either breast status post left lumpectomy.  RECOMMENDATION: Diagnostic mammogram is suggested in 1 year. (Code:DM-B-01Y)  I have discussed the findings and recommendations with the patient. Results were also provided in writing at the conclusion of the visit. If applicable, a reminder letter will be sent to the patient regarding the next appointment.  BI-RADS CATEGORY 2: Benign.   Electronically Signed  By: Fidela Salisbury M.D.  On: 12/04/2015 10:19     ASSESSMENT & PLAN:  Stage I left breast cancer, s/p Lumpectomy, radiotherapy and arimidex Arimidex started on 12/21/2012 History of uterine carcinoma Arimidex induced joint pain and fatigue Back pain, OA Tamoxifen started 06/11/2015  Clinically NED. Continue tamoxifen through July 2019, at which point she will complete 5 years of endocrine therapy. Breast exam was deferred today since she recently had a negative mammogram on 12/23/16. Perform breast exam on next  visit. Repeat annual mammogram in July 2019. RTC in 6 months for f/u with labs.  Orders Placed This Encounter  Procedures  . CBC with Differential    Standing Status:   Future    Standing Expiration Date:   01/01/2018  . Comprehensive metabolic panel    Standing Status:   Future    Standing Expiration Date:   01/01/2018   Twana First, MD

## 2017-01-09 DIAGNOSIS — M19041 Primary osteoarthritis, right hand: Secondary | ICD-10-CM | POA: Diagnosis not present

## 2017-01-20 ENCOUNTER — Telehealth: Payer: Self-pay

## 2017-01-20 NOTE — Telephone Encounter (Signed)
Gastroenterology Pre-Procedure Review  Request Date: Requesting Physician:  Dr. Willey Blade  Last TCS 05/13/11 by Dr. Oneida Alar  PATIENT REVIEW QUESTIONS: The patient responded to the following health history questions as indicated:    1. Diabetes Melitis: NO 2. Joint replacements in the past 12 months: NO 3. Major health problems in the past 3 months: NO 4. Has an artificial valve or MVP: NO 5. Has a defibrillator: NO 6. Has been advised in past to take antibiotics in advance of a procedure like teeth cleaning: NO 7. Family history of colon cancer: NO  8. Alcohol Use: NO 9. History of sleep apnea: NO  10. History of coronary artery or other vascular stents placed within the last 12 months: NO 11. History of any prior anesthesia complications: NO    MEDICATIONS & ALLERGIES:    Patient reports the following regarding taking any blood thinners:   Plavix? NO Aspirin? NO Coumadin? NO Brilinta? NO Xarelto? NO Eliquis? NO Pradaxa? NO Savaysa? NO Effient? NO  Patient confirms/reports the following medications:  Current Outpatient Prescriptions  Medication Sig Dispense Refill  . B Complex-C (SUPER B COMPLEX PO) Take 1 tablet by mouth every morning.     . Calcium Carb-Cholecalciferol (CALCIUM 600/VITAMIN D3) 600-800 MG-UNIT TABS Take 1 capsule by mouth 2 (two) times daily.    . ciclopirox (PENLAC) 8 % solution Apply topically 2 (two) times daily. Apply over nail and surrounding skin. Apply daily over previous coat. After seven (7) days, may remove with alcohol and continue cycle.    . diclofenac sodium (VOLTAREN) 1 % GEL as needed.     . Diphenhydramine-PE-APAP 25-5-325 MG TABS Take 1 tablet by mouth at bedtime.    . Glucosamine-Chondroitin (GLUCOSAMINE CHONDR COMPLEX PO) Take 1 tablet by mouth 2 (two) times daily.     Marland Kitchen ipratropium (ATROVENT) 0.03 % nasal spray Place 2 sprays into the nose as needed.     . loperamide (IMODIUM) 2 MG capsule Take 2 mg by mouth 4 (four) times daily as needed.  For diarrhea    . Lutein 20 MG CAPS Take by mouth daily.     . Multiple Vitamins-Minerals (MULTIVITAMINS THER. W/MINERALS) TABS Take 1 tablet by mouth every morning.     Marland Kitchen omeprazole (PRILOSEC) 20 MG capsule Take 20 mg by mouth 2 (two) times daily before a meal.     . tamoxifen (NOLVADEX) 20 MG tablet TAKE ONE TABLET BY MOUTH DAILY. 30 tablet 5  . TURMERIC PO Take 1 tablet by mouth daily.      No current facility-administered medications for this visit.     Patient confirms/reports the following allergies:  Allergies  Allergen Reactions  . Aspirin Other (See Comments)    REACTION: Abdominal pain   . Iron Anaphylaxis    REACTION: abdominal pain   . Sulfa Antibiotics Rash    Rash all over  . Sulfasalazine Rash    Rash all over  . Ancef [Cefazolin] Rash    No orders of the defined types were placed in this encounter.   AUTHORIZATION INFORMATION Primary Insurance: [facility],  ID #: [id number],  Group #: [id number] Pre-Cert / Josem Kaufmann required:  Pre-Cert / Auth #:    SCHEDULE INFORMATION: Procedure has been scheduled as follows:  Date: , Time:   Location:   This Gastroenterology Pre-Precedure Review Form is being routed to the following provider(s): Barney Drain, MD

## 2017-01-21 NOTE — Telephone Encounter (Signed)
REVIEWED. 2 SIMPLE ADENOMAS REMOVED IN 2012.  MOVI PREP SPLIT DOSING, FULL LIQUIDS WITH BREAKFAST.  Full Liquid Diet A high-calorie, high-protein supplement should be used to meet your nutritional requirements when the full liquid diet is continued for more than 2 or 3 days. If this diet is to be used for an extended period of time (more than 7 days), a multivitamin should be considered.  Breads and Starches  Allowed: None are allowed   Avoid: Any others.    Potatoes/Pasta/Rice  Allowed: ANY ITEM AS A SOUP OR SMALL PLATE OF MASHED POTATOES OR SCRAMBLED EGGS. (DO NOT EAT MORE THAN ONE SERVING ON THE DAY BEFORE COLONOSCOPY).      Vegetables  Allowed: Strained tomato or vegetable juice. Vegetables pureed in soup.   Avoid: Any others.    Fruit  Allowed: Any strained fruit juices and fruit drinks. Include 1 serving of citrus or vitamin C-enriched fruit juice daily.   Avoid: Any others.  Meat and Meat Substitutes  Allowed: Egg  Avoid: Any meat, fish, or fowl. All cheese.  Milk  Allowed: SOY Milk beverages, including milk shakes and instant breakfast mixes. Smooth yogurt.   Avoid: Any others. Avoid dairy products if not tolerated.    Soups and Combination Foods  Allowed: Broth, strained cream soups. Strained, broth-based soups.   Avoid: Any others.    Desserts and Sweets  Allowed: flavored gelatin, tapioca, ice cream, sherbet, smooth pudding, junket, fruit ices, frozen ice pops, pudding pops, frozen fudge pops, chocolate syrup. Sugar, honey, jelly, syrup.   Avoid: Any others.  Fats and Oils  Allowed: Margarine, butter, cream, sour cream, oils.   Avoid: Any others.  Beverages  Allowed: All.   Avoid: None.  Condiments  Allowed: Iodized salt, pepper, spices, flavorings. Cocoa powder.   Avoid: Any others.    SAMPLE MEAL PLAN Breakfast   cup orange juice.   1 OR 2 EGGS  1 cup milk.   1 cup beverage (coffee or tea).   Cream or sugar, if  desired.    Midmorning Snack  2 SCRAMBLED OR HARD BOILED EGG   Lunch  1 cup cream soup.    cup fruit juice.   1 cup milk.    cup custard.   1 cup beverage (coffee or tea).   Cream or sugar, if desired.    Midafternoon Snack  1 cup milk shake.  Dinner  1 cup cream soup.    cup fruit juice.   1 cup MILK    cup pudding.   1 cup beverage (coffee or tea).   Cream or sugar, if desired.  Evening Snack  1 cup supplement.  To increase calories, add sugar, cream, butter, or margarine if possible. Nutritional supplements will also increase the total calories.

## 2017-01-22 ENCOUNTER — Other Ambulatory Visit: Payer: Self-pay

## 2017-01-22 DIAGNOSIS — Z86010 Personal history of colonic polyps: Secondary | ICD-10-CM

## 2017-01-22 MED ORDER — PEG-KCL-NACL-NASULF-NA ASC-C 100 G PO SOLR: 1.0000 | ORAL | 0 refills | Status: DC

## 2017-01-22 NOTE — Telephone Encounter (Signed)
Called pt. Colonoscopy with [facility] scheduled for 03/06/17 at 8:30am. Rx for Moviprep sent to pharmacy. Instructions mailed to pt. Full liquid diet mailed with instructions. Orders entered.

## 2017-03-04 ENCOUNTER — Telehealth: Payer: Self-pay
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
## 2017-04-09 ENCOUNTER — Encounter (HOSPITAL_COMMUNITY): Payer: Self-pay

## 2017-04-09 ENCOUNTER — Ambulatory Visit (HOSPITAL_COMMUNITY)
Admission: RE | Admit: 2017-04-09 | Discharge: 2017-04-09 | Disposition: A | Discharge status: Home / Self Care | Payer: Medicare HMO | Source: Ambulatory Visit | Attending: Gastroenterology | Admitting: Gastroenterology

## 2017-04-09 ENCOUNTER — Encounter (HOSPITAL_COMMUNITY)
Admission: RE | Disposition: A | Discharge status: Home / Self Care | Payer: Self-pay | Source: Ambulatory Visit | Attending: Gastroenterology

## 2017-04-09 ENCOUNTER — Telehealth: Payer: Self-pay | Admitting: Gastroenterology

## 2017-04-09 DIAGNOSIS — Z86010 Personal history of colonic polyps: Secondary | ICD-10-CM | POA: Insufficient documentation

## 2017-04-09 DIAGNOSIS — D128 Benign neoplasm of rectum: Secondary | ICD-10-CM | POA: Diagnosis not present

## 2017-04-09 DIAGNOSIS — Z1211 Encounter for screening for malignant neoplasm of colon: Secondary | ICD-10-CM | POA: Insufficient documentation

## 2017-04-09 DIAGNOSIS — Z882 Allergy status to sulfonamides status: Secondary | ICD-10-CM | POA: Diagnosis not present

## 2017-04-09 DIAGNOSIS — Q438 Other specified congenital malformations of intestine: Secondary | ICD-10-CM | POA: Insufficient documentation

## 2017-04-09 DIAGNOSIS — Z8542 Personal history of malignant neoplasm of other parts of uterus: Secondary | ICD-10-CM | POA: Insufficient documentation

## 2017-04-09 DIAGNOSIS — Z853 Personal history of malignant neoplasm of breast: Secondary | ICD-10-CM | POA: Diagnosis not present

## 2017-04-09 DIAGNOSIS — K644 Residual hemorrhoidal skin tags: Secondary | ICD-10-CM | POA: Diagnosis not present

## 2017-04-09 DIAGNOSIS — K648 Other hemorrhoids: Secondary | ICD-10-CM | POA: Insufficient documentation

## 2017-04-09 DIAGNOSIS — K621 Rectal polyp: Secondary | ICD-10-CM | POA: Insufficient documentation

## 2017-04-09 HISTORY — PX: COLONOSCOPY: SHX5424

## 2017-04-09 HISTORY — PX: POLYPECTOMY: SHX5525

## 2017-04-09 SURGERY — COLONOSCOPY
Anesthesia: Moderate Sedation

## 2017-04-09 MED ORDER — MIDAZOLAM HCL 5 MG/5ML IJ SOLN
INTRAMUSCULAR | Status: AC
  Filled 2017-04-09: qty 10

## 2017-04-09 MED ORDER — MEPERIDINE HCL 100 MG/ML IJ SOLN
INTRAMUSCULAR | Status: AC
  Filled 2017-04-09: qty 2

## 2017-04-09 MED ORDER — SODIUM CHLORIDE 0.9 % IV SOLN
INTRAVENOUS | Status: DC
  Administered 2017-04-09: 09:00:00 via INTRAVENOUS

## 2017-04-09 MED ORDER — MIDAZOLAM HCL 5 MG/5ML IJ SOLN
INTRAMUSCULAR | Status: DC | PRN
  Administered 2017-04-09 (×2): 2 mg via INTRAVENOUS

## 2017-04-09 MED ORDER — PROMETHAZINE HCL 25 MG/ML IJ SOLN
12.5000 mg | Freq: Once | INTRAMUSCULAR | Status: AC
  Administered 2017-04-09: 12.5 mg via INTRAVENOUS

## 2017-04-09 MED ORDER — SODIUM CHLORIDE 0.9% FLUSH
INTRAVENOUS | Status: AC
  Filled 2017-04-09: qty 10

## 2017-04-09 MED ORDER — STERILE WATER FOR IRRIGATION IR SOLN
Status: DC | PRN
  Administered 2017-04-09: 2.5 mL

## 2017-04-09 MED ORDER — PROMETHAZINE HCL 25 MG/ML IJ SOLN
INTRAMUSCULAR | Status: AC
  Filled 2017-04-09: qty 1

## 2017-04-09 MED ORDER — MEPERIDINE HCL 100 MG/ML IJ SOLN
INTRAMUSCULAR | Status: DC | PRN
  Administered 2017-04-09: 50 mg via INTRAVENOUS
  Administered 2017-04-09: 25 mg via INTRAVENOUS

## 2017-04-09 NOTE — Op Note (Signed)
[facility] Patient Name: Kayla Thomas Procedure Date: 04/09/2017 9:16 AM MRN: [id number] Date of Birth: Jul 19, 1940 Attending MD: Barney Drain MD, MD CSN: [id number] Age: 76 Admit Type: Outpatient Procedure:                Colonoscopy WITH COLD SNARE POLYPECTOMY Indications:              High risk colon cancer surveillance: Personal                            history of colonic polyps Providers:                Barney Drain MD, MD, Selena Lesser, Aram Candela Referring MD:             Asencion Noble Medicines:                Promethazine 12.5 mg IV, Meperidine 75 mg IV,                            Midazolam 4 mg IV Complications:            No immediate complications. Estimated Blood Loss:     Estimated blood loss was minimal. Procedure:                Pre-Anesthesia Assessment:                           - Prior to the procedure, a History and Physical                            was performed, and patient medications and                            allergies were reviewed. The patient's tolerance of                            previous anesthesia was also reviewed. The risks                            and benefits of the procedure and the sedation                            options and risks were discussed with the patient.                            All questions were answered, and informed consent                            was obtained. Prior Anticoagulants: The patient has                            taken no previous anticoagulant or antiplatelet                            agents. ASA Grade Assessment: II - A patient with  mild systemic disease. After reviewing the risks                            and benefits, the patient was deemed in                            satisfactory condition to undergo the procedure.                            After obtaining informed consent, the colonoscope                            was passed under direct vision.  Throughout the                            procedure, the patient's blood pressure, pulse, and                            oxygen saturations were monitored continuously. The                            EC-[id number] ([id number]) scope was introduced through                            the anus and advanced to the the cecum, identified                            by appendiceal orifice and ileocecal valve. The                            colonoscopy was somewhat difficult due to a                            tortuous colon. Successful completion of the                            procedure was aided by COLOWRAP. The patient                            tolerated the procedure fairly well. The quality of                            the bowel preparation was excellent. The ileocecal                            valve, appendiceal orifice, and rectum were                            photographed. Scope In: 9:43:19 AM Scope Out: 10:00:09 AM Scope Withdrawal Time: 0 hours 12 minutes 45 seconds  Total Procedure Duration: 0 hours 16 minutes 50 seconds  Findings:      A 6 mm polyp was found in the rectum. The polyp was sessile. The polyp       was removed  with a cold snare. Resection and retrieval were complete.      The recto-sigmoid colon, sigmoid colon and descending colon were       moderately redundant.      Internal hemorrhoids were found during retroflexion. The hemorrhoids       were small.      External hemorrhoids were found during retroflexion. The hemorrhoids       were large. Impression:               - One 6 mm polyp in the rectum, removed with a cold                            snare. Resected and retrieved.                           - Redundant left colon.                           - Internal hemorrhoids.                           - External hemorrhoids. Moderate Sedation:      Moderate (conscious) sedation was administered by the endoscopy nurse       and supervised by the endoscopist. The  following parameters were       monitored: oxygen saturation, heart rate, blood pressure, and response       to care. Total physician intraservice time was 32 minutes. Recommendation:           - Repeat colonoscopy 10-15 YEARS IF THE BENEFITS                            OUTWEIGH THE RISKS for surveillance.                           - High fiber diet.                           - Continue present medications.                           - Await pathology results.                           - Patient has a contact number available for                            emergencies. The signs and symptoms of potential                            delayed complications were discussed with the                            patient. Return to normal activities tomorrow.                            Written discharge instructions were provided to the  patient. Procedure Code(s):        --- Professional ---                           [phone], Colonoscopy, flexible; with removal of                            tumor(s), polyp(s), or other lesion(s) by snare                            technique                           99152, Moderate sedation services provided by the                            same physician or other qualified health care                            professional performing the diagnostic or                            therapeutic service that the sedation supports,                            requiring the presence of an independent trained                            observer to assist in the monitoring of the                            patient's level of consciousness and physiological                            status; initial 15 minutes of intraservice time,                            patient age 2 years or older                           [phone], Moderate sedation services; each additional                            15 minutes intraservice time Diagnosis Code(s):        ---  Professional ---                           Z86.010, Personal history of colonic polyps                           K62.1, Rectal polyp                           K64.4, Residual hemorrhoidal skin tags  K64.8, Other hemorrhoids                           Q43.8, Other specified congenital malformations of                            intestine CPT copyright 2016 [employer]. All rights reserved. The codes documented in this report are preliminary and upon coder review may  be revised to meet current compliance requirements. Barney Drain, MD Barney Drain MD, MD 04/09/2017 10:07:18 AM This report has been signed electronically. Number of Addenda: 0

## 2017-04-09 NOTE — Discharge Instructions (Signed)
You had 1 polyp removed. You have SMALL internal AND LARGE EXTERNAL HEMORRHOIDS.   DRINK WATER TO KEEP YOUR URINE LIGHT YELLOW.  FOLLOW A HIGH FIBER DIET. AVOID ITEMS THAT CAUSE BLOATING & GAS. SEE INFO BELOW.  YOUR BIOPSY RESULTS WILL BE AVAILABLE IN MY CHART AFTER OCT 22 AND MY OFFICE WILL CONTACT YOU IN 10-14 DAYS WITH YOUR RESULTS.   SEE DR. Lakiyah Arntson NOV 7 @1130 .  Next colonoscopy in 10-15 YEARS IF THE BENEFITS OUTWEIGH THE RISKS.   Colonoscopy Care After Read the instructions outlined below and refer to this sheet in the next week. These discharge instructions provide you with general information on caring for yourself after you leave the hospital. While your treatment has been planned according to the most current medical practices available, unavoidable complications occasionally occur. If you have any problems or questions after discharge, call DR. Fayola Meckes, [phone].  ACTIVITY  You may resume your regular activity, but move at a slower pace for the next 24 hours.   Take frequent rest periods for the next 24 hours.   Walking will help get rid of the air and reduce the bloated feeling in your belly (abdomen).   No driving for 24 hours (because of the medicine (anesthesia) used during the test).   You may shower.   Do not sign any important legal documents or operate any machinery for 24 hours (because of the anesthesia used during the test).    NUTRITION  Drink plenty of fluids.   You may resume your normal diet as instructed by your doctor.   Begin with a light meal and progress to your normal diet. Heavy or fried foods are harder to digest and may make you feel sick to your stomach (nauseated).   Avoid alcoholic beverages for 24 hours or as instructed.    MEDICATIONS  You may resume your normal medications.   WHAT YOU CAN EXPECT TODAY  Some feelings of bloating in the abdomen.   Passage of more gas than usual.   Spotting of blood in your stool or on the  toilet paper  .  IF YOU HAD POLYPS REMOVED DURING THE COLONOSCOPY:  Eat a soft diet IF YOU HAVE NAUSEA, BLOATING, ABDOMINAL PAIN, OR VOMITING.    FINDING OUT THE RESULTS OF YOUR TEST Not all test results are available during your visit. DR. Oneida Alar WILL CALL YOU WITHIN 14 DAYS OF YOUR PROCEDUE WITH YOUR RESULTS. Do not assume everything is normal if you have not heard from DR. Alyona Romack, CALL HER OFFICE AT [phone].  SEEK IMMEDIATE MEDICAL ATTENTION AND CALL THE OFFICE: [phone] IF:  You have more than a spotting of blood in your stool.   Your belly is swollen (abdominal distention).   You are nauseated or vomiting.   You have a temperature over 101F.   You have abdominal pain or discomfort that is severe or gets worse throughout the day.   High-Fiber Diet A high-fiber diet changes your normal diet to include more whole grains, legumes, fruits, and vegetables. Changes in the diet involve replacing refined carbohydrates with unrefined foods. The calorie level of the diet is essentially unchanged. The Dietary Reference Intake (recommended amount) for adult males is 38 grams per day. For adult females, it is 25 grams per day. Pregnant and lactating women should consume 28 grams of fiber per day. Fiber is the intact part of a plant that is not broken down during digestion. Functional fiber is fiber that has been isolated from the plant to  provide a beneficial effect in the body. PURPOSE  Increase stool bulk.   Ease and regulate bowel movements.   Lower cholesterol.   REDUCE RISK OF COLON CANCER  INDICATIONS THAT YOU NEED MORE FIBER  Constipation and hemorrhoids.   Uncomplicated diverticulosis (intestine condition) and irritable bowel syndrome.   Weight management.   As a protective measure against hardening of the arteries (atherosclerosis), diabetes, and cancer.   GUIDELINES FOR INCREASING FIBER IN THE DIET  Start adding fiber to the diet slowly. A gradual increase  of about 5 more grams (2 slices of whole-wheat bread, 2 servings of most fruits or vegetables, or 1 bowl of high-fiber cereal) per day is best. Too rapid an increase in fiber may result in constipation, flatulence, and bloating.   Drink enough water and fluids to keep your urine clear or pale yellow. Water, juice, or caffeine-free drinks are recommended. Not drinking enough fluid may cause constipation.   Eat a variety of high-fiber foods rather than one type of fiber.   Try to increase your intake of fiber through using high-fiber foods rather than fiber pills or supplements that contain small amounts of fiber.   The goal is to change the types of food eaten. Do not supplement your present diet with high-fiber foods, but replace foods in your present diet.   INCLUDE A VARIETY OF FIBER SOURCES  Replace refined and processed grains with whole grains, canned fruits with fresh fruits, and incorporate other fiber sources. White rice, white breads, and most bakery goods contain little or no fiber.   Brown whole-grain rice, buckwheat oats, and many fruits and vegetables are all good sources of fiber. These include: broccoli, Brussels sprouts, cabbage, cauliflower, beets, sweet potatoes, white potatoes (skin on), carrots, tomatoes, eggplant, squash, berries, fresh fruits, and dried fruits.   Cereals appear to be the richest source of fiber. Cereal fiber is found in whole grains and bran. Bran is the fiber-rich outer coat of cereal grain, which is largely removed in refining. In whole-grain cereals, the bran remains. In breakfast cereals, the largest amount of fiber is found in those with "bran" in their names. The fiber content is sometimes indicated on the label.   You may need to include additional fruits and vegetables each day.   In baking, for 1 cup white flour, you may use the following substitutions:   1 cup whole-wheat flour minus 2 tablespoons.   1/2 cup white flour plus 1/2 cup whole-wheat  flour.   Polyps, Colon  A polyp is extra tissue that grows inside your body. Colon polyps grow in the large intestine. The large intestine, also called the colon, is part of your digestive system. It is a long, hollow tube at the end of your digestive tract where your body makes and stores stool. Most polyps are not dangerous. They are benign. This means they are not cancerous. But over time, some types of polyps can turn into cancer. Polyps that are smaller than a pea are usually not harmful. But larger polyps could someday become or may already be cancerous. To be safe, doctors remove all polyps and test them.   WHO GETS POLYPS? Anyone can get polyps, but certain people are more likely than others. You may have a greater chance of getting polyps if:  You are over 50.   You have had polyps before.   Someone in your family has had polyps.   Someone in your family has had cancer of the large intestine.  Find out if someone in your family has had polyps. You may also be more likely to get polyps if you:   Eat a lot of fatty foods   Smoke   Drink alcohol   Do not exercise  Eat too much   PREVENTION There is not one sure way to prevent polyps. You might be able to lower your risk of getting them if you:  Eat more fruits and vegetables and less fatty food.   Do not smoke.   Avoid alcohol.   Exercise every day.   Lose weight if you are overweight.   Eating more calcium and folate can also lower your risk of getting polyps. Some foods that are rich in calcium are milk, cheese, and broccoli. Some foods that are rich in folate are chickpeas, kidney beans, and spinach.   Hemorrhoids Hemorrhoids are dilated (enlarged) veins around the rectum. Sometimes clots will form in the veins. This makes them swollen and painful. These are called thrombosed hemorrhoids. Causes of hemorrhoids include:  Constipation.   Straining to have a bowel movement.   HEAVY LIFTING  HOME CARE  INSTRUCTIONS  Eat a well balanced diet and drink 6 to 8 glasses of water every day to avoid constipation. You may also use a bulk laxative.   Avoid straining to have bowel movements.   Keep anal area dry and clean.   Do not use a donut shaped pillow or sit on the toilet for long periods. This increases blood pooling and pain.   Move your bowels when your body has the urge; this will require less straining and will decrease pain and pressure.

## 2017-04-09 NOTE — H&P (Signed)
Primary Care Physician:  Asencion Noble, MD Primary Gastroenterologist:  Dr. Oneida Alar  Pre-Procedure History & Physical: HPI:  Kayla Thomas is a 76 y.o. female here for  PERSONAL HISTORY OF POLYPS.  Past Medical History:  Diagnosis Date  . Breast cancer ([place])   . Breast cancer ([place])   . Chronic back pain   . Gastric ulcer    History  . GERD (gastroesophageal reflux disease) 01/21/2006   EGD Dr Oneida Alar mild chronic gastritis, (NO h pylori) otherwise normal  . Headache(784.0)   . Sinus congestion   . Thumb tendonitis September 2014   left  . Uterine cancer ([place]) 1998  . Wears partial dentures     Past Surgical History:  Procedure Laterality Date  . ABDOMINAL HYSTERECTOMY    . ABSCESS DRAINAGE  03-04-05   insect bite  . CARDIAC CATHETERIZATION N/A 10/31/2014   Procedure: Right/Left Heart Cath and Coronary Angiography;  Surgeon: Troy Sine, MD;  Location: [facility];  Service: Cardiovascular;  Laterality: N/A;  . CATARACT EXTRACTION W/PHACO  10/06/2011   Procedure: CATARACT EXTRACTION PHACO AND INTRAOCULAR LENS PLACEMENT (IOC);  Surgeon: Williams Che, MD;  Location: [facility];  Service: Ophthalmology;  Laterality: Left;  CDE:  5.76  . cataracts    . CHOLECYSTECTOMY  11/1999  . COLONOSCOPY  05/13/2011   Procedure: COLONOSCOPY;  Surgeon: Dorothyann Peng, MD;  Location: [facility];  Service: Endoscopy;  Laterality: N/A;  8:30  . ESOPHAGOGASTRODUODENOSCOPY  01/21/06   mild antral erythema bx h-pylori/normal esophagus without evidence of mass or Barrett's/normal pylorus and duodenum  . PARTIAL MASTECTOMY WITH NEEDLE LOCALIZATION AND AXILLARY SENTINEL LYMPH NODE BX Left 08/18/2012   Procedure: PARTIAL MASTECTOMY WITH NEEDLE LOCALIZATION AND AXILLARY SENTINEL LYMPH NODE BX;  Surgeon: Jamesetta So, MD;  Location: [facility];  Service: General;  Laterality: Left;  Need Frozen Section/Sentinel Node Bx @ 8:00am/Needle Loc @ 9:00am  . S/P Hysterectomy  1998   uterine ca  .  temperal/mandibular  1974  . TONSILLECTOMY  1964  . TRIGGER FINGER RELEASE Right 05/04/2014   Procedure: RELEASE TRIGGER FINGER/A-1 PULLEY RIGHT RING FINGER;  Surgeon: Daryll Brod, MD;  Location: [facility];  Service: Orthopedics;  Laterality: Right;    Prior to Admission medications   Medication Sig Start Date End Date Taking? Authorizing Provider  acetaminophen (TYLENOL) 325 MG tablet Take 650 mg by mouth every 6 (six) hours as needed (for pain.).   Yes [provider]  B Complex-C (SUPER B COMPLEX PO) Take 1 capsule by mouth daily.    Yes [provider]  Calcium Carb-Cholecalciferol (CALCIUM 600/VITAMIN D3) 600-800 MG-UNIT TABS Take 1 capsule by mouth 2 (two) times daily.   Yes [provider]  ciclopirox (PENLAC) 8 % solution Apply 1 application topically 2 (two) times daily. Apply over nail and surrounding skin. Apply daily over previous coat. After seven (7) days, may remove with alcohol and continue cycle.    Yes [provider]  diclofenac sodium (VOLTAREN) 1 % GEL Apply 2-4 g topically 3 (three) times daily as needed (for pain.).  01/19/15  Yes [provider]  diphenhydrAMINE (BENADRYL) 25 mg capsule Take 25 mg by mouth at bedtime.   Yes [provider]  Glucosamine-Chondroitin (COSAMIN DS PO) Take 1 tablet by mouth daily.   Yes [provider]  ipratropium (ATROVENT) 0.03 % nasal spray Place 2 sprays into the nose 2 (two) times daily as needed (for allergies.).  Yes [provider]  loperamide (IMODIUM) 2 MG capsule Take 2 mg by mouth 4 (four) times daily as needed for diarrhea or loose stools.    Yes [provider]  Lutein 40 MG CAPS Take 40 mg by mouth daily.   Yes [provider]  Multiple Vitamin (MULTIVITAMIN WITH MINERALS) TABS tablet Take 1 tablet by mouth daily.   Yes [provider]  omeprazole (PRILOSEC) 20 MG capsule Take 20 mg by mouth 2 (two) times daily  before a meal.  04/22/11  Yes Vickey Huger L, NP  peg 3350 powder (MOVIPREP) 100 g SOLR Take 1 kit (200 g total) by mouth as directed. 01/22/17  Yes Dawood Spitler, Marga Melnick, MD  tamoxifen (NOLVADEX) 20 MG tablet TAKE ONE TABLET BY MOUTH DAILY. 08/05/16  Yes Kefalas, Manon Hilding, PA-C  Turmeric 500 MG CAPS Take 500 mg by mouth daily.   Yes [provider]    Allergies as of 01/22/2017 - Review Complete 01/20/2017  Allergen Reaction Noted  . Aspirin Other (See Comments)   . Iron Anaphylaxis   . Sulfa antibiotics Rash 04/22/2011  . Sulfasalazine Rash 04/22/2011  . Ancef [cefazolin] Rash 08/28/2012    Family History  Problem Relation Age of Onset  . Liver cancer Father   . Alcohol abuse Father   . Hypertension Mother   . COPD Mother   . Diabetes Brother     Social History   Social History  . Marital status: Widowed    Spouse name: N/A  . Number of children: 1  . Years of education: N/A   Occupational History  . retired; Facilities manager asst Retired   Social History Main Topics  . Smoking status: Never Smoker  . Smokeless tobacco: Never Used  . Alcohol use No  . Drug use: No  . Sexual activity: Not Currently    Birth control/ protection: Surgical   Other Topics Concern  . Not on file   Social History Narrative   1 adopted son-grown   Lives alone    Review of Systems: See HPI, otherwise negative ROS   Physical Exam: BP 139/60   Pulse 71   Temp 98.8 F (37.1 C) (Oral)   Resp 19   Ht '5\' 3"'$  (1.6 m)   Wt 190 lb (86.2 kg)   SpO2 98%   BMI 33.66 kg/m  General:   Alert,  pleasant and cooperative in NAD Head:  Normocephalic and atraumatic. Neck:  Supple; Lungs:  Clear throughout to auscultation.    Heart:  Regular rate and rhythm. Abdomen:  Soft, nontender and nondistended. Normal bowel sounds, without guarding, and without rebound.   Neurologic:  Alert and  oriented x4;  grossly normal neurologically.  Impression/Plan:     PERSONAL HISTORY OF POLYPS.  PLAN: 1.  TCS TODAY DISCUSSED PROCEDURE, BENEFITS, & RISKS: < 1% chance of medication reaction, bleeding, perforation, or rupture of spleen/liver.

## 2017-04-09 NOTE — Telephone Encounter (Signed)
REMINDER IN EPIC AND OV MADE

## 2017-04-09 NOTE — Telephone Encounter (Signed)
SEE DR. FIELDS NOV 7 @1130  E30 LUQ PAIN.  Next colonoscopy in 10-15 YEARS IF THE BENEFITS OUTWEIGH THE RISKS.

## 2017-04-13 ENCOUNTER — Encounter (HOSPITAL_COMMUNITY): Payer: Self-pay | Admitting: Gastroenterology

## 2017-04-29 ENCOUNTER — Ambulatory Visit (INDEPENDENT_AMBULATORY_CARE_PROVIDER_SITE_OTHER): Payer: Medicare HMO | Admitting: Gastroenterology

## 2017-04-29 DIAGNOSIS — K219 Gastro-esophageal reflux disease without esophagitis: Secondary | ICD-10-CM | POA: Diagnosis not present

## 2017-04-29 DIAGNOSIS — K621 Rectal polyp: Secondary | ICD-10-CM

## 2017-04-29 DIAGNOSIS — R197 Diarrhea, unspecified: Secondary | ICD-10-CM | POA: Diagnosis not present

## 2017-04-29 DIAGNOSIS — K909 Intestinal malabsorption, unspecified: Secondary | ICD-10-CM | POA: Diagnosis not present

## 2017-04-29 NOTE — Patient Instructions (Addendum)
TO REDUCE BOWEL IRREGULARITY:      1. ADD LACTASE 2 OR 3 PILLS WITH MEALS THREE TIMES A DAY.      2. WATCH THE DAIRY IN YOUR DIET.  SEE INFO BELOW.   DRINK WATER TO KEEP YOUR URINE LIGHT YELLOW.  FOLLOW A HIGH FIBER DIET. AVOID ITEMS THAT CAUSE BLOATING & GAS.   YOU HAD ONE SIMPLE ADENOMA REMOVED. Next colonoscopy in 10-15 YEARS IF THE BENEFITS OUTWEIGH THE RISKS.  FOLLOW UP IN 4 MOS.    Lactose Free Diet Lactose is a carbohydrate that is found mainly in milk and milk products, as well as in foods with added milk or whey. Lactose must be digested by the enzyme in order to be used by the body. Lactose intolerance occurs when there is a shortage of lactase. When your body is not able to digest lactose, you may feel sick to your stomach (nausea), bloating, cramping, gas and diarrhea.  There are many dairy products that may be tolerated better than milk by some people:  The use of cultured dairy products such as yogurt, buttermilk, cottage cheese, and sweet acidophilus milk (Kefir) for lactase-deficient individuals is usually well tolerated. This is because the healthy bacteria help digest lactose.   Lactose-hydrolyzed milk (Lactaid) contains 40-90% less lactose than milk and may also be well tolerated.    SPECIAL NOTES  Lactose is a carbohydrates. The major food source is dairy products. Reading food labels is important. Many products contain lactose even when they are not made from milk. Look for the following words: whey, milk solids, dry milk solids, nonfat dry milk powder. Typical sources of lactose other than dairy products include breads, candies, cold cuts, prepared and processed foods, and commercial sauces and gravies.   All foods must be prepared without milk, cream, or other dairy foods.   Soy milk and lactose-free supplements (LACTASE) may be used as an alternative to milk.   FOOD GROUP ALLOWED/RECOMMENDED AVOID/USE SPARINGLY  BREADS / STARCHES 4 servings or more* Breads  and rolls made without milk. [place], [place], or [place] bread. Breads and rolls that contain milk. Prepared mixes such as muffins, biscuits, waffles, pancakes. Sweet rolls, donuts, [place] toast (if made with milk or lactose).  Crackers: Soda crackers, graham crackers. Any crackers prepared without lactose. Zwieback crackers, corn curls, or any that contain lactose.  Cereals: Cooked or dry cereals prepared without lactose (read labels). Cooked or dry cereals prepared with lactose (read labels). Total, Cocoa Krispies. Special K.  Potatoes / Pasta / Rice: Any prepared without milk or lactose. Popcorn. Instant potatoes, frozen [place] fries, scalloped or au gratin potatoes.  VEGETABLES 2 servings or more Fresh, frozen, and canned vegetables. Creamed or breaded vegetables. Vegetables in a cheese sauce or with lactose-containing margarines.  FRUIT 2 servings or more All fresh, canned, or frozen fruits that are not processed with lactose. Any canned or frozen fruits processed with lactose.  MEAT & SUBSTITUTES 2 servings or more (4 to 6 oz. total per day) Plain beef, chicken, fish, [place], lamb, veal, pork, or ham. Kosher prepared meat products. Strained or junior meats that do not contain milk. Eggs, soy meat substitutes, nuts. Scrambled eggs, omelets, and souffles that contain milk. Creamed or breaded meat, fish, or fowl. Sausage products such as wieners, liver sausage, or cold cuts that contain milk solids. Cheese, cottage cheese, or cheese spreads.  MILK None. (See "BEVERAGES" for milk substitutes. See "DESSERTS" for ice cream and frozen desserts.) Milk (whole, 2%, skim, or chocolate).  Evaporated, powdered, or condensed milk; malted milk.  SOUPS & COMBINATION FOODS Bouillon, broth, vegetable soups, clear soups, consomms. Homemade soups made with allowed ingredients. Combination or prepared foods that do not contain milk or milk products (read labels). Cream soups, chowders, commercially prepared soups  containing lactose. Macaroni and cheese, pizza. Combination or prepared foods that contain milk or milk products.  DESSERTS & SWEETS In moderation Water and fruit ices; gelatin; angel food cake. Homemade cookies, pies, or cakes made from allowed ingredients. Pudding (if made with water or a milk substitute). Lactose-free tofu desserts. Sugar, honey, corn syrup, jam, jelly; marmalade; molasses (beet sugar); Pure sugar candy; marshmallows. Ice cream, ice milk, sherbet, custard, pudding, frozen yogurt. Commercial cake and cookie mixes. Desserts that contain chocolate. Pie crust made with milk-containing margarine; reduced-calorie desserts made with a sugar substitute that contains lactose. Toffee, peppermint, butterscotch, chocolate, caramels.  FATS & OILS In moderation Butter (as tolerated; contains very small amounts of lactose). Margarines and dressings that do not contain milk, Vegetable oils, shortening, Miracle Whip, mayonnaise, nondairy cream & whipped toppings without lactose or milk solids added (examples: Coffee Rich, Carnation Coffeemate, Rich's Whipped Topping, PolyRich). Berniece Salines. Margarines and salad dressings containing milk; cream, cream cheese; peanut butter with added milk solids, sour cream, chip dips, made with sour cream.  BEVERAGES Carbonated drinks; tea; coffee and freeze-dried coffee; some instant coffees (check labels). Fruit drinks; fruit and vegetable juice; Rice or Soy milk. Ovaltine, hot chocolate. Some cocoas; some instant coffees; instant iced teas; powdered fruit drinks (read labels).   CONDIMENTS / MISCELLANEOUS Soy sauce, carob powder, olives, gravy made with water, baker's cocoa, pickles, pure seasonings and spices, wine, pure monosodium glutamate, catsup, mustard. Some chewing gums, chocolate, some cocoas. Certain antibiotics and vitamin / mineral preparations. Spice blends if they contain milk products. MSG extender. Artificial sweeteners that contain lactose such as Equal  (Nutra-Sweet) and Sweet 'n Low. Some nondairy creamers (read labels).

## 2017-04-29 NOTE — Assessment & Plan Note (Signed)
Next colonoscopy in 10-15 YEARS IF THE BENEFITS OUTWEIGH THE RISKS.

## 2017-04-29 NOTE — Assessment & Plan Note (Signed)
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  ADD LACTASE 2 OR 3 PILLS WITH MEALS THREE TIMES A DAY. WATCH THE DAIRY IN YOUR DIET.  HANDOUT GIVEN. DRINK WATER TO KEEP YOUR URINE LIGHT YELLOW. FOLLOW A HIGH FIBER DIET. FOLLOW UP IN 4 MOS.

## 2017-04-29 NOTE — Progress Notes (Signed)
Subjective:    Patient ID: Kayla Thomas, female    DOB: 09-23-1940, 76 y.o.   MRN: [id number]  Asencion Noble, MD   HPI Diarrhea for a long time. STARTED AFTER XRT. STARTS HARD(CONSTIPATION) TAKES FOREVER TO GET IT OUT AND THEN BECOMES LOOSE/WATERY AND SOMETIME SOFT/LOOSE WATERY. MILK: 1 GLASS IN THE EVENING AND YOGURT IN THE MORNING. EATS A LOT OF CHEESE THROUGHOUT THE DAY. HAVEN'T ASSOCIATED WITH DAIRY CONSUMPTION. HAS BM BEFORE EATING CHEESE. USUALLY GETS IT AL OUT IN THE MORNING AND THAT'S IT. BIGGEST PROBLEM IS CRAMPY R AND LEFT PAIN UNDER RIBS(COUPLE TIMES A WEEK) GETS BETTER WIT PEPTO BISMOL. BURPING AND PASSING GAS DOESN'T MAKE ANY DIFFERENCE. NO PROBIOTIC.  MEDS TRIED: IMODIUM PRN  PT DENIES FEVER, CHILLS, HEMATOCHEZIA, HEMATEMESIS, nausea, vomiting, melena, diarrhea, CHEST PAIN, SHORTNESS OF BREATH, CHANGE IN BOWEL IN HABITS, problems swallowing, problems with sedation ,OR heartburn or indigestion.   Past Medical History:  Diagnosis Date  . Breast cancer ([place])       . Chronic back pain   . Gastric ulcer    History  . GERD (gastroesophageal reflux disease) 01/21/2006   EGD Dr Oneida Alar mild chronic gastritis, (NO h pylori) otherwise normal  . Headache(784.0)   . Sinus congestion   . Thumb tendonitis September 2014   left  . Uterine cancer ([place]) 1998  . Wears partial dentures    Past Surgical History:  Procedure Laterality Date  . ABDOMINAL HYSTERECTOMY    . ABSCESS DRAINAGE  03-04-05   insect bite  . cataracts    . CHOLECYSTECTOMY  11/1999  . ESOPHAGOGASTRODUODENOSCOPY  01/21/06   mild antral erythema bx h-pylori/normal esophagus without evidence of mass or Barrett's/normal pylorus and duodenum  . S/P Hysterectomy  1998   uterine ca  . temperal/mandibular  1974  . TONSILLECTOMY  1964   Allergies  Allergen Reactions  . Aspirin Other (See Comments)    REACTION: Abdominal pain   . Iron Anaphylaxis    REACTION: abdominal pain   . Sulfa Antibiotics Rash    Rash  all over  . Sulfasalazine Rash    Rash all over  . Ancef [Cefazolin] Rash     Current Outpatient Medications  Medication Sig Dispense Refill  . acetaminophen (TYLENOL) 325 MG tablet Take 650 mg by mouth every 6 (six) hours as needed (for pain.).    Marland Kitchen B Complex-C (SUPER B COMPLEX PO) Take 1 capsule by mouth daily.     . Calcium Carb-Cholecalciferol (CALCIUM 600/VITAMIN D3) 600-800 MG-UNIT TABS Take 1 capsule by mouth 2 (two) times daily.    . ciclopirox (PENLAC) 8 % solution Apply 1 application topically 2 (two) times daily. Apply over nail and surrounding skin. Apply daily over previous coat. After seven (7) days, may remove with alcohol and continue cycle.     . diclofenac sodium (VOLTAREN) 1 % GEL Apply 2-4 g topically 3 (three) times daily as needed (for pain.).     Marland Kitchen diphenhydrAMINE (BENADRYL) 25 mg capsule Take 25 mg by mouth at bedtime.    . Glucosamine-Chondroitin (COSAMIN DS PO) Take 1 tablet by mouth daily.    Marland Kitchen ipratropium (ATROVENT) 0.03 % nasal spray Place 2 sprays into the nose 2 (two) times daily as needed (for allergies.).     Marland Kitchen loperamide (IMODIUM) 2 MG capsule Take 2 mg by mouth 4 (four) times daily as needed for diarrhea or loose stools.     . Lutein 40 MG CAPS Take 40 mg by mouth daily.    Marland Kitchen  Multiple Vitamin (MULTIVITAMIN WITH MINERALS) TABS tablet Take 1 tablet by mouth daily.    Marland Kitchen omeprazole (PRILOSEC) 20 MG capsule Take 20 mg by mouth 2 (two) times daily before a meal.     . tamoxifen (NOLVADEX) 20 MG tablet TAKE ONE TABLET BY MOUTH DAILY.    . Turmeric 500 MG CAPS Take 500 mg by mouth daily.      Review of Systems PER HPI OTHERWISE ALL SYSTEMS ARE NEGATIVE.     Objective:   Physical Exam  Constitutional: She is oriented to person, place, and time. She appears well-developed and well-nourished. No distress.  HENT:  Head: Normocephalic and atraumatic.  Mouth/Throat: Oropharynx is clear and moist. No oropharyngeal exudate.  Eyes: Pupils are equal, round, and  reactive to light. No scleral icterus.  Neck: Normal range of motion. Neck supple.  Cardiovascular: Normal rate, regular rhythm and normal heart sounds.  Pulmonary/Chest: Effort normal and breath sounds normal. No respiratory distress.  Abdominal: Soft. Bowel sounds are normal. She exhibits no distension. There is no tenderness.  Musculoskeletal: She exhibits no edema.  Lymphadenopathy:    She has no cervical adenopathy.  Neurological: She is alert and oriented to person, place, and time.  NO  NEW FOCAL DEFICITS  Psychiatric:  SLIGHTLY ANXIOUS MOOD, NL AFFECT  Vitals reviewed.     Assessment & Plan:

## 2017-04-29 NOTE — Progress Notes (Signed)
CC'ED TO PCP 

## 2017-04-29 NOTE — Assessment & Plan Note (Signed)
SYMPTOMS CONTROLLED/RESOLVED.  CONTINUE OMEPRAZOLE.  TAKE 30 MINUTES PRIOR TO YOUR MEALS TWICE DAILY. CONTINUE TO MONITOR SYMPTOMS.

## 2017-04-29 NOTE — Assessment & Plan Note (Signed)
SYMPTOMS FAIRLY WELL CONTROLLED. OCCASIONAL NEED FOR PEPTO BISMOL.  PEPTO BISMOL IF NEEDED. CONTINUE OMEPRAZOLE.  TAKE 30 MINUTES PRIOR TO YOUR MEALS TWICE DAILY. FOLLOW UP IN 4 MOS.

## 2017-06-24 DIAGNOSIS — Z961 Presence of intraocular lens: Secondary | ICD-10-CM | POA: Diagnosis not present

## 2017-06-24 DIAGNOSIS — H1851 Endothelial corneal dystrophy: Secondary | ICD-10-CM | POA: Diagnosis not present

## 2017-06-24 DIAGNOSIS — H3554 Dystrophies primarily involving the retinal pigment epithelium: Secondary | ICD-10-CM | POA: Diagnosis not present

## 2017-07-02 ENCOUNTER — Other Ambulatory Visit (HOSPITAL_COMMUNITY): Payer: Self-pay | Admitting: Oncology

## 2017-07-02 DIAGNOSIS — C50412 Malignant neoplasm of upper-outer quadrant of left female breast: Secondary | ICD-10-CM

## 2017-07-07 ENCOUNTER — Encounter (HOSPITAL_COMMUNITY): Payer: Self-pay | Admitting: Adult Health

## 2017-07-07 ENCOUNTER — Other Ambulatory Visit: Payer: Self-pay

## 2017-07-07 ENCOUNTER — Inpatient Hospital Stay (HOSPITAL_COMMUNITY): Payer: Medicare HMO | Attending: Hematology and Oncology | Admitting: Adult Health

## 2017-07-07 ENCOUNTER — Inpatient Hospital Stay (HOSPITAL_COMMUNITY): Payer: Medicare HMO

## 2017-07-07 VITALS — BP 145/61 | HR 67 | Temp 98.2°F | Resp 20 | Ht 63.0 in | Wt 188.5 lb

## 2017-07-07 DIAGNOSIS — C50912 Malignant neoplasm of unspecified site of left female breast: Secondary | ICD-10-CM | POA: Insufficient documentation

## 2017-07-07 DIAGNOSIS — Z8542 Personal history of malignant neoplasm of other parts of uterus: Secondary | ICD-10-CM | POA: Insufficient documentation

## 2017-07-07 DIAGNOSIS — Z170 Estrogen receptor positive status [ER+]: Principal | ICD-10-CM

## 2017-07-07 DIAGNOSIS — Z79810 Long term (current) use of selective estrogen receptor modulators (SERMs): Secondary | ICD-10-CM | POA: Diagnosis not present

## 2017-07-07 DIAGNOSIS — C50419 Malignant neoplasm of upper-outer quadrant of unspecified female breast: Secondary | ICD-10-CM

## 2017-07-07 LAB — CBC WITH DIFFERENTIAL/PLATELET
Basophils Absolute: 0 10*3/uL (ref 0.0–0.1)
Basophils Relative: 1 %
Eosinophils Absolute: 0.2 10*3/uL (ref 0.0–0.7)
Eosinophils Relative: 3 %
HEMATOCRIT: 39.6 % (ref 36.0–46.0)
HEMOGLOBIN: 13.3 g/dL (ref 12.0–15.0)
LYMPHS ABS: 1.6 10*3/uL (ref 0.7–4.0)
LYMPHS PCT: 24 %
MCH: 32.2 pg (ref 26.0–34.0)
MCHC: 33.6 g/dL (ref 30.0–36.0)
MCV: 95.9 fL (ref 78.0–100.0)
Monocytes Absolute: 0.5 10*3/uL (ref 0.1–1.0)
Monocytes Relative: 8 %
NEUTROS ABS: 4.2 10*3/uL (ref 1.7–7.7)
Neutrophils Relative %: 64 %
PLATELETS: 182 10*3/uL (ref 150–400)
RBC: 4.13 MIL/uL (ref 3.87–5.11)
RDW: 12.2 % (ref 11.5–15.5)
WBC: 6.4 10*3/uL (ref 4.0–10.5)

## 2017-07-07 LAB — COMPREHENSIVE METABOLIC PANEL
ALK PHOS: 70 U/L (ref 38–126)
ALT: 20 U/L (ref 14–54)
AST: 19 U/L (ref 15–41)
Albumin: 3.7 g/dL (ref 3.5–5.0)
Anion gap: 10 (ref 5–15)
BILIRUBIN TOTAL: 0.5 mg/dL (ref 0.3–1.2)
BUN: 18 mg/dL (ref 6–20)
CALCIUM: 8.7 mg/dL — AB (ref 8.9–10.3)
CHLORIDE: 102 mmol/L (ref 101–111)
CO2: 26 mmol/L (ref 22–32)
CREATININE: 0.83 mg/dL (ref 0.44–1.00)
GFR calc Af Amer: >60 mL/min (ref >60)
GFR calc non Af Amer: >60 mL/min (ref >60)
Glucose, Bld: 135 mg/dL — ABNORMAL HIGH (ref 65–99)
Potassium: 4.2 mmol/L (ref 3.5–5.1)
Sodium: 138 mmol/L (ref 135–145)
Total Protein: 6.4 g/dL — ABNORMAL LOW (ref 6.5–8.1)

## 2017-07-07 NOTE — Progress Notes (Signed)
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 Laverle Patter note on 01/01/17)      INTERVAL HISTORY:  [name] 77 y.o. female returns for routine follow-up for left breast cancer.   Overall, she tells me she has been feeling well. Appetite 100%; energy levels 75%.    Remains on Tamoxifen. Her only complaint is occasional hot flashes, generally 2-3 times per day. She also has occasional night sweats, which she attributes to hot flashes as well. They are manageable for her right now; she is not interested in any additional medications right now.    Denies any new breast concerns. Last mammogram was in 12/2016 and negative. Last DEXA was in 11/2016 with normal bone density. She takes calcium/vitamin D supplementation.  Denies vaginal bleeding.   Her PCP is Dr. Legrand Rams; she sees him regularly and she tells me that he routinely checks blood work.   Otherwise, she is largely without complaints today.     REVIEW OF SYSTEMS:  Review of Systems  Constitutional: Positive for fatigue. Negative for chills and fever.  HENT:  Negative.  Negative for lump/mass and nosebleeds.   Eyes: Negative.   Respiratory: Negative.  Negative for cough and shortness of breath.   Cardiovascular: Negative.  Negative for chest pain and leg swelling.  Gastrointestinal: Negative.  Negative for abdominal pain, blood in stool, constipation, diarrhea, nausea and vomiting.  Endocrine: Positive for hot flashes.  Genitourinary: Negative.  Negative for dysuria and hematuria.   Musculoskeletal: Negative.  Negative for arthralgias.  Skin: Negative.   Negative for rash.  Neurological: Negative.  Negative for dizziness and headaches.  Hematological: Negative.  Negative for adenopathy. Does not bruise/bleed easily.  Psychiatric/Behavioral: Negative.  Negative for depression and sleep disturbance. The patient is not nervous/anxious.      PAST MEDICAL/SURGICAL HISTORY:  Past Medical History:  Diagnosis Date  . Breast cancer ([place])   . Breast cancer ([place])   . Chronic back pain   . Gastric ulcer    History  . GERD (gastroesophageal reflux disease) 01/21/2006   EGD Dr Oneida Alar mild chronic gastritis, (NO h pylori) otherwise normal  . Headache(784.0)   . Sinus congestion   . Thumb tendonitis September 2014   left  . Uterine cancer ([place]) 1998  . Wears partial dentures    Past Surgical History:  Procedure Laterality Date  . ABDOMINAL HYSTERECTOMY    . ABSCESS DRAINAGE  03-04-05   insect bite  . CARDIAC CATHETERIZATION N/A 10/31/2014   Procedure: Right/Left Heart Cath and Coronary Angiography;  Surgeon: Troy Sine, MD;  Location: [facility];  Service: Cardiovascular;  Laterality: N/A;  . CATARACT EXTRACTION W/PHACO  10/06/2011   Procedure: CATARACT EXTRACTION PHACO AND INTRAOCULAR LENS PLACEMENT (IOC);  Surgeon: Williams Che, MD;  Location: [facility];  Service: Ophthalmology;  Laterality: Left;  CDE:  5.76  . cataracts    . CHOLECYSTECTOMY  11/1999  . COLONOSCOPY  05/13/2011   Procedure: COLONOSCOPY;  Surgeon: Dorothyann Peng, MD;  Location: [facility];  Service: Endoscopy;  Laterality: N/A;  8:30  . COLONOSCOPY N/A 04/09/2017   Procedure: COLONOSCOPY;  Surgeon: Danie Binder, MD;  [facility];  Service: Endoscopy;  Laterality: N/A;  8:30am  . ESOPHAGOGASTRODUODENOSCOPY  01/21/06   mild antral erythema bx h-pylori/normal esophagus without evidence of mass or Barrett's/normal pylorus and duodenum  . PARTIAL MASTECTOMY WITH NEEDLE LOCALIZATION AND AXILLARY SENTINEL LYMPH NODE BX Left 08/18/2012   Procedure: PARTIAL  MASTECTOMY WITH NEEDLE LOCALIZATION AND AXILLARY SENTINEL LYMPH NODE BX;  Surgeon: Jamesetta So, MD;  Location: [facility];  Service: General;  Laterality: Left;  Need Frozen Section/Sentinel Node Bx @ 8:00am/Needle Loc @ 9:00am  . POLYPECTOMY  04/09/2017   Procedure: POLYPECTOMY;  Surgeon: Danie Binder, MD;  [facility];  Service: Endoscopy;;  Rectal  . S/P Hysterectomy  1998   uterine ca  . temperal/mandibular  1974  . TONSILLECTOMY  1964  . TRIGGER FINGER RELEASE Right 05/04/2014   Procedure: RELEASE TRIGGER FINGER/A-1 PULLEY RIGHT RING FINGER;  Surgeon: Daryll Brod, MD;  Location: [facility];  Service: Orthopedics;  Laterality: Right;     SOCIAL HISTORY:  Social History   Socioeconomic History  . Marital status: Widowed    Spouse name: Not on file  . Number of children: 1  . Years of education: Not on file  . Highest education level: Not on file  Social Needs  . Financial resource strain: Not on file  . Food insecurity - worry: Not on file  . Food insecurity - inability: Not on file  . Transportation needs - medical: Not on file  . Transportation needs - non-medical: Not on file  Occupational History  . Occupation: retired; Facilities manager asst    Employer: RETIRED  Tobacco Use  . Smoking status: Never Smoker  . Smokeless tobacco: Never Used  Substance and Sexual Activity  . Alcohol use: No  . Drug use: No  . Sexual activity: Not Currently    Birth control/protection: Surgical  Other Topics Concern  . Not on file  Social History Narrative   1 adopted son-grown   Lives alone    FAMILY HISTORY:  Family History  Problem Relation Age of Onset  . Liver cancer Father   . Alcohol abuse Father   . Hypertension Mother   . COPD Mother   . Diabetes Brother     CURRENT MEDICATIONS:  Outpatient Encounter Medications as of 07/07/2017  Medication Sig Note  . acetaminophen (TYLENOL) 325 MG tablet Take 650 mg by mouth every 6 (six) hours as needed (for  pain.).   Marland Kitchen B Complex-C (SUPER B COMPLEX PO) Take 1 capsule by mouth daily.    . Calcium Carb-Cholecalciferol (CALCIUM 600/VITAMIN D3) 600-800 MG-UNIT TABS Take 1 capsule by mouth 2 (two) times daily.   . ciclopirox (PENLAC) 8 % solution Apply 1 application topically 2 (two) times daily. Apply over nail and surrounding skin. Apply daily over previous coat. After seven (7) days, may remove with alcohol and continue cycle.    . diclofenac sodium (VOLTAREN) 1 % GEL Apply 2-4 g topically 3 (three) times daily as needed (for pain.).  04/02/2017: seldom  . diphenhydrAMINE (BENADRYL) 25 mg capsule Take 25 mg by mouth at bedtime.   . Glucosamine-Chondroitin (COSAMIN DS PO) Take 1 tablet by mouth daily.   Marland Kitchen ipratropium (ATROVENT) 0.03 % nasal spray Place 2 sprays into the nose 2 (two) times daily as needed (for allergies.).    Marland Kitchen loperamide (IMODIUM) 2 MG capsule Take 2 mg by mouth 4 (four) times daily as needed for diarrhea or loose  stools.    . Lutein 40 MG CAPS Take 40 mg by mouth daily.   . Multiple Vitamin (MULTIVITAMIN WITH MINERALS) TABS tablet Take 1 tablet by mouth daily.   Marland Kitchen omeprazole (PRILOSEC) 20 MG capsule Take 20 mg by mouth 2 (two) times daily before a meal.    . tamoxifen (NOLVADEX) 20 MG tablet TAKE ONE TABLET BY MOUTH DAILY.   . Turmeric 500 MG CAPS Take 500 mg by mouth daily.    No facility-administered encounter medications on file as of 07/07/2017.     ALLERGIES:  Allergies  Allergen Reactions  . Aspirin Other (See Comments)    REACTION: Abdominal pain   . Iron Anaphylaxis    REACTION: abdominal pain   . Sulfa Antibiotics Rash    Rash all over  . Sulfasalazine Rash    Rash all over  . Ancef [Cefazolin] Rash     PHYSICAL EXAM:  ECOG Performance status: 0 - 1 - Mild symptoms; remains independent    Vitals:   07/07/17 1150  BP: (!) 145/61  Pulse: 67  Resp: 20  Temp: 98.2 F (36.8 C)  SpO2: 99%   Filed Weights   07/07/17 1150  Weight: 188 lb 8 oz (85.5 kg)     Physical Exam  Constitutional: She is oriented to person, place, and time and well-developed, well-nourished, and in no distress.  HENT:  Head: Normocephalic.  Mouth/Throat: Oropharynx is clear and moist. No oropharyngeal exudate.  Eyes: Conjunctivae are normal. Pupils are equal, round, and reactive to light. No scleral icterus.  Neck: Normal range of motion. Neck supple.  Cardiovascular: Normal rate and regular rhythm.  Pulmonary/Chest: Effort normal and breath sounds normal. No respiratory distress. She has no wheezes.    Abdominal: Soft. Bowel sounds are normal. There is no tenderness.  Musculoskeletal: Normal range of motion. She exhibits no edema.  Lymphadenopathy:    She has no cervical adenopathy.       Right: No supraclavicular adenopathy present.       Left: No supraclavicular adenopathy present.  Neurological: She is alert and oriented to person, place, and time. No cranial nerve deficit. Gait normal.  Skin: Skin is warm and dry. No rash noted.  Psychiatric: Mood, memory, affect and judgment normal.  Nursing note and vitals reviewed.    LABORATORY DATA:  I have reviewed the labs as listed.  CBC    Component Value Date/Time   WBC 6.4 07/07/2017 0945   RBC 4.13 07/07/2017 0945   HGB 13.3 07/07/2017 0945   HCT 39.6 07/07/2017 0945   PLT 182 07/07/2017 0945   MCV 95.9 07/07/2017 0945   MCH 32.2 07/07/2017 0945   MCHC 33.6 07/07/2017 0945   RDW 12.2 07/07/2017 0945   LYMPHSABS 1.6 07/07/2017 0945   MONOABS 0.5 07/07/2017 0945   EOSABS 0.2 07/07/2017 0945   BASOSABS 0.0 07/07/2017 0945   CMP Latest Ref Rng & Units 07/07/2017 11/30/2014 10/31/2014  Glucose 65 - 99 mg/dL 135(H) 90 108(H)  BUN 6 - 20 mg/dL '18 17 19  '$ Creatinine 0.44 - 1.00 mg/dL 0.83 0.75 0.87  Sodium 135 - 145 mmol/L 138 139 140  Potassium 3.5 - 5.1 mmol/L 4.2 4.3 4.2  Chloride 101 - 111 mmol/L 102 104 104  CO2 22 - 32 mmol/L '26 26 24  '$ Calcium 8.9 - 10.3 mg/dL 8.7(L) 9.2 8.9  Total Protein 6.5 -  8.1 g/dL 6.4(L) 6.7 -  Total Bilirubin 0.3 - 1.2 mg/dL 0.5 0.5 -  Alkaline Phos 38 -  126 U/L 70 120 -  AST 15 - 41 U/L 19 22 -  ALT 14 - 54 U/L 20 29 -    PENDING LABS:    DIAGNOSTIC IMAGING:  *The following radiologic images and reports have been reviewed independently and agree with below findings.  Last mammogram: 12/23/16 CLINICAL DATA:  History of left breast cancer, diagnosed in 2014. Annual examination. The patient had a lumpectomy in 2014, followed by radiation therapy.  EXAM: 2D DIGITAL DIAGNOSTIC BILATERAL MAMMOGRAM WITH CAD AND ADJUNCT TOMO  COMPARISON:  Previous exam(s).  ACR Breast Density Category b: There are scattered areas of fibroglandular density.  FINDINGS: Stable lumpectomy changes in the lateral left breast, posterior third. Vascular calcifications are present in both breasts. Skin thickening in the left breast is compatible with radiation change. There are surgical clips in the left axilla. No mass, nonsurgical distortion, or suspicious microcalcification is identified in either breast to suggest malignancy.  Mammographic images were processed with CAD.  IMPRESSION: No evidence of malignancy in either breast. Lumpectomy and radiation changes in the left breast.  RECOMMENDATION: Diagnostic mammogram is suggested in 1 year. (Code:[id number]-B-01Y)  I have discussed the findings and recommendations with the patient. Results were also provided in writing at the conclusion of the visit. If applicable, a reminder letter will be sent to the patient regarding the next appointment.  BI-RADS CATEGORY  2: Benign.   Electronically Signed   By: Curlene Dolphin M.D.   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 Treated with (L) lumpectomy, followed by adjuvant radiation therapy.  Initially  started anti-estrogen therapy with Arimidex in 12/2012. She was not able to tolerate Arimidex d/t severe fatigue, so she was switched to Tamoxifen in 05/2015.   -Remains on Tamoxifen with good tolerance; biggest side effects are hot flashes/night sweats. Per patient, her symptoms are manageable and she does not feel like she needs additional medication for the hot flashes.   -Initial planned duration of anti-estrogen treatment was 5 years for Ms. Kleinman.  We have learned that some women may benefit from the extension of anti-estrogen therapy for 10 years instead of 5 years.  We discussed that the Breast Cancer Index (BCI) genomic testing helps both patients and providers decide which patients may gain the most benefit from extended anti-estrogen use.  I explained that the BCI would help [place] better understand, based on her cancer's biology, if she is 1) at high or low risk for recurrence & 2) whether there is high or low likelihood of benefit from extension of therapy beyond 5 years.  We discussed that there can sometimes be discordant results (i.e. High risk of recurrence, but low likelihood of benefit from extension of therapy).  If this is the case, then having a risk/benefit discussion with the patient will be important at that time. She agreed to proceed with BCI testing and orders placed today.  She understands that it may take several weeks to get the results of the BCI testing; we will review in more detail at her next follow-up visit.   -Last diagnostic mammogram 12/23/16 negative for malignancy. Will be due for annual mammogram in 12/2017; orders placed today. She is aware that she will have diagnostic mammograms for a total of 7 years post-lumpectomy per radiology protocol.   -Return  to cancer center in 12/2017 a few days after mammogram for follow-up; no oncologic reason for labs as her PCP periodically checks labs.   Bone health:  -Last DEXA scan on 12/12/16 showed normal bone density with T-score  -1.0. She understands that tamoxifen can actually increase bone density over time (when compared to aromatase inhibitors).  -Recommended continued calcium/vitamin D supplementation and weight-bearing exercises as tolerated.   Health maintenance/Wellness:  -Encouraged continued follow-up with PCP as directed for health maintenance and management of other comorbid conditions.     Dispo:  -BCI ordered today.  -Mammogram in 12/2017; orders placed today.  -Return to cancer center in 12/2017 a few days after mammogram for follow-up; no labs needed.    All questions were answered to patient's stated satisfaction. Encouraged patient to call with any new concerns or questions before her next visit to the cancer center and we can certain see her sooner, if needed.      Orders placed this encounter:  Orders Placed This Encounter  Procedures  . MM DIAG BREAST TOMO BILATERAL      Mike Craze, NP [place] [phone]

## 2017-07-08 ENCOUNTER — Encounter (HOSPITAL_COMMUNITY): Payer: Self-pay | Admitting: Emergency Medicine

## 2017-07-08 DIAGNOSIS — M19041 Primary osteoarthritis, right hand: Secondary | ICD-10-CM | POA: Diagnosis not present

## 2017-07-08 NOTE — Progress Notes (Signed)
BCI ordered and faxed to [facility].  Fax confirmed.

## 2017-07-17 DIAGNOSIS — C50412 Malignant neoplasm of upper-outer quadrant of left female breast: Secondary | ICD-10-CM | POA: Diagnosis not present

## 2017-07-20 ENCOUNTER — Encounter (HOSPITAL_COMMUNITY): Payer: Self-pay | Admitting: Adult Health

## 2017-07-20 NOTE — Progress Notes (Signed)
BCI results received and are as follows:   LOW risk of recurrence (3%) and LOW likelihood of benefit of extension of anti-estrogen therapy beyond 5 years.    Based on these results, she can stop anti-estrogen therapy in 12/2017, as this will complete 5 years of therapy. Will review in more detail at her follow-up visit in July, but will have nursing call her to give her these results.    Mike Craze, NP [place] [phone]

## 2017-07-22 ENCOUNTER — Telehealth (HOSPITAL_COMMUNITY): Payer: Self-pay | Admitting: Emergency Medicine

## 2017-07-22 NOTE — Telephone Encounter (Signed)
Called pt to let her know the results of her BCI testing showed low benefit of extending AI therapy beyond 5 years and based on these results she could stop taking her AI therapy in July.  Pt was thankful for the call.

## 2017-07-22 NOTE — Telephone Encounter (Signed)
-----   Message from Holley Bouche, NP sent at 07/20/2017  1:11 PM EST ----- Will you call the patient and let her know about these results?  She can stop her anti-estrogen therapy in July based on these results. We can always discuss more at her next follow-up visit too, just wanted to give her the results via phone. Thank you! -gwd

## 2017-07-23 ENCOUNTER — Encounter (HOSPITAL_COMMUNITY): Payer: Self-pay

## 2017-08-26 ENCOUNTER — Encounter: Payer: Self-pay | Admitting: Gastroenterology

## 2017-08-26 ENCOUNTER — Ambulatory Visit (INDEPENDENT_AMBULATORY_CARE_PROVIDER_SITE_OTHER): Payer: Medicare HMO | Admitting: Gastroenterology

## 2017-08-26 VITALS — BP 149/77 | HR 79 | Temp 97.1°F | Ht 63.0 in | Wt 189.0 lb

## 2017-08-26 DIAGNOSIS — R197 Diarrhea, unspecified: Secondary | ICD-10-CM

## 2017-08-26 DIAGNOSIS — K219 Gastro-esophageal reflux disease without esophagitis: Secondary | ICD-10-CM

## 2017-08-26 NOTE — Assessment & Plan Note (Signed)
Chronic diarrhea noted since radiation treatments for uterine cancer in the late 1990s. Recently associated with upper abdominal discomfort/cramping. The symptoms have resolved since she was advised to decrease dairy consumption. She did not tolerate lactase stating it caused too much gas. She is cut back on her milk and cheese. She has eliminated her yogurt. She states her abdominal pain is completely resolved. She continues to have mostly morning loose stools, 3 to 4 times. She uses Imodium only for days that she goes out. She is satisfied with her current management does not want to take daily regimen. No unintentional weight loss.  Turn to the office in six months or call sooner if needed.

## 2017-08-26 NOTE — Assessment & Plan Note (Signed)
Well-controlled on current regimen. ?

## 2017-08-26 NOTE — Patient Instructions (Signed)
1. Return to the office in six months or call sooner if needed.

## 2017-08-26 NOTE — Progress Notes (Signed)
CC'ED TO PCP 

## 2017-08-26 NOTE — Progress Notes (Signed)
Primary Care Physician: Asencion Noble, MD  Primary Gastroenterologist:  Barney Drain, MD   Chief Complaint  Patient presents with  . Gastroesophageal Reflux    f/u. Doing better    HPI: Kayla Thomas is a 77 y.o. female here for follow-up. Last seen in November 2019 for chronic diarrhea and Gerd. She has a history of chronic diarrhea starting after XRT in the late 90s for uterine cancer. Colonoscopy completed in October 2018 for history of colon polyps. She had a single tubular adenoma removed, redundant left colon, internal/external hemorrhoids.  Upper abdominal cramping pain is completely gone now that she quit eating yogurt and cut back on  Cheese/milk. If she has no plans for the day, she does not take the Imodium. She'll usually have three or four loose stools in the morning. If she plans on going out she'll take three Imodium. The next day she still has a bowel movement although first stool is somewhat hard. No melena rectal bleeding. No unintentional weight loss.Takes omeprazole BID and reflux as well controlled. She notes that she skips a day without medication that she has recurrent symptoms.    Current Outpatient Medications  Medication Sig Dispense Refill  . acetaminophen (TYLENOL) 325 MG tablet Take 650 mg by mouth every 6 (six) hours as needed (for pain.).    Marland Kitchen B Complex-C (SUPER B COMPLEX PO) Take 1 capsule by mouth daily.     . Calcium Carb-Cholecalciferol (CALCIUM 600/VITAMIN D3) 600-800 MG-UNIT TABS Take 1 capsule by mouth 2 (two) times daily.    . ciclopirox (PENLAC) 8 % solution Apply 1 application topically 2 (two) times daily. Apply over nail and surrounding skin. Apply daily over previous coat. After seven (7) days, may remove with alcohol and continue cycle.     . diclofenac sodium (VOLTAREN) 1 % GEL Apply 2-4 g topically 3 (three) times daily as needed (for pain.).     Marland Kitchen diphenhydrAMINE (BENADRYL) 25 mg capsule Take 25 mg by mouth at bedtime.    .  Glucosamine-Chondroitin (COSAMIN DS PO) Take 1 tablet by mouth daily.    Marland Kitchen ipratropium (ATROVENT) 0.03 % nasal spray Place 2 sprays into the nose 2 (two) times daily as needed (for allergies.).     Marland Kitchen loperamide (IMODIUM) 2 MG capsule Take 2 mg by mouth 4 (four) times daily as needed for diarrhea or loose stools.     . Lutein 40 MG CAPS Take 40 mg by mouth daily.    . Multiple Vitamin (MULTIVITAMIN WITH MINERALS) TABS tablet Take 1 tablet by mouth daily.    Marland Kitchen omeprazole (PRILOSEC) 20 MG capsule Take 20 mg by mouth 2 (two) times daily before a meal.     . tamoxifen (NOLVADEX) 20 MG tablet TAKE ONE TABLET BY MOUTH DAILY. 30 tablet 0  . Turmeric 500 MG CAPS Take 500 mg by mouth daily.     No current facility-administered medications for this visit.     Allergies as of 08/26/2017 - Review Complete 08/26/2017  Allergen Reaction Noted  . Aspirin Other (See Comments)   . Iron Anaphylaxis   . Sulfa antibiotics Rash 04/22/2011  . Sulfasalazine Rash 04/22/2011  . Ancef [cefazolin] Rash 08/28/2012    ROS:  General: Negative for anorexia, weight loss, fever, chills, fatigue, weakness. ENT: Negative for hoarseness, difficulty swallowing , nasal congestion. CV: Negative for chest pain, angina, palpitations, dyspnea on exertion, peripheral edema.  Respiratory: Negative for dyspnea at rest, dyspnea on exertion, cough, sputum, wheezing.  GI: See history of present illness. GU:  Negative for dysuria, hematuria, urinary incontinence, urinary frequency, nocturnal urination.  Endo: Negative for unusual weight change.    Physical Examination:   BP (!) 149/77   Pulse 79   Temp (!) 97.1 F (36.2 C) (Oral)   Ht 5\' 3"  (1.6 m)   Wt 189 lb (85.7 kg)   BMI 33.48 kg/m   General: Well-nourished, well-developed in no acute distress.  Eyes: No icterus. Mouth: Oropharyngeal mucosa moist and pink , no lesions erythema or exudate. Lungs: Clear to auscultation bilaterally.  Heart: Regular rate and rhythm, no  murmurs rubs or gallops.  Abdomen: Bowel sounds are normal, nontender, nondistended, no hepatosplenomegaly or masses, no abdominal bruits or hernia , no rebound or guarding.   Extremities: No lower extremity edema. No clubbing or deformities. Neuro: Alert and oriented x 4   Skin: Warm and dry, no jaundice.   Psych: Alert and cooperative, normal mood and affect.  Labs:  Lab Results  Component Value Date   CREATININE 0.83 07/07/2017   BUN 18 07/07/2017   NA 138 07/07/2017   K 4.2 07/07/2017   CL 102 07/07/2017   CO2 26 07/07/2017   Lab Results  Component Value Date   ALT 20 07/07/2017   AST 19 07/07/2017   ALKPHOS 70 07/07/2017   BILITOT 0.5 07/07/2017   Lab Results  Component Value Date   WBC 6.4 07/07/2017   HGB 13.3 07/07/2017   HCT 39.6 07/07/2017   MCV 95.9 07/07/2017   PLT 182 07/07/2017    Imaging Studies: No results found.

## 2017-12-04 DIAGNOSIS — Z6833 Body mass index (BMI) 33.0-33.9, adult: Secondary | ICD-10-CM | POA: Diagnosis not present

## 2017-12-04 DIAGNOSIS — L309 Dermatitis, unspecified: Secondary | ICD-10-CM | POA: Diagnosis not present

## 2017-12-29 DIAGNOSIS — E785 Hyperlipidemia, unspecified: Secondary | ICD-10-CM | POA: Diagnosis not present

## 2017-12-29 DIAGNOSIS — Z79899 Other long term (current) drug therapy: Secondary | ICD-10-CM | POA: Diagnosis not present

## 2017-12-29 DIAGNOSIS — K219 Gastro-esophageal reflux disease without esophagitis: Secondary | ICD-10-CM | POA: Diagnosis not present

## 2017-12-29 DIAGNOSIS — G43909 Migraine, unspecified, not intractable, without status migrainosus: Secondary | ICD-10-CM | POA: Diagnosis not present

## 2017-12-29 DIAGNOSIS — R7301 Impaired fasting glucose: Secondary | ICD-10-CM | POA: Diagnosis not present

## 2017-12-29 DIAGNOSIS — M1990 Unspecified osteoarthritis, unspecified site: Secondary | ICD-10-CM | POA: Diagnosis not present

## 2017-12-29 DIAGNOSIS — C50912 Malignant neoplasm of unspecified site of left female breast: Secondary | ICD-10-CM | POA: Diagnosis not present

## 2017-12-30 ENCOUNTER — Other Ambulatory Visit (HOSPITAL_COMMUNITY): Payer: Self-pay | Admitting: Adult Health

## 2017-12-30 ENCOUNTER — Other Ambulatory Visit (HOSPITAL_COMMUNITY): Payer: Self-pay | Admitting: Hematology

## 2017-12-30 DIAGNOSIS — Z9889 Other specified postprocedural states: Secondary | ICD-10-CM

## 2018-01-05 ENCOUNTER — Encounter (HOSPITAL_COMMUNITY): Payer: Self-pay

## 2018-01-05 ENCOUNTER — Ambulatory Visit (HOSPITAL_COMMUNITY)
Admission: RE | Admit: 2018-01-05 | Discharge: 2018-01-05 | Disposition: A | Discharge status: Home / Self Care | Payer: Medicare HMO | Source: Ambulatory Visit | Attending: Adult Health | Admitting: Adult Health

## 2018-01-05 DIAGNOSIS — K210 Gastro-esophageal reflux disease with esophagitis: Secondary | ICD-10-CM | POA: Diagnosis not present

## 2018-01-05 DIAGNOSIS — Z853 Personal history of malignant neoplasm of breast: Secondary | ICD-10-CM | POA: Insufficient documentation

## 2018-01-05 DIAGNOSIS — Z6834 Body mass index (BMI) 34.0-34.9, adult: Secondary | ICD-10-CM | POA: Diagnosis not present

## 2018-01-05 DIAGNOSIS — Z0001 Encounter for general adult medical examination with abnormal findings: Secondary | ICD-10-CM | POA: Diagnosis not present

## 2018-01-05 DIAGNOSIS — Z170 Estrogen receptor positive status [ER+]: Secondary | ICD-10-CM | POA: Diagnosis not present

## 2018-01-05 DIAGNOSIS — R7303 Prediabetes: Secondary | ICD-10-CM | POA: Diagnosis not present

## 2018-01-05 DIAGNOSIS — C50419 Malignant neoplasm of upper-outer quadrant of unspecified female breast: Secondary | ICD-10-CM | POA: Diagnosis not present

## 2018-01-05 DIAGNOSIS — R928 Other abnormal and inconclusive findings on diagnostic imaging of breast: Secondary | ICD-10-CM | POA: Diagnosis not present

## 2018-01-06 DIAGNOSIS — M19041 Primary osteoarthritis, right hand: Secondary | ICD-10-CM | POA: Diagnosis not present

## 2018-01-07 ENCOUNTER — Ambulatory Visit (HOSPITAL_COMMUNITY): Payer: Medicare HMO | Admitting: Adult Health

## 2018-01-07 ENCOUNTER — Encounter (HOSPITAL_COMMUNITY): Payer: Self-pay | Admitting: Internal Medicine

## 2018-01-07 ENCOUNTER — Inpatient Hospital Stay (HOSPITAL_COMMUNITY): Payer: Medicare HMO | Attending: Internal Medicine | Admitting: Internal Medicine

## 2018-01-07 VITALS — BP 142/65 | HR 71 | Temp 98.0°F | Resp 16 | Wt 192.3 lb

## 2018-01-07 DIAGNOSIS — Z90710 Acquired absence of both cervix and uterus: Secondary | ICD-10-CM

## 2018-01-07 DIAGNOSIS — C50419 Malignant neoplasm of upper-outer quadrant of unspecified female breast: Secondary | ICD-10-CM

## 2018-01-07 DIAGNOSIS — Z170 Estrogen receptor positive status [ER+]: Secondary | ICD-10-CM | POA: Diagnosis not present

## 2018-01-07 DIAGNOSIS — Z8542 Personal history of malignant neoplasm of other parts of uterus: Secondary | ICD-10-CM | POA: Insufficient documentation

## 2018-01-07 DIAGNOSIS — Z79810 Long term (current) use of selective estrogen receptor modulators (SERMs): Secondary | ICD-10-CM | POA: Diagnosis not present

## 2018-01-07 DIAGNOSIS — R197 Diarrhea, unspecified: Secondary | ICD-10-CM

## 2018-01-07 DIAGNOSIS — Z923 Personal history of irradiation: Secondary | ICD-10-CM | POA: Diagnosis not present

## 2018-01-07 DIAGNOSIS — C50412 Malignant neoplasm of upper-outer quadrant of left female breast: Secondary | ICD-10-CM | POA: Diagnosis not present

## 2018-01-07 NOTE — Patient Instructions (Signed)
[place] at [facility] Discharge Instructions  You saw Dr. Walden Field today. Stop Tamoxifen when you finish this bottle. Follow up after mammogram next year.   Thank you for choosing [place] at [facility] to provide your oncology and hematology care.  To afford each patient quality time with our provider, please arrive at least 15 minutes before your scheduled appointment time.   If you have a lab appointment with the [facility] please come in thru the  Main Entrance and check in at the main information desk  You need to re-schedule your appointment should you arrive 10 or more minutes late.  We strive to give you quality time with our providers, and arriving late affects you and other patients whose appointments are after yours.  Also, if you no show three or more times for appointments you may be dismissed from the clinic at the providers discretion.     Again, thank you for choosing [facility].  Our hope is that these requests will decrease the amount of time that you wait before being seen by our physicians.       _____________________________________________________________  Should you have questions after your visit to [facility], please contact our office at [phone] between the hours of 8:30 a.m. and 4:30 p.m.  Voicemails left after 4:30 p.m. will not be returned until the following business day.  For prescription refill requests, have your pharmacy contact our office.       Resources For Cancer Patients and their Caregivers ? American Cancer Society: Can assist with transportation, wigs, general needs, runs Look Good Feel Better.        815-618-6191 ? Cancer Care: Provides financial assistance, online support groups, medication/co-pay assistance.  1-800-813-HOPE 614-790-8498) ? [facility] Assists [place] cancer patients and their families through emotional , educational  and financial support.  406-108-5366 ? [place] DSS Where to apply for food stamps, Medicaid and utility assistance. 986-126-3808 ? [facility]: Transportation to medical appointments. (250) 603-0001 ? Social Security Administration: May apply for disability if have a Stage IV cancer. 562-207-0106 618-573-4105 ? [employer], Disability and Transit Services: Assists with nutrition, care and transit needs. [place] Support Programs:   > Cancer Support Group  2nd Tuesday of the month 1pm-2pm, Journey Room   > Creative Journey  3rd Tuesday of the month 1130am-1pm, Journey Room

## 2018-01-07 NOTE — Progress Notes (Signed)
Diagnosis Malignant neoplasm of upper-outer quadrant of breast in female, estrogen receptor positive, unspecified laterality ([place]) - Plan: MM DIAG BREAST TOMO BILATERAL  Staging Cancer Staging No matching staging information was found for the patient.  Assessment and Plan:  1.  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  Pt was diagnosed in 07/2012. Treated with (L) lumpectomy, followed by adjuvant radiation therapy.  Initially started anti-estrogen therapy with Arimidex in 12/2012. She was not able to tolerate Arimidex d/t severe fatigue, so she was switched to Tamoxifen in 05/2015.    Pt had BCI testing done on 07/13/2017 and showed a low likelihood of benefit with extended adjuvant therapy.    Pt had bilateral diagnostic mammogram done 01/05/2018 that was negative.  She will be set up for repeat imaging in 12/2018.  She is recommended for diagnostic mammograms for a total of 7 years post-lumpectomy per radiology protocol  She has been on endocrine therapy for 5 years and due to the low benefit of extended adjuvant therapy is is reasonable that she discontinue therapy.  She will continue to be followed and will be set up for bilateral diagnostic mammogram in 12/2018 and will follow-up at that time to go over results.    2.  Diarrhea.  Follow-up with GI if ongoing symptoms.    3.  Health maintenance.  Follow-up with GI as recommended.    Interval History:  Historical data obtained from the note dated 07/07/2017.  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  She was diagnosed in 07/2012. Treated with (L) lumpectomy, followed by adjuvant radiation therapy.  Initially started anti-estrogen therapy with Arimidex in 12/2012. She was not able to tolerate Arimidex d/t severe fatigue, so she was switched to Tamoxifen in 05/2015.    Current Status:  Pt is seen today for follow-up.  She is wondering if she can come off Tamoxifen.  She is here to go  over labs and mammogram.      Breast cancer [facility])    Initial Diagnosis    Breast cancer ([place])      07/13/2017 Miscellaneous    BCI results revealed LOW risk of late recurrence (3%) and LOW likelihood of benefit from extended anti-estrogen therapy.         Problem List Patient Active Problem List   Diagnosis Date Noted  . Polyp of rectum [K62.1]   . Vulval lesion benign, excised [N90.89] 12/22/2016  . 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 Closed nondisplaced fracture of distal phalanx of right middle finger [S62.662A] 02/27/2016  . Breast cancer ([place]) [C50.919] 04/30/2015  . Abnormal ECG [R94.31]   . DOE (dyspnea on exertion) [R06.09] 04/12/2014  . MIGRAINE HEADACHE [G43.909] 01/23/2009  . GERD [K21.9] 01/23/2009  . GASTRITIS [K29.70, K29.90] 01/23/2009  . LOOSE STOOLS [R19.7] 01/23/2009  . ALLERGY [T78.40XA] 01/23/2009  . UTERINE CANCER, HX OF [Z85.42] 01/23/2009    Past Medical History Past Medical History:  Diagnosis Date  . Breast cancer ([place])   . Breast cancer ([place])   . Chronic back pain   . Gastric ulcer    History  . GERD (gastroesophageal reflux disease) 01/21/2006   EGD Dr Oneida Alar mild chronic gastritis, (NO h pylori) otherwise normal  . Headache(784.0)   . Sinus congestion   . Thumb tendonitis September 2014   left  . Uterine cancer ([place]) 1998  . Wears partial dentures     Past Surgical History Past Surgical History:  Procedure Laterality Date  . ABDOMINAL HYSTERECTOMY    . ABSCESS DRAINAGE  03-04-05   insect bite  . CARDIAC CATHETERIZATION N/A 10/31/2014   Procedure: Right/Left Heart Cath and Coronary Angiography;  Surgeon: Troy Sine, MD;  Location: [place] CV LAB;  Service: Cardiovascular;  Laterality: N/A;  . CATARACT EXTRACTION W/PHACO  10/06/2011   Procedure: CATARACT EXTRACTION PHACO AND INTRAOCULAR LENS PLACEMENT (IOC);  Surgeon: Williams Che, MD;  Location: [facility] OR[facility];  Service: Ophthalmology;  Laterality: Left;   CDE:  5.76  . cataracts    . CHOLECYSTECTOMY  11/1999  . COLONOSCOPY  05/13/2011   Procedure: COLONOSCOPY;  Surgeon: Dorothyann Peng, MD;  Location: [facility];  Service: Endoscopy;  Laterality: N/A;  8:30  . COLONOSCOPY N/A 04/09/2017   Dr. Oneida Alar: single tubular adenoma removed from the colon, redundant left colon, internal and external hemorrhoids, next colonoscopy in 10 to 15 years.  . ESOPHAGOGASTRODUODENOSCOPY  01/21/06   mild antral erythema bx h-pylori/normal esophagus without evidence of mass or Barrett's/normal pylorus and duodenum  . PARTIAL MASTECTOMY WITH NEEDLE LOCALIZATION AND AXILLARY SENTINEL LYMPH NODE BX Left 08/18/2012   Procedure: PARTIAL MASTECTOMY WITH NEEDLE LOCALIZATION AND AXILLARY SENTINEL LYMPH NODE BX;  Surgeon: Jamesetta So, MD;  Location: [facility];  Service: General;  Laterality: Left;  Need Frozen Section/Sentinel Node Bx @ 8:00am/Needle Loc @ 9:00am  . POLYPECTOMY  04/09/2017   Procedure: POLYPECTOMY;  Surgeon: Danie Binder, MD;  [facility]: [facility];  Service: Endoscopy;;  Rectal  . S/P Hysterectomy  1998   uterine ca  . temperal/mandibular  1974  . TONSILLECTOMY  1964  . TRIGGER FINGER RELEASE Right 05/04/2014   Procedure: RELEASE TRIGGER FINGER/A-1 PULLEY RIGHT RING FINGER;  Surgeon: Daryll Brod, MD;  Location: [place];  Service: Orthopedics;  Laterality: Right;    Family History Family History  Problem Relation Age of Onset  . Liver cancer Father   . Alcohol abuse Father   . Hypertension Mother   . COPD Mother   . Diabetes Brother      Social History  reports that she has never smoked. She has never used smokeless tobacco. She reports that she does not drink alcohol or use drugs.  Medications  Current Outpatient Medications:  .  acetaminophen (TYLENOL) 325 MG tablet, Take 650 mg by mouth every 6 (six) hours as needed (for pain.)., Disp: , Rfl:  .  B Complex-C (SUPER B COMPLEX PO), Take 1 capsule by mouth daily. , Disp: ,  Rfl:  .  Calcium Carb-Cholecalciferol (CALCIUM 600/VITAMIN D3) 600-800 MG-UNIT TABS, Take 1 capsule by mouth 2 (two) times daily., Disp: , Rfl:  .  ciclopirox (PENLAC) 8 % solution, Apply 1 application topically 2 (two) times daily. Apply over nail and surrounding skin. Apply daily over previous coat. After seven (7) days, may remove with alcohol and continue cycle. , Disp: , Rfl:  .  diclofenac sodium (VOLTAREN) 1 % GEL, Apply 2-4 g topically 3 (three) times daily as needed (for pain.). , Disp: , Rfl:  .  diphenhydrAMINE (BENADRYL) 25 mg capsule, Take 25 mg by mouth at bedtime., Disp: , Rfl:  .  Glucosamine-Chondroitin (COSAMIN DS PO), Take 1 tablet by mouth daily., Disp: , Rfl:  .  ipratropium (ATROVENT) 0.03 % nasal spray, Place 2 sprays into the nose 2 (two) times daily as needed (for allergies.). , Disp: , Rfl:  .  loperamide (IMODIUM) 2 MG capsule, Take 2 mg by mouth  4 (four) times daily as needed for diarrhea or loose stools. , Disp: , Rfl:  .  Lutein 40 MG CAPS, Take 40 mg by mouth daily., Disp: , Rfl:  .  Multiple Vitamin (MULTIVITAMIN WITH MINERALS) TABS tablet, Take 1 tablet by mouth daily., Disp: , Rfl:  .  omeprazole (PRILOSEC) 20 MG capsule, Take 20 mg by mouth 2 (two) times daily before a meal. , Disp: , Rfl:  .  tamoxifen (NOLVADEX) 20 MG tablet, TAKE ONE TABLET BY MOUTH DAILY., Disp: 30 tablet, Rfl: 0 .  Turmeric 500 MG CAPS, Take 500 mg by mouth daily., Disp: , Rfl:   Allergies Aspirin; Iron; Sulfa antibiotics; Sulfasalazine; and Ancef [cefazolin]  Review of Systems Review of Systems - Oncology ROS negative other than diarrhea.     Physical Exam  Vitals Wt Readings from Last 3 Encounters:  01/07/18 192 lb 4.8 oz (87.2 kg)  08/26/17 189 lb (85.7 kg)  07/07/17 188 lb 8 oz (85.5 kg)   Temp Readings from Last 3 Encounters:  01/07/18 98 F (36.7 C) (Oral)  08/26/17 (!) 97.1 F (36.2 C) (Oral)  07/07/17 98.2 F (36.8 C) (Oral)   BP Readings from Last 3 Encounters:   01/07/18 (!) 142/65  08/26/17 (!) 149/77  07/07/17 (!) 145/61   Pulse Readings from Last 3 Encounters:  01/07/18 71  08/26/17 79  07/07/17 67   Constitutional: Well-developed, well-nourished, and in no distress.   HENT: Head: Normocephalic and atraumatic.  Mouth/Throat: No oropharyngeal exudate. Mucosa moist. Eyes: Pupils are equal, round, and reactive to light. Conjunctivae are normal. No scleral icterus.  Neck: Normal range of motion. Neck supple. No JVD present.  Cardiovascular: Normal rate, regular rhythm and normal heart sounds.  Exam reveals no gallop and no friction rub.   No murmur heard. Pulmonary/Chest: Effort normal and breath sounds normal. No respiratory distress. No wheezes.No rales.  Abdominal: Soft. Bowel sounds are normal. No distension. There is no tenderness. There is no guarding.  Musculoskeletal: No edema or tenderness.  Lymphadenopathy: No cervical, axillary or supraclavicular adenopathy.  Neurological: Alert and oriented to person, place, and time. No cranial nerve deficit.  Skin: Skin is warm and dry. No rash noted. No erythema. No pallor.  Psychiatric: Affect and judgment normal.  Bilateral breast exam:  Chaperone present.  Lumpectomy changes noted in left breast.  No dominant masses palpable bilaterally.    Labs No visits with results within 3 Day(s) from this visit.  Latest known visit with results is:  Appointment on 07/07/2017  Component Date Value Ref Range Status  . WBC 07/07/2017 6.4  4.0 - 10.5 K/uL Final  . RBC 07/07/2017 4.13  3.87 - 5.11 MIL/uL Final  . Hemoglobin 07/07/2017 13.3  12.0 - 15.0 g/dL Final  . HCT 07/07/2017 39.6  36.0 - 46.0 % Final  . MCV 07/07/2017 95.9  78.0 - 100.0 fL Final  . MCH 07/07/2017 32.2  26.0 - 34.0 pg Final  . MCHC 07/07/2017 33.6  30.0 - 36.0 g/dL Final  . RDW 07/07/2017 12.2  11.5 - 15.5 % Final  . Platelets 07/07/2017 182  150 - 400 K/uL Final  . Neutrophils Relative % 07/07/2017 64  % Final  . Neutro Abs  07/07/2017 4.2  1.7 - 7.7 K/uL Final  . Lymphocytes Relative 07/07/2017 24  % Final  . Lymphs Abs 07/07/2017 1.6  0.7 - 4.0 K/uL Final  . Monocytes Relative 07/07/2017 8  % Final  . Monocytes Absolute 07/07/2017 0.5  0.1 -  1.0 K/uL Final  . Eosinophils Relative 07/07/2017 3  % Final  . Eosinophils Absolute 07/07/2017 0.2  0.0 - 0.7 K/uL Final  . Basophils Relative 07/07/2017 1  % Final  . Basophils Absolute 07/07/2017 0.0  0.0 - 0.1 K/uL Final  . Sodium 07/07/2017 138  135 - 145 mmol/L Final  . Potassium 07/07/2017 4.2  3.5 - 5.1 mmol/L Final  . Chloride 07/07/2017 102  101 - 111 mmol/L Final  . CO2 07/07/2017 26  22 - 32 mmol/L Final  . Glucose, Bld 07/07/2017 135* 65 - 99 mg/dL Final  . BUN 07/07/2017 18  6 - 20 mg/dL Final  . Creatinine, Ser 07/07/2017 0.83  0.44 - 1.00 mg/dL Final  . Calcium 07/07/2017 8.7* 8.9 - 10.3 mg/dL Final  . Total Protein 07/07/2017 6.4* 6.5 - 8.1 g/dL Final  . Albumin 07/07/2017 3.7  3.5 - 5.0 g/dL Final  . AST 07/07/2017 19  15 - 41 U/L Final  . ALT 07/07/2017 20  14 - 54 U/L Final  . Alkaline Phosphatase 07/07/2017 70  38 - 126 U/L Final  . Total Bilirubin 07/07/2017 0.5  0.3 - 1.2 mg/dL Final  . GFR calc non Af Amer 07/07/2017 >60  >60 mL/min Final  . GFR calc Af Amer 07/07/2017 >60  >60 mL/min Final   Comment: (NOTE) The eGFR has been calculated using the CKD EPI equation. This calculation has not been validated in all clinical situations. eGFR's persistently <60 mL/min signify possible Chronic Kidney Disease.   . Anion gap 07/07/2017 10  5 - 15 Final     Pathology Orders Placed This Encounter  Procedures  . MM DIAG BREAST TOMO BILATERAL    Standing Status:   Future    Standing Expiration Date:   01/08/2020    Order Specific Question:   Reason for Exam (SYMPTOM  OR DIAGNOSIS REQUIRED)    Answer:   left breast cancer    Order Specific Question:   Preferred imaging location?    Answer:   [facility]       Zoila Shutter MD

## 2018-01-25 ENCOUNTER — Encounter: Payer: Self-pay | Admitting: Obstetrics and Gynecology

## 2018-01-25 ENCOUNTER — Ambulatory Visit (INDEPENDENT_AMBULATORY_CARE_PROVIDER_SITE_OTHER): Payer: Medicare HMO | Admitting: Obstetrics and Gynecology

## 2018-01-25 VITALS — BP 146/77 | HR 71 | Ht 64.25 in | Wt 190.2 lb

## 2018-01-25 DIAGNOSIS — Z8542 Personal history of malignant neoplasm of other parts of uterus: Secondary | ICD-10-CM

## 2018-01-25 DIAGNOSIS — Z853 Personal history of malignant neoplasm of breast: Secondary | ICD-10-CM | POA: Diagnosis not present

## 2018-01-25 DIAGNOSIS — Z01419 Encounter for gynecological examination (general) (routine) without abnormal findings: Secondary | ICD-10-CM

## 2018-01-25 NOTE — Addendum Note (Signed)
Addended by: Linton Rump on: 01/25/2018 10:12 AM   Modules accepted: Orders

## 2018-01-25 NOTE — Progress Notes (Signed)
Patient ID: Kayla Thomas, female   DOB: May 30, 1941, 77 y.o.   MRN: [id number]   Assessment:  Annual Gyn Exam 21 yr s/p endometrial cancer, with postop radiation therapy  Radiation associated diarrhea  Plan:  1. pap smear not done, next pap due 2 years 2. return annually or prn 3    Annual mammogram advised after age 28 Subjective:  Kayla Thomas is a 77 y.o. female [place] who presents for annual exam. No LMP recorded. Patient has had a hysterectomy. The patient has no complaints for today.  Patient has a history of uterine and breast cancer, Patient has abdominal hysterctomy  bso in 1998. Had endometriosis and excessive vaginal bleeding has postop radiation therapy  She has diarrhea issues in the morning but believes it is due to the radiation she had her for her uterine ca  The following portions of the patient's history were reviewed and updated as appropriate: allergies, current medications, past family history, past medical history, past social history, past surgical history and problem list. Past Medical History:  Diagnosis Date  . Breast cancer ([place])   . Breast cancer ([place])   . Chronic back pain   . Gastric ulcer    History  . GERD (gastroesophageal reflux disease) 01/21/2006   EGD Dr Oneida Alar mild chronic gastritis, (NO h pylori) otherwise normal  . Headache(784.0)   . Sinus congestion   . Thumb tendonitis September 2014   left  . Uterine cancer ([place]) 1998  . Wears partial dentures     Past Surgical History:  Procedure Laterality Date  . ABDOMINAL HYSTERECTOMY    . ABSCESS DRAINAGE  03-04-05   insect bite  . CARDIAC CATHETERIZATION N/A 10/31/2014   Procedure: Right/Left Heart Cath and Coronary Angiography;  Surgeon: Troy Sine, MD;  Location: [facility];  Service: Cardiovascular;  Laterality: N/A;  . CATARACT EXTRACTION W/PHACO  10/06/2011   Procedure: CATARACT EXTRACTION PHACO AND INTRAOCULAR LENS PLACEMENT (IOC);  Surgeon: Williams Che,  MD;  Location: [facility];  Service: Ophthalmology;  Laterality: Left;  CDE:  5.76  . cataracts    . CHOLECYSTECTOMY  11/1999  . COLONOSCOPY  05/13/2011   Procedure: COLONOSCOPY;  Surgeon: Dorothyann Peng, MD;  [facility];  Service: Endoscopy;  Laterality: N/A;  8:30  . COLONOSCOPY N/A 04/09/2017   Dr. Oneida Alar: single tubular adenoma removed from the colon, redundant left colon, internal and external hemorrhoids, next colonoscopy in 10 to 15 years.  . ESOPHAGOGASTRODUODENOSCOPY  01/21/06   mild antral erythema bx h-pylori/normal esophagus without evidence of mass or Barrett's/normal pylorus and duodenum  . PARTIAL MASTECTOMY WITH NEEDLE LOCALIZATION AND AXILLARY SENTINEL LYMPH NODE BX Left 08/18/2012   Procedure: PARTIAL MASTECTOMY WITH NEEDLE LOCALIZATION AND AXILLARY SENTINEL LYMPH NODE BX;  Surgeon: Jamesetta So, MD;  Location: [facility];  Service: General;  Laterality: Left;  Need Frozen Section/Sentinel Node Bx @ 8:00am/Needle Loc @ 9:00am  . POLYPECTOMY  04/09/2017   Procedure: POLYPECTOMY;  Surgeon: Danie Binder, MD;  [facility];  Service: Endoscopy;;  Rectal  . S/P Hysterectomy  1998   uterine ca  . temperal/mandibular  1974  . TONSILLECTOMY  1964  . TRIGGER FINGER RELEASE Right 05/04/2014   Procedure: RELEASE TRIGGER FINGER/A-1 PULLEY RIGHT RING FINGER;  Surgeon: Daryll Brod, MD;  Location: [facility];  Service: Orthopedics;  Laterality: Right;     Current Outpatient Medications:  .  acetaminophen (TYLENOL) 325 MG tablet, Take 650  mg by mouth every 6 (six) hours as needed (for pain.)., Disp: , Rfl:  .  B Complex-C (SUPER B COMPLEX PO), Take 1 capsule by mouth daily. , Disp: , Rfl:  .  Calcium Carb-Cholecalciferol (CALCIUM 600/VITAMIN D3) 600-800 MG-UNIT TABS, Take 1 capsule by mouth 2 (two) times daily., Disp: , Rfl:  .  ciclopirox (PENLAC) 8 % solution, Apply 1 application topically 2 (two) times daily. Apply over nail and surrounding skin. Apply  daily over previous coat. After seven (7) days, may remove with alcohol and continue cycle. , Disp: , Rfl:  .  diclofenac sodium (VOLTAREN) 1 % GEL, Apply 2-4 g topically 3 (three) times daily as needed (for pain.). , Disp: , Rfl:  .  diphenhydrAMINE (BENADRYL) 25 mg capsule, Take 25 mg by mouth at bedtime., Disp: , Rfl:  .  Glucosamine-Chondroitin (COSAMIN DS PO), Take 1 tablet by mouth daily., Disp: , Rfl:  .  ipratropium (ATROVENT) 0.03 % nasal spray, Place 2 sprays into the nose 2 (two) times daily as needed (for allergies.). , Disp: , Rfl:  .  loperamide (IMODIUM) 2 MG capsule, Take 2 mg by mouth 4 (four) times daily as needed for diarrhea or loose stools. , Disp: , Rfl:  .  Lutein 40 MG CAPS, Take 40 mg by mouth daily., Disp: , Rfl:  .  Multiple Vitamin (MULTIVITAMIN WITH MINERALS) TABS tablet, Take 1 tablet by mouth daily., Disp: , Rfl:  .  omeprazole (PRILOSEC) 20 MG capsule, Take 20 mg by mouth 2 (two) times daily before a meal. , Disp: , Rfl:  .  tamoxifen (NOLVADEX) 20 MG tablet, TAKE ONE TABLET BY MOUTH DAILY., Disp: 30 tablet, Rfl: 0 .  Turmeric 500 MG CAPS, Take 500 mg by mouth daily., Disp: , Rfl:   Review of Systems Constitutional: negative Gastrointestinal: negative Genitourinary: Consistent diarrhea  Objective:  There were no vitals taken for this visit.   BMI: There is no height or weight on file to calculate BMI.  General Appearance: Alert, appropriate appearance for age. No acute distress HEENT: Grossly normal Neck / Thyroid:  Cardiovascular: RRR; normal S1, S2, no murmur Lungs: CTA bilaterally Back: No CVAT Breast Exam:  left breast nipple retracted s/p radiation therapy , symmetric firmness left breast, right breast normal S/P endometrial cancer with  Sp/p breast cancer left breast Radiation associated diarrhea Gastrointestinal: Soft, non-tender, no masses or organomegaly, small inguinal hernia Pelvic Exam: VAGINA:  Vagina is short but smooth. CERVIX:  Surgically removed UTERUS: Surgically removed RECTAL: Hemoccult card given to do rectal  PAP: Not done Lymphatic Exam: Non-palpable nodes in neck, clavicular, axillary, or inguinal regions Skin: no rash or abnormalities Neurologic: Normal gait and speech, no tremor  Psychiatric: Alert and oriented, appropriate affect.  Urinalysis:n/a  By signing my name below, I, Samul Dada, attest that this documentation has been prepared under the direction and in the presence of Jonnie Kind, MD. Electronically Signed: Badger Lee. 01/25/18. 10:05 AM.

## 2018-01-28 ENCOUNTER — Encounter: Payer: Self-pay | Admitting: Gastroenterology

## 2018-03-23 DIAGNOSIS — Z23 Encounter for immunization: Secondary | ICD-10-CM | POA: Diagnosis not present

## 2018-03-23 DIAGNOSIS — H8113 Benign paroxysmal vertigo, bilateral: Secondary | ICD-10-CM | POA: Diagnosis not present

## 2018-07-01 DIAGNOSIS — Z961 Presence of intraocular lens: Secondary | ICD-10-CM | POA: Diagnosis not present

## 2018-07-01 DIAGNOSIS — H353121 Nonexudative age-related macular degeneration, left eye, early dry stage: Secondary | ICD-10-CM | POA: Diagnosis not present

## 2018-07-01 DIAGNOSIS — H3554 Dystrophies primarily involving the retinal pigment epithelium: Secondary | ICD-10-CM | POA: Diagnosis not present

## 2018-07-01 DIAGNOSIS — H43821 Vitreomacular adhesion, right eye: Secondary | ICD-10-CM | POA: Diagnosis not present

## 2018-08-30 DIAGNOSIS — Z6834 Body mass index (BMI) 34.0-34.9, adult: Secondary | ICD-10-CM | POA: Diagnosis not present

## 2018-08-30 DIAGNOSIS — M7061 Trochanteric bursitis, right hip: Secondary | ICD-10-CM | POA: Diagnosis not present

## 2018-09-06 ENCOUNTER — Emergency Department (HOSPITAL_COMMUNITY): Payer: Medicare HMO

## 2018-09-06 ENCOUNTER — Emergency Department (HOSPITAL_COMMUNITY)
Admission: EM | Admit: 2018-09-06 | Discharge: 2018-09-06 | Disposition: A | Discharge status: Home / Self Care | Payer: Medicare HMO | Attending: Emergency Medicine | Admitting: Emergency Medicine

## 2018-09-06 ENCOUNTER — Encounter (HOSPITAL_COMMUNITY): Payer: Self-pay | Admitting: *Deleted

## 2018-09-06 ENCOUNTER — Other Ambulatory Visit: Payer: Self-pay

## 2018-09-06 DIAGNOSIS — M25551 Pain in right hip: Secondary | ICD-10-CM

## 2018-09-06 DIAGNOSIS — Z8541 Personal history of malignant neoplasm of cervix uteri: Secondary | ICD-10-CM | POA: Insufficient documentation

## 2018-09-06 DIAGNOSIS — Z853 Personal history of malignant neoplasm of breast: Secondary | ICD-10-CM | POA: Insufficient documentation

## 2018-09-06 DIAGNOSIS — I959 Hypotension, unspecified: Secondary | ICD-10-CM | POA: Diagnosis not present

## 2018-09-06 DIAGNOSIS — Z79899 Other long term (current) drug therapy: Secondary | ICD-10-CM | POA: Diagnosis not present

## 2018-09-06 DIAGNOSIS — I10 Essential (primary) hypertension: Secondary | ICD-10-CM | POA: Diagnosis not present

## 2018-09-06 DIAGNOSIS — R52 Pain, unspecified: Secondary | ICD-10-CM | POA: Diagnosis not present

## 2018-09-06 DIAGNOSIS — M25572 Pain in left ankle and joints of left foot: Secondary | ICD-10-CM | POA: Diagnosis not present

## 2018-09-06 MED ORDER — DEXAMETHASONE SODIUM PHOSPHATE 10 MG/ML IJ SOLN
10.0000 mg | Freq: Once | INTRAMUSCULAR | Status: AC
  Administered 2018-09-06: 10 mg via INTRAMUSCULAR
  Filled 2018-09-06: qty 1

## 2018-09-06 MED ORDER — PREDNISONE 20 MG PO TABS: ORAL_TABLET | ORAL | 0 refills | Status: DC

## 2018-09-06 MED ORDER — METHOCARBAMOL 500 MG PO TABS
500.0000 mg | ORAL_TABLET | Freq: Once | ORAL | Status: AC
  Administered 2018-09-06: 500 mg via ORAL
  Filled 2018-09-06: qty 1

## 2018-09-06 MED ORDER — METHOCARBAMOL 500 MG PO TABS: ORAL_TABLET | ORAL | 0 refills | Status: DC

## 2018-09-06 NOTE — ED Triage Notes (Signed)
Pt c/o right hip pain for the last few days; pt states she went to the bathroom tonight and felt like she had a "catch" in her right hip; pt had a cortisone injection to the right hip x one week ago

## 2018-09-06 NOTE — Discharge Instructions (Addendum)
Use ice and heat for comfort. Take the medications as prescribed. Let Dr Willey Blade know if you aren't improving over the next several days or if it gets worse.

## 2018-09-06 NOTE — ED Provider Notes (Signed)
[facility] EMERGENCY DEPARTMENT Provider Note   CSN: [id number] Arrival date & time: 09/06/18  0418  Time seen 4:37 AM  History   Chief Complaint Chief Complaint  Patient presents with  . Hip Pain    HPI Kayla Thomas is a 78 y.o. female.     HPI patient states about a week ago she stood up off the toilet and had acute onset of pain in her right hip area.  She puts her hand in her right back over the proximal buttock area.  She states the next day it was better but she was seen by her PCP who diagnosed her with bursitis and gave her a shot in the office that helped.  However she is starting to notice if she moves the wrong way she gets severe pain.  The pain does not radiate into her leg.  She denies any numbness of her leg.  She denies any pain in her back but states her back "feels tired".  She denies any urinary or rectal incontinence.  She denies any injury prior to this happening.  PCP Asencion Noble, MD   Past Medical History:  Diagnosis Date  . Breast cancer ([place])   . Breast cancer ([place])   . Chronic back pain   . Gastric ulcer    History  . GERD (gastroesophageal reflux disease) 01/21/2006   EGD Dr Oneida Alar mild chronic gastritis, (NO h pylori) otherwise normal  . Headache(784.0)   . Sinus congestion   . Thumb tendonitis September 2014   left  . Uterine cancer ([place]) 1998  . Wears partial dentures     Patient Active Problem List   Diagnosis Date Noted  . Polyp of rectum   . Vulval lesion benign, excised 12/22/2016  . 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 Closed nondisplaced fracture of distal phalanx of right middle finger 02/27/2016  . Breast cancer ([place]) 04/30/2015  . Abnormal ECG   . DOE (dyspnea on exertion) 04/12/2014  . MIGRAINE HEADACHE 01/23/2009  . GERD 01/23/2009  . GASTRITIS 01/23/2009  . LOOSE STOOLS 01/23/2009  . ALLERGY 01/23/2009  . UTERINE CANCER, HX OF 01/23/2009    Past Surgical History:  Procedure Laterality Date  .  ABDOMINAL HYSTERECTOMY    . ABSCESS DRAINAGE  03-04-05   insect bite  . CARDIAC CATHETERIZATION N/A 10/31/2014   Procedure: Right/Left Heart Cath and Coronary Angiography;  Surgeon: Troy Sine, MD;  Location: [facility];  Service: Cardiovascular;  Laterality: N/A;  . CATARACT EXTRACTION W/PHACO  10/06/2011   Procedure: CATARACT EXTRACTION PHACO AND INTRAOCULAR LENS PLACEMENT (IOC);  Surgeon: Williams Che, MD;  Location: [facility];  Service: Ophthalmology;  Laterality: Left;  CDE:  5.76  . cataracts    . CHOLECYSTECTOMY  11/1999  . COLONOSCOPY  05/13/2011   Procedure: COLONOSCOPY;  Surgeon: Dorothyann Peng, MD;  [facility];  Service: Endoscopy;  Laterality: N/A;  8:30  . COLONOSCOPY N/A 04/09/2017   Dr. Oneida Alar: single tubular adenoma removed from the colon, redundant left colon, internal and external hemorrhoids, next colonoscopy in 10 to 15 years.  . ESOPHAGOGASTRODUODENOSCOPY  01/21/06   mild antral erythema bx h-pylori/normal esophagus without evidence of mass or Barrett's/normal pylorus and duodenum  . PARTIAL MASTECTOMY WITH NEEDLE LOCALIZATION AND AXILLARY SENTINEL LYMPH NODE BX Left 08/18/2012   Procedure: PARTIAL MASTECTOMY WITH NEEDLE LOCALIZATION AND AXILLARY SENTINEL LYMPH NODE BX;  Surgeon: Jamesetta So, MD;  Location: [facility];  Service:  General;  Laterality: Left;  Need Frozen Section/Sentinel Node Bx @ 8:00am/Needle Loc @ 9:00am  . POLYPECTOMY  04/09/2017   Procedure: POLYPECTOMY;  Surgeon: Danie Binder, MD;  [facility];  Service: Endoscopy;;  Rectal  . S/P Hysterectomy  1998   uterine ca  . temperal/mandibular  1974  . TONSILLECTOMY  1964  . TRIGGER FINGER RELEASE Right 05/04/2014   Procedure: RELEASE TRIGGER FINGER/A-1 PULLEY RIGHT RING FINGER;  Surgeon: Daryll Brod, MD;  Location: [facility];  Service: Orthopedics;  Laterality: Right;     OB History    Gravida  0   Para  0   Term  0   Preterm  0   AB  0   Living   0     SAB  0   TAB  0   Ectopic  0   Multiple  0   Live Births  0            Home Medications    Prior to Admission medications   Medication Sig Start Date End Date Taking? Authorizing Provider  acetaminophen (TYLENOL) 325 MG tablet Take 650 mg by mouth every 6 (six) hours as needed (for pain.).    [provider]  B Complex-C (SUPER B COMPLEX PO) Take 1 capsule by mouth daily.     [provider]  Calcium Carb-Cholecalciferol (CALCIUM 600/VITAMIN D3) 600-800 MG-UNIT TABS Take 1 capsule by mouth 2 (two) times daily.    [provider]  ciclopirox (PENLAC) 8 % solution Apply 1 application topically once a week. Apply over nail and surrounding skin. Apply daily over previous coat. After seven (7) days, may remove with alcohol and continue cycle.     [provider]  diclofenac sodium (VOLTAREN) 1 % GEL Apply 2-4 g topically 3 (three) times daily as needed (for pain.).  01/19/15   [provider]  diphenhydrAMINE (BENADRYL) 25 mg capsule Take 25 mg by mouth at bedtime.    [provider]  Glucosamine-Chondroitin (COSAMIN DS PO) Take 1 tablet by mouth daily.    [provider]  ipratropium (ATROVENT) 0.03 % nasal spray Place 2 sprays into the nose 2 (two) times daily as needed (for allergies.).     [provider]  loperamide (IMODIUM) 2 MG capsule Take 2 mg by mouth 4 (four) times daily as needed for diarrhea or loose stools.     [provider]  Lutein 40 MG CAPS Take 40 mg by mouth daily.    [provider]  methocarbamol (ROBAXIN) 500 MG tablet Take 1 or 2 po Q 6hrs for muscle pain 09/06/18   Rolland Porter, MD  Multiple Vitamin (MULTIVITAMIN WITH MINERALS) TABS tablet Take 1 tablet by mouth daily.    [provider]  omeprazole (PRILOSEC) 20 MG capsule Take 20 mg by mouth 2 (two) times daily before a meal.  04/22/11   Andria Meuse, NP  predniSONE (DELTASONE) 20 MG tablet Take 2 po QD  x 3 days then 1 po QD x 3d 09/06/18   Rolland Porter, MD  Turmeric 500 MG CAPS Take 500 mg by mouth daily.    [provider]    Family History Family History  Problem Relation Age of Onset  . Liver cancer Father   . Alcohol abuse Father   . Hypertension Mother   . COPD Mother   . Diabetes Brother   . Other Son  knee and back problems    Social History Social History   Tobacco Use  . Smoking status: Never Smoker  . Smokeless tobacco: Never Used  Substance Use Topics  . Alcohol use: No  . Drug use: No     Allergies   Aspirin; Iron; Sulfa antibiotics; Sulfasalazine; and Ancef [cefazolin]   Review of Systems Review of Systems  All other systems reviewed and are negative.    Physical Exam Updated Vital Signs BP 113/82 (BP Location: Left Arm)   Pulse 68   Temp 98.5 F (36.9 C) (Oral)   Resp 16   Ht 5\' 3"  (1.6 m)   Wt 84.8 kg   SpO2 99%   BMI 33.13 kg/m   Vital signs normal    Physical Exam Nursing note reviewed. Exam conducted with a chaperone present.  Constitutional:      General: She is in acute distress.     Appearance: Normal appearance.     Comments: Appears painful especially when she changes positions.  HENT:     Right Ear: External ear normal.     Left Ear: External ear normal.     Nose: Nose normal.  Eyes:     Extraocular Movements: Extraocular movements intact.     Conjunctiva/sclera: Conjunctivae normal.  Neck:     Musculoskeletal: Normal range of motion.  Cardiovascular:     Rate and Rhythm: Normal rate.  Pulmonary:     Effort: Pulmonary effort is normal. No respiratory distress.  Musculoskeletal:       Legs:     Comments: Patient's thoracic and lumbar spine are nontender to you get into the lower sacral area on the right.  She is not tender over the right SI joint or over the sciatic notch.  She has some pain a little bit more lateral and superior to the sciatic notch.  She is not tender over the true hip joint.  She  appears very painful when she attempts to change positions.  Skin:    General: Skin is warm and dry.     Findings: No erythema.  Neurological:     General: No focal deficit present.     Mental Status: She is alert and oriented to person, place, and time.     Cranial Nerves: No cranial nerve deficit.  Psychiatric:        Mood and Affect: Mood normal.        Behavior: Behavior normal.        Thought Content: Thought content normal.      ED Treatments / Results  Labs (all labs ordered are listed, but only abnormal results are displayed) Labs Reviewed - No data to display  EKG None  Radiology Dg Hip Unilat W Or Wo Pelvis 2-3 Views Right  Result Date: 09/06/2018 CLINICAL DATA:  Right hip pain for the past few days EXAM: DG HIP (WITH OR WITHOUT PELVIS) 2-3V RIGHT COMPARISON:  05/15/2015 FINDINGS: No evidence of fracture or dislocation. Postoperative lumbar spine and lower abdominal wall. Generalized osteopenia. Chronic mature periosteal reaction along the inferior and superior pubic rami, suspect sequela of prior radiotherapy in this patient with history of uterine cancer. No evident bone lesion. No degenerative hip narrowing or spurring. IMPRESSION: No acute finding or change from 2016. Electronically Signed   By: Monte Fantasia M.D.   On: 09/06/2018 05:36    Procedures Procedures (including critical care time)  Medications Ordered in ED Medications  methocarbamol (ROBAXIN) tablet 500 mg (500 mg Oral Given 09/06/18  0515)  dexamethasone (DECADRON) injection 10 mg (10 mg Intramuscular Given 09/06/18 0515)     Initial Impression / Assessment and Plan / ED Course  I have reviewed the triage vital signs and the nursing notes.  Pertinent labs & imaging results that were available during my care of the patient were reviewed by me and considered in my medical decision making (see chart for details).       January 15 her BUN was 18 and creatinine was 0.83.  I was going to give her  Toradol IM and Robaxin orally however she has a sulfasalazine allergy and Toradol cannot be given.  She was given Decadron IM and Robaxin orally.  X-ray was ordered.  X-ray was reviewed.  Her pain is very musculoskeletal in how she describes it and on her exam.  She will be sent home with some type of anti-inflammatory and muscle relaxer.  05:55 AM Recheck, her pain at rest is gone, still has some pain on movement.  She states she only takes Motrin when she has severe pain but it upsets her stomach.  Therefore I am going to put her  on steroids and have her eat when she takes them.   Final Clinical Impressions(s) / ED Diagnoses   Final diagnoses:  Right hip pain    ED Discharge Orders         Ordered    predniSONE (DELTASONE) 20 MG tablet     09/06/18 0613    methocarbamol (ROBAXIN) 500 MG tablet     09/06/18 7681         Plan discharge  Rolland Porter, MD, Barbette Or, MD 09/06/18 [phone]

## 2019-01-11 ENCOUNTER — Other Ambulatory Visit (HOSPITAL_COMMUNITY): Payer: Self-pay | Admitting: Hematology

## 2019-01-11 DIAGNOSIS — Z1231 Encounter for screening mammogram for malignant neoplasm of breast: Secondary | ICD-10-CM

## 2019-01-17 ENCOUNTER — Ambulatory Visit (HOSPITAL_COMMUNITY): Payer: Medicare HMO | Admitting: Hematology

## 2019-01-17 ENCOUNTER — Ambulatory Visit (HOSPITAL_COMMUNITY)
Admission: RE | Admit: 2019-01-17 | Discharge: 2019-01-17 | Disposition: A | Discharge status: Home / Self Care | Payer: Medicare HMO | Source: Ambulatory Visit | Attending: Hematology | Admitting: Hematology

## 2019-01-17 ENCOUNTER — Other Ambulatory Visit: Payer: Self-pay

## 2019-01-17 DIAGNOSIS — Z1231 Encounter for screening mammogram for malignant neoplasm of breast: Secondary | ICD-10-CM

## 2019-01-18 ENCOUNTER — Encounter (HOSPITAL_COMMUNITY): Payer: Medicare HMO

## 2019-01-20 ENCOUNTER — Other Ambulatory Visit: Payer: Self-pay

## 2019-01-20 ENCOUNTER — Inpatient Hospital Stay (HOSPITAL_COMMUNITY): Payer: Medicare HMO | Attending: Hematology | Admitting: Hematology

## 2019-01-20 VITALS — BP 151/50 | HR 70 | Temp 97.1°F | Resp 18 | Wt 185.0 lb

## 2019-01-20 DIAGNOSIS — Z853 Personal history of malignant neoplasm of breast: Secondary | ICD-10-CM | POA: Diagnosis not present

## 2019-01-20 DIAGNOSIS — Z808 Family history of malignant neoplasm of other organs or systems: Secondary | ICD-10-CM

## 2019-01-20 DIAGNOSIS — Z923 Personal history of irradiation: Secondary | ICD-10-CM | POA: Diagnosis not present

## 2019-01-20 DIAGNOSIS — C50419 Malignant neoplasm of upper-outer quadrant of unspecified female breast: Secondary | ICD-10-CM

## 2019-01-20 DIAGNOSIS — Z8542 Personal history of malignant neoplasm of other parts of uterus: Secondary | ICD-10-CM | POA: Insufficient documentation

## 2019-01-20 NOTE — Progress Notes (Signed)
Kayla Thomas, [place] [address]   CLINIC:  Medical Oncology/Hematology  PCP:  Kayla Thomas, [name] [address] [phone]   REASON FOR VISIT:  Follow-up for Breast Cancer  CURRENT THERAPY: Clinical surveillance  BRIEF ONCOLOGIC HISTORY:  Oncology History  Breast cancer Kayla Thomas)   Initial Diagnosis   Breast cancer ([place])   07/13/2017 Miscellaneous   BCI results revealed LOW risk of late recurrence (3%) and LOW likelihood of benefit from extended anti-estrogen therapy.        INTERVAL HISTORY:  [name] 78 y.o. female presents today for follow-up.  Reports overall doing well.  She denies any changes to her breasts.  No masses, lumps, nipple inversion or discharge.  She denies any changes to her health history since her last visit.  She recently had a mammogram and is here to discuss results.  REVIEW OF SYSTEMS:  Review of Systems  All other systems reviewed and are negative.    PAST MEDICAL/SURGICAL HISTORY:  Past Medical History:  Diagnosis Date  . Breast cancer ([facility])   . Breast cancer ([facility] [facility])   . Chronic back pain   . Gastric ulcer    History  . GERD (gastroesophageal reflux disease) 01/21/2006   EGD Dr Kayla Thomas mild chronic gastritis, (NO h pylori) otherwise normal  . Headache(784.0)   . Sinus congestion   . Thumb tendonitis September 2014   left  . Uterine cancer ([facility]) 1998  . Wears partial dentures    Past Surgical History:  Procedure Laterality Date  . ABDOMINAL HYSTERECTOMY    . ABSCESS DRAINAGE  03-04-05   insect bite  . CARDIAC CATHETERIZATION N/A 10/31/2014   Procedure: Right/Left Heart Cath and Coronary Angiography;  Surgeon: Kayla Sine, MD;  Location: [facility];  Service: Cardiovascular;  Laterality: N/A;  . CATARACT EXTRACTION W/PHACO  10/06/2011   Procedure: CATARACT EXTRACTION PHACO AND INTRAOCULAR LENS PLACEMENT (IOC);  Surgeon: Kayla Che, MD;  Location: [facility];   Service: Ophthalmology;  Laterality: Left;  CDE:  5.76  . cataracts    . CHOLECYSTECTOMY  11/1999  . COLONOSCOPY  05/13/2011   Procedure: COLONOSCOPY;  Surgeon: Kayla Peng, MD;  [facility];  Service: Endoscopy;  Laterality: N/A;  8:30  . COLONOSCOPY N/A 04/09/2017   Dr. Oneida Thomas: single tubular adenoma removed from the colon, redundant left colon, internal and external hemorrhoids, next colonoscopy in 10 to 15 years.  . ESOPHAGOGASTRODUODENOSCOPY  01/21/06   mild antral erythema bx h-pylori/normal esophagus without evidence of mass or Barrett's/normal pylorus and duodenum  . PARTIAL MASTECTOMY WITH NEEDLE LOCALIZATION AND AXILLARY SENTINEL LYMPH NODE BX Left 08/18/2012   Procedure: PARTIAL MASTECTOMY WITH NEEDLE LOCALIZATION AND AXILLARY SENTINEL LYMPH NODE BX;  Surgeon: Kayla So, MD;  Location: [facility];  Service: General;  Laterality: Left;  Need Frozen Section/Sentinel Node Bx @ 8:00am/Needle Loc @ 9:00am  . POLYPECTOMY  04/09/2017   Procedure: POLYPECTOMY;  Surgeon: Kayla Binder, MD;  [facility];  Service: Endoscopy;;  Rectal  . S/P Hysterectomy  1998   uterine ca  . temperal/mandibular  1974  . TONSILLECTOMY  1964  . TRIGGER FINGER RELEASE Right 05/04/2014   Procedure: RELEASE TRIGGER FINGER/A-1 PULLEY RIGHT RING FINGER;  Surgeon: Kayla Brod, MD;  Location[facility];  Service: Orthopedics;  Laterality: Right;     SOCIAL HISTORY:  Social History   Socioeconomic History  . Marital status:  Widowed    Spouse name: Not on file  . Number of children: 1  . Years of education: Not on file  . Highest education level: Not on file  Occupational History  . Occupation: retired; Facilities manager asst    Employer: RETIRED  Social Needs  . Financial resource strain: Not on file  . Food insecurity    Worry: Not on file    Inability: Not on file  . Transportation needs    Medical: Not on file    Non-medical: Not on file  Tobacco Use  . Smoking  status: Never Smoker  . Smokeless tobacco: Never Used  Substance and Sexual Activity  . Alcohol use: No  . Drug use: No  . Sexual activity: Not Currently    Birth control/protection: Surgical    Comment: hyst  Lifestyle  . Physical activity    Days per week: Not on file    Minutes per session: Not on file  . Stress: Not on file  Relationships  . Social Herbalist on phone: Not on file    Gets together: Not on file    Attends religious service: Not on file    Active member of club or organization: Not on file    Attends meetings of clubs or organizations: Not on file    Relationship status: Not on file  . Intimate partner violence    Fear of current or ex partner: Not on file    Emotionally abused: Not on file    Physically abused: Not on file    Forced sexual activity: Not on file  Other Topics Concern  . Not on file  Social History Narrative   1 adopted son-grown   Lives alone    FAMILY HISTORY:  Family History  Problem Relation Age of Onset  . Liver cancer Father   . Alcohol abuse Father   . Hypertension Mother   . COPD Mother   . Diabetes Brother   . Other Son        knee and back problems    CURRENT MEDICATIONS:  Outpatient Encounter Medications as of 01/20/2019  Medication Sig Note  . acetaminophen (TYLENOL) 325 MG tablet Take 650 mg by mouth every 6 (six) hours as needed (for pain.).   Marland Kitchen B Complex-C (SUPER B COMPLEX PO) Take 1 capsule by mouth daily.    . Calcium Carb-Cholecalciferol (CALCIUM 600/VITAMIN D3) 600-800 MG-UNIT TABS Take 1 capsule by mouth 2 (two) times daily.   . ciclopirox (PENLAC) 8 % solution Apply 1 application topically once a week. Apply over nail and surrounding skin. Apply daily over previous coat. After seven (7) days, may remove with alcohol and continue cycle.    . diphenhydrAMINE (BENADRYL) 25 mg capsule Take 25 mg by mouth at bedtime.   . Glucosamine-Chondroitin (COSAMIN DS PO) Take 1 tablet by mouth daily.   Marland Kitchen  ipratropium (ATROVENT) 0.03 % nasal spray Place 2 sprays into the nose 2 (two) times daily as needed (for allergies.).    Marland Kitchen loperamide (IMODIUM) 2 MG capsule Take 2 mg by mouth 4 (four) times daily as needed for diarrhea or loose stools.    . Lutein 40 MG CAPS Take 40 mg by mouth daily.   . methocarbamol (ROBAXIN) 500 MG tablet Take 1 or 2 po Q 6hrs for muscle pain   . Multiple Vitamin (MULTIVITAMIN WITH MINERALS) TABS tablet Take 1 tablet by mouth daily.   Marland Kitchen omeprazole (PRILOSEC) 20 MG capsule Take 20 mg  by mouth 2 (two) times daily before a meal.    . Turmeric 500 MG CAPS Take 500 mg by mouth daily.   . Turmeric POWD Take by mouth.   . Vitamins-Lipotropics (COMPLEX B-100) TBCR Take by mouth.   . diclofenac sodium (VOLTAREN) 1 % GEL Apply 2-4 g topically 3 (three) times daily as needed (for pain.).  04/02/2017: seldom  . predniSONE (DELTASONE) 20 MG tablet Take 2 po QD x 3 days then 1 po QD x 3d (Patient not taking: Reported on 01/20/2019)    No facility-administered encounter medications on file as of 01/20/2019.     ALLERGIES:  Allergies  Allergen Reactions  . Aspirin Other (See Comments)    REACTION: Abdominal pain   . Iron Anaphylaxis    REACTION: abdominal pain   . Sulfa Antibiotics Rash    Rash all over  . Sulfasalazine Rash    Rash all over  . Ancef [Cefazolin] Rash     PHYSICAL EXAM:  ECOG Performance status: 1  Vitals:   01/20/19 1111  BP: (!) 151/50  Pulse: 70  Resp: 18  Temp: (!) 97.1 F (36.2 C)  SpO2: 97%   Filed Weights   01/20/19 1111  Weight: 185 lb (83.9 kg)    Physical Exam Constitutional:      Appearance: Normal appearance. She is obese.  HENT:     Head: Normocephalic.     Nose: Nose normal.     Mouth/Throat:     Mouth: Mucous membranes are dry.     Pharynx: Oropharynx is clear.  Eyes:     Extraocular Movements: Extraocular movements intact.     Conjunctiva/sclera: Conjunctivae normal.  Neck:     Musculoskeletal: Normal range of motion.   Cardiovascular:     Rate and Rhythm: Normal rate and regular rhythm.     Pulses: Normal pulses.     Heart sounds: Normal heart sounds.  Pulmonary:     Effort: Pulmonary effort is normal.     Breath sounds: Normal breath sounds.  Abdominal:     General: Bowel sounds are normal.     Palpations: Abdomen is soft.  Musculoskeletal: Normal range of motion.  Skin:    General: Skin is warm and dry.  Neurological:     General: No focal deficit present.     Mental Status: She is alert and oriented to person, place, and time.  Psychiatric:        Mood and Affect: Mood normal.        Behavior: Behavior normal.        Thought Content: Thought content normal.        Judgment: Judgment normal.      LABORATORY DATA:  I have reviewed the labs as listed.  CBC    Component Value Date/Time   WBC 6.4 07/07/2017 0945   RBC 4.13 07/07/2017 0945   HGB 13.3 07/07/2017 0945   HCT 39.6 07/07/2017 0945   PLT 182 07/07/2017 0945   MCV 95.9 07/07/2017 0945   MCH 32.2 07/07/2017 0945   MCHC 33.6 07/07/2017 0945   RDW 12.2 07/07/2017 0945   LYMPHSABS 1.6 07/07/2017 0945   MONOABS 0.5 07/07/2017 0945   EOSABS 0.2 07/07/2017 0945   BASOSABS 0.0 07/07/2017 0945   CMP Latest Ref Rng & Units 07/07/2017 11/30/2014 10/31/2014  Glucose 65 - 99 mg/dL 135(H) 90 108(H)  BUN 6 - 20 mg/dL _0 Creatinine 0.44 - 1.00 mg/dL 0.83 0.75 0.87  Sodium 135 -  145 mmol/L 138 139 140  Potassium 3.5 - 5.1 mmol/L 4.2 4.3 4.2  Chloride 101 - 111 mmol/L 102 104 104  CO2 22 - 32 mmol/L _0 Calcium 8.9 - 10.3 mg/dL 8.7(L) 9.2 8.9  Total Protein 6.5 - 8.1 g/dL 6.4(L) 6.7 -  Total Bilirubin 0.3 - 1.2 mg/dL 0.5 0.5 -  Alkaline Phos 38 - 126 U/L 70 120 -  AST 15 - 41 U/L 19 22 -  ALT 14 - 54 U/L 20 29 -       ASSESSMENT & PLAN:   Breast cancer ([facility]) 1.  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   - Pt was diagnosed in 07/2012.  - Treated with (L) lumpectomy, followed by adjuvant  radiation therapy.   - Initially started anti-estrogen therapy with Arimidex in 12/2012.  - She was not able to tolerate Arimidex d/t severe fatigue, switched to Tamoxifen in 05/2015.   - Pt had BCI testing done on 07/13/2017 and showed a low likelihood of benefit with extended adjuvant therapy.   - She completed AI therapy in 12/2017. -Mammogram screening from July 2020 was negative for any evidence of disease with BI-RADS Category 1. -Plan to repeat bilateral screening mammogram in July 2021. -She will return to clinic in 1 year.  2.  Bone health -December 12, 2016 bone density screening revealed a T score of -1.0.  Patient has borderline osteopenia. -Recommended patient do low weightbearing exercises and continue calcium vitamin D supplementation.       Orders placed this encounter:  Orders Placed This Encounter  Procedures  . MM Digital Screening  . CBC with Differential  . Comprehensive metabolic panel  . Vitamin D 25 hydroxy      Roger Shelter, [place] [phone]

## 2019-01-20 NOTE — Assessment & Plan Note (Signed)
1.  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   - Pt was diagnosed in 07/2012.  - Treated with (L) lumpectomy, followed by adjuvant radiation therapy.   - Initially started anti-estrogen therapy with Arimidex in 12/2012.  - She was not able to tolerate Arimidex d/t severe fatigue, switched to Tamoxifen in 05/2015.   - Pt had BCI testing done on 07/13/2017 and showed a low likelihood of benefit with extended adjuvant therapy.   - She completed AI therapy in 12/2017. -Mammogram screening from July 2020 was negative for any evidence of disease with BI-RADS Category 1. -Plan to repeat bilateral screening mammogram in July 2021. -She will return to clinic in 1 year.  2.  Bone health -December 12, 2016 bone density screening revealed a T score of -1.0.  Patient has borderline osteopenia. -Recommended patient do low weightbearing exercises and continue calcium vitamin D supplementation.

## 2019-01-24 DIAGNOSIS — M1990 Unspecified osteoarthritis, unspecified site: Secondary | ICD-10-CM | POA: Diagnosis not present

## 2019-01-24 DIAGNOSIS — Z79899 Other long term (current) drug therapy: Secondary | ICD-10-CM | POA: Diagnosis not present

## 2019-01-24 DIAGNOSIS — C50919 Malignant neoplasm of unspecified site of unspecified female breast: Secondary | ICD-10-CM | POA: Diagnosis not present

## 2019-01-24 DIAGNOSIS — G43909 Migraine, unspecified, not intractable, without status migrainosus: Secondary | ICD-10-CM | POA: Diagnosis not present

## 2019-01-24 DIAGNOSIS — R7303 Prediabetes: Secondary | ICD-10-CM | POA: Diagnosis not present

## 2019-01-24 DIAGNOSIS — K219 Gastro-esophageal reflux disease without esophagitis: Secondary | ICD-10-CM | POA: Diagnosis not present

## 2019-01-31 DIAGNOSIS — Z853 Personal history of malignant neoplasm of breast: Secondary | ICD-10-CM | POA: Diagnosis not present

## 2019-01-31 DIAGNOSIS — K219 Gastro-esophageal reflux disease without esophagitis: Secondary | ICD-10-CM | POA: Diagnosis not present

## 2019-01-31 DIAGNOSIS — R7303 Prediabetes: Secondary | ICD-10-CM | POA: Diagnosis not present

## 2019-04-18 DIAGNOSIS — Z23 Encounter for immunization: Secondary | ICD-10-CM | POA: Diagnosis not present

## 2019-07-01 ENCOUNTER — Telehealth: Payer: Self-pay | Admitting: Obstetrics and Gynecology

## 2019-07-01 NOTE — Telephone Encounter (Signed)
Patient called in followup of question of whether paps could be stopped now 21 yrs after Ca Endometrium. Pt informed that Paps could be stopped based on conversations I've had with gyn onc specialist. Will follow up prn.

## 2019-10-19 ENCOUNTER — Ambulatory Visit (INDEPENDENT_AMBULATORY_CARE_PROVIDER_SITE_OTHER): Payer: Medicare HMO | Admitting: Ophthalmology

## 2019-10-19 ENCOUNTER — Encounter (INDEPENDENT_AMBULATORY_CARE_PROVIDER_SITE_OTHER): Payer: Medicare HMO | Admitting: Ophthalmology

## 2019-10-19 ENCOUNTER — Encounter (INDEPENDENT_AMBULATORY_CARE_PROVIDER_SITE_OTHER): Payer: Self-pay | Admitting: Ophthalmology

## 2019-10-19 ENCOUNTER — Other Ambulatory Visit: Payer: Self-pay

## 2019-10-19 DIAGNOSIS — H353112 Nonexudative age-related macular degeneration, right eye, intermediate dry stage: Secondary | ICD-10-CM | POA: Diagnosis not present

## 2019-10-19 DIAGNOSIS — H43821 Vitreomacular adhesion, right eye: Secondary | ICD-10-CM

## 2019-10-19 DIAGNOSIS — H353114 Nonexudative age-related macular degeneration, right eye, advanced atrophic with subfoveal involvement: Secondary | ICD-10-CM | POA: Insufficient documentation

## 2019-10-19 DIAGNOSIS — Z09 Encounter for follow-up examination after completed treatment for conditions other than malignant neoplasm: Secondary | ICD-10-CM | POA: Diagnosis not present

## 2019-10-19 HISTORY — DX: Vitreomacular adhesion, right eye: H43.821

## 2019-10-19 NOTE — Progress Notes (Signed)
10/19/2019     CHIEF COMPLAINT Patient presents for Post-op Follow-up   HISTORY OF PRESENT ILLNESS: Kayla Thomas is a 79 y.o. female who presents to the clinic today for:   HPI    Post-op Follow-up    In right eye.  Discomfort includes none.  Vision is blurred at near.  I, the attending physician,  performed the HPI with the patient and updated documentation appropriately.          Comments    6 Week Vitrectomy s\p OD.  For vitreal macular traction with subfoveal large PED like drusenoid deposit   OCT   Pt states NVA is blurry. Pt has new gls and states DVA is much better. Denies FOL and floaters.       Last edited by Hurman Horn, MD on 10/19/2019  9:53 AM. (History)      Referring physician: Asencion Noble, MD [address] [place],  [place] [address]  HISTORICAL INFORMATION:   Selected notes from the [facility]: No current outpatient medications on file. (Ophthalmic Drugs)   No current facility-administered medications for this visit. (Ophthalmic Drugs)   Current Outpatient Medications (Other)  Medication Sig  . acetaminophen (TYLENOL) 325 MG tablet Take 650 mg by mouth every 6 (six) hours as needed (for pain.).  Marland Kitchen B Complex-C (SUPER B COMPLEX PO) Take 1 capsule by mouth daily.   . Calcium Carb-Cholecalciferol (CALCIUM 600/VITAMIN D3) 600-800 MG-UNIT TABS Take 1 capsule by mouth 2 (two) times daily.  . ciclopirox (PENLAC) 8 % solution Apply 1 application topically once a week. Apply over nail and surrounding skin. Apply daily over previous coat. After seven (7) days, may remove with alcohol and continue cycle.   . diclofenac sodium (VOLTAREN) 1 % GEL Apply 2-4 g topically 3 (three) times daily as needed (for pain.).   Marland Kitchen diphenhydrAMINE (BENADRYL) 25 mg capsule Take 25 mg by mouth at bedtime.  . Glucosamine-Chondroitin (COSAMIN DS PO) Take 1 tablet by mouth daily.  Marland Kitchen ipratropium (ATROVENT) 0.03 % nasal spray Place 2  sprays into the nose 2 (two) times daily as needed (for allergies.).   Marland Kitchen loperamide (IMODIUM) 2 MG capsule Take 2 mg by mouth 4 (four) times daily as needed for diarrhea or loose stools.   . Lutein 40 MG CAPS Take 40 mg by mouth daily.  . methocarbamol (ROBAXIN) 500 MG tablet Take 1 or 2 po Q 6hrs for muscle pain  . Multiple Vitamin (MULTIVITAMIN WITH MINERALS) TABS tablet Take 1 tablet by mouth daily.  Marland Kitchen omeprazole (PRILOSEC) 20 MG capsule Take 20 mg by mouth 2 (two) times daily before a meal.   . predniSONE (DELTASONE) 20 MG tablet Take 2 po QD x 3 days then 1 po QD x 3d (Patient not taking: Reported on 01/20/2019)  . Turmeric 500 MG CAPS Take 500 mg by mouth daily.  . Turmeric POWD Take by mouth.  . Vitamins-Lipotropics (COMPLEX B-100) TBCR Take by mouth.   No current facility-administered medications for this visit. (Other)      REVIEW OF SYSTEMS:    ALLERGIES Allergies  Allergen Reactions  . Aspirin Other (See Comments)    REACTION: Abdominal pain   . Iron Anaphylaxis    REACTION: abdominal pain   . Sulfa Antibiotics Rash    Rash all over  . Sulfasalazine Rash    Rash all over  . Ancef [Cefazolin] Rash    PAST MEDICAL HISTORY  Past Medical History:  Diagnosis Date  . Breast cancer ([facility])   . Breast cancer ([facility])   . Chronic back pain   . Gastric ulcer    History  . GERD (gastroesophageal reflux disease) 01/21/2006   EGD Dr Oneida Alar mild chronic gastritis, (NO h pylori) otherwise normal  . Headache(784.0)   . Sinus congestion   . Thumb tendonitis September 2014   left  . Uterine cancer ([facility]) 1998  . Wears partial dentures    Past Surgical History:  Procedure Laterality Date  . ABDOMINAL HYSTERECTOMY    . ABSCESS DRAINAGE  03-04-05   insect bite  . CARDIAC CATHETERIZATION N/A 10/31/2014   Procedure: Right/Left Heart Cath and Coronary Angiography;  Surgeon: Troy Sine, MD;  Location: [facility];  Service: Cardiovascular;  Laterality: N/A;  . CATARACT  EXTRACTION W/PHACO  10/06/2011   Procedure: CATARACT EXTRACTION PHACO AND INTRAOCULAR LENS PLACEMENT (IOC);  Surgeon: Williams Che, MD;  Location[facility];  Service: Ophthalmology;  Laterality: Left;  CDE:  5.76  . cataracts    . CHOLECYSTECTOMY  11/1999  . COLONOSCOPY  05/13/2011   Procedure: COLONOSCOPY;  Surgeon: Dorothyann Peng, MD;  [facility];  Service: Endoscopy;  Laterality: N/A;  8:30  . COLONOSCOPY N/A 04/09/2017   Dr. Oneida Alar: single tubular adenoma removed from the colon, redundant left colon, internal and external hemorrhoids, next colonoscopy in 10 to 15 years.  . ESOPHAGOGASTRODUODENOSCOPY  01/21/06   mild antral erythema bx h-pylori/normal esophagus without evidence of mass or Barrett's/normal pylorus and duodenum  . PARTIAL MASTECTOMY WITH NEEDLE LOCALIZATION AND AXILLARY SENTINEL LYMPH NODE BX Left 08/18/2012   Procedure: PARTIAL MASTECTOMY WITH NEEDLE LOCALIZATION AND AXILLARY SENTINEL LYMPH NODE BX;  Surgeon: Jamesetta So, MD;  [facility];  Service: General;  Laterality: Left;  Need Frozen Section/Sentinel Node Bx @ 8:00am/Needle Loc @ 9:00am  . POLYPECTOMY  04/09/2017   Procedure: POLYPECTOMY;  Surgeon: Danie Binder, MD;  [facility];  Service: Endoscopy;;  Rectal  . S/P Hysterectomy  1998   uterine ca  . temperal/mandibular  1974  . TONSILLECTOMY  1964  . TRIGGER FINGER RELEASE Right 05/04/2014   Procedure: RELEASE TRIGGER FINGER/A-1 PULLEY RIGHT RING FINGER;  Surgeon: Daryll Brod, MD;  Location: [facility];  Service: Orthopedics;  Laterality: Right;    FAMILY HISTORY Family History  Problem Relation Age of Onset  . Liver cancer Father   . Alcohol abuse Father   . Hypertension Mother   . COPD Mother   . Diabetes Brother   . Other Son        knee and back problems    SOCIAL HISTORY Social History   Tobacco Use  . Smoking status: Never Smoker  . Smokeless tobacco: Never Used  Substance Use Topics  . Alcohol  use: No  . Drug use: No         OPHTHALMIC EXAM: Base Eye Exam    Visual Acuity (Snellen - Linear)      Right Left   Dist cc 20/50 20/30 -1   Dist ph cc NI        Tonometry (Tonopen, 8:44 AM)      Right Left   Pressure 13 13       Pupils      Pupils Dark Light Shape React APD   Right PERRL 4 3 Round Brisk None   Left PERRL 4 3 Round Brisk None  Visual Fields (Counting fingers)      Left Right    Full Full       Neuro/Psych    Oriented x3: Yes   Mood/Affect: Normal       Dilation    Right eye: 1.0% Mydriacyl, 2.5% Phenylephrine @ 8:45 AM        Slit Lamp and Fundus Exam    External Exam      Right Left   External Normal Normal       Slit Lamp Exam      Right Left   Lids/Lashes Normal Normal   Conjunctiva/Sclera White and quiet White and quiet   Cornea Clear Clear   Anterior Chamber Deep and quiet Deep and quiet   Iris Round and reactive Round and reactive   Lens Posterior chamber intraocular lens Posterior chamber intraocular lens   Anterior Vitreous Normal Normal       Fundus Exam      Right Left   Posterior Vitreous Vitrectomized    Disc Normal    C/D Ratio 0.0    Macula Yellowish pseudovitelliform type drusenoid PED in the fovea, no hemorrhage, no exudates    Vessels Normal    Periphery Normal           IMAGING AND PROCEDURES  Imaging and Procedures for 10/19/19           ASSESSMENT/PLAN:  No problem-specific Assessment & Plan notes found for this encounter.      ICD-10-CM   1. Follow-up examination after eye surgery  Z09 OCT, Retina - OU - Both Eyes  2. Intermediate stage nonexudative age-related macular degeneration of right eye  H35.3112 OCT, Retina - OU - Both Eyes  3. Vitreomacular adhesion of right eye  H43.821     1.  2.  3.  Ophthalmic Meds Ordered this visit:  No orders of the defined types were placed in this encounter.      No follow-ups on file.  There are no Patient Instructions on file for  this visit.   Explained the diagnoses, plan, and follow up with the patient and they expressed understanding.  Patient expressed understanding of the importance of proper follow up care.   Clent Demark Carolene Gitto M.D. Diseases & Surgery of the Retina and Vitreous [facility]ina [facility] Diabetic [place] 10/19/19     Abbreviations: M myopia (nearsighted); A astigmatism; H hyperopia (farsighted); P presbyopia; Mrx spectacle prescription;  CTL contact lenses; OD right eye; OS left eye; OU both eyes  XT exotropia; ET esotropia; PEK punctate epithelial keratitis; PEE punctate epithelial erosions; DES dry eye syndrome; MGD meibomian gland dysfunction; ATs artificial tears; PFAT's preservative free artificial tears; [place] nuclear sclerotic cataract; PSC posterior subcapsular cataract; ERM epi-retinal membrane; PVD posterior vitreous detachment; RD retinal detachment; DM diabetes mellitus; DR diabetic retinopathy; NPDR non-proliferative diabetic retinopathy; PDR proliferative diabetic retinopathy; CSME clinically significant macular edema; DME diabetic macular edema; dbh dot blot hemorrhages; CWS cotton wool spot; POAG primary open angle glaucoma; C/D cup-to-disc ratio; HVF humphrey visual field; GVF goldmann visual field; OCT optical coherence tomography; IOP intraocular pressure; BRVO Branch retinal vein occlusion; CRVO central retinal vein occlusion; CRAO central retinal artery occlusion; BRAO branch retinal artery occlusion; RT retinal tear; SB scleral buckle; PPV pars plana vitrectomy; VH Vitreous hemorrhage; PRP panretinal laser photocoagulation; IVK intravitreal kenalog; VMT vitreomacular traction; MH Macular hole;  NVD neovascularization of the disc; NVE neovascularization elsewhere; AREDS age related eye disease study; ARMD age related macular degeneration; POAG primary open angle  glaucoma; EBMD epithelial/anterior basement membrane dystrophy; ACIOL anterior chamber intraocular lens; IOL intraocular lens; PCIOL posterior  chamber intraocular lens; Phaco/IOL phacoemulsification with intraocular lens placement; [place] photorefractive keratectomy; LASIK laser assisted in situ keratomileusis; HTN hypertension; DM diabetes mellitus; COPD chronic obstructive pulmonary disease

## 2019-11-30 ENCOUNTER — Other Ambulatory Visit (HOSPITAL_COMMUNITY): Payer: Self-pay | Admitting: Nurse Practitioner

## 2019-11-30 DIAGNOSIS — C50419 Malignant neoplasm of upper-outer quadrant of unspecified female breast: Secondary | ICD-10-CM

## 2019-11-30 DIAGNOSIS — Z170 Estrogen receptor positive status [ER+]: Secondary | ICD-10-CM

## 2020-01-17 ENCOUNTER — Other Ambulatory Visit (HOSPITAL_COMMUNITY): Payer: Self-pay | Admitting: *Deleted

## 2020-01-17 DIAGNOSIS — C50419 Malignant neoplasm of upper-outer quadrant of unspecified female breast: Secondary | ICD-10-CM

## 2020-01-17 DIAGNOSIS — Z170 Estrogen receptor positive status [ER+]: Secondary | ICD-10-CM

## 2020-01-18 ENCOUNTER — Other Ambulatory Visit (HOSPITAL_COMMUNITY): Payer: Self-pay | Admitting: Nurse Practitioner

## 2020-01-18 ENCOUNTER — Other Ambulatory Visit: Payer: Self-pay

## 2020-01-18 ENCOUNTER — Inpatient Hospital Stay (HOSPITAL_COMMUNITY): Payer: Medicare HMO | Attending: Hematology

## 2020-01-18 ENCOUNTER — Ambulatory Visit (HOSPITAL_COMMUNITY)
Admission: RE | Admit: 2020-01-18 | Discharge: 2020-01-18 | Disposition: A | Discharge status: Home / Self Care | Payer: Medicare HMO | Source: Ambulatory Visit | Attending: Hematology | Admitting: Hematology

## 2020-01-18 DIAGNOSIS — C50419 Malignant neoplasm of upper-outer quadrant of unspecified female breast: Secondary | ICD-10-CM | POA: Insufficient documentation

## 2020-01-18 DIAGNOSIS — Z170 Estrogen receptor positive status [ER+]: Secondary | ICD-10-CM | POA: Diagnosis not present

## 2020-01-18 DIAGNOSIS — Z79810 Long term (current) use of selective estrogen receptor modulators (SERMs): Secondary | ICD-10-CM | POA: Insufficient documentation

## 2020-01-18 DIAGNOSIS — Z79899 Other long term (current) drug therapy: Secondary | ICD-10-CM | POA: Diagnosis not present

## 2020-01-18 DIAGNOSIS — M8580 Other specified disorders of bone density and structure, unspecified site: Secondary | ICD-10-CM | POA: Diagnosis not present

## 2020-01-18 DIAGNOSIS — C50912 Malignant neoplasm of unspecified site of left female breast: Secondary | ICD-10-CM | POA: Diagnosis not present

## 2020-01-18 DIAGNOSIS — Z1231 Encounter for screening mammogram for malignant neoplasm of breast: Secondary | ICD-10-CM

## 2020-01-18 LAB — COMPREHENSIVE METABOLIC PANEL
ALT: 17 U/L (ref 0–44)
AST: 18 U/L (ref 15–41)
Albumin: 4.1 g/dL (ref 3.5–5.0)
Alkaline Phosphatase: 89 U/L (ref 38–126)
Anion gap: 9 (ref 5–15)
BUN: 15 mg/dL (ref 8–23)
CO2: 26 mmol/L (ref 22–32)
Calcium: 9.1 mg/dL (ref 8.9–10.3)
Chloride: 102 mmol/L (ref 98–111)
Creatinine, Ser: 0.78 mg/dL (ref 0.44–1.00)
GFR calc Af Amer: >60 mL/min (ref >60)
GFR calc non Af Amer: >60 mL/min (ref >60)
Glucose, Bld: 139 mg/dL — ABNORMAL HIGH (ref 70–99)
Potassium: 4.4 mmol/L (ref 3.5–5.1)
Sodium: 137 mmol/L (ref 135–145)
Total Bilirubin: 0.7 mg/dL (ref 0.3–1.2)
Total Protein: 6.6 g/dL (ref 6.5–8.1)

## 2020-01-18 LAB — CBC WITH DIFFERENTIAL/PLATELET
Abs Immature Granulocytes: 0.03 10*3/uL (ref 0.00–0.07)
Basophils Absolute: 0 10*3/uL (ref 0.0–0.1)
Basophils Relative: 1 %
Eosinophils Absolute: 0.2 10*3/uL (ref 0.0–0.5)
Eosinophils Relative: 2 %
HCT: 42.1 % (ref 36.0–46.0)
Hemoglobin: 14 g/dL (ref 12.0–15.0)
Immature Granulocytes: 0 %
Lymphocytes Relative: 19 %
Lymphs Abs: 1.3 10*3/uL (ref 0.7–4.0)
MCH: 32.3 pg (ref 26.0–34.0)
MCHC: 33.3 g/dL (ref 30.0–36.0)
MCV: 97.2 fL (ref 80.0–100.0)
Monocytes Absolute: 0.5 10*3/uL (ref 0.1–1.0)
Monocytes Relative: 7 %
Neutro Abs: 4.9 10*3/uL (ref 1.7–7.7)
Neutrophils Relative %: 71 %
Platelets: 219 10*3/uL (ref 150–400)
RBC: 4.33 MIL/uL (ref 3.87–5.11)
RDW: 12.4 % (ref 11.5–15.5)
WBC: 6.9 10*3/uL (ref 4.0–10.5)
nRBC: 0 % (ref 0.0–0.2)

## 2020-01-19 ENCOUNTER — Ambulatory Visit (INDEPENDENT_AMBULATORY_CARE_PROVIDER_SITE_OTHER): Payer: Medicare HMO | Admitting: Ophthalmology

## 2020-01-19 ENCOUNTER — Encounter (INDEPENDENT_AMBULATORY_CARE_PROVIDER_SITE_OTHER): Payer: Self-pay | Admitting: Ophthalmology

## 2020-01-19 DIAGNOSIS — H43821 Vitreomacular adhesion, right eye: Secondary | ICD-10-CM | POA: Diagnosis not present

## 2020-01-19 DIAGNOSIS — H353112 Nonexudative age-related macular degeneration, right eye, intermediate dry stage: Secondary | ICD-10-CM | POA: Diagnosis not present

## 2020-01-19 LAB — CANCER ANTIGEN 27.29: CA 27.29: 12 U/mL (ref 0.0–38.6)

## 2020-01-19 NOTE — Assessment & Plan Note (Signed)
This condition is resolved, status post vitrectomy

## 2020-01-19 NOTE — Progress Notes (Signed)
01/19/2020     CHIEF COMPLAINT Patient presents for Retina Follow Up   HISTORY OF PRESENT ILLNESS: Kayla Thomas is a 79 y.o. female who presents to the clinic today for:   HPI    Retina Follow Up    Patient presents with  Other.  In both eyes.  This started 3 months ago.  Severity is mild.  Duration of 3 months.  Since onset it is stable.          Comments    3 Month F/U OU  Pt denies noticeable changes to [place] OU since last visit. Pt denies ocular pain, flashes of light, or floaters OU.         Last edited by Rockie Neighbours, [place] on 01/19/2020  8:24 AM. (History)      Referring physician: Asencion Noble, MD [address] [place],  [place] [address]  HISTORICAL INFORMATION:   Selected notes from the [facility]: No current outpatient medications on file. (Ophthalmic Drugs)   No current facility-administered medications for this visit. (Ophthalmic Drugs)   Current Outpatient Medications (Other)  Medication Sig  . acetaminophen (TYLENOL) 325 MG tablet Take 650 mg by mouth every 6 (six) hours as needed (for pain.).  Marland Kitchen B Complex-C (SUPER B COMPLEX PO) Take 1 capsule by mouth daily.   . Calcium Carb-Cholecalciferol (CALCIUM 600/VITAMIN D3) 600-800 MG-UNIT TABS Take 1 capsule by mouth 2 (two) times daily.  . ciclopirox (PENLAC) 8 % solution Apply 1 application topically once a week. Apply over nail and surrounding skin. Apply daily over previous coat. After seven (7) days, may remove with alcohol and continue cycle.   . diclofenac sodium (VOLTAREN) 1 % GEL Apply 2-4 g topically 3 (three) times daily as needed (for pain.).   Marland Kitchen diphenhydrAMINE (BENADRYL) 25 mg capsule Take 25 mg by mouth at bedtime.  . Glucosamine-Chondroitin (COSAMIN DS PO) Take 1 tablet by mouth daily.  Marland Kitchen ipratropium (ATROVENT) 0.03 % nasal spray Place 2 sprays into the nose 2 (two) times daily as needed (for allergies.).   Marland Kitchen loperamide (IMODIUM) 2 MG capsule  Take 2 mg by mouth 4 (four) times daily as needed for diarrhea or loose stools.   . Lutein 40 MG CAPS Take 40 mg by mouth daily.  . methocarbamol (ROBAXIN) 500 MG tablet Take 1 or 2 po Q 6hrs for muscle pain  . Multiple Vitamin (MULTIVITAMIN WITH MINERALS) TABS tablet Take 1 tablet by mouth daily.  Marland Kitchen omeprazole (PRILOSEC) 20 MG capsule Take 20 mg by mouth 2 (two) times daily before a meal.   . predniSONE (DELTASONE) 20 MG tablet Take 2 po QD x 3 days then 1 po QD x 3d (Patient not taking: Reported on 01/20/2019)  . Turmeric 500 MG CAPS Take 500 mg by mouth daily.  . Turmeric POWD Take by mouth.  . Vitamins-Lipotropics (COMPLEX B-100) TBCR Take by mouth.   No current facility-administered medications for this visit. (Other)      REVIEW OF SYSTEMS:    ALLERGIES Allergies  Allergen Reactions  . Aspirin Other (See Comments)    REACTION: Abdominal pain   . Iron Anaphylaxis    REACTION: abdominal pain   . Sulfa Antibiotics Rash    Rash all over  . Sulfasalazine Rash    Rash all over  . Ancef [Cefazolin] Rash    PAST MEDICAL HISTORY Past Medical History:  Diagnosis Date  . Breast cancer ([place])   .  Breast cancer ([place])   . Chronic back pain   . Gastric ulcer    History  . GERD (gastroesophageal reflux disease) 01/21/2006   EGD Dr Oneida Alar mild chronic gastritis, (NO h pylori) otherwise normal  . Headache(784.0)   . Sinus congestion   . Thumb tendonitis September 2014   left  . Uterine cancer ([place]) 1998  . Wears partial dentures    Past Surgical History:  Procedure Laterality Date  . ABDOMINAL HYSTERECTOMY    . ABSCESS DRAINAGE  03-04-05   insect bite  . CARDIAC CATHETERIZATION N/A 10/31/2014   Procedure: Right/Left Heart Cath and Coronary Angiography;  Surgeon: Troy Sine, MD;  Location: [facility];  Service: Cardiovascular;  Laterality: N/A;  . CATARACT EXTRACTION W/PHACO  10/06/2011   Procedure: CATARACT EXTRACTION PHACO AND INTRAOCULAR LENS PLACEMENT (IOC);   Surgeon: Williams Che, MD;  Location: [facility];  Service: Ophthalmology;  Laterality: Left;  CDE:  5.76  . cataracts    . CHOLECYSTECTOMY  11/1999  . COLONOSCOPY  05/13/2011   Procedure: COLONOSCOPY;  Surgeon: Dorothyann Peng, MD;  [facility];  Service: Endoscopy;  Laterality: N/A;  8:30  . COLONOSCOPY N/A 04/09/2017   Dr. Oneida Alar: single tubular adenoma removed from the colon, redundant left colon, internal and external hemorrhoids, next colonoscopy in 10 to 15 years.  . ESOPHAGOGASTRODUODENOSCOPY  01/21/06   mild antral erythema bx h-pylori/normal esophagus without evidence of mass or Barrett's/normal pylorus and duodenum  . PARTIAL MASTECTOMY WITH NEEDLE LOCALIZATION AND AXILLARY SENTINEL LYMPH NODE BX Left 08/18/2012   Procedure: PARTIAL MASTECTOMY WITH NEEDLE LOCALIZATION AND AXILLARY SENTINEL LYMPH NODE BX;  Surgeon: Jamesetta So, MD;  Location: [facility];  Service: General;  Laterality: Left;  Need Frozen Section/Sentinel Node Bx @ 8:00am/Needle Loc @ 9:00am  . POLYPECTOMY  04/09/2017   Procedure: POLYPECTOMY;  Surgeon: Danie Binder, MD;  [facility];  Service: Endoscopy;;  Rectal  . S/P Hysterectomy  1998   uterine ca  . temperal/mandibular  1974  . TONSILLECTOMY  1964  . TRIGGER FINGER RELEASE Right 05/04/2014   Procedure: RELEASE TRIGGER FINGER/A-1 PULLEY RIGHT RING FINGER;  Surgeon: Daryll Brod, MD;  Location: [facility];  Service: Orthopedics;  Laterality: Right;    FAMILY HISTORY Family History  Problem Relation Age of Onset  . Liver cancer Father   . Alcohol abuse Father   . Hypertension Mother   . COPD Mother   . Diabetes Brother   . Other Son        knee and back problems    SOCIAL HISTORY Social History   Tobacco Use  . Smoking status: Never Smoker  . Smokeless tobacco: Never Used  Substance Use Topics  . Alcohol use: No  . Drug use: No         OPHTHALMIC EXAM:  Base Eye Exam    Visual Acuity (ETDRS)      Right  Left   Dist cc 20/60 -2 20/25 +1   Dist ph cc NI    Correction: Glasses       Tonometry (Tonopen, 8:29 AM)      Right Left   Pressure 14 15       Pupils      Pupils Dark Light Shape React APD   Right PERRL 4 3 Round Brisk None   Left PERRL 4 3 Round Brisk None       Visual Fields (Counting fingers)  Left Right    Full Full       Extraocular Movement      Right Left    Full Full       Neuro/Psych    Oriented x3: Yes   Mood/Affect: Normal       Dilation    Both eyes: 1.0% Mydriacyl, 2.5% Phenylephrine @ 8:29 AM        Slit Lamp and Fundus Exam    External Exam      Right Left   External Normal Normal       Slit Lamp Exam      Right Left   Lids/Lashes Normal Normal   Conjunctiva/Sclera White and quiet White and quiet   Cornea Clear Clear   Anterior Chamber Deep and quiet Deep and quiet   Iris Round and reactive Round and reactive   Lens Posterior chamber intraocular lens Posterior chamber intraocular lens   Anterior Vitreous Normal Normal       Fundus Exam      Right Left   Posterior Vitreous Vitrectomized    Disc Normal    C/D Ratio 0.0    Macula Yellowish pseudovitelliform type drusenoid PED in the fovea, no hemorrhage, no exudates, no change    Vessels Normal    Periphery Normal           IMAGING AND PROCEDURES  Imaging and Procedures for 01/19/20  OCT, Retina - OU - Both Eyes       Right Eye Quality was good. Scan locations included subfoveal. Central Foveal Thickness: 465. Findings include no IRF, no SRF, retinal drusen .   Left Eye Quality was good. Scan locations included subfoveal. Central Foveal Thickness: 244. Progression has been stable. Findings include retinal drusen , no IRF.   Notes Large subfoveal drusenoid deposit, no morphologic change thus far some 2-1/2 to 3 months status post vitrectomy for vitreal macular adhesion.  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 Vitreomacular adhesion of right eye  H43.821     1.  We will continue to observe the monitor.  Other conditions that can exacerbate this type of finding the right eye can be sleep apnea the attended hypoxic RPE not working as well, but she does not have a positive review of systems for sleep apnea  2.  Observe and continue AREDS 2 vitamins  3.  Ophthalmic Meds Ordered this visit:  No orders of the defined types were placed in this encounter.      Return in about 3 months (around 04/20/2020) for DILATE OU, OCT.  There are no Patient Instructions on file for this visit.   Explained the diagnoses, plan, and follow up with the patient and they expressed understanding.  Patient expressed understanding of the importance of proper follow up care.   Clent Demark Jamileth Putzier M.D. Diseases & Surgery of the Retina and Vitreous Retina & Diabetic [place] 01/19/20     Abbreviations: M myopia (nearsighted); A  astigmatism; H hyperopia (farsighted); P presbyopia; Mrx spectacle prescription;  CTL contact lenses; OD right eye; OS left eye; OU both eyes  XT exotropia; ET esotropia; PEK punctate epithelial keratitis; PEE punctate epithelial erosions; DES dry eye syndrome; MGD meibomian gland dysfunction; ATs artificial tears; PFAT's preservative free artificial tears; [place] nuclear sclerotic cataract; PSC posterior subcapsular cataract; ERM epi-retinal membrane; PVD posterior vitreous detachment; RD retinal detachment; DM diabetes mellitus; DR diabetic  retinopathy; NPDR non-proliferative diabetic retinopathy; PDR proliferative diabetic retinopathy; CSME clinically significant macular edema; DME diabetic macular edema; dbh dot blot hemorrhages; CWS cotton wool spot; POAG primary open angle glaucoma; C/D cup-to-disc ratio; HVF humphrey visual field; GVF goldmann visual field; OCT optical coherence tomography; IOP intraocular pressure; BRVO Branch retinal vein occlusion; CRVO central retinal vein occlusion; CRAO central retinal artery occlusion; BRAO branch retinal artery occlusion; RT retinal tear; SB scleral buckle; PPV pars plana vitrectomy; VH Vitreous hemorrhage; PRP panretinal laser photocoagulation; IVK intravitreal kenalog; VMT vitreomacular traction; MH Macular hole;  NVD neovascularization of the disc; NVE neovascularization elsewhere; AREDS age related eye disease study; ARMD age related macular degeneration; POAG primary open angle glaucoma; EBMD epithelial/anterior basement membrane dystrophy; ACIOL anterior chamber intraocular lens; IOL intraocular lens; PCIOL posterior chamber intraocular lens; Phaco/IOL phacoemulsification with intraocular lens placement; [place] photorefractive keratectomy; LASIK laser assisted in situ keratomileusis; HTN hypertension; DM diabetes mellitus; COPD chronic obstructive pulmonary disease

## 2020-01-19 NOTE — Assessment & Plan Note (Signed)
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

## 2020-01-26 ENCOUNTER — Ambulatory Visit (HOSPITAL_COMMUNITY): Payer: Medicare HMO | Admitting: Nurse Practitioner

## 2020-02-16 ENCOUNTER — Other Ambulatory Visit: Payer: Self-pay

## 2020-02-16 ENCOUNTER — Ambulatory Visit (HOSPITAL_COMMUNITY)
Admission: RE | Admit: 2020-02-16 | Discharge: 2020-02-16 | Disposition: A | Discharge status: Home / Self Care | Payer: Medicare HMO | Source: Ambulatory Visit | Attending: Nurse Practitioner | Admitting: Nurse Practitioner

## 2020-02-16 ENCOUNTER — Inpatient Hospital Stay (HOSPITAL_COMMUNITY): Admission: RE | Admit: 2020-02-16 | Payer: Medicare HMO | Source: Ambulatory Visit

## 2020-02-16 DIAGNOSIS — Z1231 Encounter for screening mammogram for malignant neoplasm of breast: Secondary | ICD-10-CM | POA: Diagnosis present

## 2020-02-20 ENCOUNTER — Encounter (INDEPENDENT_AMBULATORY_CARE_PROVIDER_SITE_OTHER): Payer: Medicare HMO | Admitting: Ophthalmology

## 2020-02-21 ENCOUNTER — Other Ambulatory Visit: Payer: Self-pay

## 2020-02-21 ENCOUNTER — Inpatient Hospital Stay (HOSPITAL_COMMUNITY): Payer: Medicare HMO | Attending: Hematology | Admitting: Nurse Practitioner

## 2020-02-21 VITALS — BP 142/71 | HR 71 | Temp 98.2°F | Resp 16 | Wt 181.9 lb

## 2020-02-21 DIAGNOSIS — Z853 Personal history of malignant neoplasm of breast: Secondary | ICD-10-CM | POA: Diagnosis not present

## 2020-02-21 DIAGNOSIS — Z8542 Personal history of malignant neoplasm of other parts of uterus: Secondary | ICD-10-CM | POA: Insufficient documentation

## 2020-02-21 DIAGNOSIS — Z1231 Encounter for screening mammogram for malignant neoplasm of breast: Secondary | ICD-10-CM

## 2020-02-21 DIAGNOSIS — C50419 Malignant neoplasm of upper-outer quadrant of unspecified female breast: Secondary | ICD-10-CM

## 2020-02-21 DIAGNOSIS — Z170 Estrogen receptor positive status [ER+]: Secondary | ICD-10-CM | POA: Diagnosis not present

## 2020-02-21 DIAGNOSIS — Z90710 Acquired absence of both cervix and uterus: Secondary | ICD-10-CM | POA: Insufficient documentation

## 2020-02-21 NOTE — Progress Notes (Signed)
[facility] [place], [place] [address]   CLINIC:  Medical Oncology/Hematology  PCP:  Asencion Noble, [name] [address] [phone]   REASON FOR VISIT: Follow-up for breast cancer   CURRENT THERAPY: Surveillance  BRIEF ONCOLOGIC HISTORY:  Oncology History  Breast cancer [facility])   Initial Diagnosis   Breast cancer ([place])   07/13/2017 Miscellaneous   BCI results revealed LOW risk of late recurrence (3%) and LOW likelihood of benefit from extended anti-estrogen therapy.       INTERVAL HISTORY:  Kayla Thomas 79 y.o. female returns for routine follow-up for breast cancer.  Patient reports she is doing well since her last visit.  She reports she just had a checkup by her PCP as well.  She denies any issues at this time. Denies any nausea, vomiting, or diarrhea. Denies any new pains. Had not noticed any recent bleeding such as epistaxis, hematuria or hematochezia. Denies recent chest pain on exertion, shortness of breath on minimal exertion, pre-syncopal episodes, or palpitations. Denies any numbness or tingling in hands or feet. Denies any recent fevers, infections, or recent hospitalizations. Patient reports appetite at 100% and energy level at 100%.  She is eating well maintain her weight this time.    REVIEW OF SYSTEMS:  Review of Systems  All other systems reviewed and are negative.    PAST MEDICAL/SURGICAL HISTORY:  Past Medical History:  Diagnosis Date  . Breast cancer ([place])   . Breast cancer ([place])   . Chronic back pain   . Gastric ulcer    History  . GERD (gastroesophageal reflux disease) 01/21/2006   EGD Dr Oneida Alar mild chronic gastritis, (NO h pylori) otherwise normal  . Headache(784.0)   . Sinus congestion   . Thumb tendonitis September 2014   left  . Uterine cancer ([place]) 1998  . Wears partial dentures    Past Surgical History:  Procedure Laterality Date  . ABDOMINAL HYSTERECTOMY    . ABSCESS DRAINAGE   03-04-05   insect bite  . CARDIAC CATHETERIZATION N/A 10/31/2014   Procedure: Right/Left Heart Cath and Coronary Angiography;  Surgeon: Troy Sine, MD;  Location: [facility];  Service: Cardiovascular;  Laterality: N/A;  . CATARACT EXTRACTION W/PHACO  10/06/2011   Procedure: CATARACT EXTRACTION PHACO AND INTRAOCULAR LENS PLACEMENT (IOC);  Surgeon: Williams Che, MD;  Location: [facility];  Service: Ophthalmology;  Laterality: Left;  CDE:  5.76  . cataracts    . CHOLECYSTECTOMY  11/1999  . COLONOSCOPY  05/13/2011   Procedure: COLONOSCOPY;  Surgeon: Dorothyann Peng, MD;  [facility];  Service: Endoscopy;  Laterality: N/A;  8:30  . COLONOSCOPY N/A 04/09/2017   Dr. Oneida Alar: single tubular adenoma removed from the colon, redundant left colon, internal and external hemorrhoids, next colonoscopy in 10 to 15 years.  . ESOPHAGOGASTRODUODENOSCOPY  01/21/06   mild antral erythema bx h-pylori/normal esophagus without evidence of mass or Barrett's/normal pylorus and duodenum  . PARTIAL MASTECTOMY WITH NEEDLE LOCALIZATION AND AXILLARY SENTINEL LYMPH NODE BX Left 08/18/2012   Procedure: PARTIAL MASTECTOMY WITH NEEDLE LOCALIZATION AND AXILLARY SENTINEL LYMPH NODE BX;  Surgeon: Jamesetta So, MD;  Location: [facility];  Service: General;  Laterality: Left;  Need Frozen Section/Sentinel Node Bx @ 8:00am/Needle Loc @ 9:00am  . POLYPECTOMY  04/09/2017   Procedure: POLYPECTOMY;  Surgeon: Danie Binder, MD;  [facility];  Service: Endoscopy;;  Rectal  . S/P Hysterectomy  1998  uterine ca  . temperal/mandibular  1974  . TONSILLECTOMY  1964  . TRIGGER FINGER RELEASE Right 05/04/2014   Procedure: RELEASE TRIGGER FINGER/A-1 PULLEY RIGHT RING FINGER;  Surgeon: Daryll Brod, MD;  Location: [facility];  Service: Orthopedics;  Laterality: Right;     SOCIAL HISTORY:  Social History   Socioeconomic History  . Marital status: Widowed    Spouse name: Not on file  . Number of  children: 1  . Years of education: Not on file  . Highest education level: Not on file  Occupational History  . Occupation: retired; Facilities manager asst    Employer: RETIRED  Tobacco Use  . Smoking status: Never Smoker  . Smokeless tobacco: Never Used  Substance and Sexual Activity  . Alcohol use: No  . Drug use: No  . Sexual activity: Not Currently    Birth control/protection: Surgical    Comment: hyst  Other Topics Concern  . Not on file  Social History Narrative   1 adopted son-grown   Lives alone   Social Determinants of Health   Financial Resource Strain:   . Difficulty of Paying Living Expenses: Not on file  Food Insecurity:   . Worried About Charity fundraiser in the Last Year: Not on file  . Ran Out of Food in the Last Year: Not on file  Transportation Needs:   . Lack of Transportation (Medical): Not on file  . Lack of Transportation (Non-Medical): Not on file  Physical Activity:   . Days of Exercise per Week: Not on file  . Minutes of Exercise per Session: Not on file  Stress:   . Feeling of Stress : Not on file  Social Connections:   . Frequency of Communication with Friends and Family: Not on file  . Frequency of Social Gatherings with Friends and Family: Not on file  . Attends Religious Services: Not on file  . Active Member of Clubs or Organizations: Not on file  . Attends Archivist Meetings: Not on file  . Marital Status: Not on file  Intimate Partner Violence:   . Fear of Current or Ex-Partner: Not on file  . Emotionally Abused: Not on file  . Physically Abused: Not on file  . Sexually Abused: Not on file    FAMILY HISTORY:  Family History  Problem Relation Age of Onset  . Liver cancer Father   . Alcohol abuse Father   . Hypertension Mother   . COPD Mother   . Diabetes Brother   . Other Son        knee and back problems    CURRENT MEDICATIONS:  Outpatient Encounter Medications as of 02/21/2020  Medication Sig Note  . acetaminophen  (TYLENOL) 325 MG tablet Take 650 mg by mouth every 6 (six) hours as needed (for pain.).   Marland Kitchen B Complex-C (SUPER B COMPLEX PO) Take 1 capsule by mouth daily.    . Calcium Carb-Cholecalciferol (CALCIUM 600/VITAMIN D3) 600-800 MG-UNIT TABS Take 1 capsule by mouth 2 (two) times daily.   . diclofenac sodium (VOLTAREN) 1 % GEL Apply 2-4 g topically 3 (three) times daily as needed (for pain.).  04/02/2017: seldom  . diphenhydrAMINE (BENADRYL) 25 mg capsule Take 25 mg by mouth at bedtime.   . Glucosamine-Chondroitin (COSAMIN DS PO) Take 1 tablet by mouth daily.   Marland Kitchen loperamide (IMODIUM) 2 MG capsule Take 2 mg by mouth 4 (four) times daily as needed for diarrhea or loose stools.    . Lutein  40 MG CAPS Take 40 mg by mouth daily.   . Multiple Vitamin (MULTIVITAMIN WITH MINERALS) TABS tablet Take 1 tablet by mouth daily.   . Multiple Vitamins-Minerals (PRESERVISION AREDS PO) Take by mouth.   Marland Kitchen omeprazole (PRILOSEC) 20 MG capsule Take 20 mg by mouth 2 (two) times daily before a meal.    . Turmeric 500 MG CAPS Take 500 mg by mouth daily.   . Vitamins-Lipotropics (COMPLEX B-100) TBCR Take by mouth.   . Zinc Sulfate (ZINC 15 PO) Take by mouth.   . [DISCONTINUED] ciclopirox (PENLAC) 8 % solution Apply 1 application topically once a week. Apply over nail and surrounding skin. Apply daily over previous coat. After seven (7) days, may remove with alcohol and continue cycle.    . [DISCONTINUED] ipratropium (ATROVENT) 0.03 % nasal spray Place 2 sprays into the nose 2 (two) times daily as needed (for allergies.).    . [DISCONTINUED] methocarbamol (ROBAXIN) 500 MG tablet Take 1 or 2 po Q 6hrs for muscle pain   . [DISCONTINUED] Multiple Vitamins-Minerals (MULTIVITAMINS THER. W/MINERALS) TABS tablet Take by mouth.   . [DISCONTINUED] predniSONE (DELTASONE) 20 MG tablet Take 2 po QD x 3 days then 1 po QD x 3d (Patient not taking: Reported on 01/20/2019)   . [DISCONTINUED] Turmeric POWD Take by mouth.    No  facility-administered encounter medications on file as of 02/21/2020.    ALLERGIES:  Allergies  Allergen Reactions  . Aspirin Other (See Comments)    REACTION: Abdominal pain   . Iron Anaphylaxis    REACTION: abdominal pain   . Sulfa Antibiotics Rash    Rash all over  . Sulfasalazine Rash    Rash all over  . Ancef [Cefazolin] Rash     PHYSICAL EXAM:  ECOG Performance status: 1  Vitals:   02/21/20 0946  BP: (!) 142/71  Pulse: 71  Resp: 16  Temp: 98.2 F (36.8 C)  SpO2: 97%   Filed Weights   02/21/20 0946  Weight: 181 lb 14.1 oz (82.5 kg)   Physical Exam Constitutional:      Appearance: Normal appearance. She is normal weight.  Cardiovascular:     Rate and Rhythm: Normal rate and regular rhythm.     Heart sounds: Normal heart sounds.  Pulmonary:     Effort: Pulmonary effort is normal.     Breath sounds: Normal breath sounds.  Abdominal:     General: Bowel sounds are normal.     Palpations: Abdomen is soft.  Musculoskeletal:        General: Normal range of motion.  Skin:    General: Skin is warm.  Neurological:     Mental Status: She is alert and oriented to person, place, and time. Mental status is at baseline.  Psychiatric:        Mood and Affect: Mood normal.        Behavior: Behavior normal.        Thought Content: Thought content normal.        Judgment: Judgment normal.      LABORATORY DATA:  I have reviewed the labs as listed.  CBC    Component Value Date/Time   WBC 6.9 01/18/2020 1100   RBC 4.33 01/18/2020 1100   HGB 14.0 01/18/2020 1100   HCT 42.1 01/18/2020 1100   PLT 219 01/18/2020 1100   MCV 97.2 01/18/2020 1100   MCH 32.3 01/18/2020 1100   MCHC 33.3 01/18/2020 1100   RDW 12.4 01/18/2020 1100   LYMPHSABS  1.3 01/18/2020 1100   MONOABS 0.5 01/18/2020 1100   EOSABS 0.2 01/18/2020 1100   BASOSABS 0.0 01/18/2020 1100   CMP Latest Ref Rng & Units 01/18/2020 07/07/2017 11/30/2014  Glucose 70 - 99 mg/dL 139(H) 135(H) 90  BUN 8 - 23 mg/dL  _0 Creatinine 0.44 - 1.00 mg/dL 0.78 0.83 0.75  Sodium 135 - 145 mmol/L 137 138 139  Potassium 3.5 - 5.1 mmol/L 4.4 4.2 4.3  Chloride 98 - 111 mmol/L 102 102 104  CO2 22 - 32 mmol/L _1 Calcium 8.9 - 10.3 mg/dL 9.1 8.7(L) 9.2  Total Protein 6.5 - 8.1 g/dL 6.6 6.4(L) 6.7  Total Bilirubin 0.3 - 1.2 mg/dL 0.7 0.5 0.5  Alkaline Phos 38 - 126 U/L 89 70 120  AST 15 - 41 U/L _2 ALT 0 - 44 U/L _3 DIAGNOSTIC IMAGING:  I have independently reviewed the mammogram scans and discussed with the patient.  ASSESSMENT & PLAN:  Breast cancer ([place]) 1.  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 -Treated with left lumpectomy, followed by adjuvant radiation therapy. -Initially started antiestrogen therapy with Arimidex since 12/2012. -She was not able to tolerate Arimidex due to severe fatigue, switch to tamoxifen in 06/04/2015. -She completed AI therapy 12/2017. -Patient had BCI testing done 07/13/2017 and showed low likelihood of benefit with extended adjuvant therapy. -Mammogram screening on 02/16/2020 was BI-RADS Category 2 benign -Labs done on 01/18/2020 were all WNL. -Patient just had physical breast exam done by her PCP and did not want another.  It was reportedly normal. -She will return to the clinic in 1 year with repeat labs and mammogram.  2.  Bone health: -Last DEXA scan on 01/11/2017 was T score -1.0 -Patient is doing weightbearing exercises. -She continues to take calcium and vitamin D daily. -This is being followed by her PCP now.     Orders placed this encounter:  Orders Placed This Encounter  Procedures  . MM 3D SCREEN BREAST BILATERAL  . Lactate dehydrogenase  . CBC with Differential/Platelet  . Comprehensive metabolic panel  . Vitamin B12  . VITAMIN D 25 Hydroxy (Vit-D Deficiency, Fractures)      Francene Finders,  FNP-C [place] [phone]

## 2020-02-21 NOTE — Assessment & Plan Note (Signed)
1.  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 -Treated with left lumpectomy, followed by adjuvant radiation therapy. -Initially started antiestrogen therapy with Arimidex since 12/2012. -She was not able to tolerate Arimidex due to severe fatigue, switch to tamoxifen in 06/04/2015. -She completed AI therapy 12/2017. -Patient had BCI testing done 07/13/2017 and showed low likelihood of benefit with extended adjuvant therapy. -Mammogram screening on 02/16/2020 was BI-RADS Category 2 benign -Labs done on 01/18/2020 were all WNL. -Patient just had physical breast exam done by her PCP and did not want another.  It was reportedly normal. -She will return to the clinic in 1 year with repeat labs and mammogram.  2.  Bone health: -Last DEXA scan on 01/11/2017 was T score -1.0 -Patient is doing weightbearing exercises. -She continues to take calcium and vitamin D daily. -This is being followed by her PCP now.

## 2020-04-23 ENCOUNTER — Encounter (INDEPENDENT_AMBULATORY_CARE_PROVIDER_SITE_OTHER): Payer: Medicare HMO | Admitting: Ophthalmology

## 2020-05-03 ENCOUNTER — Encounter (INDEPENDENT_AMBULATORY_CARE_PROVIDER_SITE_OTHER): Payer: Self-pay | Admitting: Ophthalmology

## 2020-05-03 ENCOUNTER — Ambulatory Visit (INDEPENDENT_AMBULATORY_CARE_PROVIDER_SITE_OTHER): Payer: Medicare HMO | Admitting: Ophthalmology

## 2020-05-03 ENCOUNTER — Other Ambulatory Visit: Payer: Self-pay

## 2020-05-03 DIAGNOSIS — H3554 Dystrophies primarily involving the retinal pigment epithelium: Secondary | ICD-10-CM

## 2020-05-03 DIAGNOSIS — H353112 Nonexudative age-related macular degeneration, right eye, intermediate dry stage: Secondary | ICD-10-CM

## 2020-05-03 DIAGNOSIS — H04123 Dry eye syndrome of bilateral lacrimal glands: Secondary | ICD-10-CM | POA: Insufficient documentation

## 2020-05-03 NOTE — Patient Instructions (Signed)
Follow-up with Dr. Nathanial Rancher or [facility] for dry eye evaluation at any time

## 2020-05-03 NOTE — Assessment & Plan Note (Signed)
OD, stable  Similar cases of mine in the past have associated with vitreal macular traction syndrome and/or sleep apnea.  She status post vitrectomy right eye  And review of systems is marginal for sleep apnea.

## 2020-05-03 NOTE — Progress Notes (Signed)
05/03/2020     CHIEF COMPLAINT Patient presents for Retina Follow Up   HISTORY OF PRESENT ILLNESS: Kayla COGGIN is a 79 y.o. female who presents to the clinic today for:   HPI    Retina Follow Up    Patient presents with  Dry AMD.  In both eyes.  This started 4 months ago.  Severity is mild.  Duration of 4 months.  Since onset it is stable.          Comments    4 Month F/U OU  Pt c/o dryness OU, but sts Soothe drops help. No changes to [place] OU.       Last edited by Rockie Neighbours, [place] on 05/03/2020  8:45 AM. (History)      Referring physician: Asencion Noble, MD [address] [place],  [place] [address]  HISTORICAL INFORMATION:   Selected notes from the MEDICAL RECORD NUMBER       CURRENT MEDICATIONS: No current outpatient medications on file. (Ophthalmic Drugs)   No current facility-administered medications for this visit. (Ophthalmic Drugs)   Current Outpatient Medications (Other)  Medication Sig  . acetaminophen (TYLENOL) 325 MG tablet Take 650 mg by mouth every 6 (six) hours as needed (for pain.).  Marland Kitchen B Complex-C (SUPER B COMPLEX PO) Take 1 capsule by mouth daily.   . Calcium Carb-Cholecalciferol (CALCIUM 600/VITAMIN D3) 600-800 MG-UNIT TABS Take 1 capsule by mouth 2 (two) times daily.  . diclofenac sodium (VOLTAREN) 1 % GEL Apply 2-4 g topically 3 (three) times daily as needed (for pain.).   Marland Kitchen diphenhydrAMINE (BENADRYL) 25 mg capsule Take 25 mg by mouth at bedtime.  . Glucosamine-Chondroitin (COSAMIN DS PO) Take 1 tablet by mouth daily.  Marland Kitchen loperamide (IMODIUM) 2 MG capsule Take 2 mg by mouth 4 (four) times daily as needed for diarrhea or loose stools.   . Lutein 40 MG CAPS Take 40 mg by mouth daily.  . Multiple Vitamin (MULTIVITAMIN WITH MINERALS) TABS tablet Take 1 tablet by mouth daily.  . Multiple Vitamins-Minerals (PRESERVISION AREDS PO) Take by mouth.  Marland Kitchen omeprazole (PRILOSEC) 20 MG capsule Take 20 mg by mouth 2 (two) times daily before a meal.     . Turmeric 500 MG CAPS Take 500 mg by mouth daily.  . Vitamins-Lipotropics (COMPLEX B-100) TBCR Take by mouth.  . Zinc Sulfate (ZINC 15 PO) Take by mouth.   No current facility-administered medications for this visit. (Other)      REVIEW OF SYSTEMS:    ALLERGIES Allergies  Allergen Reactions  . Aspirin Other (See Comments)    REACTION: Abdominal pain   . Iron Anaphylaxis    REACTION: abdominal pain   . Sulfa Antibiotics Rash    Rash all over  . Sulfasalazine Rash    Rash all over  . Ancef [Cefazolin] Rash    PAST MEDICAL HISTORY Past Medical History:  Diagnosis Date  . Breast cancer ([place])   . Breast cancer ([place])   . Chronic back pain   . Gastric ulcer    History  . GERD (gastroesophageal reflux disease) 01/21/2006   EGD Dr Oneida Alar mild chronic gastritis, (NO h pylori) otherwise normal  . Headache(784.0)   . Sinus congestion   . Thumb tendonitis September 2014   left  . Uterine cancer ([place]) 1998  . Wears partial dentures    Past Surgical History:  Procedure Laterality Date  . ABDOMINAL HYSTERECTOMY    . ABSCESS DRAINAGE  03-04-05   insect bite  .  CARDIAC CATHETERIZATION N/A 10/31/2014   Procedure: Right/Left Heart Cath and Coronary Angiography;  Surgeon: Troy Sine, MD;  Location: [facility];  Service: Cardiovascular;  Laterality: N/A;  . CATARACT EXTRACTION W/PHACO  10/06/2011   Procedure: CATARACT EXTRACTION PHACO AND INTRAOCULAR LENS PLACEMENT (IOC);  Surgeon: Williams Che, MD;  Location: [facility];  Service: Ophthalmology;  Laterality: Left;  CDE:  5.76  . cataracts    . CHOLECYSTECTOMY  11/1999  . COLONOSCOPY  05/13/2011   Procedure: COLONOSCOPY;  Surgeon: Dorothyann Peng, MD;  [facility];  Service: Endoscopy;  Laterality: N/A;  8:30  . COLONOSCOPY N/A 04/09/2017   Dr. Oneida Alar: single tubular adenoma removed from the colon, redundant left colon, internal and external hemorrhoids, next colonoscopy in 10 to 15 years.  .  ESOPHAGOGASTRODUODENOSCOPY  01/21/06   mild antral erythema bx h-pylori/normal esophagus without evidence of mass or Barrett's/normal pylorus and duodenum  . PARTIAL MASTECTOMY WITH NEEDLE LOCALIZATION AND AXILLARY SENTINEL LYMPH NODE BX Left 08/18/2012   Procedure: PARTIAL MASTECTOMY WITH NEEDLE LOCALIZATION AND AXILLARY SENTINEL LYMPH NODE BX;  Surgeon: Jamesetta So, MD;  Location: [facility];  Service: General;  Laterality: Left;  Need Frozen Section/Sentinel Node Bx @ 8:00am/Needle Loc @ 9:00am  . POLYPECTOMY  04/09/2017   Procedure: POLYPECTOMY;  Surgeon: Danie Binder, MD;  [facility];  Service: Endoscopy;;  Rectal  . S/P Hysterectomy  1998   uterine ca  . temperal/mandibular  1974  . TONSILLECTOMY  1964  . TRIGGER FINGER RELEASE Right 05/04/2014   Procedure: RELEASE TRIGGER FINGER/A-1 PULLEY RIGHT RING FINGER;  Surgeon: Daryll Brod, MD;  Location: [facility];  Service: Orthopedics;  Laterality: Right;    FAMILY HISTORY Family History  Problem Relation Age of Onset  . Liver cancer Father   . Alcohol abuse Father   . Hypertension Mother   . COPD Mother   . Diabetes Brother   . Other Son        knee and back problems    SOCIAL HISTORY Social History   Tobacco Use  . Smoking status: Never Smoker  . Smokeless tobacco: Never Used  Substance Use Topics  . Alcohol use: No  . Drug use: No         OPHTHALMIC EXAM:  Base Eye Exam    Visual Acuity (ETDRS)      Right Left   Dist cc 20/50 -2 20/40   Dist ph cc NI 20/25 -2   Correction: Glasses       Tonometry (Tonopen, 8:45 AM)      Right Left   Pressure 13 12       Pupils      Pupils Dark Light Shape React APD   Right PERRL 4 3 Round Brisk None   Left PERRL 4 3 Round Brisk None       Visual Fields (Counting fingers)      Left Right    Full Full       Extraocular Movement      Right Left    Full Full       Neuro/Psych    Oriented x3: Yes   Mood/Affect: Normal       Dilation     Both eyes: 1.0% Mydriacyl, 2.5% Phenylephrine @ 8:49 AM        Slit Lamp and Fundus Exam    External Exam      Right Left   External Normal Normal  Slit Lamp Exam      Right Left   Lids/Lashes Normal Normal   Conjunctiva/Sclera White and quiet White and quiet   Cornea Clear Clear   Anterior Chamber Deep and quiet Deep and quiet   Iris Round and reactive Round and reactive   Lens Posterior chamber intraocular lens Posterior chamber intraocular lens   Anterior Vitreous Normal Normal       Fundus Exam      Right Left   Posterior Vitreous Vitrectomized Normal   Disc Normal Normal   C/D Ratio 0.0 0.25   Macula Yellowish pseudovitelliform type drusenoid PED in the fovea, no hemorrhage, no exudates, no change Hard drusen, no exudates, no macular thickening, Retinal pigment epithelial mottling   Vessels Normal Normal   Periphery Normal Normal          IMAGING AND PROCEDURES  Imaging and Procedures for 05/03/20  OCT, Retina - OU - Both Eyes       Right Eye Quality was good. Scan locations included subfoveal. Central Foveal Thickness: 474. Progression has been stable. Findings include subretinal hyper-reflective material.   Left Eye Quality was good. Scan locations included subfoveal. Central Foveal Thickness: 246. Progression has been stable. Findings include normal foveal contour, outer retinal atrophy.   Notes Large drusenoid subfoveal deposit of adult vitelliform macular dystrophy OD  OS with outer retinal receptor layer disruption, no change since first visit February 2021                ASSESSMENT/PLAN:  Intermediate stage nonexudative age-related macular degeneration of right eye Large vitelliform subfoveal deposit, no change since first evaluation February 2021  Adult onset vitelliform macular dystrophy OD, stable  Similar cases of mine in the past have associated with vitreal macular traction syndrome and/or sleep apnea.  She status post  vitrectomy right eye  And review of systems is marginal for sleep apnea.      ICD-10-CM   1. 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 Adult onset vitelliform macular dystrophy  H35.54   3. Dry eyes, bilateral  H04.123     1.  We will continue to monitor the the morphology of the macula in each eye.  Patient instructed to contact the office promptly for new onset visual acuity distortion or decline  2.  Patient has ongoing dry eyes, I do recommend she seek formal evaluation with [facility] for symptoms are problematic otherwise over-the-counter medications, teardrops  3.  Ophthalmic Meds Ordered this visit:  No orders of the defined types were placed in this encounter.      Return in about 6 months (around 10/31/2020) for DILATE OU, OCT.  Patient Instructions  Follow-up with Dr. Nathanial Rancher or [facility] for dry eye evaluation at any time    Explained the diagnoses, plan, and follow up with the patient and they expressed understanding.  Patient expressed understanding of the importance of proper follow up care.   Clent Demark Clelia Trabucco M.D. Diseases & Surgery of the Retina and Vitreous Retina & Diabetic [place] 05/03/20     Abbreviations: M myopia (nearsighted); A astigmatism; H hyperopia (farsighted); P presbyopia; Mrx spectacle prescription;  CTL contact lenses; OD right eye; OS left eye; OU both eyes  XT exotropia; ET esotropia; PEK punctate epithelial keratitis; PEE punctate epithelial erosions; DES dry eye syndrome; MGD meibomian gland dysfunction; ATs artificial tears; PFAT's preservative free artificial tears; [place] nuclear sclerotic  cataract; PSC posterior subcapsular cataract; ERM epi-retinal membrane; PVD posterior vitreous detachment; RD retinal detachment; DM diabetes mellitus; DR diabetic retinopathy; NPDR non-proliferative diabetic retinopathy; PDR proliferative diabetic  retinopathy; CSME clinically significant macular edema; DME diabetic macular edema; dbh dot blot hemorrhages; CWS cotton wool spot; POAG primary open angle glaucoma; C/D cup-to-disc ratio; HVF humphrey visual field; GVF goldmann visual field; OCT optical coherence tomography; IOP intraocular pressure; BRVO Branch retinal vein occlusion; CRVO central retinal vein occlusion; CRAO central retinal artery occlusion; BRAO branch retinal artery occlusion; RT retinal tear; SB scleral buckle; PPV pars plana vitrectomy; VH Vitreous hemorrhage; PRP panretinal laser photocoagulation; IVK intravitreal kenalog; VMT vitreomacular traction; MH Macular hole;  NVD neovascularization of the disc; NVE neovascularization elsewhere; AREDS age related eye disease study; ARMD age related macular degeneration; POAG primary open angle glaucoma; EBMD epithelial/anterior basement membrane dystrophy; ACIOL anterior chamber intraocular lens; IOL intraocular lens; PCIOL posterior chamber intraocular lens; Phaco/IOL phacoemulsification with intraocular lens placement; [place] photorefractive keratectomy; LASIK laser assisted in situ keratomileusis; HTN hypertension; DM diabetes mellitus; COPD chronic obstructive pulmonary disease

## 2020-05-03 NOTE — Assessment & Plan Note (Signed)
Large vitelliform subfoveal deposit, no change since first evaluation February 2021

## 2020-11-01 ENCOUNTER — Other Ambulatory Visit: Payer: Self-pay

## 2020-11-01 ENCOUNTER — Ambulatory Visit (INDEPENDENT_AMBULATORY_CARE_PROVIDER_SITE_OTHER): Payer: Medicare HMO | Admitting: Ophthalmology

## 2020-11-01 ENCOUNTER — Encounter (INDEPENDENT_AMBULATORY_CARE_PROVIDER_SITE_OTHER): Payer: Self-pay | Admitting: Ophthalmology

## 2020-11-01 DIAGNOSIS — H43821 Vitreomacular adhesion, right eye: Secondary | ICD-10-CM

## 2020-11-01 DIAGNOSIS — H353112 Nonexudative age-related macular degeneration, right eye, intermediate dry stage: Secondary | ICD-10-CM

## 2020-11-01 DIAGNOSIS — H3554 Dystrophies primarily involving the retinal pigment epithelium: Secondary | ICD-10-CM | POA: Diagnosis not present

## 2020-11-01 NOTE — Assessment & Plan Note (Signed)
OD large subfoveal drusenoid deposit, accounts for acuity.  Negative review of systems for sleep apnea although she does not have any witnessed snoring because she is lives alone, occasionally dozes not wake up more than once nightly

## 2020-11-01 NOTE — Progress Notes (Signed)
11/01/2020     CHIEF COMPLAINT Patient presents for Retina Follow Up (6 month fu ou and OCT/Pt states [facility] OU stable since last visit. Pt denies FOL, floaters, or ocular pain OU. /)   HISTORY OF PRESENT ILLNESS: Kayla Thomas is a 80 y.o. female who presents to the clinic today for:   HPI    Retina Follow Up    Diagnosis: Dry AMD   Laterality: right eye   Onset: 6 months ago   Severity: mild   Duration: 6 months   Comments: 6 month fu ou and OCT Pt states VA OU stable since last visit. Pt denies FOL, floaters, or ocular pain OU.         Last edited by Kendra Opitz, COA on 11/01/2020  8:30 AM. (History)      Referring physician: Asencion Noble, MD [address] [place],  [place] [address]  HISTORICAL INFORMATION:   Selected notes from the MEDICAL RECORD NUMBER       CURRENT MEDICATIONS: No current outpatient medications on file. (Ophthalmic Drugs)   No current facility-administered medications for this visit. (Ophthalmic Drugs)   Current Outpatient Medications (Other)  Medication Sig  . acetaminophen (TYLENOL) 325 MG tablet Take 650 mg by mouth every 6 (six) hours as needed (for pain.).  Marland Kitchen B Complex-C (SUPER B COMPLEX PO) Take 1 capsule by mouth daily.   . Calcium Carb-Cholecalciferol (CALCIUM 600/VITAMIN D3) 600-800 MG-UNIT TABS Take 1 capsule by mouth 2 (two) times daily.  . diclofenac sodium (VOLTAREN) 1 % GEL Apply 2-4 g topically 3 (three) times daily as needed (for pain.).   Marland Kitchen diphenhydrAMINE (BENADRYL) 25 mg capsule Take 25 mg by mouth at bedtime.  . Glucosamine-Chondroitin (COSAMIN DS PO) Take 1 tablet by mouth daily.  Marland Kitchen loperamide (IMODIUM) 2 MG capsule Take 2 mg by mouth 4 (four) times daily as needed for diarrhea or loose stools.   . Lutein 40 MG CAPS Take 40 mg by mouth daily.  . Multiple Vitamin (MULTIVITAMIN WITH MINERALS) TABS tablet Take 1 tablet by mouth daily.  . Multiple Vitamins-Minerals (PRESERVISION AREDS PO) Take by mouth.  Marland Kitchen  omeprazole (PRILOSEC) 20 MG capsule Take 20 mg by mouth 2 (two) times daily before a meal.   . Turmeric 500 MG CAPS Take 500 mg by mouth daily.  . Vitamins-Lipotropics (COMPLEX B-100) TBCR Take by mouth.  . Zinc Sulfate (ZINC 15 PO) Take by mouth.   No current facility-administered medications for this visit. (Other)      REVIEW OF SYSTEMS:    ALLERGIES Allergies  Allergen Reactions  . Aspirin Other (See Comments)    REACTION: Abdominal pain   . Iron Anaphylaxis    REACTION: abdominal pain   . Sulfa Antibiotics Rash    Rash all over  . Sulfasalazine Rash    Rash all over  . Ancef [Cefazolin] Rash    PAST MEDICAL HISTORY Past Medical History:  Diagnosis Date  . Breast cancer ([place])   . Breast cancer ([place])   . Chronic back pain   . Gastric ulcer    History  . GERD (gastroesophageal reflux disease) 01/21/2006   EGD Dr Oneida Alar mild chronic gastritis, (NO h pylori) otherwise normal  . Headache(784.0)   . Sinus congestion   . Thumb tendonitis September 2014   left  . Uterine cancer ([place]) 1998  . 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 Wears partial  dentures    Past Surgical History:  Procedure Laterality Date  . ABDOMINAL HYSTERECTOMY    . ABSCESS DRAINAGE  03-04-05   insect bite  . CARDIAC CATHETERIZATION N/A 10/31/2014   Procedure: Right/Left Heart Cath and Coronary Angiography;  Surgeon: Troy Sine, MD;  Location: [facility];  Service: Cardiovascular;  Laterality: N/A;  . CATARACT EXTRACTION W/PHACO  10/06/2011   Procedure: CATARACT EXTRACTION PHACO AND INTRAOCULAR LENS PLACEMENT (IOC);  Surgeon: Williams Che, MD;  Location[facility];  Service: Ophthalmology;  Laterality: Left;  CDE:  5.76  . cataracts    . CHOLECYSTECTOMY  11/1999  . COLONOSCOPY  05/13/2011   Procedure: COLONOSCOPY;  Surgeon: Dorothyann Peng, MD;  [facility];  Service: Endoscopy;  Laterality: N/A;   8:30  . COLONOSCOPY N/A 04/09/2017   Dr. Oneida Alar: single tubular adenoma removed from the colon, redundant left colon, internal and external hemorrhoids, next colonoscopy in 10 to 15 years.  . ESOPHAGOGASTRODUODENOSCOPY  01/21/06   mild antral erythema bx h-pylori/normal esophagus without evidence of mass or Barrett's/normal pylorus and duodenum  . PARTIAL MASTECTOMY WITH NEEDLE LOCALIZATION AND AXILLARY SENTINEL LYMPH NODE BX Left 08/18/2012   Procedure: PARTIAL MASTECTOMY WITH NEEDLE LOCALIZATION AND AXILLARY SENTINEL LYMPH NODE BX;  Surgeon: Jamesetta So, MD;  Location: [facility];  Service: General;  Laterality: Left;  Need Frozen Section/Sentinel Node Bx @ 8:00am/Needle Loc @ 9:00am  . POLYPECTOMY  04/09/2017   Procedure: POLYPECTOMY;  Surgeon: Danie Binder, MD;  [facility];  Service: Endoscopy;;  Rectal  . S/P Hysterectomy  1998   uterine ca  . temperal/mandibular  1974  . TONSILLECTOMY  1964  . TRIGGER FINGER RELEASE Right 05/04/2014   Procedure: RELEASE TRIGGER FINGER/A-1 PULLEY RIGHT RING FINGER;  Surgeon: Daryll Brod, MD;  Location: [facility];  Service: Orthopedics;  Laterality: Right;    FAMILY HISTORY Family History  Problem Relation Age of Onset  . Liver cancer Father   . Alcohol abuse Father   . Hypertension Mother   . COPD Mother   . Diabetes Brother   . Other Son        knee and back problems    SOCIAL HISTORY Social History   Tobacco Use  . Smoking status: Never Smoker  . Smokeless tobacco: Never Used  Substance Use Topics  . Alcohol use: No  . Drug use: No         OPHTHALMIC EXAM: Base Eye Exam    Visual Acuity (ETDRS)      Right Left   Dist cc 20/70 -1 20/25 -2   Dist ph cc NI    Correction: Glasses       Tonometry (Tonopen, 8:34 AM)      Right Left   Pressure 09 10       Pupils      Pupils Dark Light Shape React APD   Right PERRL 4 3 Round Brisk None   Left PERRL 4 3 Round Brisk None       Visual Fields  (Counting fingers)      Left Right    Full Full       Extraocular Movement      Right Left    Full Full       Neuro/Psych    Oriented x3: Yes   Mood/Affect: Normal       Dilation    Both eyes: 1.0% Mydriacyl, 2.5% Phenylephrine @ 8:34 AM  Slit Lamp and Fundus Exam    External Exam      Right Left   External Normal Normal       Slit Lamp Exam      Right Left   Lids/Lashes Normal Normal   Conjunctiva/Sclera White and quiet White and quiet   Cornea Clear Clear   Anterior Chamber Deep and quiet Deep and quiet   Iris Round and reactive Round and reactive   Lens Posterior chamber intraocular lens Posterior chamber intraocular lens   Anterior Vitreous Normal Normal       Fundus Exam      Right Left   Posterior Vitreous Vitrectomized Normal   Disc Normal Normal   C/D Ratio 0.0 0.25   Macula Yellowish pseudovitelliform type drusenoid PED in the fovea, no hemorrhage, no exudates, no change Hard drusen, no exudates, no macular thickening, Retinal pigment epithelial mottling   Vessels Normal Normal   Periphery Normal Normal          IMAGING AND PROCEDURES  Imaging and Procedures for 11/01/20  OCT, Retina - OU - Both Eyes       Right Eye Quality was good. Scan locations included subfoveal. Central Foveal Thickness: 469. Progression has been stable. Findings include subretinal hyper-reflective material.   Left Eye Quality was good. Scan locations included subfoveal. Central Foveal Thickness: 246. Progression has been stable. Findings include normal foveal contour, outer retinal atrophy.   Notes Large drusenoid subfoveal deposit of adult vitelliform macular dystrophy OD, overall improved slightly with more intense subretinal hyper reflective material, less translucency to OCT imaging.  Outer retinal photoreceptor layer change subfoveal OD accounts for acuity, no signs of CNVM  OS with outer retinal receptor layer disruption, no change since first visit  February 2021                ASSESSMENT/PLAN:  Adult onset vitelliform macular dystrophy OD large subfoveal drusenoid deposit, accounts for acuity.  Negative review of systems for sleep apnea although she does not have any witnessed snoring because she is lives alone, occasionally dozes not wake up more than once nightly  Intermediate stage nonexudative age-related macular degeneration of right eye Stable OU      ICD-10-CM   1. 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 Adult onset vitelliform macular dystrophy  H35.54   3. Vitreomacular adhesion of right eye  H43.821     1.  OD, minor interval change of the last 6 months with smaller accumulation of subretinal yellowish debris, has postvitrectomy releasing vitreomacular adhesion and traction.  Minor epiretinal membrane overlying this area not visually significant  2.  Intermediate ARMD OU stable  3.  Ophthalmic Meds Ordered this visit:  No orders of the defined types were placed in this encounter.      Return in about 6 months (around 05/04/2021) for DILATE OU, OCT.  There are no Patient Instructions on file for this visit.   Explained the diagnoses, plan, and follow up with the patient and they expressed understanding.  Patient expressed understanding of the importance of proper follow up care.   Clent Demark Gavin Faivre M.D. Diseases & Surgery of the Retina and Vitreous Retina & Diabetic [place] 11/01/20     Abbreviations: M myopia (nearsighted); A astigmatism; H hyperopia (farsighted); P presbyopia; Mrx spectacle prescription;  CTL contact lenses; OD right eye; OS left eye; OU both eyes  XT exotropia; ET esotropia; PEK punctate  epithelial keratitis; PEE punctate epithelial erosions; DES dry eye syndrome; MGD meibomian gland dysfunction; ATs artificial tears; PFAT's preservative free artificial tears; [place] nuclear sclerotic cataract; PSC  posterior subcapsular cataract; ERM epi-retinal membrane; PVD posterior vitreous detachment; RD retinal detachment; DM diabetes mellitus; DR diabetic retinopathy; NPDR non-proliferative diabetic retinopathy; PDR proliferative diabetic retinopathy; CSME clinically significant macular edema; DME diabetic macular edema; dbh dot blot hemorrhages; CWS cotton wool spot; POAG primary open angle glaucoma; C/D cup-to-disc ratio; HVF humphrey visual field; GVF goldmann visual field; OCT optical coherence tomography; IOP intraocular pressure; BRVO Branch retinal vein occlusion; CRVO central retinal vein occlusion; CRAO central retinal artery occlusion; BRAO branch retinal artery occlusion; RT retinal tear; SB scleral buckle; PPV pars plana vitrectomy; VH Vitreous hemorrhage; PRP panretinal laser photocoagulation; IVK intravitreal kenalog; VMT vitreomacular traction; MH Macular hole;  NVD neovascularization of the disc; NVE neovascularization elsewhere; AREDS age related eye disease study; ARMD age related macular degeneration; POAG primary open angle glaucoma; EBMD epithelial/anterior basement membrane dystrophy; ACIOL anterior chamber intraocular lens; IOL intraocular lens; PCIOL posterior chamber intraocular lens; Phaco/IOL phacoemulsification with intraocular lens placement; [place] photorefractive keratectomy; LASIK laser assisted in situ keratomileusis; HTN hypertension; DM diabetes mellitus; COPD chronic obstructive pulmonary disease

## 2020-11-01 NOTE — Assessment & Plan Note (Signed)
Stable OU 

## 2021-02-15 ENCOUNTER — Other Ambulatory Visit (HOSPITAL_COMMUNITY): Payer: Self-pay

## 2021-02-15 DIAGNOSIS — C50419 Malignant neoplasm of upper-outer quadrant of unspecified female breast: Secondary | ICD-10-CM

## 2021-02-18 ENCOUNTER — Inpatient Hospital Stay (HOSPITAL_COMMUNITY): Payer: Medicare HMO | Attending: Hematology

## 2021-02-18 ENCOUNTER — Ambulatory Visit (HOSPITAL_COMMUNITY)
Admission: RE | Admit: 2021-02-18 | Discharge: 2021-02-18 | Disposition: A | Discharge status: Home / Self Care | Payer: Medicare HMO | Source: Ambulatory Visit | Attending: Nurse Practitioner | Admitting: Nurse Practitioner

## 2021-02-18 ENCOUNTER — Other Ambulatory Visit: Payer: Self-pay

## 2021-02-18 DIAGNOSIS — C50412 Malignant neoplasm of upper-outer quadrant of left female breast: Secondary | ICD-10-CM | POA: Diagnosis present

## 2021-02-18 DIAGNOSIS — C50419 Malignant neoplasm of upper-outer quadrant of unspecified female breast: Secondary | ICD-10-CM

## 2021-02-18 DIAGNOSIS — Z170 Estrogen receptor positive status [ER+]: Secondary | ICD-10-CM

## 2021-02-18 DIAGNOSIS — Z1231 Encounter for screening mammogram for malignant neoplasm of breast: Secondary | ICD-10-CM | POA: Insufficient documentation

## 2021-02-18 LAB — COMPREHENSIVE METABOLIC PANEL
ALT: 19 U/L (ref 0–44)
AST: 18 U/L (ref 15–41)
Albumin: 4.1 g/dL (ref 3.5–5.0)
Alkaline Phosphatase: 94 U/L (ref 38–126)
Anion gap: 6 (ref 5–15)
BUN: 14 mg/dL (ref 8–23)
CO2: 29 mmol/L (ref 22–32)
Calcium: 9 mg/dL (ref 8.9–10.3)
Chloride: 102 mmol/L (ref 98–111)
Creatinine, Ser: 0.73 mg/dL (ref 0.44–1.00)
GFR, Estimated: >60 mL/min (ref >60)
Glucose, Bld: 175 mg/dL — ABNORMAL HIGH (ref 70–99)
Potassium: 4.9 mmol/L (ref 3.5–5.1)
Sodium: 137 mmol/L (ref 135–145)
Total Bilirubin: 0.4 mg/dL (ref 0.3–1.2)
Total Protein: 6.7 g/dL (ref 6.5–8.1)

## 2021-02-18 LAB — CBC WITH DIFFERENTIAL/PLATELET
Abs Immature Granulocytes: 0.03 10*3/uL (ref 0.00–0.07)
Basophils Absolute: 0.1 10*3/uL (ref 0.0–0.1)
Basophils Relative: 1 %
Eosinophils Absolute: 0.2 10*3/uL (ref 0.0–0.5)
Eosinophils Relative: 3 %
HCT: 41.7 % (ref 36.0–46.0)
Hemoglobin: 13.6 g/dL (ref 12.0–15.0)
Immature Granulocytes: 0 %
Lymphocytes Relative: 19 %
Lymphs Abs: 1.3 10*3/uL (ref 0.7–4.0)
MCH: 32 pg (ref 26.0–34.0)
MCHC: 32.6 g/dL (ref 30.0–36.0)
MCV: 98.1 fL (ref 80.0–100.0)
Monocytes Absolute: 0.5 10*3/uL (ref 0.1–1.0)
Monocytes Relative: 7 %
Neutro Abs: 5 10*3/uL (ref 1.7–7.7)
Neutrophils Relative %: 70 %
Platelets: 229 10*3/uL (ref 150–400)
RBC: 4.25 MIL/uL (ref 3.87–5.11)
RDW: 13.1 % (ref 11.5–15.5)
WBC: 7.1 10*3/uL (ref 4.0–10.5)
nRBC: 0 % (ref 0.0–0.2)

## 2021-02-18 LAB — VITAMIN B12: Vitamin B-12: 321 pg/mL (ref 180–914)

## 2021-02-18 LAB — VITAMIN D 25 HYDROXY (VIT D DEFICIENCY, FRACTURES): Vit D, 25-Hydroxy: 33.87 ng/mL (ref 30–100)

## 2021-02-18 LAB — LACTATE DEHYDROGENASE: LDH: 170 U/L (ref 98–192)

## 2021-02-24 NOTE — Progress Notes (Signed)
[facility] [address] [place] [address]   Patient Care Team: Carylon Perches, MD as PCP - General (Internal Medicine) [place] Bali, MD (Inactive) (Gastroenterology) Laqueta Linden, MD (Inactive) as Attending Physician (Cardiology)  SUMMARY OF ONCOLOGIC HISTORY: Oncology History  Breast cancer [facility])   Initial Diagnosis   Breast cancer (HCC)   07/13/2017 Miscellaneous   BCI results revealed LOW risk of late recurrence (3%) and LOW likelihood of benefit from extended anti-estrogen therapy.      CHIEF COMPLIANT: Follow-up of breast cancer   INTERVAL HISTORY: Kayla Thomas is a 80 y.o. female here today for follow up of her breast cancer. Her last visit was on 02/21/2020 by Mathis Bud NP-C.   Today she reports feeling good. She completed Tamoxifen in 2019. She takes 1000 units of Vitamin D daily.  REVIEW OF SYSTEMS:   Review of Systems  Constitutional:  Negative for appetite change and fatigue (75%).  Gastrointestinal:  Positive for diarrhea.  Neurological:  Positive for headaches (vertigo).  All other systems reviewed and are negative.  I have reviewed the past medical history, past surgical history, social history and family history with the patient and they are unchanged from previous note.   ALLERGIES:   is allergic to aspirin, iron, sulfa antibiotics, sulfasalazine, and ancef [cefazolin].   MEDICATIONS:  Current Outpatient Medications  Medication Sig Dispense Refill   Bilberry, Vaccinium myrtillus, (BILBERRY EXTRACT PO) Take 1 capsule by mouth daily.     acetaminophen (TYLENOL) 325 MG tablet Take 650 mg by mouth every 6 (six) hours as needed (for pain.).     B Complex-C (SUPER B COMPLEX PO) Take 1 capsule by mouth daily.      Calcium Carb-Cholecalciferol (CALCIUM 600/VITAMIN D3) 600-800 MG-UNIT TABS Take 1 capsule by mouth 2 (two) times daily.     diclofenac sodium (VOLTAREN) 1 % GEL Apply 2-4 g topically 3 (three) times daily  as needed (for pain.).      diphenhydrAMINE (BENADRYL) 25 mg capsule Take 25 mg by mouth at bedtime.     Glucosamine-Chondroitin (COSAMIN DS PO) Take 1 tablet by mouth daily.     loperamide (IMODIUM) 2 MG capsule Take 2 mg by mouth 4 (four) times daily as needed for diarrhea or loose stools.      methocarbamol (ROBAXIN) 500 MG tablet Take 500 mg by mouth 2 (two) times daily.     Multiple Vitamin (MULTIVITAMIN WITH MINERALS) TABS tablet Take 1 tablet by mouth daily.     Multiple Vitamins-Minerals (PRESERVISION AREDS PO) Take by mouth.     omeprazole (PRILOSEC) 20 MG capsule Take 20 mg by mouth 2 (two) times daily before a meal.      Turmeric 500 MG CAPS Take 500 mg by mouth daily.     Zinc Sulfate (ZINC 15 PO) Take 1 tablet by mouth daily.     No current facility-administered medications for this visit.     PHYSICAL EXAMINATION: Performance status (ECOG): 1 - Symptomatic but completely ambulatory  Vitals:   02/26/21 1026  BP: (!) 144/64  Pulse: 61  Resp: 17  Temp: (!) 97 F (36.1 C)  SpO2: 97%   Wt Readings from Last 3 Encounters:  02/26/21 180 lb 12.8 oz (82 kg)  02/21/20 181 lb 14.1 oz (82.5 kg)  01/20/19 185 lb (83.9 kg)   Physical Exam Vitals reviewed.  Constitutional:      Appearance: Normal appearance.  Cardiovascular:     Rate and  Rhythm: Normal rate and regular rhythm.     Pulses: Normal pulses.     Heart sounds: Normal heart sounds.  Pulmonary:     Effort: Pulmonary effort is normal.     Breath sounds: Normal breath sounds.  Chest:  Breasts:    Right: Normal.     Left: No skin change (UOQ lumpectomy scar stable).  Abdominal:     Palpations: Abdomen is soft. There is no hepatomegaly, splenomegaly or mass.     Tenderness: There is no abdominal tenderness.  Lymphadenopathy:     Upper Body:     Right upper body: No supraclavicular, axillary or pectoral adenopathy.     Left upper body: No supraclavicular, axillary or pectoral adenopathy.  Neurological:      General: No focal deficit present.     Mental Status: She is alert and oriented to person, place, and time.  Psychiatric:        Mood and Affect: Mood normal.        Behavior: Behavior normal.    Breast Exam Chaperone: Thana Ates     LABORATORY DATA:  I have reviewed the data as listed CMP Latest Ref Rng & Units 02/18/2021 01/18/2020 07/07/2017  Glucose 70 - 99 mg/dL 175(H) 139(H) 135(H)  BUN 8 - 23 mg/dL $Remove'14 15 18  'KhQvPaO$ Creatinine 0.44 - 1.00 mg/dL 0.73 0.78 0.83  Sodium 135 - 145 mmol/L 137 137 138  Potassium 3.5 - 5.1 mmol/L 4.9 4.4 4.2  Chloride 98 - 111 mmol/L 102 102 102  CO2 22 - 32 mmol/L $RemoveB'29 26 26  'uKSRQJbO$ Calcium 8.9 - 10.3 mg/dL 9.0 9.1 8.7(L)  Total Protein 6.5 - 8.1 g/dL 6.7 6.6 6.4(L)  Total Bilirubin 0.3 - 1.2 mg/dL 0.4 0.7 0.5  Alkaline Phos 38 - 126 U/L 94 89 70  AST 15 - 41 U/L $Remo'18 18 19  'FBhRM$ ALT 0 - 44 U/L $Remo'19 17 20   'dWNtl$ No results found for: TLX726 Lab Results  Component Value Date   WBC 7.1 02/18/2021   HGB 13.6 02/18/2021   HCT 41.7 02/18/2021   MCV 98.1 02/18/2021   PLT 229 02/18/2021   NEUTROABS 5.0 02/18/2021    ASSESSMENT:  1.  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  Status post lumpectomy followed by adjuvant radiation. - Anastrozole started in July 2014, switched to tamoxifen in December 2016 completed in July 2019. - BCI testing in January 2019 with low likelihood of benefit with extended adjuvant therapy.  2.  Bone health: - Last DEXA scan on 12/12/2016 T score -1.   PLAN:  1.  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 - Reviewed mammogram from 02/18/2021, BI-RADS Category 0 with calcifications in the left breast.  Right breast has no suspicious findings. - She will have additional imaging with a diagnostic left breast mammogram and ultrasound followed by biopsy if needed. - I have reviewed her  labs from 02/18/2021 which were grossly within normal limits.  RTC 1 year for follow-up with repeat mammogram.  2.  Bone health: - Vitamin D level was 33.  Continue vitamin D supplements.  Breast Cancer therapy associated bone loss: I have recommended calcium, Vitamin D and weight bearing exercises.  Orders placed this encounter:  No orders of the defined types were placed in this encounter.   The patient has a good understanding of the overall plan.  She agrees with it. She will call with any problems that may develop before the next visit here.  Derek Jack, MD [place] [phone]   I, Thana Ates, am acting as a scribe for Dr. Derek Jack.  I, Derek Jack MD, have reviewed the above documentation for accuracy and completeness, and I agree with the above.

## 2021-02-26 ENCOUNTER — Encounter (HOSPITAL_COMMUNITY): Payer: Self-pay | Admitting: Hematology

## 2021-02-26 ENCOUNTER — Other Ambulatory Visit (HOSPITAL_COMMUNITY): Payer: Self-pay | Admitting: Internal Medicine

## 2021-02-26 ENCOUNTER — Inpatient Hospital Stay (HOSPITAL_COMMUNITY): Payer: Medicare HMO | Attending: Hematology | Admitting: Hematology

## 2021-02-26 ENCOUNTER — Other Ambulatory Visit: Payer: Self-pay

## 2021-02-26 VITALS — BP 144/64 | HR 61 | Temp 97.0°F | Resp 17 | Wt 180.8 lb

## 2021-02-26 DIAGNOSIS — Z853 Personal history of malignant neoplasm of breast: Secondary | ICD-10-CM | POA: Diagnosis not present

## 2021-02-26 DIAGNOSIS — Z170 Estrogen receptor positive status [ER+]: Secondary | ICD-10-CM | POA: Diagnosis not present

## 2021-02-26 DIAGNOSIS — C50419 Malignant neoplasm of upper-outer quadrant of unspecified female breast: Secondary | ICD-10-CM

## 2021-02-26 DIAGNOSIS — R928 Other abnormal and inconclusive findings on diagnostic imaging of breast: Secondary | ICD-10-CM

## 2021-02-26 DIAGNOSIS — Z1231 Encounter for screening mammogram for malignant neoplasm of breast: Secondary | ICD-10-CM

## 2021-02-26 DIAGNOSIS — Z79899 Other long term (current) drug therapy: Secondary | ICD-10-CM | POA: Insufficient documentation

## 2021-02-26 NOTE — Patient Instructions (Addendum)
[place] at [facility] Discharge Instructions  You were seen today by Dr. Delton Coombes. He went over your recent results. You will be scheduled for a mammogram after 02/12/2022. Dr. Delton Coombes will see you back in 1 year for labs and follow up.   Thank you for choosing [place] at [facility] to provide your oncology and hematology care.  To afford each patient quality time with our provider, please arrive at least 15 minutes before your scheduled appointment time.   If you have a lab appointment with the [place] please come in thru the Main Entrance and check in at the main information desk  You need to re-schedule your appointment should you arrive 10 or more minutes late.  We strive to give you quality time with our providers, and arriving late affects you and other patients whose appointments are after yours.  Also, if you no show three or more times for appointments you may be dismissed from the clinic at the providers discretion.     Again, thank you for choosing [facility].  Our hope is that these requests will decrease the amount of time that you wait before being seen by our physicians.       _____________________________________________________________  Should you have questions after your visit to [facility], please contact our office at [phone] between the hours of 8:00 a.m. and 4:30 p.m.  Voicemails left after 4:00 p.m. will not be returned until the following business day.  For prescription refill requests, have your pharmacy contact our office and allow 72 hours.    Cancer Center Support Programs:   > Cancer Support Group  2nd Tuesday of the month 1pm-2pm, Journey Room

## 2021-03-07 ENCOUNTER — Other Ambulatory Visit: Payer: Self-pay

## 2021-03-07 ENCOUNTER — Ambulatory Visit (HOSPITAL_COMMUNITY)
Admission: RE | Admit: 2021-03-07 | Discharge: 2021-03-07 | Disposition: A | Discharge status: Home / Self Care | Payer: Medicare HMO | Source: Ambulatory Visit | Attending: Internal Medicine | Admitting: Internal Medicine

## 2021-03-07 ENCOUNTER — Encounter (HOSPITAL_COMMUNITY): Payer: Self-pay

## 2021-03-07 DIAGNOSIS — R928 Other abnormal and inconclusive findings on diagnostic imaging of breast: Secondary | ICD-10-CM

## 2021-03-14 ENCOUNTER — Other Ambulatory Visit (HOSPITAL_COMMUNITY): Payer: Self-pay | Admitting: Internal Medicine

## 2021-03-14 DIAGNOSIS — R921 Mammographic calcification found on diagnostic imaging of breast: Secondary | ICD-10-CM

## 2021-05-09 ENCOUNTER — Other Ambulatory Visit: Payer: Self-pay

## 2021-05-09 ENCOUNTER — Encounter (INDEPENDENT_AMBULATORY_CARE_PROVIDER_SITE_OTHER): Payer: Medicare HMO | Admitting: Ophthalmology

## 2021-05-09 ENCOUNTER — Encounter: Payer: Self-pay | Admitting: Emergency Medicine

## 2021-05-09 ENCOUNTER — Ambulatory Visit
Admission: EM | Admit: 2021-05-09 | Discharge: 2021-05-09 | Disposition: A | Discharge status: Home / Self Care | Payer: Medicare HMO | Attending: Emergency Medicine | Admitting: Emergency Medicine

## 2021-05-09 DIAGNOSIS — L304 Erythema intertrigo: Secondary | ICD-10-CM

## 2021-05-09 MED ORDER — MICONAZOLE NITRATE 2 % EX CREA
1.0000 | TOPICAL_CREAM | Freq: Two times a day (BID) | CUTANEOUS | 0 refills | Status: DC
Start: 2021-05-09 — End: 2022-02-20

## 2021-05-09 MED ORDER — FLUCONAZOLE 150 MG PO TABS
150.0000 mg | ORAL_TABLET | Freq: Once | ORAL | 0 refills | Status: AC
Start: 2021-05-09 — End: 2021-05-09

## 2021-05-09 MED ORDER — HYDROCORTISONE 2.5 % EX LOTN
TOPICAL_LOTION | Freq: Every day | CUTANEOUS | 0 refills | Status: DC
Start: 2021-05-09 — End: 2023-11-02

## 2021-05-09 NOTE — ED Provider Notes (Signed)
HPI  SUBJECTIVE:  Kayla Thomas is a 80 y.o. female who presents with an erythematous, pruritic, stinging, burning rash underneath her left breast for the past 10 days.  No preceding pain or paresthesias, rash elsewhere, fevers, body aches, crusting.  No new lotions, soaps, detergents.  She has tried baby powder with cornstarch and Voltaren topical cream with improvement in her symptoms.  Symptoms worse when she got into the pool for water aerobics.  She has a past medical history of varicella and is concerned that this could be shingles.  No history of diabetes, hypertension, chronic kidney disease, shingles.  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   Status post vitrectomy March 2021 revealed resolved VM traction   Wears partial dentures     Past Surgical History:  Procedure Laterality Date   ABDOMINAL HYSTERECTOMY     ABSCESS DRAINAGE  03-04-05   insect bite   CARDIAC CATHETERIZATION N/A 10/31/2014   Procedure: Right/Left Heart Cath and Coronary Angiography;  Surgeon: Troy Sine, MD;  Location: [place] CV LAB;  Service: Cardiovascular;  Laterality: N/A;   CATARACT EXTRACTION W/PHACO  10/06/2011   Procedure: CATARACT EXTRACTION PHACO AND INTRAOCULAR LENS PLACEMENT (IOC);  Surgeon: Williams Che, MD;  Location: [facility];  Service: Ophthalmology;  Laterality: Left;  CDE:  5.76   cataracts     CHOLECYSTECTOMY  11/1999   COLONOSCOPY  05/13/2011   Procedure: COLONOSCOPY;  Surgeon: Dorothyann Peng, MD;  Location: [facility];  Service: Endoscopy;  Laterality: N/A;  8:30   COLONOSCOPY  N/A 04/09/2017   Dr. Oneida Alar: single tubular adenoma removed from the colon, redundant left colon, internal and external hemorrhoids, next colonoscopy in 10 to 15 years.   ESOPHAGOGASTRODUODENOSCOPY  01/21/06   mild antral erythema bx h-pylori/normal esophagus without evidence of mass or Barrett's/normal pylorus and duodenum   PARTIAL MASTECTOMY WITH NEEDLE LOCALIZATION AND AXILLARY SENTINEL LYMPH NODE BX Left 08/18/2012   Procedure: PARTIAL MASTECTOMY WITH NEEDLE LOCALIZATION AND AXILLARY SENTINEL LYMPH NODE BX;  Surgeon: Jamesetta So, MD;  Location: [facility];  Service: General;  Laterality: Left;  Need Frozen Section/Sentinel Node Bx @ 8:00am/Needle Loc @ 9:00am   POLYPECTOMY  04/09/2017   Procedure: POLYPECTOMY;  Surgeon: Danie Binder, MD;  Location: [facility];  Service: Endoscopy;;  Rectal   S/P Hysterectomy  1998   uterine ca   temperal/mandibular  [place] Right 05/04/2014   Procedure: RELEASE TRIGGER FINGER/A-1 PULLEY RIGHT RING FINGER;  Surgeon: Daryll Brod, MD;  Location: [place];  Service: Orthopedics;  Laterality: Right;    Family History  Problem Relation Age of Onset   Liver cancer Father    Alcohol abuse Father    Hypertension Mother    COPD Mother    Diabetes Brother    Other Son        knee and back problems    Social History   Tobacco Use   Smoking status:  Never   Smokeless tobacco: Never  Substance Use Topics   Alcohol use: No   Drug use: No    No current facility-administered medications for this encounter.  Current Outpatient Medications:    fluconazole (DIFLUCAN) 150 MG tablet, Take 1 tablet (150 mg total) by mouth once for 1 dose., Disp: 1 tablet, Rfl: 0   hydrocortisone 2.5 % lotion, Apply topically daily. For 1 week, Disp: 59 mL, Rfl: 0   miconazole (MICOTIN) 2 % cream, Apply 1 application topically 2 (two) times daily. For 2 weeks, Disp: 28.35 g, Rfl: 0   acetaminophen (TYLENOL) 325 MG  tablet, Take 650 mg by mouth every 6 (six) hours as needed (for pain.)., Disp: , Rfl:    B Complex-C (SUPER B COMPLEX PO), Take 1 capsule by mouth daily. , Disp: , Rfl:    Bilberry, Vaccinium myrtillus, (BILBERRY EXTRACT PO), Take 1 capsule by mouth daily., Disp: , Rfl:    Calcium Carb-Cholecalciferol (CALCIUM 600/VITAMIN D3) 600-800 MG-UNIT TABS, Take 1 capsule by mouth 2 (two) times daily., Disp: , Rfl:    diclofenac sodium (VOLTAREN) 1 % GEL, Apply 2-4 g topically 3 (three) times daily as needed (for pain.). , Disp: , Rfl:    diphenhydrAMINE (BENADRYL) 25 mg capsule, Take 25 mg by mouth at bedtime., Disp: , Rfl:    Glucosamine-Chondroitin (COSAMIN DS PO), Take 1 tablet by mouth daily., Disp: , Rfl:    loperamide (IMODIUM) 2 MG capsule, Take 2 mg by mouth 4 (four) times daily as needed for diarrhea or loose stools. , Disp: , Rfl:    methocarbamol (ROBAXIN) 500 MG tablet, Take 500 mg by mouth 2 (two) times daily., Disp: , Rfl:    Multiple Vitamin (MULTIVITAMIN WITH MINERALS) TABS tablet, Take 1 tablet by mouth daily., Disp: , Rfl:    Multiple Vitamins-Minerals (PRESERVISION AREDS PO), Take by mouth., Disp: , Rfl:    omeprazole (PRILOSEC) 20 MG capsule, Take 20 mg by mouth 2 (two) times daily before a meal. , Disp: , Rfl:    Turmeric 500 MG CAPS, Take 500 mg by mouth daily., Disp: , Rfl:    Zinc Sulfate (ZINC 15 PO), Take 1 tablet by mouth daily., Disp: , Rfl:   Allergies  Allergen Reactions   Aspirin Other (See Comments)    REACTION: Abdominal pain    Iron Anaphylaxis    REACTION: abdominal pain    Sulfa Antibiotics Rash    Rash all over   Sulfasalazine Rash    Rash all over   Ancef [Cefazolin] Rash     ROS  As noted in HPI.   Physical Exam  BP (!) 174/90   Pulse 82   Temp 98.3 F (36.8 C) (Oral)   Resp 16   SpO2 98%   Constitutional: Well developed, well nourished, no acute distress Eyes:  EOMI, conjunctiva normal bilaterally HENT: Normocephalic, atraumatic,mucus  membranes moist Respiratory: Normal inspiratory effort Cardiovascular: Normal rate GI: nondistended skin: Tender symmetric rash under left breast with macerated skin, scattered erythematous satellite lesions     Musculoskeletal: no deformities Neurologic: Alert & oriented x 3, no focal neuro deficits Psychiatric: Speech and behavior appropriate   ED Course   Medications - No data to display  No orders of the defined types were placed in this encounter.   No results found for this or any previous visit (from the past 24 hour(s)). No results found.  ED Clinical Impression  1. Intertrigo      ED Assessment/Plan  Concern  for yeast infection/intertrigo.  It does not appear to be shingles.  Will send home with miconazole cream twice daily for 2 weeks, hydrocortisone 2.5% daily for 1 week, Diflucan if this does not work.  Advised daily cleansing, thorough drying with a hair dryer, and aeration.  Follow-up with PMD as needed.   Discussed  MDM, treatment plan, and plan for follow-up with patient.  patient agrees with plan.   Meds ordered this encounter  Medications   miconazole (MICOTIN) 2 % cream    Sig: Apply 1 application topically 2 (two) times daily. For 2 weeks    Dispense:  28.35 g    Refill:  0   hydrocortisone 2.5 % lotion    Sig: Apply topically daily. For 1 week    Dispense:  59 mL    Refill:  0   fluconazole (DIFLUCAN) 150 MG tablet    Sig: Take 1 tablet (150 mg total) by mouth once for 1 dose.    Dispense:  1 tablet    Refill:  0      *This clinic note was created using [employer]. Therefore, there may be occasional mistakes despite careful proofreading.  ?    Melynda Ripple, MD 05/10/21 [phone]

## 2021-05-09 NOTE — Discharge Instructions (Addendum)
Try the hydrocortisone once a day for a week and then miconazole twice a day for 2 weeks first.  If you are not getting any better, then try the Diflucan.  Clean this area every day.  Dry yourself off thoroughly after bathing with a hair dryer on low.  Aerate the area is much as possible.

## 2021-05-09 NOTE — ED Triage Notes (Signed)
Rash under left breast that is itchy / uncomfortable. Rash has been present for 10 days. PT has applied baby powder, cornstarch, and voltaren cream. Is concerned about shingles. Notes that rash worsens after time in the pool (water aerobics). Rash does cross midline.

## 2021-05-21 ENCOUNTER — Other Ambulatory Visit: Payer: Self-pay

## 2021-05-21 ENCOUNTER — Encounter (INDEPENDENT_AMBULATORY_CARE_PROVIDER_SITE_OTHER): Payer: Self-pay | Admitting: Ophthalmology

## 2021-05-21 ENCOUNTER — Ambulatory Visit (INDEPENDENT_AMBULATORY_CARE_PROVIDER_SITE_OTHER): Payer: Medicare HMO | Admitting: Ophthalmology

## 2021-05-21 DIAGNOSIS — H353122 Nonexudative age-related macular degeneration, left eye, intermediate dry stage: Secondary | ICD-10-CM | POA: Diagnosis not present

## 2021-05-21 DIAGNOSIS — H43821 Vitreomacular adhesion, right eye: Secondary | ICD-10-CM

## 2021-05-21 DIAGNOSIS — H3554 Dystrophies primarily involving the retinal pigment epithelium: Secondary | ICD-10-CM | POA: Diagnosis not present

## 2021-05-21 DIAGNOSIS — H353112 Nonexudative age-related macular degeneration, right eye, intermediate dry stage: Secondary | ICD-10-CM

## 2021-05-21 NOTE — Assessment & Plan Note (Signed)
No sign of wet armd

## 2021-05-21 NOTE — Assessment & Plan Note (Signed)
No sign of CNVM and no sign of vitelliform lesion from [facility] OS

## 2021-05-21 NOTE — Progress Notes (Signed)
05/21/2021     CHIEF COMPLAINT Patient presents for  Chief Complaint  Patient presents with   Retina Follow Up      HISTORY OF PRESENT ILLNESS: Kayla Thomas is a 80 y.o. female who presents to the clinic today for:   HPI     Retina Follow Up   Patient presents with  Dry AMD.  In both eyes.  This started 6 months ago.  Duration of 6 months.  Since onset it is stable.        Comments   6 month f/u OU with OCT      Last edited by Reather Littler, COA on 05/21/2021  8:58 AM.      Referring physician: Asencion Noble, MD [address] [place],  [place] [address]  HISTORICAL INFORMATION:   Selected notes from the [facility]: No current outpatient medications on file. (Ophthalmic Drugs)   No current facility-administered medications for this visit. (Ophthalmic Drugs)   Current Outpatient Medications (Other)  Medication Sig   acetaminophen (TYLENOL) 325 MG tablet Take 650 mg by mouth every 6 (six) hours as needed (for pain.).   B Complex-C (SUPER B COMPLEX PO) Take 1 capsule by mouth daily.    Bilberry, Vaccinium myrtillus, (BILBERRY EXTRACT PO) Take 1 capsule by mouth daily.   Calcium Carb-Cholecalciferol (CALCIUM 600/VITAMIN D3) 600-800 MG-UNIT TABS Take 1 capsule by mouth 2 (two) times daily.   diclofenac sodium (VOLTAREN) 1 % GEL Apply 2-4 g topically 3 (three) times daily as needed (for pain.).    diphenhydrAMINE (BENADRYL) 25 mg capsule Take 25 mg by mouth at bedtime.   Glucosamine-Chondroitin (COSAMIN DS PO) Take 1 tablet by mouth daily.   hydrocortisone 2.5 % lotion Apply topically daily. For 1 week   loperamide (IMODIUM) 2 MG capsule Take 2 mg by mouth 4 (four) times daily as needed for diarrhea or loose stools.    methocarbamol (ROBAXIN) 500 MG tablet Take 500 mg by mouth 2 (two) times daily.   miconazole (MICOTIN) 2 % cream Apply 1 application topically 2 (two) times daily. For 2 weeks   Multiple Vitamin  (MULTIVITAMIN WITH MINERALS) TABS tablet Take 1 tablet by mouth daily.   Multiple Vitamins-Minerals (PRESERVISION AREDS PO) Take by mouth.   omeprazole (PRILOSEC) 20 MG capsule Take 20 mg by mouth 2 (two) times daily before a meal.    Turmeric 500 MG CAPS Take 500 mg by mouth daily.   Zinc Sulfate (ZINC 15 PO) Take 1 tablet by mouth daily.   No current facility-administered medications for this visit. (Other)      REVIEW OF SYSTEMS:    ALLERGIES Allergies  Allergen Reactions   Aspirin Other (See Comments)    REACTION: Abdominal pain    Iron Anaphylaxis    REACTION: abdominal pain    Sulfa Antibiotics Rash    Rash all over   Sulfasalazine Rash    Rash all over   Ancef [Cefazolin] Rash    PAST MEDICAL HISTORY Past Medical History:  Diagnosis Date   Breast cancer ([place])    Breast cancer ([place])    Chronic back pain    Gastric ulcer    History   GERD (gastroesophageal reflux disease) 01/21/2006   EGD Dr Oneida Alar mild chronic gastritis, (NO h pylori) otherwise normal   Headache(784.0)    Sinus congestion    Thumb tendonitis September 2014   left   Uterine cancer ([place]) 1998  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   Status post vitrectomy March 2021 revealed resolved VM traction   Wears partial dentures    Past Surgical History:  Procedure Laterality Date   ABDOMINAL HYSTERECTOMY     ABSCESS DRAINAGE  03-04-05   insect bite   CARDIAC CATHETERIZATION N/A 10/31/2014   Procedure: Right/Left Heart Cath and Coronary Angiography;  Surgeon: Troy Sine, MD;  Location: [facility];  Service: Cardiovascular;  Laterality: N/A;   CATARACT EXTRACTION W/PHACO  10/06/2011   Procedure: CATARACT EXTRACTION PHACO AND INTRAOCULAR LENS PLACEMENT (IOC);  Surgeon: Williams Che, MD;  Location: [facility];  Service: Ophthalmology;  Laterality: Left;  CDE:  5.76   cataracts     CHOLECYSTECTOMY  11/1999   COLONOSCOPY  05/13/2011   Procedure: COLONOSCOPY;  Surgeon: Dorothyann Peng, MD;  [facility];  Service: Endoscopy;  Laterality: N/A;  8:30   COLONOSCOPY N/A 04/09/2017   Dr. Oneida Alar: single tubular adenoma removed from the colon, redundant left colon, internal and external hemorrhoids, next colonoscopy in 10 to 15 years.   ESOPHAGOGASTRODUODENOSCOPY  01/21/06   mild antral erythema bx h-pylori/normal esophagus without evidence of mass or Barrett's/normal pylorus and duodenum   PARTIAL MASTECTOMY WITH NEEDLE LOCALIZATION AND AXILLARY SENTINEL LYMPH NODE BX Left 08/18/2012   Procedure: PARTIAL MASTECTOMY WITH NEEDLE LOCALIZATION AND AXILLARY SENTINEL LYMPH NODE BX;  Surgeon: Jamesetta So, MD;  Location: [facility];  Service: General;  Laterality: Left;  Need Frozen Section/Sentinel Node Bx @ 8:00am/Needle Loc @ 9:00am   POLYPECTOMY  04/09/2017   Procedure: POLYPECTOMY;  Surgeon: Danie Binder, MD;  [facility];  Service: Endoscopy;;  Rectal   S/P Hysterectomy  1998   uterine ca   temperal/mandibular  [place] Right 05/04/2014   Procedure: RELEASE TRIGGER FINGER/A-1 PULLEY RIGHT RING FINGER;  Surgeon: Daryll Brod, MD;  Location: [facility];  Service: Orthopedics;  Laterality: Right;    FAMILY HISTORY Family History  Problem Relation Age of Onset   Liver cancer Father    Alcohol abuse Father    Hypertension Mother    COPD Mother    Diabetes Brother    Other Son        knee and back problems    SOCIAL HISTORY Social History   Tobacco Use   Smoking status: Never   Smokeless tobacco: Never  Substance Use Topics   Alcohol use: No   Drug use: No         OPHTHALMIC EXAM:  Base Eye Exam     Visual Acuity (ETDRS)       Right Left   Dist cc 20/320ecc 20/25 -2   Dist ph cc NI     Correction: Glasses         Tonometry (Tonopen, 9:06 AM)       Right Left   Pressure 10 11         Pupils       Pupils Dark Light Shape React APD   Right PERRL 4 3 Round Brisk  None   Left PERRL 4 3 Round Brisk None         Visual Fields (Counting fingers)       Left Right    Full    Restrictions  Partial inner superior temporal, inferior temporal, superior nasal, inferior nasal deficiencies         Extraocular Movement  Right Left    Full, Ortho Full, Ortho         Neuro/Psych     Oriented x3: Yes   Mood/Affect: Normal         Dilation     Both eyes: 1.0% Mydriacyl, 2.5% Phenylephrine @ 9:04 AM           Slit Lamp and Fundus Exam     External Exam       Right Left   External Normal Normal         Slit Lamp Exam       Right Left   Lids/Lashes Normal Normal   Conjunctiva/Sclera White and quiet White and quiet   Cornea Clear Clear   Anterior Chamber Deep and quiet Deep and quiet   Iris Round and reactive Round and reactive   Lens Posterior chamber intraocular lens Posterior chamber intraocular lens   Anterior Vitreous Normal Normal         Fundus Exam       Right Left   Posterior Vitreous Vitrectomized Normal   Disc Normal Normal   C/D Ratio 0.0 0.25   Macula Yellowish pseudovitelliform type drusenoid PED in the fovea, no hemorrhage, no exudates, no change Hard drusen, no exudates, no macular thickening, Retinal pigment epithelial mottling   Vessels Normal Normal   Periphery Normal Normal            IMAGING AND PROCEDURES  Imaging and Procedures for 05/21/21  OCT, Retina - OU - Both Eyes       Right Eye Quality was good. Scan locations included subfoveal. Central Foveal Thickness: 204. Progression has improved. Findings include subretinal hyper-reflective material.   Left Eye Quality was good. Scan locations included subfoveal. Central Foveal Thickness: 253. Progression has been stable. Findings include normal foveal contour, outer retinal atrophy.   Notes Large vitelliform subfoveal lesion is now diminished, involutional OD, some 1 year post vitrectomy for VMT.,  Outer retinal atrophy,  subretinal hyper reflective material and choriocapillaris atrophy accounts for current acuity, no sign of CNVM   OS with outer retinal receptor layer disruption, no change since first visit February 2021             ASSESSMENT/PLAN:  Intermediate stage nonexudative age-related macular degeneration of right eye No sign of wet armd  Adult onset vitelliform macular dystrophy Large vitelliform lesion now involutional one year post vit for VMT OD, resolved with atrophy of the choriocapillaris layer  Vitreomacular adhesion of right eye OD condition resolved post vitrectomy and allowing for subsequent vitelliform lesion to subside, which took 1 year  Intermediate stage nonexudative age-related macular degeneration of left eye No sign of CNVM and no sign of vitelliform lesion from VMA OS     ICD-10-CM   1. Intermediate stage nonexudative age-related macular degeneration of right eye  H35.3112 OCT, Retina - OU - Both Eyes    2. Adult onset vitelliform macular dystrophy  H35.54     3. Vitreomacular adhesion of right eye  H43.821     4. Intermediate stage nonexudative age-related macular degeneration of left eye  H35.3122       1.  OS, stable overall  2.  OD, vitelliform macular dystrophy has become involutional post vitrectomy for vitreal macular traction syndrome some 1.5 year previous.,  Only in the last 6 months atrophy developed at 18 months post vitrectomy for VMT 3.  Ophthalmic Meds Ordered this visit:  No orders of the defined types were placed in this encounter.  Return in about 6 months (around 11/18/2021) for DILATE OU, OCT, COLOR FP.  There are no Patient Instructions on file for this visit.   Explained the diagnoses, plan, and follow up with the patient and they expressed understanding.  Patient expressed understanding of the importance of proper follow up care.   Clent Demark Brelan Hannen M.D. Diseases & Surgery of the Retina and Vitreous [facility]ina [facility] Diabetic [facility] 05/21/21     Abbreviations: M myopia (nearsighted); A astigmatism; H hyperopia (farsighted); P presbyopia; Mrx spectacle prescription;  CTL contact lenses; OD right eye; OS left eye; OU both eyes  XT exotropia; ET esotropia; PEK punctate epithelial keratitis; PEE punctate epithelial erosions; DES dry eye syndrome; MGD meibomian gland dysfunction; ATs artificial tears; PFAT's preservative free artificial tears; [place] nuclear sclerotic cataract; PSC posterior subcapsular cataract; ERM epi-retinal membrane; PVD posterior vitreous detachment; RD retinal detachment; DM diabetes mellitus; DR diabetic retinopathy; NPDR non-proliferative diabetic retinopathy; PDR proliferative diabetic retinopathy; CSME clinically significant macular edema; DME diabetic macular edema; dbh dot blot hemorrhages; CWS cotton wool spot; POAG primary open angle glaucoma; C/D cup-to-disc ratio; HVF humphrey visual field; GVF goldmann visual field; OCT optical coherence tomography; IOP intraocular pressure; BRVO Branch retinal vein occlusion; CRVO central retinal vein occlusion; CRAO central retinal artery occlusion; BRAO branch retinal artery occlusion; RT retinal tear; SB scleral buckle; PPV pars plana vitrectomy; VH Vitreous hemorrhage; PRP panretinal laser photocoagulation; IVK intravitreal kenalog; VMT vitreomacular traction; MH Macular hole;  NVD neovascularization of the disc; NVE neovascularization elsewhere; AREDS age related eye disease study; ARMD age related macular degeneration; POAG primary open angle glaucoma; EBMD epithelial/anterior basement membrane dystrophy; ACIOL anterior chamber intraocular lens; IOL intraocular lens; PCIOL posterior chamber intraocular lens; Phaco/IOL phacoemulsification with intraocular lens placement; [place] photorefractive keratectomy; LASIK laser assisted in situ keratomileusis; HTN hypertension; DM diabetes mellitus; COPD chronic obstructive pulmonary disease

## 2021-05-21 NOTE — Assessment & Plan Note (Addendum)
Large vitelliform lesion now involutional one year post vit for VMT OD, resolved with atrophy of the choriocapillaris layer

## 2021-05-21 NOTE — Assessment & Plan Note (Signed)
OD condition resolved post vitrectomy and allowing for subsequent vitelliform lesion to subside, which took 1 year

## 2021-09-10 ENCOUNTER — Ambulatory Visit (HOSPITAL_COMMUNITY)
Admission: RE | Admit: 2021-09-10 | Discharge: 2021-09-10 | Disposition: A | Discharge status: Home / Self Care | Payer: Medicare HMO | Source: Ambulatory Visit | Attending: Internal Medicine | Admitting: Internal Medicine

## 2021-09-10 ENCOUNTER — Encounter (HOSPITAL_COMMUNITY): Payer: Self-pay

## 2021-09-10 ENCOUNTER — Other Ambulatory Visit: Payer: Self-pay

## 2021-09-10 DIAGNOSIS — R921 Mammographic calcification found on diagnostic imaging of breast: Secondary | ICD-10-CM | POA: Diagnosis present

## 2021-11-12 ENCOUNTER — Encounter (INDEPENDENT_AMBULATORY_CARE_PROVIDER_SITE_OTHER): Payer: Medicare HMO | Admitting: Ophthalmology

## 2021-11-17 ENCOUNTER — Encounter: Payer: Self-pay | Admitting: Emergency Medicine

## 2021-11-17 ENCOUNTER — Ambulatory Visit
Admission: EM | Admit: 2021-11-17 | Discharge: 2021-11-17 | Disposition: A | Discharge status: Home / Self Care | Payer: Medicare HMO | Attending: Nurse Practitioner | Admitting: Nurse Practitioner

## 2021-11-17 ENCOUNTER — Ambulatory Visit (INDEPENDENT_AMBULATORY_CARE_PROVIDER_SITE_OTHER): Payer: Medicare HMO

## 2021-11-17 DIAGNOSIS — R509 Fever, unspecified: Secondary | ICD-10-CM | POA: Diagnosis not present

## 2021-11-17 DIAGNOSIS — R059 Cough, unspecified: Secondary | ICD-10-CM

## 2021-11-17 DIAGNOSIS — J209 Acute bronchitis, unspecified: Secondary | ICD-10-CM

## 2021-11-17 MED ORDER — FLUTICASONE PROPIONATE 50 MCG/ACT NA SUSP: 2.0000 | Freq: Every day | NASAL | 0 refills | Status: AC

## 2021-11-17 MED ORDER — AZITHROMYCIN 250 MG PO TABS: 250.0000 mg | ORAL_TABLET | Freq: Every day | ORAL | 0 refills | Status: DC

## 2021-11-17 MED ORDER — PSEUDOEPH-BROMPHEN-DM 30-2-10 MG/5ML PO SYRP: 5.0000 mL | ORAL_SOLUTION | Freq: Four times a day (QID) | ORAL | 0 refills | Status: DC | PRN

## 2021-11-17 NOTE — ED Triage Notes (Signed)
Fever since Thursday,  headache with post nasal drip.  States she been coughing a little.

## 2021-11-17 NOTE — ED Provider Notes (Signed)
[facility]    CSN: [id number] Arrival date & time: 11/17/21  0854      History   Chief Complaint No chief complaint on file.   HPI Kayla Thomas is a 81 y.o. female.   HPI The patient is an 81 year old female who presents with fever, nasal congestion, and a mild cough.  Symptoms have been present for the past 3 to 4 days.  Patient states that her fever has been as high as 103.6 and ranges around 102.  She states her last fever was this morning.  She also complains of a mild cough, and nasal congestion.  She denies ear pain, sore throat, abdominal pain, nausea, vomiting, or diarrhea.  Patient has been taking ibuprofen, Tylenol, DayQuil/NyQuil for her symptoms.  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   Status post vitrectomy March 2021 revealed resolved VM traction   Wears partial dentures     Patient Active Problem List   Diagnosis Date Noted   Intermediate stage nonexudative age-related macular degeneration of left eye 05/21/2021   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   Dry eyes, bilateral 05/03/2020   Follow-up examination after eye surgery 10/19/2019   Intermediate stage nonexudative age-related macular degeneration of right eye 10/19/2019   Polyp of rectum    Vulval lesion benign, excised 12/22/2016   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   Closed nondisplaced fracture of distal phalanx of right middle finger 02/27/2016   Breast cancer ([place]) 04/30/2015   Abnormal ECG    DOE (dyspnea on exertion) 04/12/2014   MIGRAINE HEADACHE 01/23/2009   GERD  01/23/2009   GASTRITIS 01/23/2009   LOOSE STOOLS 01/23/2009   ALLERGY 01/23/2009   UTERINE CANCER, HX OF 01/23/2009    Past Surgical History:  Procedure Laterality Date   ABDOMINAL HYSTERECTOMY     ABSCESS DRAINAGE  03-04-05   insect bite   CARDIAC CATHETERIZATION N/A 10/31/2014   Procedure: Right/Left Heart Cath and Coronary Angiography;  Surgeon: Troy Sine, MD;  Location: [facility];  Service: Cardiovascular;  Laterality: N/A;   CATARACT EXTRACTION W/PHACO  10/06/2011   Procedure: CATARACT EXTRACTION PHACO AND INTRAOCULAR LENS PLACEMENT (IOC);  Surgeon: Williams Che, MD;  Location: [facility];  Service: Ophthalmology;  Laterality: Left;  CDE:  5.76   cataracts     CHOLECYSTECTOMY  11/1999   COLONOSCOPY  05/13/2011   Procedure: COLONOSCOPY;  Surgeon: Dorothyann Peng, MD;  [facility];  Service: Endoscopy;  Laterality: N/A;  8:30   COLONOSCOPY N/A 04/09/2017   Dr. Oneida Alar: single tubular adenoma removed from the colon, redundant left colon, internal and external hemorrhoids, next colonoscopy in 10 to 15 years.   ESOPHAGOGASTRODUODENOSCOPY  01/21/06   mild antral erythema bx h-pylori/normal esophagus without evidence of mass or Barrett's/normal pylorus and duodenum   PARTIAL MASTECTOMY WITH NEEDLE LOCALIZATION AND AXILLARY SENTINEL LYMPH NODE BX Left 08/18/2012   Procedure: PARTIAL MASTECTOMY WITH NEEDLE LOCALIZATION AND AXILLARY SENTINEL LYMPH NODE BX;  Surgeon: Jamesetta So, MD;  Location: [facility];  Service: General;  Laterality: Left;  Need Frozen Section/Sentinel Node Bx @ 8:00am/Needle Loc @ 9:00am   POLYPECTOMY  04/09/2017   Procedure: POLYPECTOMY;  Surgeon: Danie Binder, MD;  [facility];  Service: Endoscopy;;  Rectal   S/P Hysterectomy  1998   uterine ca   temperal/mandibular  [place] Right 05/04/2014   Procedure: RELEASE TRIGGER FINGER/A-1 PULLEY RIGHT RING FINGER;  Surgeon: Daryll Brod, MD;  Location:  [facility];  Service: Orthopedics;  Laterality: Right;    OB History     Gravida  0   Para  0   Term  0   Preterm  0   AB  0   Living  0      SAB  0   IAB  0   Ectopic  0   Multiple  0   Live Births  0            Home Medications    Prior to Admission medications   Medication Sig Start Date End Date Taking? Authorizing Provider  azithromycin (ZITHROMAX) 250 MG tablet Take 1 tablet (250 mg total) by mouth daily. Take first 2 tablets together, then 1 every day until finished. 11/17/21  Yes Lis Savitt-Warren, Alda Lea, NP  brompheniramine-pseudoephedrine-DM 30-2-10 MG/5ML syrup Take 5 mLs by mouth 4 (four) times daily as needed. 11/17/21  Yes Trenten Watchman-Warren, Alda Lea, NP  fluticasone (FLONASE) 50 MCG/ACT nasal spray Place 2 sprays into both nostrils daily. 11/17/21  Yes Bladimir Auman-Warren, Alda Lea, NP  acetaminophen (TYLENOL) 325 MG tablet Take 650 mg by mouth every 6 (six) hours as needed (for pain.).    [provider]  B Complex-C (SUPER B COMPLEX PO) Take 1 capsule by mouth daily.     [provider]  Bilberry, Vaccinium myrtillus, (BILBERRY EXTRACT PO) Take 1 capsule by mouth daily.    [provider]  Calcium Carb-Cholecalciferol (CALCIUM 600/VITAMIN D3) 600-800 MG-UNIT TABS Take 1 capsule by mouth 2 (two) times daily.    [provider]  diclofenac sodium (VOLTAREN) 1 % GEL Apply 2-4 g topically 3 (three) times daily as needed (for pain.).  01/19/15   [provider]  diphenhydrAMINE (BENADRYL) 25 mg capsule Take 25 mg by mouth at bedtime.    [provider]  Glucosamine-Chondroitin (COSAMIN DS PO) Take 1 tablet by mouth daily.    [provider]  hydrocortisone 2.5 % lotion Apply topically daily. For 1 week 05/09/21   Melynda Ripple, MD  loperamide (IMODIUM) 2 MG capsule Take 2 mg by mouth 4 (four) times daily as needed for diarrhea or loose stools.     [provider]   methocarbamol (ROBAXIN) 500 MG tablet Take 500 mg by mouth 2 (two) times daily. 11/12/20   [provider]  miconazole (MICOTIN) 2 % cream Apply 1 application topically 2 (two) times daily. For 2 weeks 05/09/21   Melynda Ripple, MD  Multiple Vitamin (MULTIVITAMIN WITH MINERALS) TABS tablet Take 1 tablet by mouth daily.    [provider]  Multiple Vitamins-Minerals (PRESERVISION AREDS PO) Take by mouth.    [provider]  omeprazole (PRILOSEC) 20 MG capsule Take 20 mg by mouth 2 (two) times daily before a meal.  04/22/11   Andria Meuse, NP  Turmeric 500 MG CAPS Take 500 mg by mouth daily.    [provider]  Zinc Sulfate (ZINC 15 PO) Take 1 tablet  by mouth daily.    [provider]    Family History Family History  Problem Relation Age of Onset   Liver cancer Father    Alcohol abuse Father    Hypertension Mother    COPD Mother    Diabetes Brother    Other Son        knee and back problems    Social History Social History   Tobacco Use   Smoking status: Never   Smokeless tobacco: Never  Substance Use Topics   Alcohol use: No   Drug use: No     Allergies   Aspirin, Iron, Sulfa antibiotics, Sulfasalazine, and Ancef [cefazolin]   Review of Systems Review of Systems PER HPI  Physical Exam Triage Vital Signs ED Triage Vitals  Enc Vitals Group     BP 11/17/21 0915 136/80     Pulse Rate 11/17/21 0915 97     Resp 11/17/21 0915 18     Temp 11/17/21 0915 99.9 F (37.7 C)     Temp Source 11/17/21 0915 Oral     SpO2 11/17/21 0915 93 %     Weight --      Height --      Head Circumference --      Peak Flow --      Pain Score 11/17/21 0917 0     Pain Loc --      Pain Edu? --      Excl. in [place]? --    No data found.  Updated Vital Signs BP 136/80 (BP Location: Right Arm)   Pulse 97   Temp 99.9 F (37.7 C) (Oral)   Resp 18   SpO2 93%   Visual Acuity Right Eye Distance:   Left Eye Distance:   Bilateral  Distance:    Right Eye Near:   Left Eye Near:    Bilateral Near:     Physical Exam Vitals and nursing note reviewed.  Constitutional:      General: She is not in acute distress.    Appearance: She is well-developed.  HENT:     Head: Normocephalic.     Right Ear: Tympanic membrane, ear canal and external ear normal.     Left Ear: Tympanic membrane, ear canal and external ear normal.     Nose: Congestion present.     Right Turbinates: Enlarged and swollen.     Left Turbinates: Enlarged and swollen.     Right Sinus: No maxillary sinus tenderness or frontal sinus tenderness.     Left Sinus: No maxillary sinus tenderness or frontal sinus tenderness.  Eyes:     Extraocular Movements: Extraocular movements intact.     Conjunctiva/sclera: Conjunctivae normal.     Pupils: Pupils are equal, round, and reactive to light.  Cardiovascular:     Rate and Rhythm: Normal rate and regular rhythm.     Heart sounds: Normal heart sounds.  Pulmonary:     Effort: Pulmonary effort is normal.     Breath sounds: Normal breath sounds.  Abdominal:     General: Bowel sounds are normal. There is no distension.     Palpations: Abdomen is soft.     Tenderness: There is no abdominal tenderness. There is no guarding or rebound.  Genitourinary:    Vagina: Normal. No vaginal discharge.  Musculoskeletal:     Cervical back: Normal range of motion.  Skin:    General: Skin is warm and dry.     Findings: No erythema or rash.  Neurological:     Mental Status: She is alert and oriented to person, place, and time.     Cranial Nerves: No cranial nerve deficit.  Psychiatric:        Behavior: Behavior normal.     UC Treatments / Results  Labs (all labs ordered are listed, but only abnormal results are displayed) Labs Reviewed  COVID-19, FLU A+B NAA  CBC WITH DIFFERENTIAL/PLATELET    EKG   Radiology DG Chest 2 View  Result Date: 11/17/2021 CLINICAL DATA:  Cough and fever for the past 5 days. EXAM:  CHEST - 2 VIEW COMPARISON:  08/13/2012 FINDINGS: Borderline enlarged cardiac silhouette. Clear lungs. Moderate peribronchial thickening. Thoracic spine degenerative changes. IMPRESSION: Moderate bronchitic changes.  No evidence of pneumonia. Electronically Signed   By: Claudie Revering M.D.   On: 11/17/2021 10:00    Procedures Procedures (including critical care time)  Medications Ordered in UC Medications - No data to display  Initial Impression / Assessment and Plan / UC Course  I have reviewed the triage vital signs and the nursing notes.  Pertinent labs & imaging results that were available during my care of the patient were reviewed by me and considered in my medical decision making (see chart for details).  Patient is an 81 year old female who presents with fever for the past 2 to 3 days.  She also complains of nasal congestion.  Fever has been intermittent.  She is in no acute distress, her vital signs are stable, and her exam is reassuring.  She has been taking over-the-counter medication for her symptoms.  On exam, her lung sounds are clear.  A chest x-ray was performed which was negative for pneumonia, but did show bronchitis.  A CBC was also collected along with a COVID/influenza test.  We will treat patient with azithromycin for coverage.  Symptomatic treatment was also provided for her nasal congestion and cough.  Supportive care was recommended.  Patient was given instructions to follow-up with her primary care physician if her symptoms do not improve. Final Clinical Impressions(s) / UC Diagnoses   Final diagnoses:  Fever, unspecified  Acute bronchitis, unspecified organism     Discharge Instructions      Your x-ray is suggestive of bronchitis. I am going to start you on a antibiotic for coverage for your symptoms.  I am also providing a nasal spray to help with your nasal congestion. May continue Tylenol as needed for pain or fever. Increase fluids and allow for plenty of  rest. You can also use a normal saline nasal spray throughout the day to help with nasal congestion. If your symptoms worsen or do not improve after taking the medication, please follow-up with your primary care physician.     ED Prescriptions     Medication Sig Dispense Auth. Provider   brompheniramine-pseudoephedrine-DM 30-2-10 MG/5ML syrup Take 5 mLs by mouth 4 (four) times daily as needed. 140 mL Senia Even-Warren, Alda Lea, NP   fluticasone (FLONASE) 50 MCG/ACT nasal spray Place 2 sprays into both nostrils daily. 16 g Tobi Leinweber-Warren, Alda Lea, NP   azithromycin (ZITHROMAX) 250 MG tablet Take 1 tablet (250 mg total) by mouth daily. Take first 2 tablets together, then 1 every day until finished. 6 tablet Nakhi Choi-Warren, Alda Lea, NP      PDMP not reviewed this encounter.   Tish Men, NP 11/17/21 1018

## 2021-11-17 NOTE — Discharge Instructions (Addendum)
Your x-ray is suggestive of bronchitis. I am going to start you on a antibiotic for coverage for your symptoms.  I am also providing a nasal spray to help with your nasal congestion. May continue Tylenol as needed for pain or fever. Increase fluids and allow for plenty of rest. You can also use a normal saline nasal spray throughout the day to help with nasal congestion. If your symptoms worsen or do not improve after taking the medication, please follow-up with your primary care physician.

## 2021-11-19 ENCOUNTER — Encounter (INDEPENDENT_AMBULATORY_CARE_PROVIDER_SITE_OTHER): Payer: Medicare HMO | Admitting: Ophthalmology

## 2021-11-19 LAB — COVID-19, FLU A+B NAA
Influenza A, NAA: NOT DETECTED
Influenza B, NAA: NOT DETECTED
SARS-CoV-2, NAA: NOT DETECTED

## 2021-11-19 LAB — CBC WITH DIFFERENTIAL/PLATELET
Basophils Absolute: 0 10*3/uL (ref 0.0–0.2)
Basos: 1 %
EOS (ABSOLUTE): 0 10*3/uL (ref 0.0–0.4)
Eos: 0 %
Hematocrit: 41 % (ref 34.0–46.6)
Hemoglobin: 14.6 g/dL (ref 11.1–15.9)
Immature Grans (Abs): 0 10*3/uL (ref 0.0–0.1)
Immature Granulocytes: 2 %
Lymphocytes Absolute: 0.5 10*3/uL — ABNORMAL LOW (ref 0.7–3.1)
Lymphs: 19 %
MCH: 31.5 pg (ref 26.6–33.0)
MCHC: 35.6 g/dL (ref 31.5–35.7)
MCV: 88 fL (ref 79–97)
Monocytes Absolute: 0.3 10*3/uL (ref 0.1–0.9)
Monocytes: 11 %
Neutrophils Absolute: 1.7 10*3/uL (ref 1.4–7.0)
Neutrophils: 67 %
RBC: 4.64 x10E6/uL (ref 3.77–5.28)
RDW: 12 % (ref 11.7–15.4)
WBC: 2.5 10*3/uL — CL (ref 3.4–10.8)

## 2021-12-05 ENCOUNTER — Ambulatory Visit (INDEPENDENT_AMBULATORY_CARE_PROVIDER_SITE_OTHER): Payer: Medicare HMO | Admitting: Ophthalmology

## 2021-12-05 ENCOUNTER — Encounter (INDEPENDENT_AMBULATORY_CARE_PROVIDER_SITE_OTHER): Payer: Self-pay | Admitting: Ophthalmology

## 2021-12-05 DIAGNOSIS — H43821 Vitreomacular adhesion, right eye: Secondary | ICD-10-CM | POA: Diagnosis not present

## 2021-12-05 DIAGNOSIS — H353112 Nonexudative age-related macular degeneration, right eye, intermediate dry stage: Secondary | ICD-10-CM | POA: Diagnosis not present

## 2021-12-05 DIAGNOSIS — Z9889 Other specified postprocedural states: Secondary | ICD-10-CM | POA: Insufficient documentation

## 2021-12-05 DIAGNOSIS — H353114 Nonexudative age-related macular degeneration, right eye, advanced atrophic with subfoveal involvement: Secondary | ICD-10-CM

## 2021-12-05 DIAGNOSIS — H3554 Dystrophies primarily involving the retinal pigment epithelium: Secondary | ICD-10-CM | POA: Diagnosis not present

## 2021-12-05 DIAGNOSIS — H353122 Nonexudative age-related macular degeneration, left eye, intermediate dry stage: Secondary | ICD-10-CM

## 2021-12-05 NOTE — Assessment & Plan Note (Signed)
Center involved RPE atrophy accounts for acuity, no sign of CNVM.  Likely candidate for Syfovre in the future

## 2021-12-05 NOTE — Assessment & Plan Note (Signed)
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

## 2021-12-05 NOTE — Progress Notes (Signed)
12/05/2021     CHIEF COMPLAINT Patient presents for  Chief Complaint  Patient presents with   Macular Degeneration      HISTORY OF PRESENT ILLNESS: Kayla Thomas is a 81 y.o. female who presents to the clinic today for:   HPI   6 mos du Dilate OU, [place] FP. Patient states vision is stable and unchanged since last visit. Denies any new floaters or FOL. Denies hospitalizations or surgeries since last visit. Patient takes PreserVision AREDS one time daily. Last edited by Laurin Coder on 12/05/2021  8:35 AM.      Referring physician: Clent Jacks, MD [address] [place],  [place] [address]  HISTORICAL INFORMATION:   Selected notes from the MEDICAL RECORD NUMBER       CURRENT MEDICATIONS: No current outpatient medications on file. (Ophthalmic Drugs)   No current facility-administered medications for this visit. (Ophthalmic Drugs)   Current Outpatient Medications (Other)  Medication Sig   acetaminophen (TYLENOL) 325 MG tablet Take 650 mg by mouth every 6 (six) hours as needed (for pain.).   azithromycin (ZITHROMAX) 250 MG tablet Take 1 tablet (250 mg total) by mouth daily. Take first 2 tablets together, then 1 every day until finished.   B Complex-C (SUPER B COMPLEX PO) Take 1 capsule by mouth daily.    Bilberry, Vaccinium myrtillus, (BILBERRY EXTRACT PO) Take 1 capsule by mouth daily.   brompheniramine-pseudoephedrine-DM 30-2-10 MG/5ML syrup Take 5 mLs by mouth 4 (four) times daily as needed.   Calcium Carb-Cholecalciferol (CALCIUM 600/VITAMIN D3) 600-800 MG-UNIT TABS Take 1 capsule by mouth 2 (two) times daily.   diclofenac sodium (VOLTAREN) 1 % GEL Apply 2-4 g topically 3 (three) times daily as needed (for pain.).    diphenhydrAMINE (BENADRYL) 25 mg capsule Take 25 mg by mouth at bedtime.   fluticasone (FLONASE) 50 MCG/ACT nasal spray Place 2 sprays into both nostrils daily.   Glucosamine-Chondroitin (COSAMIN DS PO) Take 1 tablet by mouth daily.    hydrocortisone 2.5 % lotion Apply topically daily. For 1 week   loperamide (IMODIUM) 2 MG capsule Take 2 mg by mouth 4 (four) times daily as needed for diarrhea or loose stools.    methocarbamol (ROBAXIN) 500 MG tablet Take 500 mg by mouth 2 (two) times daily.   miconazole (MICOTIN) 2 % cream Apply 1 application topically 2 (two) times daily. For 2 weeks   Multiple Vitamin (MULTIVITAMIN WITH MINERALS) TABS tablet Take 1 tablet by mouth daily.   Multiple Vitamins-Minerals (PRESERVISION AREDS PO) Take by mouth.   omeprazole (PRILOSEC) 20 MG capsule Take 20 mg by mouth 2 (two) times daily before a meal.    Turmeric 500 MG CAPS Take 500 mg by mouth daily.   Zinc Sulfate (ZINC 15 PO) Take 1 tablet by mouth daily.   No current facility-administered medications for this visit. (Other)      REVIEW OF SYSTEMS: ROS   Negative for: Constitutional, Gastrointestinal, Neurological, Skin, Genitourinary, Musculoskeletal, HENT, Endocrine, Cardiovascular, Eyes, Respiratory, Psychiatric (.ak), Allergic/Imm, Heme/Lymph Last edited by Laurin Coder on 12/05/2021  8:35 AM.       ALLERGIES Allergies  Allergen Reactions   Aspirin Other (See Comments)    REACTION: Abdominal pain    Iron Anaphylaxis    REACTION: abdominal pain    Sulfa Antibiotics Rash    Rash all over   Sulfasalazine Rash    Rash all over   Ancef [Cefazolin] Rash    PAST MEDICAL HISTORY Past  Medical History:  Diagnosis Date   Breast cancer ([place])    Breast cancer ([place])    Chronic back pain    Gastric ulcer    History   GERD (gastroesophageal reflux disease) 01/21/2006   EGD Dr Oneida Alar mild chronic gastritis, (NO h pylori) otherwise normal   Headache(784.0)    Sinus congestion    Thumb tendonitis September 2014   left   Uterine cancer ([place]) 1998   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   Status post vitrectomy March 2021 revealed resolved VM traction   Wears partial dentures    Past Surgical History:   Procedure Laterality Date   ABDOMINAL HYSTERECTOMY     ABSCESS DRAINAGE  03-04-05   insect bite   CARDIAC CATHETERIZATION N/A 10/31/2014   Procedure: Right/Left Heart Cath and Coronary Angiography;  Surgeon: Troy Sine, MD;  Location: [facility];  Service: Cardiovascular;  Laterality: N/A;   CATARACT EXTRACTION W/PHACO  10/06/2011   Procedure: CATARACT EXTRACTION PHACO AND INTRAOCULAR LENS PLACEMENT (IOC);  Surgeon: Williams Che, MD;  Location: [facility];  Service: Ophthalmology;  Laterality: Left;  CDE:  5.76   cataracts     CHOLECYSTECTOMY  11/1999   COLONOSCOPY  05/13/2011   Procedure: COLONOSCOPY;  Surgeon: Dorothyann Peng, MD;  Location: [facility];  Service: Endoscopy;  Laterality: N/A;  8:30   COLONOSCOPY N/A 04/09/2017   Dr. Oneida Alar: single tubular adenoma removed from the colon, redundant left colon, internal and external hemorrhoids, next colonoscopy in 10 to 15 years.   ESOPHAGOGASTRODUODENOSCOPY  01/21/06   mild antral erythema bx h-pylori/normal esophagus without evidence of mass or Barrett's/normal pylorus and duodenum   PARTIAL MASTECTOMY WITH NEEDLE LOCALIZATION AND AXILLARY SENTINEL LYMPH NODE BX Left 08/18/2012   Procedure: PARTIAL MASTECTOMY WITH NEEDLE LOCALIZATION AND AXILLARY SENTINEL LYMPH NODE BX;  Surgeon: Jamesetta So, MD;  Location: [facility];  Service: General;  Laterality: Left;  Need Frozen Section/Sentinel Node Bx @ 8:00am/Needle Loc @ 9:00am   POLYPECTOMY  04/09/2017   Procedure: POLYPECTOMY;  Surgeon: Danie Binder, MD;  Location: [facility];  Service: Endoscopy;;  Rectal   S/P Hysterectomy  1998   uterine ca   temperal/mandibular  [place] Right 05/04/2014   Procedure: RELEASE TRIGGER FINGER/A-1 PULLEY RIGHT RING FINGER;  Surgeon: Daryll Brod, MD;  Location: [facility];  Service: Orthopedics;  Laterality: Right;    FAMILY HISTORY Family History  Problem Relation Age of Onset   Liver  cancer Father    Alcohol abuse Father    Hypertension Mother    COPD Mother    Diabetes Brother    Other Son        knee and back problems    SOCIAL HISTORY Social History   Tobacco Use   Smoking status: Never   Smokeless tobacco: Never  Substance Use Topics   Alcohol use: No   Drug use: No         OPHTHALMIC EXAM:  Base Eye Exam     Visual Acuity (ETDRS)       Right Left   Dist cc 20/400 20/30 -2   Dist ph cc NI 20/25 -1    Correction: Glasses         Tonometry (Tonopen, 8:39 AM)       Right Left   Pressure 13 13         Pupils  Dark Light APD   Right 4 3 None   Left 4 3 None         Extraocular Movement       Right Left    Full Full         Neuro/Psych     Oriented x3: Yes   Mood/Affect: Normal         Dilation     Both eyes: 1.0% Mydriacyl, 2.5% Phenylephrine @ 8:39 AM           Slit Lamp and Fundus Exam     External Exam       Right Left   External Normal Normal         Slit Lamp Exam       Right Left   Lids/Lashes Normal Normal   Conjunctiva/Sclera White and quiet White and quiet   Cornea Clear Clear   Anterior Chamber Deep and quiet Deep and quiet   Iris Round and reactive Round and reactive   Lens Posterior chamber intraocular lens Posterior chamber intraocular lens   Anterior Vitreous Normal Normal         Fundus Exam       Right Left   Posterior Vitreous Vitrectomized Normal   Disc Normal Normal   C/D Ratio 0.0 0.25   Macula Yellowish pseudovitelliform type drusenoid PED in the fovea, no hemorrhage, no exudates, no change Hard drusen, no exudates, no macular thickening, Retinal pigment epithelial mottling   Vessels Normal Normal   Periphery Normal Normal            IMAGING AND PROCEDURES  Imaging and Procedures for 12/05/21  Color Fundus Photography Optos - OU - Both Eyes       Right Eye Progression has improved. Disc findings include normal observations. Macula : geographic  atrophy, retinal pigment epithelium abnormalities. Vessels : normal observations. Periphery : normal observations.   Left Eye Progression has been stable. Macula : retinal pigment epithelium abnormalities. Vessels : normal observations. Periphery : normal observations.   Notes OD, 1 disc area center involved RPE atrophy from region of previously large subfoveal drusenoid deposit which developed as a result of vitreal foveal elevation from VMT OD.      OCT, Retina - OU - Both Eyes       Right Eye Quality was good. Scan locations included subfoveal. Central Foveal Thickness: 195. Progression has improved. Findings include subretinal hyper-reflective material.   Left Eye Quality was good. Scan locations included subfoveal. Central Foveal Thickness: 249. Progression has been stable. Findings include normal foveal contour, outer retinal atrophy.   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  Likely candidate for Syfovre in the future  Intermediate stage nonexudative age-related macular degeneration of left eye Mild OS good acuity     ICD-10-CM   1. Intermediate stage nonexudative  age-related macular degeneration of right eye  H35.3112 Color Fundus Photography  Optos - OU - Both Eyes    OCT, Retina - OU - Both Eyes    2. Vitreomacular adhesion of right eye  H43.821     3. Adult onset vitelliform macular dystrophy  H35.54 Color Fundus Photography Optos - OU - Both Eyes    4. History of vitrectomy  Z98.890     5. Advanced nonexudative age-related macular degeneration of right eye with subfoveal involvement  H35.3114     6. Intermediate stage nonexudative age-related macular degeneration of left eye  H35.3122       1.  OU, with dry AMD.  OD with loss of vision due to RPE atrophy from previous large subfoveal drusenoid deposit.  Involutional changes, might be a candidate for new medication Syfovre in the near future.  2.  No sign of CNVM OU  3.  Ophthalmic Meds Ordered this visit:  No orders of the defined types were placed in this encounter.      Return in about 6 months (around 06/06/2022) for DILATE OU, [place] FP, OCT.  There are no Patient Instructions on file for this visit.   Explained the diagnoses, plan, and follow up with the patient and they expressed understanding.  Patient expressed understanding of the importance of proper follow up care.   Clent Demark Reynol Arnone M.D. Diseases & Surgery of the Retina and Vitreous [facility] 12/05/21     Abbreviations: M myopia (nearsighted); A astigmatism; H hyperopia (farsighted); P presbyopia; Mrx spectacle prescription;  CTL contact lenses; OD right eye; OS left eye; OU both eyes  XT exotropia; ET esotropia; PEK punctate epithelial keratitis; PEE punctate epithelial erosions; DES dry eye syndrome; MGD meibomian gland dysfunction; ATs artificial tears; PFAT's preservative free artificial tears; [place] nuclear sclerotic cataract; PSC posterior subcapsular cataract; ERM epi-retinal membrane; PVD posterior vitreous detachment; RD retinal detachment; DM diabetes mellitus; DR diabetic retinopathy; NPDR non-proliferative diabetic retinopathy; PDR proliferative diabetic retinopathy; CSME  clinically significant macular edema; DME diabetic macular edema; dbh dot blot hemorrhages; CWS cotton wool spot; POAG primary open angle glaucoma; C/D cup-to-disc ratio; HVF humphrey visual field; GVF goldmann visual field; OCT optical coherence tomography; IOP intraocular pressure; BRVO Branch retinal vein occlusion; CRVO central retinal vein occlusion; CRAO central retinal artery occlusion; BRAO branch retinal artery occlusion; RT retinal tear; SB scleral buckle; PPV pars plana vitrectomy; VH Vitreous hemorrhage; PRP panretinal laser photocoagulation; IVK intravitreal kenalog; VMT vitreomacular traction; MH Macular hole;  NVD neovascularization of the disc; NVE neovascularization elsewhere; AREDS age related eye disease study; ARMD age related macular degeneration; POAG primary open angle glaucoma; EBMD epithelial/anterior basement membrane dystrophy; ACIOL anterior chamber intraocular lens; IOL intraocular lens; PCIOL posterior chamber intraocular lens; Phaco/IOL phacoemulsification with intraocular lens placement; [place] photorefractive keratectomy; LASIK laser assisted in situ keratomileusis; HTN hypertension; DM diabetes mellitus; COPD chronic obstructive pulmonary disease

## 2021-12-05 NOTE — Assessment & Plan Note (Signed)
Mild OS good acuity

## 2021-12-05 NOTE — Assessment & Plan Note (Signed)
OD condition resolved post vitrectomy for VMT

## 2022-01-13 ENCOUNTER — Other Ambulatory Visit (HOSPITAL_COMMUNITY): Payer: Self-pay | Admitting: Internal Medicine

## 2022-01-13 ENCOUNTER — Other Ambulatory Visit: Payer: Self-pay | Admitting: Internal Medicine

## 2022-01-13 DIAGNOSIS — R2681 Unsteadiness on feet: Secondary | ICD-10-CM

## 2022-01-13 DIAGNOSIS — R260 Ataxic gait: Secondary | ICD-10-CM

## 2022-01-16 ENCOUNTER — Ambulatory Visit
Admission: RE | Admit: 2022-01-16 | Discharge: 2022-01-16 | Disposition: A | Discharge status: Home / Self Care | Payer: Medicare HMO | Source: Ambulatory Visit | Attending: Internal Medicine | Admitting: Internal Medicine

## 2022-01-16 DIAGNOSIS — R2681 Unsteadiness on feet: Secondary | ICD-10-CM | POA: Insufficient documentation

## 2022-01-16 DIAGNOSIS — R260 Ataxic gait: Secondary | ICD-10-CM | POA: Diagnosis present

## 2022-01-28 ENCOUNTER — Other Ambulatory Visit (HOSPITAL_COMMUNITY): Payer: Medicare HMO

## 2022-02-06 ENCOUNTER — Other Ambulatory Visit (HOSPITAL_COMMUNITY): Payer: Self-pay | Admitting: Hematology

## 2022-02-06 DIAGNOSIS — R921 Mammographic calcification found on diagnostic imaging of breast: Secondary | ICD-10-CM

## 2022-02-19 ENCOUNTER — Ambulatory Visit (HOSPITAL_COMMUNITY)
Admission: RE | Admit: 2022-02-19 | Discharge: 2022-02-19 | Disposition: A | Discharge status: Home / Self Care | Payer: Medicare HMO | Source: Ambulatory Visit | Attending: Hematology | Admitting: Hematology

## 2022-02-19 DIAGNOSIS — R921 Mammographic calcification found on diagnostic imaging of breast: Secondary | ICD-10-CM | POA: Insufficient documentation

## 2022-02-20 ENCOUNTER — Other Ambulatory Visit: Payer: Self-pay

## 2022-02-20 ENCOUNTER — Emergency Department (HOSPITAL_COMMUNITY)
Admission: EM | Admit: 2022-02-20 | Discharge: 2022-02-20 | Disposition: A | Discharge status: Home / Self Care | Payer: Medicare HMO | Attending: Emergency Medicine | Admitting: Emergency Medicine

## 2022-02-20 ENCOUNTER — Encounter (HOSPITAL_COMMUNITY): Payer: Self-pay

## 2022-02-20 DIAGNOSIS — Z8541 Personal history of malignant neoplasm of cervix uteri: Secondary | ICD-10-CM | POA: Insufficient documentation

## 2022-02-20 DIAGNOSIS — I160 Hypertensive urgency: Secondary | ICD-10-CM | POA: Insufficient documentation

## 2022-02-20 DIAGNOSIS — Z853 Personal history of malignant neoplasm of breast: Secondary | ICD-10-CM | POA: Insufficient documentation

## 2022-02-20 DIAGNOSIS — Z79899 Other long term (current) drug therapy: Secondary | ICD-10-CM | POA: Diagnosis not present

## 2022-02-20 DIAGNOSIS — R42 Dizziness and giddiness: Secondary | ICD-10-CM | POA: Diagnosis present

## 2022-02-20 LAB — BASIC METABOLIC PANEL
Anion gap: 7 (ref 5–15)
BUN: 14 mg/dL (ref 8–23)
CO2: 25 mmol/L (ref 22–32)
Calcium: 9 mg/dL (ref 8.9–10.3)
Chloride: 106 mmol/L (ref 98–111)
Creatinine, Ser: 0.74 mg/dL (ref 0.44–1.00)
GFR, Estimated: >60 mL/min (ref >60)
Glucose, Bld: 192 mg/dL — ABNORMAL HIGH (ref 70–99)
Potassium: 4.1 mmol/L (ref 3.5–5.1)
Sodium: 138 mmol/L (ref 135–145)

## 2022-02-20 LAB — URINALYSIS, ROUTINE W REFLEX MICROSCOPIC
Bilirubin Urine: NEGATIVE
Glucose, UA: NEGATIVE mg/dL
Hgb urine dipstick: NEGATIVE
Ketones, ur: NEGATIVE mg/dL
Leukocytes,Ua: NEGATIVE
Nitrite: NEGATIVE
Protein, ur: NEGATIVE mg/dL
Specific Gravity, Urine: 1.006 (ref 1.005–1.030)
pH: 6 (ref 5.0–8.0)

## 2022-02-20 LAB — CBC
HCT: 42.7 % (ref 36.0–46.0)
Hemoglobin: 14.7 g/dL (ref 12.0–15.0)
MCH: 32.1 pg (ref 26.0–34.0)
MCHC: 34.4 g/dL (ref 30.0–36.0)
MCV: 93.2 fL (ref 80.0–100.0)
Platelets: 222 10*3/uL (ref 150–400)
RBC: 4.58 MIL/uL (ref 3.87–5.11)
RDW: 12.4 % (ref 11.5–15.5)
WBC: 8.6 10*3/uL (ref 4.0–10.5)
nRBC: 0 % (ref 0.0–0.2)

## 2022-02-20 LAB — TROPONIN I (HIGH SENSITIVITY)
Troponin I (High Sensitivity): 3 ng/L (ref <18)
Troponin I (High Sensitivity): 5 ng/L (ref <18)

## 2022-02-20 LAB — CBG MONITORING, ED: Glucose-Capillary: 189 mg/dL — ABNORMAL HIGH (ref 70–99)

## 2022-02-20 MED ORDER — MECLIZINE HCL 12.5 MG PO TABS
25.0000 mg | ORAL_TABLET | Freq: Once | ORAL | Status: AC
Start: 2022-02-20 — End: 2022-02-20
  Administered 2022-02-20: 25 mg via ORAL
  Filled 2022-02-20: qty 2

## 2022-02-20 MED ORDER — SODIUM CHLORIDE 0.9 % IV BOLUS
1000.0000 mL | Freq: Once | INTRAVENOUS | Status: AC
  Administered 2022-02-20: 1000 mL via INTRAVENOUS

## 2022-02-20 NOTE — ED Triage Notes (Addendum)
Pt presents to ED via [facility] for dizziness with movement, started 5 days ago. Seen PCP, diagnoses with Vertigo. CBG 164. BP 179/77. Pt states this has been ongoing for weeks. She has had MRI which was normal.

## 2022-02-20 NOTE — ED Notes (Signed)
Pt assisted to bathroom and back to bed via wheelchair 

## 2022-02-20 NOTE — ED Provider Notes (Signed)
[facility] EMERGENCY DEPARTMENT Provider Note   CSN: [id number] Arrival date & time: 02/20/22  1232     History  Chief Complaint  Patient presents with   Dizziness    Kayla Thomas is a 81 y.o. female with past medical history significant for migraines, dyspnea on exertion, history of uterine cancer and breast cancer that she is not currently undergoing treatment for, history of gastric ulcers, with recent history of orthostatic hypotension after having a unremarkable MRI for lightheadedness/dizziness over the last 6 weeks.  Patient reports today she was walking around the house, got stung bilaterally, and then began feeling "bad" with her blood pressure of systolic 619 over diastolic 77.  Patient denies any chest pain, arm pain, nausea, vomiting, shortness of breath..  She denies any fever, chills.  She reports that she has had issues with low blood pressure in the past but never had high blood pressure.  Patient is endorsing that she is continuing to have some lightheadedness with standing but this is not significantly changed from recent previous diagnosis.   Dizziness      Home Medications Prior to Admission medications   Medication Sig Start Date End Date Taking? Authorizing Provider  acetaminophen (TYLENOL) 325 MG tablet Take 650 mg by mouth every 6 (six) hours as needed (for pain.).   Yes [provider]  B Complex-C (SUPER B COMPLEX PO) Take 1 capsule by mouth daily.    Yes [provider]  Calcium Carb-Cholecalciferol (CALCIUM 600/VITAMIN D3) 600-800 MG-UNIT TABS Take 1 capsule by mouth 2 (two) times daily.   Yes [provider]  diclofenac sodium (VOLTAREN) 1 % GEL Apply 2-4 g topically 3 (three) times daily as needed (for pain.).  01/19/15  Yes [provider]  diphenhydrAMINE (BENADRYL) 25 mg capsule Take 25 mg by mouth at bedtime.   Yes [provider]  fluticasone (FLONASE) 50 MCG/ACT nasal spray Place 2 sprays into both  nostrils daily. 11/17/21  Yes Leath-Warren, Alda Lea, NP  Glucosamine-Chondroitin (COSAMIN DS PO) Take 1 tablet by mouth daily.   Yes [provider]  hydrocortisone 2.5 % lotion Apply topically daily. For 1 week 05/09/21  Yes Melynda Ripple, MD  loperamide (IMODIUM) 2 MG capsule Take 2 mg by mouth 4 (four) times daily as needed for diarrhea or loose stools.    Yes [provider]  Multiple Vitamin (MULTIVITAMIN WITH MINERALS) TABS tablet Take 1 tablet by mouth daily.   Yes [provider]  Multiple Vitamins-Minerals (PRESERVISION AREDS PO) Take 1 tablet by mouth daily.   Yes [provider]  omeprazole (PRILOSEC) 20 MG capsule Take 20 mg by mouth 2 (two) times daily before a meal.  04/22/11  Yes Vickey Huger L, NP  Turmeric 500 MG CAPS Take 500 mg by mouth daily.   Yes [provider]  Zinc Sulfate (ZINC 15 PO) Take 1 tablet by mouth daily.   Yes [provider]      Allergies    Aspirin, Iron, Sulfa antibiotics, Sulfasalazine, and Ancef [cefazolin]    Review of Systems   Review of Systems  Neurological:  Positive for dizziness.  All other systems reviewed and are negative.   Physical Exam Updated Vital Signs BP (!) 157/77   Pulse 68   Temp 97.8 F (36.6 C) (Oral)   Resp 16   Ht '5\' 3"'$  (1.6 m)   Wt 77.1 kg   SpO2 100%   BMI 30.11 kg/m  Physical Exam Vitals and nursing  note reviewed.  Constitutional:      General: She is not in acute distress.    Appearance: Normal appearance.  HENT:     Head: Normocephalic and atraumatic.  Eyes:     General:        Right eye: No discharge.        Left eye: No discharge.  Cardiovascular:     Rate and Rhythm: Normal rate and regular rhythm.     Heart sounds: No murmur heard.    No friction rub. No gallop.  Pulmonary:     Effort: Pulmonary effort is normal.     Breath sounds: Normal breath sounds.  Abdominal:     General: Bowel sounds are normal.     Palpations: Abdomen is  soft.  Skin:    General: Skin is warm and dry.     Capillary Refill: Capillary refill takes less than 2 seconds.  Neurological:     Mental Status: She is alert and oriented to person, place, and time.     Comments: Cranial nerves II through XII grossly intact.  Intact finger-nose, intact heel-to-shin.  Alert and oriented x3.  Moves all 4 limbs spontaneously, normal coordination.  No pronator drift.  Intact strength 5 out of 5 bilateral upper and lower extremities.  Patient failed Romberg, became lightheaded and felt like she needed to sit down, and thus gait was not assessed.    Psychiatric:        Mood and Affect: Mood normal.        Behavior: Behavior normal.     ED Results / Procedures / Treatments   Labs (all labs ordered are listed, but only abnormal results are displayed) Labs Reviewed  BASIC METABOLIC PANEL - Abnormal; Notable for the following components:      Result Value   Glucose, Bld 192 (*)    All other components within normal limits  URINALYSIS, ROUTINE W REFLEX MICROSCOPIC - Abnormal; Notable for the following components:   Color, Urine STRAW (*)    All other components within normal limits  CBG MONITORING, ED - Abnormal; Notable for the following components:   Glucose-Capillary 189 (*)    All other components within normal limits  CBC  TROPONIN I (HIGH SENSITIVITY)  TROPONIN I (HIGH SENSITIVITY)    EKG EKG Interpretation  Date/Time:  Thursday February 20 2022 12:51:40 EDT Ventricular Rate:  65 PR Interval:  138 QRS Duration: 84 QT Interval:  426 QTC Calculation: 443 R Axis:   78 Text Interpretation: Normal sinus rhythm Low voltage QRS Borderline ECG When compared with ECG of 13-Aug-2012 08:54, No significant change was found Confirmed by Octaviano Glow [phone]) on 02/20/2022 2:33:10 PM  Radiology MM DIAG BREAST TOMO BILATERAL  Result Date: 02/19/2022 CLINICAL DATA:  Follow-up probably benign calcifications at the patient's lumpectomy site on the left.  Status post left lumpectomy and radiation therapy for breast cancer in 2020. EXAM: DIGITAL DIAGNOSTIC BILATERAL MAMMOGRAM WITH TOMOSYNTHESIS TECHNIQUE: Bilateral digital diagnostic mammography and breast tomosynthesis was performed. COMPARISON:  Previous exam(s). ACR Breast Density Category b: There are scattered areas of fibroglandular density. FINDINGS: The previously demonstrated probably benign calcifications at the lumpectomy site in the upper-outer left breast are slightly more coalescent with an appearance compatible with evolving fat necrosis calcifications. No interval findings suspicious for malignancy in either breast. IMPRESSION: 1. Improving appearance of the previously described probably benign calcifications in the upper outer left breast, compatible with evolving fat necrosis. 2. No evidence of malignancy elsewhere in either breast.  RECOMMENDATION: Bilateral diagnostic mammogram with magnification views on the left in 1 year. That will complete 2 years of follow-up of the left breast probably calcifications. I have discussed the findings and recommendations with the patient. If applicable, a reminder letter will be sent to the patient regarding the next appointment. BI-RADS CATEGORY  3: Probably benign. Electronically Signed   By: Claudie Revering M.D.   On: 02/19/2022 09:26    Procedures Procedures    Medications Ordered in ED Medications  meclizine (ANTIVERT) tablet 25 mg (25 mg Oral Given 02/20/22 1534)  sodium chloride 0.9 % bolus 1,000 mL (0 mLs Intravenous Stopped 02/20/22 1732)    ED Course/ Medical Decision Making/ A&P                           Medical Decision Making Amount and/or Complexity of Data Reviewed Labs: ordered.   This patient is a 81 y.o. female who presents to the ED for concern of elevated blood pressure, and ongoing lightheadedness versus dizziness with movement that has been present for 6 weeks, no significant change today, this involves an extensive number of  treatment options, and is a complaint that carries with it a high risk of complications and morbidity. The emergent differential diagnosis prior to evaluation includes, but is not limited to,  ACS, AAS, PE, Mallory-Weiss, Boerhaave's, Pneumonia, acute bronchitis, asthma or COPD exacerbation, anxiety, MSK pain or traumatic injury to the chest, acid reflux, nonspecific hypertensive urgency, emergency, cardiogenic syncope or near syncope, neurogenic syncope or near syncope, peripheral vertigo, orthostatic hypotension versus other.  .   This is not an exhaustive differential.   Past Medical History / Co-morbidities / Social History: migraines, dyspnea on exertion, history of uterine cancer and breast cancer that she is not currently undergoing treatment for, history of gastric ulcers, with recent history of orthostatic hypotension  Additional history: Chart reviewed. Pertinent results include: Lab work, evaluation at primary care doctor, and recent MRI recently which was unremarkable other than some age-related volume loss  Physical Exam: Physical exam performed. The pertinent findings include: Initially patient failed Romberg and gait due to dizziness on standing, but improved after gait due to dizziness on standing, but improved after meclizine and fluids x1.  She is otherwise neurologically intact, her vital signs are overall stable although she was quite hypertensive on arrival with systolic max of 408  Lab Tests: I ordered, and personally interpreted labs.  The pertinent results include: Troponin negative x2 in context of no active chest pain at this time.  She does have minimally elevated blood glucose at 192, but BMP otherwise unremarkable, urinalysis unremarkable, CBC unremarkable.    Cardiac Monitoring:  The patient was maintained on a cardiac monitor.  My attending physician Dr. Langston Masker viewed and interpreted the cardiac monitored which showed an underlying rhythm of: NSR. I agree with this  interpretation.   Medications: I ordered medication including fluid bolus, Antivert for presumed orthostatic hypotension. Reevaluation of the patient after these medicines showed that the patient improved. I have reviewed the patients home medicines and have made adjustments as needed.  Disposition: After consideration of the diagnostic results and the patients response to treatment, I feel that patient's blood pressure improved despite no treatment, her orthostatic hypotension versus dizziness with standing unrelated to blood pressure improved after meclizine and fluids x1.  It is unclear exactly what is causing her lightheadedness/sudden orthostatic hypotension especially as this seems like it was an acute change around  6 weeks ago, but she did have negative MRI recently, and has no signs of endorgan damage on exam today.  She feels minimally improved at this time, encouraged her to continue following up on her lightheadedness but would not recommend any other additional emergency medicine treatment today.  Hypertension improved 157/77 at time of discharge.  emergency department workup does not suggest an emergent condition requiring admission or immediate intervention beyond what has been performed at this time. The plan is: as above. The patient is safe for discharge and has been instructed to return immediately for worsening symptoms, change in symptoms or any other concerns.  I discussed this case with my attending physician Dr. Regenia Skeeter who cosigned this note including patient's presenting symptoms, physical exam, and planned diagnostics and interventions. Attending physician stated agreement with plan or made changes to plan which were implemented.    Final Clinical Impression(s) / ED Diagnoses Final diagnoses:  Hypertensive urgency    Rx / DC Orders ED Discharge Orders     None         Dorien Kayla Thomas 02/20/22 1917    Wyvonnia Dusky, MD 02/21/22 [phone]

## 2022-02-20 NOTE — Discharge Instructions (Signed)
Your work-up today did not reveal new cause for your lightheadedness and difficulty walking, would recommend following up with your PCP and seeing whether or not they want to refer you to an ear nose and throat specialist or a neurologist or a cardiologist if you have not seen the specialists in the past.  In the meantime you may want to touch base with your primary care doctor to see if they would like to start you on a blood pressure medication if your blood pressure remains elevated like it was today.  Please return if you are having worsening lightheadedness, dizziness, chest pain, shortness of breath.

## 2022-03-04 ENCOUNTER — Inpatient Hospital Stay: Payer: Medicare HMO

## 2022-03-11 ENCOUNTER — Ambulatory Visit (HOSPITAL_COMMUNITY): Payer: Medicare HMO | Admitting: Hematology

## 2022-03-13 ENCOUNTER — Ambulatory Visit (HOSPITAL_COMMUNITY): Payer: Medicare HMO | Attending: Neurology | Admitting: Physical Therapy

## 2022-03-13 ENCOUNTER — Encounter (HOSPITAL_COMMUNITY): Payer: Self-pay | Admitting: Physical Therapy

## 2022-03-13 DIAGNOSIS — R2689 Other abnormalities of gait and mobility: Secondary | ICD-10-CM | POA: Insufficient documentation

## 2022-03-13 DIAGNOSIS — R262 Difficulty in walking, not elsewhere classified: Secondary | ICD-10-CM | POA: Diagnosis present

## 2022-03-13 DIAGNOSIS — R296 Repeated falls: Secondary | ICD-10-CM | POA: Diagnosis present

## 2022-03-13 NOTE — Therapy (Signed)
OUTPATIENT PHYSICAL THERAPY NEURO EVALUATION   Patient Name: Kayla Thomas MRN: [id number] DOB:September 15, 1940, 81 y.o., female Today's Date: 03/13/2022   PCP: Asencion Noble, MD REFERRING PROVIDER: Phillips Odor, MD    PT End of Session - 03/13/22 1216     Visit Number 1    Number of Visits 12    Date for PT Re-Evaluation 04/24/22    Authorization Type [employer] HMO (no auth, no VL)    PT Start Time 1300    PT Stop Time 1346    PT Time Calculation (min) 46 min    Activity Tolerance Patient tolerated treatment well    Behavior During Therapy [facility] for tasks assessed/performed             Past Medical History:  Diagnosis Date   Breast cancer ([place])    Breast cancer ([place])    Chronic back pain    Gastric ulcer    History   GERD (gastroesophageal reflux disease) 01/21/2006   EGD Dr Oneida Alar mild chronic gastritis, (NO h pylori) otherwise normal   Headache(784.0)    Sinus congestion    Thumb tendonitis September 2014   left   Uterine cancer ([place]) 1998   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   Status post vitrectomy March 2021 revealed resolved VM traction   Wears partial dentures    Past Surgical History:  Procedure Laterality Date   ABDOMINAL HYSTERECTOMY     ABSCESS DRAINAGE  03-04-05   insect bite   CARDIAC CATHETERIZATION N/A 10/31/2014   Procedure: Right/Left Heart Cath and Coronary Angiography;  Surgeon: Troy Sine, MD;  Location: [place] [facility];  Service: Cardiovascular;  Laterality: N/A;   CATARACT EXTRACTION W/PHACO  10/06/2011   Procedure: CATARACT EXTRACTION PHACO AND INTRAOCULAR LENS PLACEMENT (IOC);  Surgeon: Williams Che, MD;  Location: [facility];  Service: Ophthalmology;  Laterality: Left;  CDE:  5.76   cataracts     CHOLECYSTECTOMY  11/1999   COLONOSCOPY  05/13/2011   Procedure: COLONOSCOPY;  Surgeon: Dorothyann Peng, MD;  Location: [facility];  Service: Endoscopy;  Laterality: N/A;  8:30   COLONOSCOPY N/A 04/09/2017   Dr. Oneida Alar:  single tubular adenoma removed from the colon, redundant left colon, internal and external hemorrhoids, next colonoscopy in 10 to 15 years.   ESOPHAGOGASTRODUODENOSCOPY  01/21/06   mild antral erythema bx h-pylori/normal esophagus without evidence of mass or Barrett's/normal pylorus and duodenum   PARTIAL MASTECTOMY WITH NEEDLE LOCALIZATION AND AXILLARY SENTINEL LYMPH NODE BX Left 08/18/2012   Procedure: PARTIAL MASTECTOMY WITH NEEDLE LOCALIZATION AND AXILLARY SENTINEL LYMPH NODE BX;  Surgeon: Jamesetta So, MD;  Location: [facility];  Service: General;  Laterality: Left;  Need Frozen Section/Sentinel Node Bx @ 8:00am/Needle Loc @ 9:00am   POLYPECTOMY  04/09/2017   Procedure: POLYPECTOMY;  Surgeon: Danie Binder, MD;  [facility];  Service: Endoscopy;;  Rectal   S/P Hysterectomy  1998   uterine ca   temperal/mandibular  [place] Right 05/04/2014   Procedure: RELEASE TRIGGER FINGER/A-1 PULLEY RIGHT RING FINGER;  Surgeon: Daryll Brod, MD;  Location: [place];  Service: Orthopedics;  Laterality: Right;   Patient Active Problem List   Diagnosis Date Noted   History of vitrectomy 12/05/2021   Intermediate stage nonexudative age-related macular degeneration of left eye 05/21/2021   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   Dry eyes, bilateral 05/03/2020   Follow-up examination  after eye surgery 10/19/2019   Advanced nonexudative age-related macular degeneration of right eye with subfoveal involvement 10/19/2019   Polyp of rectum    Vulval lesion benign, excised 12/22/2016   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   Closed nondisplaced fracture of distal phalanx of right middle finger 02/27/2016   Breast cancer ([place]) 04/30/2015   Abnormal ECG    DOE (dyspnea on exertion) 04/12/2014   MIGRAINE HEADACHE 01/23/2009   GERD 01/23/2009   GASTRITIS 01/23/2009   LOOSE STOOLS 01/23/2009   ALLERGY 01/23/2009    UTERINE CANCER, HX OF 01/23/2009    ONSET DATE: approx 2.5 months ago  REFERRING DIAG: PT eval/tx for gait training per Phillips Odor, MD   THERAPY DIAG:  Other abnormalities of gait and mobility  Difficulty in walking, not elsewhere classified  Repeated falls  Rationale for Evaluation and Treatment Rehabilitation  SUBJECTIVE: States she developed sudden difficulty walking about 2.5 months ago. Denies vertigo but states she was feeling off balance and having falls. Especially when she would sit up from her bed at night. Symptoms would last about 30 seconds. Denies focal weakness, radicular symptoms, no changes in vision or hearing, denies tinnitus. Reports she had a positive orthostatic hypotension test, but then had an episode of hypertensive urgency. Has since starting taking a medication for her BP. Reports they were concerned for CVA but MRI was unrevealing. Once she started taking her medication; she has been feeling much better, about 80% improved but still feels off balance. 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                                                                                                                                                                                              PAIN:  Are you having pain? No  PRECAUTIONS: None  WEIGHT BEARING RESTRICTIONS No  FALLS: Has patient fallen in last 6 months? Yes. 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   MRI IMPRESSION: 1. No acute intracranial  abnormality. 2. Mild chronic microvascular ischemic changes of the white matter.    COGNITION: Overall cognitive status: Within functional limits for tasks assessed   SENSATION: [facility]  COORDINATION: FNF and Heel knee shin WNL   DTRs:  Biceps 2+ = Normal, Brachioradialis 2+ = Normal, Triceps 1 = Trace , and Patella 2+ = Normal  Hoffman's absent, no tone or clonus noted Head impulse test + Left   LOWER EXTREMITY ROM:     wfl  LOWER EXTREMITY MMT:    MMT Right Eval Left Eval  Hip flexion 4+ 4+  Hip extension    Hip abduction 5 5  Hip adduction    Hip internal rotation    Hip external rotation    Knee flexion    Knee extension 5 5  Ankle dorsiflexion 5 5  Ankle plantarflexion    Ankle inversion    Ankle eversion    (Blank rows = not tested)    TRANSFERS: IND   GAIT: Gait pattern: step through pattern, decreased stance time- Left, decreased stride length, scissoring, trendelenburg, and narrow BOS Distance walked: 294 Assistive device utilized: None Level of assistance: Modified independence Comments: Intermittent stagger towards right side, intermittent scissoring, able to self correct  FUNCTIONAL TESTs:  5 times sit to stand: 13.6 seconds 2 minute walk test: 294 feet no AD Berg Balance Scale: 41/56    BERG  1. SITTING TO STANDING  4 able to stand without using hands and stabilize independently  2. STANDING UNSUPPORTED  4 able to stand safely for 2 minutes  If a subject is able to stand 2 minutes unsupported, score full points for sitting unsupported. Proceed to item #4  3. SITTING WITH BACK UNSUPPORTED BUT FEET SUPPORTED ON FLOOR OR ON A STOOL 4 able to sit safely and securely for 2 minutes  4. STANDING TO SITTING 4 sits safely with minimal use of hands  5. TRANSFERS 4 able to transfer safely with minor use of hands  6. STANDING UNSUPPORTED WITH EYES CLOSED  3 able to stand 10 seconds with supervision  7. STANDING UNSUPPORTED WITH FEET  TOGETHER  3 able to place feet together independently and stand 1 minute with supervision  8. REACHING FORWARD WITH OUTSTRETCHED ARM WHILE STANDING  3 can reach forward 12 cm (5 inches)  9. PICK UP OBJECT FROM THE FLOOR FROM A STANDING POSITION  3 able to pick up slipper but needs supervision  10. TURNING TO LOOK BEHIND OVER LEFT AND RIGHT SHOULDERS WHILE STANDING 1 needs supervision when turning  11. TURN 360 DEGREES 1 needs close supervision or verbal cuing  12. PLACE ALTERNATE FOOT ON STEP OR STOOL WHILE STANDING UNSUPPORTED 2 able to complete 4 steps without aid with supervision  13. STANDING UNSUPPORTED ONE FOOT IN FRONT  3 able to place foot ahead independently and hold 30 seconds   14. STANDING ON ONE LEG 2 able to lift leg independently and hold = 3 seconds  TOTAL SCORE  41/56 History of falls and BBS < 51, or no history of falls and BBS < 42 is predictive of falls ([place]-Cook, 1997        PATIENT SURVEYS:   DHI Total Score: 34 / 100 Physical Score: 8 / 28 Emotional Score: 8 / 36 Functional Score: 18 / 36  TODAY'S TREATMENT:  Eval 5x sit to stand 2MWT BERG Special tests Education   PATIENT EDUCATION: Education details: Findings, safety, POC Person educated: Patient Education method: Explanation Education comprehension: verbalized understanding   HOME EXERCISE PROGRAM: N/a    GOALS: Goals reviewed with patient? Yes  SHORT TERM GOALS: Target date: 04/03/22  Patient will be independent with initial HEP and  self-management strategies to improve functional outcomes Baseline:  Goal status: INITIAL    LONG TERM GOALS: Target date: 04/24/22  Patient will be independent with advanced HEP and self-management strategies to improve functional outcomes Baseline:  Goal status: INITIAL  2.  Patient will reduce Dizziness Handicap Inventory score by at least 4 points to indicate improvement in functional outcomes. (([place]). A  four-point change in the DHI-S would be statistically significant at the p = 0.05 level) Baseline: 34 / 100 Goal status: INITIAL  3.  Patient will reduce fall risk based on BERG balance score 51 or greater (in patients with hx of falls. Baseline: 41/56 Goal status: INITIAL  4. Patient will report at least 95% improvement in symptoms from current baseline prior to symptoms, allowing her to return to water aerobics class at [employer] where she works. Baseline: Reports 80% back to baseline Goal status: INITIAL  5. Patient will be able to ambulate at least 325 feet during 2MWT with LRAD to demonstrate improved ability to perform functional mobility and associated tasks. Baseline: 294 feet no AD Goal status: INITIAL   ASSESSMENT:  CLINICAL IMPRESSION: Patient is a 81 y.o. female who was seen today for physical therapy evaluation and treatment for impaired gait and balance. Patient presents with deficits including dizziness (mostly resolved), decreased endurance, limited activity tolerance, abnormalities of gait, impaired balance, and repeated falls, contributing to impaired functional mobility with ADLs and IADLs. Patient is currently restricted in ADLs and is at high risk of falling as indicated by objective and subjective functional outcome measures, as well as reported history and objective measures taken during this exam. Patient will benefit from skilled physical therapy intervention in order to improve function and reduce the impairments listed above.    OBJECTIVE IMPAIRMENTS Abnormal gait, decreased activity tolerance, decreased balance, decreased endurance, decreased knowledge of condition, decreased mobility, difficulty walking, and dizziness.   ACTIVITY LIMITATIONS carrying, standing, stairs, and locomotion level  PARTICIPATION LIMITATIONS: meal prep, cleaning, laundry, shopping, community activity, occupation, and yard work  PERSONAL FACTORS Age and Time since onset of  injury/illness/exacerbation are also affecting patient's functional outcome.   REHAB POTENTIAL: Excellent  CLINICAL DECISION MAKING: Stable/uncomplicated  EVALUATION COMPLEXITY: Low  PLAN: PT FREQUENCY: 2x/week  PT DURATION: 8 weeks  PLANNED INTERVENTIONS: Therapeutic exercises, Therapeutic activity, Neuromuscular re-education, Balance training, Gait training, Patient/Family education, Self Care, Stair training, Vestibular training, Canalith repositioning, DME instructions, and Re-evaluation  PLAN FOR NEXT SESSION: Dix-hallpike test; horizontal roll test; orthostatics test; (treat accordingly; if negative, focus on balance and gait, return to PLOF.   Candie Mile, PT, DPT Physical Therapist Acute Rehabilitation Services [facility]  03/13/2022, 2:14 PM

## 2022-03-14 NOTE — Addendum Note (Signed)
Addended by: Ellouise Newer on: 03/14/2022 10:48 AM   Modules accepted: Orders

## 2022-03-18 ENCOUNTER — Encounter (HOSPITAL_COMMUNITY): Payer: Self-pay | Admitting: Physical Therapy

## 2022-03-18 ENCOUNTER — Other Ambulatory Visit: Payer: Self-pay | Admitting: Hematology

## 2022-03-18 ENCOUNTER — Ambulatory Visit (HOSPITAL_COMMUNITY): Payer: Medicare HMO | Admitting: Physical Therapy

## 2022-03-18 ENCOUNTER — Inpatient Hospital Stay: Payer: Medicare HMO | Attending: Hematology | Admitting: Hematology

## 2022-03-18 VITALS — BP 137/76 | HR 65 | Temp 97.7°F | Resp 18 | Ht 63.0 in | Wt 179.1 lb

## 2022-03-18 DIAGNOSIS — Z170 Estrogen receptor positive status [ER+]: Secondary | ICD-10-CM

## 2022-03-18 DIAGNOSIS — C50419 Malignant neoplasm of upper-outer quadrant of unspecified female breast: Secondary | ICD-10-CM

## 2022-03-18 DIAGNOSIS — R2689 Other abnormalities of gait and mobility: Secondary | ICD-10-CM | POA: Diagnosis not present

## 2022-03-18 DIAGNOSIS — Z853 Personal history of malignant neoplasm of breast: Secondary | ICD-10-CM | POA: Insufficient documentation

## 2022-03-18 DIAGNOSIS — R262 Difficulty in walking, not elsewhere classified: Secondary | ICD-10-CM

## 2022-03-18 DIAGNOSIS — R296 Repeated falls: Secondary | ICD-10-CM

## 2022-03-18 NOTE — Therapy (Signed)
OUTPATIENT PHYSICAL THERAPY NEURO TREATMENT   Patient Name: Kayla Thomas MRN: [id number] DOB:1941-01-25, 81 y.o., female Today's Date: 03/18/2022   PCP: Asencion Noble, MD REFERRING PROVIDER: Phillips Odor, MD    PT End of Session - 03/18/22 0815     Visit Number 2    Number of Visits 12    Date for PT Re-Evaluation 04/24/22    Authorization Type [employer] HMO (no auth, no VL)    Progress Note Due on Visit 10    PT Start Time 0815    PT Stop Time 0901    PT Time Calculation (min) 46 min    Activity Tolerance Patient tolerated treatment well    Behavior During Therapy [facility] for tasks assessed/performed             Past Medical History:  Diagnosis Date   Breast cancer ([place])    Breast cancer ([place])    Chronic back pain    Gastric ulcer    History   GERD (gastroesophageal reflux disease) 01/21/2006   EGD Dr Oneida Alar mild chronic gastritis, (NO h pylori) otherwise normal   Headache(784.0)    Sinus congestion    Thumb tendonitis September 2014   left   Uterine cancer ([place]) 1998   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   Status post vitrectomy March 2021 revealed resolved VM traction   Wears partial dentures    Past Surgical History:  Procedure Laterality Date   ABDOMINAL HYSTERECTOMY     ABSCESS DRAINAGE  03-04-05   insect bite   CARDIAC CATHETERIZATION N/A 10/31/2014   Procedure: Right/Left Heart Cath and Coronary Angiography;  Surgeon: Troy Sine, MD;  Location: [place] CV LAB;  Service: Cardiovascular;  Laterality: N/A;   CATARACT EXTRACTION W/PHACO  10/06/2011   Procedure: CATARACT EXTRACTION PHACO AND INTRAOCULAR LENS PLACEMENT (IOC);  Surgeon: Williams Che, MD;  Location: [facility];  Service: Ophthalmology;  Laterality: Left;  CDE:  5.76   cataracts     CHOLECYSTECTOMY  11/1999   COLONOSCOPY  05/13/2011   Procedure: COLONOSCOPY;  Surgeon: Dorothyann Peng, MD;  Location: [facility];  Service: Endoscopy;  Laterality: N/A;  8:30   COLONOSCOPY  N/A 04/09/2017   Dr. Oneida Alar: single tubular adenoma removed from the colon, redundant left colon, internal and external hemorrhoids, next colonoscopy in 10 to 15 years.   ESOPHAGOGASTRODUODENOSCOPY  01/21/06   mild antral erythema bx h-pylori/normal esophagus without evidence of mass or Barrett's/normal pylorus and duodenum   PARTIAL MASTECTOMY WITH NEEDLE LOCALIZATION AND AXILLARY SENTINEL LYMPH NODE BX Left 08/18/2012   Procedure: PARTIAL MASTECTOMY WITH NEEDLE LOCALIZATION AND AXILLARY SENTINEL LYMPH NODE BX;  Surgeon: Jamesetta So, MD;  Location: [facility];  Service: General;  Laterality: Left;  Need Frozen Section/Sentinel Node Bx @ 8:00am/Needle Loc @ 9:00am   POLYPECTOMY  04/09/2017   Procedure: POLYPECTOMY;  Surgeon: Danie Binder, MD;  [facility]: [facility];  Service: Endoscopy;;  Rectal   S/P Hysterectomy  1998   uterine ca   temperal/mandibular  [place] Right 05/04/2014   Procedure: RELEASE TRIGGER FINGER/A-1 PULLEY RIGHT RING FINGER;  Surgeon: Daryll Brod, MD;  Location: [place];  Service: Orthopedics;  Laterality: Right;   Patient Active Problem List   Diagnosis Date Noted   History of vitrectomy 12/05/2021   Intermediate stage nonexudative age-related macular degeneration of left eye 05/21/2021   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  Dry eyes, bilateral 05/03/2020   Follow-up examination after eye surgery 10/19/2019   Advanced nonexudative age-related macular degeneration of right eye with subfoveal involvement 10/19/2019   Polyp of rectum    Vulval lesion benign, excised 12/22/2016   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   Closed nondisplaced fracture of distal phalanx of right middle finger 02/27/2016   Breast cancer ([place]) 04/30/2015   Abnormal ECG    DOE (dyspnea on exertion) 04/12/2014   MIGRAINE HEADACHE 01/23/2009   GERD 01/23/2009   GASTRITIS 01/23/2009   LOOSE STOOLS  01/23/2009   ALLERGY 01/23/2009   UTERINE CANCER, HX OF 01/23/2009    ONSET DATE: approx 2.5 months ago  REFERRING DIAG: PT eval/tx for gait training per Phillips Odor, MD   THERAPY DIAG:  Other abnormalities of gait and mobility  Difficulty in walking, not elsewhere classified  Repeated falls  Rationale for Evaluation and Treatment Rehabilitation  SUBJECTIVE: Patient states her blood pressure has been normal when she checks at home. Has not had any episodes of instability, but did feel a little light headed this morning when she woke up.                                                                                                                                                                                             PAIN:  Are you having pain? No  PRECAUTIONS: None  WEIGHT BEARING RESTRICTIONS No  FALLS: Has patient fallen in last 6 months? Yes. 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   MRI IMPRESSION: 1. No acute intracranial abnormality. 2. Mild chronic microvascular ischemic changes of the white matter.    COGNITION: Overall cognitive status: Within functional limits for tasks assessed   SENSATION: WFL  COORDINATION: FNF and Heel knee shin WNL   DTRs:  Biceps 2+ = Normal, Brachioradialis 2+ = Normal, Triceps 1 = Trace , and Patella 2+ = Normal  Hoffman's absent, no tone or clonus noted Head impulse test + Left   LOWER EXTREMITY ROM:     wfl  LOWER EXTREMITY MMT:    MMT Right Eval Left Eval  Hip  flexion 4+ 4+  Hip extension  Hip abduction 5 5  Hip adduction    Hip internal rotation    Hip external rotation    Knee flexion    Knee extension 5 5  Ankle dorsiflexion 5 5  Ankle plantarflexion    Ankle inversion    Ankle eversion    (Blank rows = not tested)    TRANSFERS: IND   GAIT: Gait pattern: step through pattern, decreased stance time- Left, decreased stride length, scissoring, trendelenburg, and narrow BOS Distance walked: 294 Assistive device utilized: None Level of assistance: Modified independence Comments: Intermittent stagger towards right side, intermittent scissoring, able to self correct  FUNCTIONAL TESTs:  5 times sit to stand: 13.6 seconds 2 minute walk test: 294 feet no AD Berg Balance Scale: 41/56    BERG  1. SITTING TO STANDING  4 able to stand without using hands and stabilize independently  2. STANDING UNSUPPORTED  4 able to stand safely for 2 minutes  If a subject is able to stand 2 minutes unsupported, score full points for sitting unsupported. Proceed to item #4  3. SITTING WITH BACK UNSUPPORTED BUT FEET SUPPORTED ON FLOOR OR ON A STOOL 4 able to sit safely and securely for 2 minutes  4. STANDING TO SITTING 4 sits safely with minimal use of hands  5. TRANSFERS 4 able to transfer safely with minor use of hands  6. STANDING UNSUPPORTED WITH EYES CLOSED  3 able to stand 10 seconds with supervision  7. STANDING UNSUPPORTED WITH FEET TOGETHER  3 able to place feet together independently and stand 1 minute with supervision  8. REACHING FORWARD WITH OUTSTRETCHED ARM WHILE STANDING  3 can reach forward 12 cm (5 inches)  9. PICK UP OBJECT FROM THE FLOOR FROM A STANDING POSITION  3 able to pick up slipper but needs supervision  10. TURNING TO LOOK BEHIND OVER LEFT AND RIGHT SHOULDERS WHILE STANDING 1 needs supervision when turning  11. TURN 360 DEGREES 1 needs close supervision or verbal cuing  12. PLACE ALTERNATE FOOT ON STEP  OR STOOL WHILE STANDING UNSUPPORTED 2 able to complete 4 steps without aid with supervision  13. STANDING UNSUPPORTED ONE FOOT IN FRONT  3 able to place foot ahead independently and hold 30 seconds   14. STANDING ON ONE LEG 2 able to lift leg independently and hold = 3 seconds  TOTAL SCORE  41/56 History of falls and BBS < 51, or no history of falls and BBS < 42 is predictive of falls ([place], 1997    Orthostatics: (03/18/22) Supine 154/69 HR69 Seated 142/69 HR71 Standing 150/70 HR 73 Standing at 3 minutes 145/72 HR 70  Dix-hallpike test: (03/18/22) Negative Rt, Rt beating nystagmus in Lt dix-hallpike position <30 second, very small amplitude, no rotational component, +dizziness, very mild  Horizontal roll test: (03/18/22) apogeotropic nystagums in left sidelying <15 seconds, very small amplitude     PATIENT SURVEYS:   DHI Total Score: 34 / 100 Physical Score: 8 / 28 Emotional Score: 8 / 36 Functional Score: 18 / 36  TODAY'S TREATMENT:  03/18/22 Orthostatics taken Horizontal roll and dix-hallpike performed Education HEP performed, reviewed, and handout provided   Eval 5x sit to stand 2MWT BERG Special tests Education   PATIENT EDUCATION: Education details: Findings, safety, POC Person educated: Patient Education method: Explanation Education comprehension: verbalized understanding   HOME EXERCISE PROGRAM: Access Code: [email or web address] URL: [email or web address] Date: 03/18/2022 Prepared by: Candie Mile  Exercises - Standing Tandem Balance with Counter Support  - 3 x daily -  7 x weekly - 3 sets - 15 seconds hold - Standing March with Counter Support  - 3 x daily - 7 x weekly - 3 sets - 10 reps - Heel Toe Raises with Counter Support  - 3 x daily - 7 x weekly - 3 sets - 10 reps - Gaze Stabilty (VOR) x1 Feet Together on Firm Ground With Horizontal Head Turns  - 3 x daily - 7 x weekly - 3 sets - 30 seconds hold    GOALS: Goals reviewed with  patient? Yes  SHORT TERM GOALS: Target date: 04/03/22  Patient will be independent with initial HEP and self-management strategies to improve functional outcomes Baseline:  Goal status: INITIAL    LONG TERM GOALS: Target date: 04/24/22  Patient will be independent with advanced HEP and self-management strategies to improve functional outcomes Baseline:  Goal status: INITIAL  2.  Patient will reduce Dizziness Handicap Inventory score by at least 4 points to indicate improvement in functional outcomes. (([place]). A four-point change in the DHI-S would be statistically significant at the p = 0.05 level) Baseline: 34 / 100 Goal status: INITIAL  3.  Patient will reduce fall risk based on BERG balance score 51 or greater (in patients with hx of falls. Baseline: 41/56 Goal status: INITIAL  4. 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 Baseline: Reports 80% back to baseline Goal status: INITIAL  5. Patient will be able to ambulate at least 325 feet during 2MWT with LRAD to demonstrate improved ability to perform functional mobility and associated tasks. 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 Symptoms very mild and brief, possibly nearly resolved or habituated at this point. Will continue to monitor during progression of rehab and treat as indicated. 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 Orthostatics taken, asymptomatic, not concerning, see above. Patient will continue to benefit from skilled physical  therapy services to further improve independence with functional mobility.    OBJECTIVE IMPAIRMENTS Abnormal gait, decreased activity tolerance, decreased balance, decreased endurance, decreased knowledge of condition, decreased mobility, difficulty walking, and dizziness.   ACTIVITY LIMITATIONS carrying, standing, stairs, and locomotion level  PARTICIPATION LIMITATIONS: meal prep, cleaning, laundry, shopping, community activity, occupation, and yard work  PERSONAL FACTORS Age and Time since onset of injury/illness/exacerbation are also affecting patient's functional outcome.   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 Reassess with dix-hallpike and horizontal roll test if dizziness returns.   Candie Mile, PT, DPT Physical Therapist Acute Rehabilitation Services [facility]  03/18/2022, 9:03 AM

## 2022-03-18 NOTE — Progress Notes (Signed)
[facility] [address], [place] [address]   Patient Care Team: Asencion Noble, MD as PCP - General (Internal Medicine) Danie Binder, MD (Inactive) (Gastroenterology) Herminio Commons, MD (Inactive) as Attending Physician (Cardiology) Derek Jack, MD as Medical Oncologist (Medical Oncology)  SUMMARY OF ONCOLOGIC HISTORY: Oncology History  Breast cancer [facility])   Initial Diagnosis   Breast cancer ([place])   07/13/2017 Miscellaneous   BCI results revealed LOW risk of late recurrence (3%) and LOW likelihood of benefit from extended anti-estrogen therapy.      CHIEF COMPLIANT: Follow-up of breast cancer   INTERVAL HISTORY: Seen for follow-up of breast cancer.  Denies any new onset pains.  Has occasional dizziness from vertigo.  Chronic diarrhea is also stable.  She has labs drawn at Dr. Ria Comment office.  REVIEW OF SYSTEMS:   Review of Systems  Constitutional:  Negative for appetite change and fatigue (75%).  Gastrointestinal:  Positive for diarrhea.  Neurological:  Positive for dizziness. Negative for headaches.  All other systems reviewed and are negative.   I have reviewed the past medical history, past surgical history, social history and family history with the patient and they are unchanged from previous note.   ALLERGIES:   is allergic to aspirin, iron, sulfa antibiotics, sulfasalazine, and ancef [cefazolin].   MEDICATIONS:  Current Outpatient Medications  Medication Sig Dispense Refill   acetaminophen (TYLENOL) 325 MG tablet Take 650 mg by mouth every 6 (six) hours as needed (for pain.).     amLODipine (NORVASC) 2.5 MG tablet Take 2.5 mg by mouth at bedtime.     B Complex-C (SUPER B COMPLEX PO) Take 1 capsule by mouth daily.      Calcium Carb-Cholecalciferol (CALCIUM 600/VITAMIN D3) 600-800 MG-UNIT TABS Take 1 capsule by mouth 2 (two) times daily.     diclofenac sodium (VOLTAREN) 1 % GEL Apply 2-4 g topically 3 (three) times daily as  needed (for pain.).      diphenhydrAMINE (BENADRYL) 25 mg capsule Take 25 mg by mouth at bedtime.     fluticasone (FLONASE) 50 MCG/ACT nasal spray Place 2 sprays into both nostrils daily. 16 g 0   Glucosamine-Chondroitin (COSAMIN DS PO) Take 1 tablet by mouth daily.     hydrocortisone 2.5 % lotion Apply topically daily. For 1 week 59 mL 0   loperamide (IMODIUM) 2 MG capsule Take 2 mg by mouth 4 (four) times daily as needed for diarrhea or loose stools.      meclizine (ANTIVERT) 25 MG tablet Take 25 mg by mouth 3 (three) times daily as needed.     Multiple Vitamin (MULTIVITAMIN WITH MINERALS) TABS tablet Take 1 tablet by mouth daily.     Multiple Vitamins-Minerals (PRESERVISION AREDS PO) Take 1 tablet by mouth daily.     omeprazole (PRILOSEC) 20 MG capsule Take 20 mg by mouth 2 (two) times daily before a meal.      Turmeric 500 MG CAPS Take 500 mg by mouth daily.     Zinc Sulfate (ZINC 15 PO) Take 1 tablet by mouth daily.     No current facility-administered medications for this visit.     PHYSICAL EXAMINATION: Performance status (ECOG): 1 - Symptomatic but completely ambulatory  Vitals:   03/18/22 1152  BP: 137/76  Pulse: 65  Resp: 18  Temp: 97.7 F (36.5 C)  SpO2: 98%   Wt Readings from Last 3 Encounters:  03/18/22 179 lb 1.6 oz (81.2 kg)  02/20/22 170 lb (77.1 kg)  02/26/21 180 lb 12.8 oz (82 kg)   Physical Exam Vitals reviewed.  Constitutional:      Appearance: Normal appearance.  Cardiovascular:     Rate and Rhythm: Normal rate and regular rhythm.     Pulses: Normal pulses.     Heart sounds: Normal heart sounds.  Pulmonary:     Effort: Pulmonary effort is normal.     Breath sounds: Normal breath sounds.  Chest:  Breasts:    Right: Normal.     Left: No skin change (UOQ lumpectomy scar stable).  Abdominal:     Palpations: Abdomen is soft. There is no hepatomegaly, splenomegaly or mass.     Tenderness: There is no abdominal tenderness.  Lymphadenopathy:     Upper  Body:     Right upper body: No supraclavicular, axillary or pectoral adenopathy.     Left upper body: No supraclavicular, axillary or pectoral adenopathy.  Neurological:     General: No focal deficit present.     Mental Status: She is alert and oriented to person, place, and time.  Psychiatric:        Mood and Affect: Mood normal.        Behavior: Behavior normal.     Breast Exam Chaperone: Thana Ates     LABORATORY DATA:  I have reviewed the data as listed    Latest Ref Rng & Units 02/20/2022    1:06 PM 02/18/2021   11:00 AM 01/18/2020   11:00 AM  CMP  Glucose 70 - 99 mg/dL 192  175  139   BUN 8 - 23 mg/dL _0 Creatinine 0.44 - 1.00 mg/dL 0.74  0.73  0.78   Sodium 135 - 145 mmol/L 138  137  137   Potassium 3.5 - 5.1 mmol/L 4.1  4.9  4.4   Chloride 98 - 111 mmol/L 106  102  102   CO2 22 - 32 mmol/L _1 Calcium 8.9 - 10.3 mg/dL 9.0  9.0  9.1   Total Protein 6.5 - 8.1 g/dL  6.7  6.6   Total Bilirubin 0.3 - 1.2 mg/dL  0.4  0.7   Alkaline Phos 38 - 126 U/L  94  89   AST 15 - 41 U/L  18  18   ALT 0 - 44 U/L  19  17    No results found for: "CAN153" Lab Results  Component Value Date   WBC 8.6 02/20/2022   HGB 14.7 02/20/2022   HCT 42.7 02/20/2022   MCV 93.2 02/20/2022   PLT 222 02/20/2022   NEUTROABS 1.7 11/17/2021    ASSESSMENT:  1.  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  Status post lumpectomy followed by adjuvant radiation. - Anastrozole started in July 2014, switched to tamoxifen in December 2016 completed in July 2019. - BCI testing in January 2019 with low likelihood of benefit with extended adjuvant therapy.  2.  Bone health: - Last DEXA scan on 12/12/2016 T score -1.   PLAN:  1.  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 - Bilateral diagnostic mammogram with magnification views on the left  in 1 year was recommended. - Reviewed labs from Dr. Ria Comment office which shows normal CBC and LFTs. - Recommend follow-up in 1 year with repeat labs and mammogram.  2.  Bone health: -  Last vitamin D level was normal.  Continue calcium and vitamin D supplements.  Breast Cancer therapy associated bone loss: I have recommended calcium, Vitamin D and weight bearing exercises.  Orders placed this encounter:  Orders Placed This Encounter  Procedures   MM Digital Diagnostic Bilat   CBC with Differential   Comprehensive metabolic panel     The patient has a good understanding of the overall plan. She agrees with it. She will call with any problems that may develop before the next visit here.  Derek Jack, MD [place] [phone]

## 2022-03-18 NOTE — Patient Instructions (Addendum)
[facility]  Discharge Instructions  You were seen and examined today by Dr. Delton Coombes.  Your labs were stable. Your mammogram will be repeated in 1 year as there were no signs of cancer.  Follow-up as scheduled.  Thank you for choosing [facility] to provide your oncology and hematology care.   To afford each patient quality time with our provider, please arrive at least 15 minutes before your scheduled appointment time. You may need to reschedule your appointment if you arrive late (10 or more minutes). Arriving late affects you and other patients whose appointments are after yours.  Also, if you miss three or more appointments without notifying the office, you may be dismissed from the clinic at the provider's discretion.    Again, thank you for choosing [facility].  Our hope is that these requests will decrease the amount of time that you wait before being seen by our physicians.   If you have a lab appointment with the [facility] please come in thru the Main Entrance and check in at the main information desk.           _____________________________________________________________  Should you have questions after your visit to [facility], please contact our office at [phone] and follow the prompts.  Our office hours are 8:00 a.m. to 4:30 p.m. Monday - Thursday and 8:00 a.m. to 2:30 p.m. Friday.  Please note that voicemails left after 4:00 p.m. may not be returned until the following business day.  We are closed weekends and all major holidays.  You do have access to a nurse 24-7, just call the main number to the clinic [phone] and do not press any options, hold on the line and a nurse will answer the phone.    For prescription refill requests, have your pharmacy contact our office and allow 72 hours.    Masks are optional in the cancer centers. If you would like for your care team to wear a  mask while they are taking care of you, please let them know. You may have one support person who is at least 81 years old accompany you for your appointments.

## 2022-03-20 ENCOUNTER — Ambulatory Visit (HOSPITAL_COMMUNITY): Payer: Medicare HMO | Admitting: Physical Therapy

## 2022-03-20 ENCOUNTER — Encounter (HOSPITAL_COMMUNITY): Payer: Self-pay | Admitting: Physical Therapy

## 2022-03-20 DIAGNOSIS — R262 Difficulty in walking, not elsewhere classified: Secondary | ICD-10-CM

## 2022-03-20 DIAGNOSIS — R2689 Other abnormalities of gait and mobility: Secondary | ICD-10-CM | POA: Diagnosis not present

## 2022-03-20 DIAGNOSIS — R296 Repeated falls: Secondary | ICD-10-CM

## 2022-03-20 NOTE — Therapy (Signed)
OUTPATIENT PHYSICAL THERAPY NEURO TREATMENT   Patient Name: Kayla Thomas MRN: [id number] DOB:October 15, 1940, 81 y.o., female Today's Date: 03/20/2022   PCP: Asencion Noble, MD REFERRING PROVIDER: Phillips Odor, MD    PT End of Session - 03/20/22 0900     Visit Number 3    Number of Visits 12    Date for PT Re-Evaluation 04/24/22    Authorization Type [employer] HMO (no auth, no VL)    Progress Note Due on Visit 10    PT Start Time 0901    PT Stop Time 0939    PT Time Calculation (min) 38 min    Activity Tolerance Patient tolerated treatment well    Behavior During Therapy [facility] for tasks assessed/performed             Past Medical History:  Diagnosis Date   Breast cancer ([place])    Breast cancer ([place])    Chronic back pain    Gastric ulcer    History   GERD (gastroesophageal reflux disease) 01/21/2006   EGD Dr Oneida Alar mild chronic gastritis, (NO h pylori) otherwise normal   Headache(784.0)    Sinus congestion    Thumb tendonitis September 2014   left   Uterine cancer ([place]) 1998   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   Status post vitrectomy March 2021 revealed resolved VM traction   Wears partial dentures    Past Surgical History:  Procedure Laterality Date   ABDOMINAL HYSTERECTOMY     ABSCESS DRAINAGE  03-04-05   insect bite   CARDIAC CATHETERIZATION N/A 10/31/2014   Procedure: Right/Left Heart Cath and Coronary Angiography;  Surgeon: Troy Sine, MD;  Location: [place] [facility];  Service: Cardiovascular;  Laterality: N/A;   CATARACT EXTRACTION W/PHACO  10/06/2011   Procedure: CATARACT EXTRACTION PHACO AND INTRAOCULAR LENS PLACEMENT (IOC);  Surgeon: Williams Che, MD;  Location: [facility];  Service: Ophthalmology;  Laterality: Left;  CDE:  5.76   cataracts     CHOLECYSTECTOMY  11/1999   COLONOSCOPY  05/13/2011   Procedure: COLONOSCOPY;  Surgeon: Dorothyann Peng, MD;  Location: [facility];  Service: Endoscopy;  Laterality: N/A;  8:30   COLONOSCOPY  N/A 04/09/2017   Dr. Oneida Alar: single tubular adenoma removed from the colon, redundant left colon, internal and external hemorrhoids, next colonoscopy in 10 to 15 years.   ESOPHAGOGASTRODUODENOSCOPY  01/21/06   mild antral erythema bx h-pylori/normal esophagus without evidence of mass or Barrett's/normal pylorus and duodenum   PARTIAL MASTECTOMY WITH NEEDLE LOCALIZATION AND AXILLARY SENTINEL LYMPH NODE BX Left 08/18/2012   Procedure: PARTIAL MASTECTOMY WITH NEEDLE LOCALIZATION AND AXILLARY SENTINEL LYMPH NODE BX;  Surgeon: Jamesetta So, MD;  Location: [facility];  Service: General;  Laterality: Left;  Need Frozen Section/Sentinel Node Bx @ 8:00am/Needle Loc @ 9:00am   POLYPECTOMY  04/09/2017   Procedure: POLYPECTOMY;  Surgeon: Danie Binder, MD;  Location[facility];  Service: Endoscopy;;  Rectal   S/P Hysterectomy  1998   uterine ca   temperal/mandibular  [place] Right 05/04/2014   Procedure: RELEASE TRIGGER FINGER/A-1 PULLEY RIGHT RING FINGER;  Surgeon: Daryll Brod, MD;  Location: [place];  Service: Orthopedics;  Laterality: Right;   Patient Active Problem List   Diagnosis Date Noted   History of vitrectomy 12/05/2021   Intermediate stage nonexudative age-related macular degeneration of left eye 05/21/2021   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  Dry eyes, bilateral 05/03/2020   Follow-up examination after eye surgery 10/19/2019   Advanced nonexudative age-related macular degeneration of right eye with subfoveal involvement 10/19/2019   Polyp of rectum    Vulval lesion benign, excised 12/22/2016   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   Closed nondisplaced fracture of distal phalanx of right middle finger 02/27/2016   Breast cancer ([place]) 04/30/2015   Abnormal ECG    DOE (dyspnea on exertion) 04/12/2014   MIGRAINE HEADACHE 01/23/2009   GERD 01/23/2009   GASTRITIS 01/23/2009   LOOSE STOOLS  01/23/2009   ALLERGY 01/23/2009   UTERINE CANCER, HX OF 01/23/2009    ONSET DATE: approx 2.5 months ago  REFERRING DIAG: PT eval/tx for gait training per Phillips Odor, MD   THERAPY DIAG:  Other abnormalities of gait and mobility  Difficulty in walking, not elsewhere classified  Repeated falls  Rationale for Evaluation and Treatment Rehabilitation  SUBJECTIVE: Patient states still a little wobbly. Things are getting better but she feels like she has leveled off. Some times she still has some balance issues.                                                                                                                                                                                              PAIN:  Are you having pain? No  PRECAUTIONS: None  WEIGHT BEARING RESTRICTIONS No  FALLS: Has patient fallen in last 6 months? Yes. 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   MRI IMPRESSION: 1. No acute intracranial abnormality. 2. Mild chronic microvascular ischemic changes of the white matter.    COGNITION: Overall cognitive status: Within functional limits for tasks assessed   SENSATION: WFL  COORDINATION: FNF and Heel knee shin WNL   DTRs:  Biceps 2+ = Normal, Brachioradialis 2+ = Normal, Triceps 1 = Trace , and Patella 2+ = Normal  Hoffman's absent, no tone or clonus noted Head impulse test + Left   LOWER EXTREMITY ROM:     wfl  LOWER EXTREMITY MMT:    MMT Right Eval Left Eval  Hip flexion 4+ 4+  Hip extension     Hip abduction 5 5  Hip adduction    Hip internal rotation    Hip external rotation    Knee flexion    Knee extension 5 5  Ankle dorsiflexion 5 5  Ankle plantarflexion    Ankle inversion    Ankle eversion    (Blank rows = not tested)    TRANSFERS: IND   GAIT: Gait pattern: step through pattern, decreased stance time- Left, decreased stride length, scissoring, trendelenburg, and narrow BOS Distance walked: 294 Assistive device utilized: None Level of assistance: Modified independence Comments: Intermittent stagger towards right side, intermittent scissoring, able to self correct  FUNCTIONAL TESTs:  5 times sit to stand: 13.6 seconds 2 minute walk test: 294 feet no AD Berg Balance Scale: 41/56    BERG  1. SITTING TO STANDING  4 able to stand without using hands and stabilize independently  2. STANDING UNSUPPORTED  4 able to stand safely for 2 minutes  If a subject is able to stand 2 minutes unsupported, score full points for sitting unsupported. Proceed to item #4  3. SITTING WITH BACK UNSUPPORTED BUT FEET SUPPORTED ON FLOOR OR ON A STOOL 4 able to sit safely and securely for 2 minutes  4. STANDING TO SITTING 4 sits safely with minimal use of hands  5. TRANSFERS 4 able to transfer safely with minor use of hands  6. STANDING UNSUPPORTED WITH EYES CLOSED  3 able to stand 10 seconds with supervision  7. STANDING UNSUPPORTED WITH FEET TOGETHER  3 able to place feet together independently and stand 1 minute with supervision  8. REACHING FORWARD WITH OUTSTRETCHED ARM WHILE STANDING  3 can reach forward 12 cm (5 inches)  9. PICK UP OBJECT FROM THE FLOOR FROM A STANDING POSITION  3 able to pick up slipper but needs supervision  10. TURNING TO LOOK BEHIND OVER LEFT AND RIGHT SHOULDERS WHILE STANDING 1 needs supervision when turning  11. TURN 360 DEGREES 1 needs close supervision or verbal cuing  12. PLACE ALTERNATE FOOT ON STEP OR STOOL WHILE STANDING  UNSUPPORTED 2 able to complete 4 steps without aid with supervision  13. STANDING UNSUPPORTED ONE FOOT IN FRONT  3 able to place foot ahead independently and hold 30 seconds   14. STANDING ON ONE LEG 2 able to lift leg independently and hold = 3 seconds  TOTAL SCORE  41/56 History of falls and BBS < 51, or no history of falls and BBS < 42 is predictive of falls ([place], 1997    Orthostatics: (03/18/22) Supine 154/69 HR69 Seated 142/69 HR71 Standing 150/70 HR 73 Standing at 3 minutes 145/72 HR 70  Dix-hallpike test: (03/18/22) Negative Rt, Rt beating nystagmus in Lt dix-hallpike position <30 second, very small amplitude, no rotational component, +dizziness, very mild  Horizontal roll test: (03/18/22) apogeotropic nystagums in left sidelying <15 seconds, very small amplitude     PATIENT SURVEYS:   DHI Total Score: 34 / 100 Physical Score: 8 / 28 Emotional Score: 8 / 36 Functional Score: 18 / 36  TODAY'S TREATMENT:  03/20/22 HR 1x 10  TR 1 x 10  Alternating march 2x 10 bilateral  Step up 6 inch 2 x 10 bilateral  Tandem stance 2x 30 second holds bilateral  SLS x 30 seconds bilateral  Cone taps - 3 small orange domes 10 RT Lateral stepping 3x 15 feet bilateral  Gait with head turns - 2 x 40 feet vertical, 2 x 40 feet horizontal STS 1x 10   03/18/22 Orthostatics taken Horizontal roll and dix-hallpike  performed Education HEP performed, reviewed, and handout provided   Eval 5x sit to stand 2MWT BERG Special tests Education   PATIENT EDUCATION: Education details: 03/20/22: HEP; EVAL: Findings, safety, POC Person educated: Patient Education method: Explanation Education comprehension: verbalized understanding   HOME EXERCISE PROGRAM: Access Code: [email or web address] URL: [email or web address] - Side Stepping with Counter Support  - 3 x daily - 7 x weekly - 4 sets - 10 reps - Single Leg Stance with Support  - 3 x daily - 7 x weekly - 3 reps - 30  second hold  Date: 03/18/2022 Prepared by: Candie Mile  Exercises - Standing Tandem Balance with Counter Support  - 3 x daily - 7 x weekly - 3 sets - 15 seconds hold - Standing March with Counter Support  - 3 x daily - 7 x weekly - 3 sets - 10 reps - Heel Toe Raises with Counter Support  - 3 x daily - 7 x weekly - 3 sets - 10 reps - Gaze Stabilty (VOR) x1 Feet Together on Firm Ground With Horizontal Head Turns  - 3 x daily - 7 x weekly - 3 sets - 30 seconds hold    GOALS: Goals reviewed with patient? Yes  SHORT TERM GOALS: Target date: 04/03/22  Patient will be independent with initial HEP and self-management strategies to improve functional outcomes Baseline:  Goal status: INITIAL    LONG TERM GOALS: Target date: 04/24/22  Patient will be independent with advanced HEP and self-management strategies to improve functional outcomes Baseline:  Goal status: INITIAL  2.  Patient will reduce Dizziness Handicap Inventory score by at least 4 points to indicate improvement in functional outcomes. (([place]). A four-point change in the DHI-S would be statistically significant at the p = 0.05 level) Baseline: 34 / 100 Goal status: INITIAL  3.  Patient will reduce fall risk based on BERG balance score 51 or greater (in patients with hx of falls. Baseline: 41/56 Goal status: INITIAL  4. 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 Baseline: Reports 80% back to baseline Goal status: INITIAL  5. Patient will be able to ambulate at least 325 feet during 2MWT with LRAD to demonstrate improved ability to perform functional mobility and associated tasks. Baseline: 294 feet no AD Goal status: INITIAL   ASSESSMENT:  CLINICAL IMPRESSION: Began functional strengthening and balance training which is tolerated well. Patient with intermittent unsteadiness  requiring 1 HHA occasionally. Min/mod sway with tandem stance. Patient given SBA for most exercises performed for balance/safety. Patient will continue to benefit from physical therapy in order to improve function and reduce impairment.     OBJECTIVE IMPAIRMENTS Abnormal gait, decreased activity tolerance, decreased balance, decreased endurance, decreased knowledge of condition, decreased mobility, difficulty walking, and dizziness.   ACTIVITY LIMITATIONS carrying, standing, stairs, and locomotion level  PARTICIPATION LIMITATIONS: meal prep, cleaning, laundry, shopping, community activity, occupation, and yard work  PERSONAL FACTORS Age and Time since onset of injury/illness/exacerbation are also affecting patient's functional outcome.   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 Reassess with dix-hallpike and horizontal roll test if dizziness returns.  9:01 AM, 03/20/22 Mearl Latin PT, DPT Physical Therapist at [facility]

## 2022-03-25 ENCOUNTER — Encounter (HOSPITAL_COMMUNITY): Payer: Self-pay | Admitting: Physical Therapy

## 2022-03-25 ENCOUNTER — Ambulatory Visit (HOSPITAL_COMMUNITY): Payer: Medicare HMO | Attending: Neurology | Admitting: Physical Therapy

## 2022-03-25 DIAGNOSIS — R262 Difficulty in walking, not elsewhere classified: Secondary | ICD-10-CM | POA: Insufficient documentation

## 2022-03-25 DIAGNOSIS — R2689 Other abnormalities of gait and mobility: Secondary | ICD-10-CM | POA: Diagnosis present

## 2022-03-25 DIAGNOSIS — R296 Repeated falls: Secondary | ICD-10-CM | POA: Diagnosis present

## 2022-03-25 NOTE — Therapy (Signed)
OUTPATIENT PHYSICAL THERAPY NEURO TREATMENT   Patient Name: Kayla Thomas MRN: [id number] DOB:April 28, 1941, 81 y.o., female Today's Date: 03/25/2022   PCP: Asencion Noble, MD REFERRING PROVIDER: Phillips Odor, MD    PT End of Session - 03/25/22 1600     Visit Number 4    Number of Visits 12    Date for PT Re-Evaluation 04/24/22    Authorization Type [employer] HMO (no auth, no VL)    Progress Note Due on Visit 10    PT Start Time 1600    PT Stop Time 0623    PT Time Calculation (min) 44 min    Activity Tolerance Patient tolerated treatment well    Behavior During Therapy 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   Status post vitrectomy March 2021 revealed resolved VM traction   Wears partial dentures    Past Surgical History:  Procedure Laterality Date   ABDOMINAL HYSTERECTOMY     ABSCESS DRAINAGE  03-04-05   insect bite   CARDIAC CATHETERIZATION N/A 10/31/2014   Procedure: Right/Left Heart Cath and Coronary Angiography;  Surgeon: Troy Sine, MD;  Location: [place] [facility];  Service: Cardiovascular;  Laterality: N/A;   CATARACT EXTRACTION W/PHACO  10/06/2011   Procedure: CATARACT EXTRACTION PHACO AND INTRAOCULAR LENS PLACEMENT (IOC);  Surgeon: Williams Che, MD;  Location: [facility];  Service: Ophthalmology;  Laterality: Left;  CDE:  5.76   cataracts     CHOLECYSTECTOMY  11/1999   COLONOSCOPY  05/13/2011   Procedure: COLONOSCOPY;  Surgeon: Dorothyann Peng, MD;  Location: [facility];  Service: Endoscopy;  Laterality: N/A;  8:30   COLONOSCOPY  N/A 04/09/2017   Dr. Oneida Alar: single tubular adenoma removed from the colon, redundant left colon, internal and external hemorrhoids, next colonoscopy in 10 to 15 years.   ESOPHAGOGASTRODUODENOSCOPY  01/21/06   mild antral erythema bx h-pylori/normal esophagus without evidence of mass or Barrett's/normal pylorus and duodenum   PARTIAL MASTECTOMY WITH NEEDLE LOCALIZATION AND AXILLARY SENTINEL LYMPH NODE BX Left 08/18/2012   Procedure: PARTIAL MASTECTOMY WITH NEEDLE LOCALIZATION AND AXILLARY SENTINEL LYMPH NODE BX;  Surgeon: Jamesetta So, MD;  Location: [facility];  Service: General;  Laterality: Left;  Need Frozen Section/Sentinel Node Bx @ 8:00am/Needle Loc @ 9:00am   POLYPECTOMY  04/09/2017   Procedure: POLYPECTOMY;  Surgeon: Danie Binder, MD;  [facility]: [facility];  Service: Endoscopy;;  Rectal   S/P Hysterectomy  1998   uterine ca   temperal/mandibular  [place] Right 05/04/2014   Procedure: RELEASE TRIGGER FINGER/A-1 PULLEY RIGHT RING FINGER;  Surgeon: Daryll Brod, MD;  Location: [place];  Service: Orthopedics;  Laterality: Right;   Patient Active Problem List   Diagnosis Date Noted   History of vitrectomy 12/05/2021   Intermediate stage nonexudative age-related macular degeneration of left eye 05/21/2021   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  Dry eyes, bilateral 05/03/2020   Follow-up examination after eye surgery 10/19/2019   Advanced nonexudative age-related macular degeneration of right eye with subfoveal involvement 10/19/2019   Polyp of rectum    Vulval lesion benign, excised 12/22/2016   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   Closed nondisplaced fracture of distal phalanx of right middle finger 02/27/2016   Breast cancer ([place]) 04/30/2015   Abnormal ECG    DOE (dyspnea on exertion) 04/12/2014   MIGRAINE HEADACHE 01/23/2009   GERD 01/23/2009   GASTRITIS 01/23/2009   LOOSE STOOLS  01/23/2009   ALLERGY 01/23/2009   UTERINE CANCER, HX OF 01/23/2009    ONSET DATE: approx 2.5 months ago  REFERRING DIAG: PT eval/tx for gait training per Phillips Odor, MD   THERAPY DIAG:  Difficulty in walking, not elsewhere classified  Repeated falls  Other abnormalities of gait and mobility  Rationale for Evaluation and Treatment Rehabilitation  SUBJECTIVE: Reports she has returned to work at [employer] class. Feels it is going very well but still notices issues with balance and having trouble doing yard work. No episodes of dizziness.                                                                                                                                                                                             PAIN:  Are you having pain? No  PRECAUTIONS: None  WEIGHT BEARING RESTRICTIONS No  FALLS: Has patient fallen in last 6 months? Yes. 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   MRI IMPRESSION: 1. No acute intracranial abnormality. 2. Mild chronic microvascular ischemic changes of the white matter.    COGNITION: Overall cognitive status: Within functional limits for tasks assessed   SENSATION: WFL  COORDINATION: FNF and Heel knee shin WNL   DTRs:  Biceps 2+ = Normal, Brachioradialis 2+ = Normal, Triceps 1 = Trace , and Patella 2+ = Normal  Hoffman's absent, no tone or clonus noted Head impulse test + Left   LOWER EXTREMITY ROM:     wfl  LOWER EXTREMITY MMT:    MMT  Right Eval Left Eval  Hip flexion 4+ 4+  Hip extension  Hip abduction 5 5  Hip adduction    Hip internal rotation    Hip external rotation    Knee flexion    Knee extension 5 5  Ankle dorsiflexion 5 5  Ankle plantarflexion    Ankle inversion    Ankle eversion    (Blank rows = not tested)    TRANSFERS: IND   GAIT: Gait pattern: step through pattern, decreased stance time- Left, decreased stride length, scissoring, trendelenburg, and narrow BOS Distance walked: 294 Assistive device utilized: None Level of assistance: Modified independence Comments: Intermittent stagger towards right side, intermittent scissoring, able to self correct  FUNCTIONAL TESTs:  5 times sit to stand: 13.6 seconds 2 minute walk test: 294 feet no AD Berg Balance Scale: 41/56    BERG  1. SITTING TO STANDING  4 able to stand without using hands and stabilize independently  2. STANDING UNSUPPORTED  4 able to stand safely for 2 minutes  If a subject is able to stand 2 minutes unsupported, score full points for sitting unsupported. Proceed to item #4  3. SITTING WITH BACK UNSUPPORTED BUT FEET SUPPORTED ON FLOOR OR ON A STOOL 4 able to sit safely and securely for 2 minutes  4. STANDING TO SITTING 4 sits safely with minimal use of hands  5. TRANSFERS 4 able to transfer safely with minor use of hands  6. STANDING UNSUPPORTED WITH EYES CLOSED  3 able to stand 10 seconds with supervision  7. STANDING UNSUPPORTED WITH FEET TOGETHER  3 able to place feet together independently and stand 1 minute with supervision  8. REACHING FORWARD WITH OUTSTRETCHED ARM WHILE STANDING  3 can reach forward 12 cm (5 inches)  9. PICK UP OBJECT FROM THE FLOOR FROM A STANDING POSITION  3 able to pick up slipper but needs supervision  10. TURNING TO LOOK BEHIND OVER LEFT AND RIGHT SHOULDERS WHILE STANDING 1 needs supervision when turning  11. TURN 360 DEGREES 1 needs close supervision or verbal cuing  12.  PLACE ALTERNATE FOOT ON STEP OR STOOL WHILE STANDING UNSUPPORTED 2 able to complete 4 steps without aid with supervision  13. STANDING UNSUPPORTED ONE FOOT IN FRONT  3 able to place foot ahead independently and hold 30 seconds   14. STANDING ON ONE LEG 2 able to lift leg independently and hold = 3 seconds  TOTAL SCORE  41/56 History of falls and BBS < 51, or no history of falls and BBS < 42 is predictive of falls ([place], 1997    Orthostatics: (03/18/22) Supine 154/69 HR69 Seated 142/69 HR71 Standing 150/70 HR 73 Standing at 3 minutes 145/72 HR 70  Dix-hallpike test: (03/18/22) Negative Rt, Rt beating nystagmus in Lt dix-hallpike position <30 second, very small amplitude, no rotational component, +dizziness, very mild  Horizontal roll test: (03/18/22) apogeotropic nystagums in left sidelying <15 seconds, very small amplitude     PATIENT SURVEYS:   DHI Total Score: 34 / 100 Physical Score: 8 / 28 Emotional Score: 8 / 36 Functional Score: 18 / 36  TODAY'S TREATMENT:  03/25/22 Heel toe rocks 3x10 Alternating march 3x 10 bilateral  6" box retro toe tap 2x10 bilateral 6: box lateral toe tap 2x10 bilateral Step up 6 inch 2 x 10 bilateral (no UE assist) Tandem stance 2x 30 second holds bilateral  SLS 3x 15 seconds bilateral (toe touch to correct) Cone taps - 3 small orange domes 2x5 RT ea Lateral stepping 3x 15 feet bilateral  Navigating cones with sharp turns along blue  line x 4 RT Gait with head turns - 2 x 40 feet vertical, 2 x 40 feet horizontal Standing on foam WBOS with PNF reaching x10 ea Standing on foam NBOS 3x30" Sit to stand x10 no UE support  03/20/22 HR 1x 10  TR 1 x 10  Alternating march 2x 10 bilateral  Step up 6 inch 2 x 10 bilateral  Tandem stance 2x 30 second holds bilateral  SLS x 30 seconds bilateral  Cone taps - 3 small orange domes 10 RT Lateral stepping 3x 15 feet bilateral  Gait with head turns - 2 x 40 feet vertical, 2 x 40 feet  horizontal STS 1x 10   03/18/22 Orthostatics taken Horizontal roll and dix-hallpike performed Education HEP performed, reviewed, and handout provided   Eval 5x sit to stand 2MWT BERG Special tests Education   PATIENT EDUCATION: Education details: 03/20/22: HEP; EVAL: Findings, safety, POC Person educated: Patient Education method: Explanation Education comprehension: verbalized understanding   HOME EXERCISE PROGRAM: Access Code: [email or web address] URL: [email or web address] - Side Stepping with Counter Support  - 3 x daily - 7 x weekly - 4 sets - 10 reps - Single Leg Stance with Support  - 3 x daily - 7 x weekly - 3 reps - 30 second hold  Date: 03/18/2022 Prepared by: Candie Mile  Exercises - Standing Tandem Balance with Counter Support  - 3 x daily - 7 x weekly - 3 sets - 15 seconds hold - Standing March with Counter Support  - 3 x daily - 7 x weekly - 3 sets - 10 reps - Heel Toe Raises with Counter Support  - 3 x daily - 7 x weekly - 3 sets - 10 reps - Gaze Stabilty (VOR) x1 Feet Together on Firm Ground With Horizontal Head Turns  - 3 x daily - 7 x weekly - 3 sets - 30 seconds hold    GOALS: Goals reviewed with patient? Yes  SHORT TERM GOALS: Target date: 04/03/22  Patient will be independent with initial HEP and self-management strategies to improve functional outcomes Baseline:  Goal status: INITIAL    LONG TERM GOALS: Target date: 04/24/22  Patient will be independent with advanced HEP and self-management strategies to improve functional outcomes Baseline:  Goal status: INITIAL  2.  Patient will reduce Dizziness Handicap Inventory score by at least 4 points to indicate improvement in functional outcomes. (([place]). A four-point change in the DHI-S would be statistically significant at the p = 0.05 level) Baseline: 34 / 100 Goal status: INITIAL  3.  Patient will reduce fall risk based on BERG balance score 51 or greater (in  patients with hx of falls. Baseline: 41/56 Goal status: INITIAL  4. 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 Baseline: Reports 80% back to baseline Goal status: INITIAL  5. Patient will be able to ambulate at least 325 feet during 2MWT with LRAD to demonstrate improved ability to perform functional mobility and associated tasks. Baseline: 294 feet no AD Goal status: INITIAL   ASSESSMENT:  CLINICAL IMPRESSION: Further progression with functional exercise and balance training. Increasing challenges that require NBOS and SLS periods - shows moderate difficulty with retro box tap, intermittent HHA on rails within // bars. SBA-supervision for safety throughout sessions. Staggering intermittently with head turns during forward walking but able to self correct, SBA for safety.  OBJECTIVE IMPAIRMENTS Abnormal gait, decreased activity tolerance, decreased balance, decreased endurance, decreased knowledge of condition, decreased mobility, difficulty walking, and dizziness.   ACTIVITY LIMITATIONS carrying, standing, stairs, and locomotion level  PARTICIPATION LIMITATIONS: meal prep, cleaning, laundry, shopping, community activity, occupation, and yard work  PERSONAL FACTORS Age and Time since onset of injury/illness/exacerbation are also affecting patient's functional outcome.   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 Reassess with dix-hallpike and horizontal roll test if dizziness returns.  4:46 PM, 03/25/22 Candie Mile, PT, DPT Physical Therapist [facility]

## 2022-03-27 ENCOUNTER — Encounter (HOSPITAL_COMMUNITY): Payer: Self-pay | Admitting: Physical Therapy

## 2022-03-27 ENCOUNTER — Ambulatory Visit (HOSPITAL_COMMUNITY): Payer: Medicare HMO | Admitting: Physical Therapy

## 2022-03-27 DIAGNOSIS — R296 Repeated falls: Secondary | ICD-10-CM

## 2022-03-27 DIAGNOSIS — R262 Difficulty in walking, not elsewhere classified: Secondary | ICD-10-CM | POA: Diagnosis not present

## 2022-03-27 DIAGNOSIS — R2689 Other abnormalities of gait and mobility: Secondary | ICD-10-CM

## 2022-03-27 NOTE — Therapy (Signed)
OUTPATIENT PHYSICAL THERAPY NEURO TREATMENT   Patient Name: Kayla Thomas MRN: [id number] DOB:04-14-1941, 81 y.o., female Today's Date: 03/27/2022   PCP: Asencion Noble, MD REFERRING PROVIDER: Phillips Odor, MD    PT End of Session - 03/27/22 1215     Visit Number 5    Number of Visits 12    Date for PT Re-Evaluation 04/24/22    Authorization Type [employer] HMO (no auth, no VL)    Progress Note Due on Visit 10    PT Start Time 1127    PT Stop Time 7322    PT Time Calculation (min) 38 min    Activity Tolerance Patient tolerated treatment well    Behavior During Therapy [facility] for tasks assessed/performed              Past Medical History:  Diagnosis Date   Breast cancer ([place])    Breast cancer ([place])    Chronic back pain    Gastric ulcer    History   GERD (gastroesophageal reflux disease) 01/21/2006   EGD Dr Oneida Alar mild chronic gastritis, (NO h pylori) otherwise normal   Headache(784.0)    Sinus congestion    Thumb tendonitis September 2014   left   Uterine cancer ([place]) 1998   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   Status post vitrectomy March 2021 revealed resolved VM traction   Wears partial dentures    Past Surgical History:  Procedure Laterality Date   ABDOMINAL HYSTERECTOMY     ABSCESS DRAINAGE  03-04-05   insect bite   CARDIAC CATHETERIZATION N/A 10/31/2014   Procedure: Right/Left Heart Cath and Coronary Angiography;  Surgeon: Troy Sine, MD;  Location: [facility];  Service: Cardiovascular;  Laterality: N/A;   CATARACT EXTRACTION W/PHACO  10/06/2011   Procedure: CATARACT EXTRACTION PHACO AND INTRAOCULAR LENS PLACEMENT (IOC);  Surgeon: Williams Che, MD;  Location: [facility];  Service: Ophthalmology;  Laterality: Left;  CDE:  5.76   cataracts     CHOLECYSTECTOMY  11/1999   COLONOSCOPY  05/13/2011   Procedure: COLONOSCOPY;  Surgeon: Dorothyann Peng, MD;  Location: [facility];  Service: Endoscopy;  Laterality: N/A;  8:30    COLONOSCOPY N/A 04/09/2017   Dr. Oneida Alar: single tubular adenoma removed from the colon, redundant left colon, internal and external hemorrhoids, next colonoscopy in 10 to 15 years.   ESOPHAGOGASTRODUODENOSCOPY  01/21/06   mild antral erythema bx h-pylori/normal esophagus without evidence of mass or Barrett's/normal pylorus and duodenum   PARTIAL MASTECTOMY WITH NEEDLE LOCALIZATION AND AXILLARY SENTINEL LYMPH NODE BX Left 08/18/2012   Procedure: PARTIAL MASTECTOMY WITH NEEDLE LOCALIZATION AND AXILLARY SENTINEL LYMPH NODE BX;  Surgeon: Jamesetta So, MD;  Location: [facility];  Service: General;  Laterality: Left;  Need Frozen Section/Sentinel Node Bx @ 8:00am/Needle Loc @ 9:00am   POLYPECTOMY  04/09/2017   Procedure: POLYPECTOMY;  Surgeon: Danie Binder, MD;  [facility]: [facility];  Service: Endoscopy;;  Rectal   S/P Hysterectomy  1998   uterine ca   temperal/mandibular  [place] Right 05/04/2014   Procedure: RELEASE TRIGGER FINGER/A-1 PULLEY RIGHT RING FINGER;  Surgeon: Daryll Brod, MD;  Location: [facility];  Service: Orthopedics;  Laterality: Right;   Patient Active Problem List   Diagnosis Date Noted   History of vitrectomy 12/05/2021   Intermediate stage nonexudative age-related macular degeneration of left eye 05/21/2021   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  Dry eyes, bilateral 05/03/2020   Follow-up examination after eye surgery 10/19/2019   Advanced nonexudative age-related macular degeneration of right eye with subfoveal involvement 10/19/2019   Polyp of rectum    Vulval lesion benign, excised 12/22/2016   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   Closed nondisplaced fracture of distal phalanx of right middle finger 02/27/2016   Breast cancer ([place]) 04/30/2015   Abnormal ECG    DOE (dyspnea on exertion) 04/12/2014   MIGRAINE HEADACHE 01/23/2009   GERD 01/23/2009   GASTRITIS 01/23/2009   LOOSE  STOOLS 01/23/2009   ALLERGY 01/23/2009   UTERINE CANCER, HX OF 01/23/2009    ONSET DATE: approx 2.5 months ago  REFERRING DIAG: PT eval/tx for gait training per Phillips Odor, MD   THERAPY DIAG:  Difficulty in walking, not elsewhere classified  Repeated falls  Other abnormalities of gait and mobility  Rationale for Evaluation and Treatment Rehabilitation  SUBJECTIVE: Pt states that she is doing her exercises. The only time she felt dizzy is yesterday morning when she got up.                                                                                                                                                                PAIN:  Are you having pain? No  PRECAUTIONS: None  WEIGHT BEARING RESTRICTIONS No  FALLS: Has patient fallen in last 6 months? Yes. 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   MRI IMPRESSION: 1. No acute intracranial abnormality. 2. Mild chronic microvascular ischemic changes of the white matter.    COGNITION: Overall cognitive status: Within functional limits for tasks assessed   SENSATION: WFL  COORDINATION: FNF and Heel knee shin WNL   DTRs:  Biceps 2+ = Normal, Brachioradialis 2+ = Normal, Triceps 1 = Trace , and Patella 2+ = Normal  Hoffman's absent, no tone or clonus noted Head impulse test + Left   LOWER EXTREMITY ROM:     wfl  LOWER EXTREMITY MMT:    MMT Right Eval Left Eval  Hip flexion 4+ 4+  Hip extension    Hip abduction 5 5  Hip adduction    Hip internal rotation     Hip external rotation    Knee flexion    Knee extension 5 5  Ankle dorsiflexion 5 5  Ankle plantarflexion  Ankle inversion    Ankle eversion    (Blank rows = not tested)    TRANSFERS: IND   GAIT: Gait pattern: step through pattern, decreased stance time- Left, decreased stride length, scissoring, trendelenburg, and narrow BOS Distance walked: 294 Assistive device utilized: None Level of assistance: Modified independence Comments: Intermittent stagger towards right side, intermittent scissoring, able to self correct  FUNCTIONAL TESTs:  5 times sit to stand: 13.6 seconds 2 minute walk test: 294 feet no AD Berg Balance Scale: 41/56    BERG  1. SITTING TO STANDING  4 able to stand without using hands and stabilize independently  2. STANDING UNSUPPORTED  4 able to stand safely for 2 minutes  If a subject is able to stand 2 minutes unsupported, score full points for sitting unsupported. Proceed to item #4  3. SITTING WITH BACK UNSUPPORTED BUT FEET SUPPORTED ON FLOOR OR ON A STOOL 4 able to sit safely and securely for 2 minutes  4. STANDING TO SITTING 4 sits safely with minimal use of hands  5. TRANSFERS 4 able to transfer safely with minor use of hands  6. STANDING UNSUPPORTED WITH EYES CLOSED  3 able to stand 10 seconds with supervision  7. STANDING UNSUPPORTED WITH FEET TOGETHER  3 able to place feet together independently and stand 1 minute with supervision  8. REACHING FORWARD WITH OUTSTRETCHED ARM WHILE STANDING  3 can reach forward 12 cm (5 inches)  9. PICK UP OBJECT FROM THE FLOOR FROM A STANDING POSITION  3 able to pick up slipper but needs supervision  10. TURNING TO LOOK BEHIND OVER LEFT AND RIGHT SHOULDERS WHILE STANDING 1 needs supervision when turning  11. TURN 360 DEGREES 1 needs close supervision or verbal cuing  12. PLACE ALTERNATE FOOT ON STEP OR STOOL WHILE STANDING UNSUPPORTED 2 able to complete 4 steps without aid with  supervision  13. STANDING UNSUPPORTED ONE FOOT IN FRONT  3 able to place foot ahead independently and hold 30 seconds   14. STANDING ON ONE LEG 2 able to lift leg independently and hold = 3 seconds  TOTAL SCORE  41/56 History of falls and BBS < 51, or no history of falls and BBS < 42 is predictive of falls ([place], 1997    Orthostatics: (03/18/22) Supine 154/69 HR69 Seated 142/69 HR71 Standing 150/70 HR 73 Standing at 3 minutes 145/72 HR 70  Dix-hallpike test: (03/18/22) Negative Rt, Rt beating nystagmus in Lt dix-hallpike position <30 second, very small amplitude, no rotational component, +dizziness, very mild  Horizontal roll test: (03/18/22) apogeotropic nystagums in left sidelying <15 seconds, very small amplitude     PATIENT SURVEYS:   DHI Total Score: 34 / 100 Physical Score: 8 / 28 Emotional Score: 8 / 36 Functional Score: 18 / 36  TODAY'S TREATMENT:            10/5 /23:           Standing:  wall arch x 10                            Back to wall with UE flexion B x 10                             Marching x 10  Tandem stance x 5                             Side stepping with green thera-band x 2 RT                            Vector stance x B x 3             Pt has complaint of dizziness with Lt dix-hallpike but no noted nystagmus. Eply maneuver completed x 2  03/25/22 Heel toe rocks 3x10 Alternating march 3x 10 bilateral  6" box retro toe tap 2x10 bilateral 6: box lateral toe tap 2x10 bilateral Step up 6 inch 2 x 10 bilateral (no UE assist) Tandem stance 2x 30 second holds bilateral  SLS 3x 15 seconds bilateral (toe touch to correct) Cone taps - 3 small orange domes 2x5 RT ea Lateral stepping 3x 15 feet bilateral  Navigating cones with sharp turns along blue line x 4 RT Gait with head turns - 2 x 40 feet vertical, 2 x 40 feet horizontal Standing on foam WBOS with PNF reaching x10 ea Standing on foam NBOS 3x30" Sit to stand  x10 no UE support  03/20/22 HR 1x 10  TR 1 x 10  Alternating march 2x 10 bilateral  Step up 6 inch 2 x 10 bilateral  Tandem stance 2x 30 second holds bilateral  SLS x 30 seconds bilateral  Cone taps - 3 small orange domes 10 RT Lateral stepping 3x 15 feet bilateral  Gait with head turns - 2 x 40 feet vertical, 2 x 40 feet horizontal STS 1x 10    PATIENT EDUCATION: Education details: 03/20/22: HEP; EVAL: Findings, safety, POC Person educated: Patient Education method: Explanation Education comprehension: verbalized understanding   HOME EXERCISE PROGRAM: Access Code: [email or web address] URL: [email or web address] - Side Stepping with Counter Support  - 3 x daily - 7 x weekly - 4 sets - 10 reps - Single Leg Stance with Support  - 3 x daily - 7 x weekly - 3 reps - 30 second hold  Date: 03/18/2022 Prepared by: Candie Mile  Exercises - Standing Tandem Balance with Counter Support  - 3 x daily - 7 x weekly - 3 sets - 15 seconds hold - Standing March with Counter Support  - 3 x daily - 7 x weekly - 3 sets - 10 reps - Heel Toe Raises with Counter Support  - 3 x daily - 7 x weekly - 3 sets - 10 reps - Gaze Stabilty (VOR) x1 Feet Together on Firm Ground With Horizontal Head Turns  - 3 x daily - 7 x weekly - 3 sets - 30 seconds hold    GOALS: Goals reviewed with patient? Yes  SHORT TERM GOALS: Target date: 04/03/22  Patient will be independent with initial HEP and self-management strategies to improve functional outcomes Baseline:  Goal status: IN PROGRESS    LONG TERM GOALS: Target date: 04/24/22  Patient will be independent with advanced HEP and self-management strategies to improve functional outcomes Baseline:  Goal status: IN PROGRESS  2.  Patient will reduce Dizziness Handicap Inventory score by at least 4 points to indicate improvement in functional outcomes. (([place]). A four-point change in the DHI-S would be statistically significant at the  p = 0.05 level) Baseline: 34 / 100 Goal status: IN PROGRESS  3.  Patient will reduce  fall risk based on BERG balance score 51 or greater (in patients with hx of falls. Baseline: 41/56 Goal status: IN PROGRESS  4. 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 Baseline: Reports 80% back to baseline Goal status: IN PROGRESS  5. Patient will be able to ambulate at least 325 feet during 2MWT with LRAD to demonstrate improved ability to perform functional mobility and associated tasks. Baseline: 294 feet no AD Goal status: IN PROGRESS   ASSESSMENT:  CLINICAL IMPRESSION: Therapist added standing balance activity with min assist needed for saftey.  Due to complaints of dizziness therapist completed eply's maneuver.     OBJECTIVE IMPAIRMENTS Abnormal gait, decreased activity tolerance, decreased balance, decreased endurance, decreased knowledge of condition, decreased mobility, difficulty walking, and dizziness.   ACTIVITY LIMITATIONS carrying, standing, stairs, and locomotion level  PARTICIPATION LIMITATIONS: meal prep, cleaning, laundry, shopping, community activity, occupation, and yard work  PERSONAL FACTORS Age and Time since onset of injury/illness/exacerbation are also affecting patient's functional outcome.   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 Reassess with dix-hallpike and horizontal roll test if dizziness returns.  Rayetta Humphrey, [place] CLT [phone]  12:05

## 2022-04-01 ENCOUNTER — Ambulatory Visit (HOSPITAL_COMMUNITY): Payer: Medicare HMO | Admitting: Physical Therapy

## 2022-04-01 DIAGNOSIS — R262 Difficulty in walking, not elsewhere classified: Secondary | ICD-10-CM | POA: Diagnosis not present

## 2022-04-01 DIAGNOSIS — R2689 Other abnormalities of gait and mobility: Secondary | ICD-10-CM

## 2022-04-01 DIAGNOSIS — R296 Repeated falls: Secondary | ICD-10-CM

## 2022-04-01 NOTE — Therapy (Signed)
OUTPATIENT PHYSICAL THERAPY NEURO TREATMENT   Patient Name: Kayla Thomas MRN: [id number] DOB:[birth year], 81 y.o., female Today's Date: 04/01/2022   PCP: Asencion Noble, MD REFERRING PROVIDER: Phillips Odor, MD    PT End of Session - 04/01/22 1439     Visit Number 6    Number of Visits 12    Date for PT Re-Evaluation 04/24/22    Authorization Type [employer] HMO (no auth, no VL)    Progress Note Due on Visit 10    PT Start Time 0350    PT Stop Time 0938    PT Time Calculation (min) 38 min    Activity Tolerance Patient tolerated treatment well    Behavior During Therapy [facility] for tasks assessed/performed              Past Medical History:  Diagnosis Date   Breast cancer ([place])    Breast cancer ([place])    Chronic back pain    Gastric ulcer    History   GERD (gastroesophageal reflux disease) 01/21/2006   EGD Dr Oneida Alar mild chronic gastritis, (NO h pylori) otherwise normal   Headache(784.0)    Sinus congestion    Thumb tendonitis September 2014   left   Uterine cancer ([place]) 1998   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   Status post vitrectomy March 2021 revealed resolved VM traction   Wears partial dentures    Past Surgical History:  Procedure Laterality Date   ABDOMINAL HYSTERECTOMY     ABSCESS DRAINAGE  03-04-05   insect bite   CARDIAC CATHETERIZATION N/A 10/31/2014   Procedure: Right/Left Heart Cath and Coronary Angiography;  Surgeon: Troy Sine, MD;  Location: [place] [facility];  Service: Cardiovascular;  Laterality: N/A;   CATARACT EXTRACTION W/PHACO  10/06/2011   Procedure: CATARACT EXTRACTION PHACO AND INTRAOCULAR LENS PLACEMENT (IOC);  Surgeon: Williams Che, MD;  Location: [facility];  Service: Ophthalmology;  Laterality: Left;  CDE:  5.76   cataracts     CHOLECYSTECTOMY  11/1999   COLONOSCOPY  05/13/2011   Procedure: COLONOSCOPY;  Surgeon: Dorothyann Peng, MD;  Location: [facility];  Service: Endoscopy;  Laterality: N/A;  8:30    COLONOSCOPY N/A 04/09/2017   Dr. Oneida Alar: single tubular adenoma removed from the colon, redundant left colon, internal and external hemorrhoids, next colonoscopy in 10 to 15 years.   ESOPHAGOGASTRODUODENOSCOPY  01/21/06   mild antral erythema bx h-pylori/normal esophagus without evidence of mass or Barrett's/normal pylorus and duodenum   PARTIAL MASTECTOMY WITH NEEDLE LOCALIZATION AND AXILLARY SENTINEL LYMPH NODE BX Left 08/18/2012   Procedure: PARTIAL MASTECTOMY WITH NEEDLE LOCALIZATION AND AXILLARY SENTINEL LYMPH NODE BX;  Surgeon: Jamesetta So, MD;  Location: [facility];  Service: General;  Laterality: Left;  Need Frozen Section/Sentinel Node Bx @ 8:00am/Needle Loc @ 9:00am   POLYPECTOMY  04/09/2017   Procedure: POLYPECTOMY;  Surgeon: Danie Binder, MD;  Location: [facility];  Service: Endoscopy;;  Rectal   S/P Hysterectomy  1998   uterine ca   temperal/mandibular  [place] Right 05/04/2014   Procedure: RELEASE TRIGGER FINGER/A-1 PULLEY RIGHT RING FINGER;  Surgeon: Daryll Brod, MD;  Location: [place];  Service: Orthopedics;  Laterality: Right;   Patient Active Problem List   Diagnosis Date Noted   History of vitrectomy 12/05/2021   Intermediate stage nonexudative age-related macular degeneration of left eye 05/21/2021   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  Dry eyes, bilateral 05/03/2020   Follow-up examination after eye surgery 10/19/2019   Advanced nonexudative age-related macular degeneration of right eye with subfoveal involvement 10/19/2019   Polyp of rectum    Vulval lesion benign, excised 12/22/2016   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   Closed nondisplaced fracture of distal phalanx of right middle finger 02/27/2016   Breast cancer ([place]) 04/30/2015   Abnormal ECG    DOE (dyspnea on exertion) 04/12/2014   MIGRAINE HEADACHE 01/23/2009   GERD 01/23/2009   GASTRITIS 01/23/2009   LOOSE  STOOLS 01/23/2009   ALLERGY 01/23/2009   UTERINE CANCER, HX OF 01/23/2009    ONSET DATE: approx 2.5 months ago  REFERRING DIAG: PT eval/tx for gait training per Phillips Odor, MD   THERAPY DIAG:  Difficulty in walking, not elsewhere classified  Repeated falls  Other abnormalities of gait and mobility  Rationale for Evaluation and Treatment Rehabilitation  SUBJECTIVE: Feeling better today. She was dizzy after she got up last time, but has not had any issues like this since last visit. She does still feel wobbly and has some issues with balance, but no notable dizziness as of late.                                                                                                                                 PAIN:  Are you having pain? No  PRECAUTIONS: None  WEIGHT BEARING RESTRICTIONS No  FALLS: Has patient fallen in last 6 months? Yes. 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   MRI IMPRESSION: 1. No acute intracranial abnormality. 2. Mild chronic microvascular ischemic changes of the white matter.    COGNITION: Overall cognitive status: Within functional limits for tasks assessed   SENSATION: WFL  COORDINATION: FNF and Heel knee shin WNL   DTRs:  Biceps 2+ = Normal, Brachioradialis 2+ = Normal, Triceps 1 = Trace , and Patella 2+ = Normal  Hoffman's absent, no tone or clonus noted Head impulse test + Left   LOWER EXTREMITY ROM:     wfl  LOWER EXTREMITY MMT:    MMT Right Eval Left Eval  Hip flexion 4+ 4+   Hip extension    Hip abduction 5 5  Hip adduction    Hip internal rotation    Hip external rotation    Knee flexion    Knee extension 5 5  Ankle dorsiflexion 5 5  Ankle plantarflexion    Ankle inversion    Ankle eversion    (  Blank rows = not tested)    TRANSFERS: IND   GAIT: Gait pattern: step through pattern, decreased stance time- Left, decreased stride length, scissoring, trendelenburg, and narrow BOS Distance walked: 294 Assistive device utilized: None Level of assistance: Modified independence Comments: Intermittent stagger towards right side, intermittent scissoring, able to self correct  FUNCTIONAL TESTs:  5 times sit to stand: 13.6 seconds 2 minute walk test: 294 feet no AD Berg Balance Scale: 41/56    BERG  1. SITTING TO STANDING  4 able to stand without using hands and stabilize independently  2. STANDING UNSUPPORTED  4 able to stand safely for 2 minutes  If a subject is able to stand 2 minutes unsupported, score full points for sitting unsupported. Proceed to item #4  3. SITTING WITH BACK UNSUPPORTED BUT FEET SUPPORTED ON FLOOR OR ON A STOOL 4 able to sit safely and securely for 2 minutes  4. STANDING TO SITTING 4 sits safely with minimal use of hands  5. TRANSFERS 4 able to transfer safely with minor use of hands  6. STANDING UNSUPPORTED WITH EYES CLOSED  3 able to stand 10 seconds with supervision  7. STANDING UNSUPPORTED WITH FEET TOGETHER  3 able to place feet together independently and stand 1 minute with supervision  8. REACHING FORWARD WITH OUTSTRETCHED ARM WHILE STANDING  3 can reach forward 12 cm (5 inches)  9. PICK UP OBJECT FROM THE FLOOR FROM A STANDING POSITION  3 able to pick up slipper but needs supervision  10. TURNING TO LOOK BEHIND OVER LEFT AND RIGHT SHOULDERS WHILE STANDING 1 needs supervision when turning  11. TURN 360 DEGREES 1 needs close supervision or verbal cuing  12. PLACE ALTERNATE FOOT ON STEP OR STOOL  WHILE STANDING UNSUPPORTED 2 able to complete 4 steps without aid with supervision  13. STANDING UNSUPPORTED ONE FOOT IN FRONT  3 able to place foot ahead independently and hold 30 seconds   14. STANDING ON ONE LEG 2 able to lift leg independently and hold = 3 seconds  TOTAL SCORE  41/56 History of falls and BBS < 51, or no history of falls and BBS < 42 is predictive of falls ([place], 1997    Orthostatics: (03/18/22) Supine 154/69 HR69 Seated 142/69 HR71 Standing 150/70 HR 73 Standing at 3 minutes 145/72 HR 70  Dix-hallpike test: (03/18/22) Negative Rt, Rt beating nystagmus in Lt dix-hallpike position <30 second, very small amplitude, no rotational component, +dizziness, very mild  Horizontal roll test: (03/18/22) apogeotropic nystagums in left sidelying <15 seconds, very small amplitude     PATIENT SURVEYS:   DHI Total Score: 34 / 100 Physical Score: 8 / 28 Emotional Score: 8 / 36 Functional Score: 18 / 36  TODAY'S TREATMENT:            04/01/22  Eppley bilateral (ne gative)  Single leg balance 3 x 10" with int HHA Single leg vectors 3 x 5" each with int HHA Standing hip abduction GTB 2 x 10 each Standing hip extension GTB 2 x10 each  Sit to stand x10   10/5 /23:           Standing:  wall arch x 10                            Back to wall with UE flexion B x 10  Marching x 10                             Tandem stance x 5                             Side stepping with green thera-band x 2 RT                            Vector stance x B x 3             Pt has complaint of dizziness with Lt dix-hallpike but no noted nystagmus. Eply maneuver completed x 2  03/25/22 Heel toe rocks 3x10 Alternating march 3x 10 bilateral  6" box retro toe tap 2x10 bilateral 6: box lateral toe tap 2x10 bilateral Step up 6 inch 2 x 10 bilateral (no UE assist) Tandem stance 2x 30 second holds bilateral  SLS 3x 15 seconds bilateral (toe touch to  correct) Cone taps - 3 small orange domes 2x5 RT ea Lateral stepping 3x 15 feet bilateral  Navigating cones with sharp turns along blue line x 4 RT Gait with head turns - 2 x 40 feet vertical, 2 x 40 feet horizontal Standing on foam WBOS with PNF reaching x10 ea Standing on foam NBOS 3x30" Sit to stand x10 no UE support  PATIENT EDUCATION: Education details: 03/20/22: HEP; EVAL: Findings, safety, POC Person educated: Patient Education method: Explanation Education comprehension: verbalized understanding   HOME EXERCISE PROGRAM: Access Code: [email or web address] URL: [email or web address]  04/01/22  - Hip Abduction with Resistance Loop  - 2 x daily - 7 x weekly - 2 sets - 10 reps - Hip Extension with Resistance Loop  - 2 x daily - 7 x weekly - 2 sets - 10 reps - Sit to Stand Without Arm Support  - 2 x daily - 7 x weekly - 1-2 sets - 10 reps - Standing 3-Way Kick  - 2 x daily - 7 x weekly - 1 sets - 10 reps - 3-5 second hold  9/28 - Side Stepping with Counter Support  - 3 x daily - 7 x weekly - 4 sets - 10 reps - Single Leg Stance with Support  - 3 x daily - 7 x weekly - 3 reps - 30 second hold  Date: 03/18/2022 Prepared by: Candie Mile  Exercises - Standing Tandem Balance with Counter Support  - 3 x daily - 7 x weekly - 3 sets - 15 seconds hold - Standing March with Counter Support  - 3 x daily - 7 x weekly - 3 sets - 10 reps - Heel Toe Raises with Counter Support  - 3 x daily - 7 x weekly - 3 sets - 10 reps - Gaze Stabilty (VOR) x1 Feet Together on Firm Ground With Horizontal Head Turns  - 3 x daily - 7 x weekly - 3 sets - 30 seconds hold    GOALS: Goals reviewed with patient? Yes  SHORT TERM GOALS: Target date: 04/03/22  Patient will be independent with initial HEP and self-management strategies to improve functional outcomes Baseline:  Goal status: IN PROGRESS    LONG TERM GOALS: Target date: 04/24/22  Patient will be independent with advanced HEP and  self-management strategies to improve functional outcomes Baseline:  Goal status: IN PROGRESS  2.  Patient will reduce Dizziness Handicap Inventory  score by at least 4 points to indicate improvement in functional outcomes. (([place]). A four-point change in the DHI-S would be statistically significant at the p = 0.05 level) Baseline: 34 / 100 Goal status: IN PROGRESS  3.  Patient will reduce fall risk based on BERG balance score 51 or greater (in patients with hx of falls. Baseline: 41/56 Goal status: IN PROGRESS  4. 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 Baseline: Reports 80% back to baseline Goal status: IN PROGRESS  5. Patient will be able to ambulate at least 325 feet during 2MWT with LRAD to demonstrate improved ability to perform functional mobility and associated tasks. Baseline: 294 feet no AD Goal status: IN PROGRESS   ASSESSMENT:  CLINICAL IMPRESSION: Completed Eppley bilateral per previous plan in notes. Was negative bilateral. Resumed LE strength and balance activity. Patient doing well with static balance overall, but is challenged with single limb stance. Added green there band to hip abduction and extension for LE strength progression. Patient cued on proper form for target muscle activation. Issued updated HEP handout. Patient will continue to benefit from skilled therapy services to reduce remaining deficits and improve functional ability.    OBJECTIVE IMPAIRMENTS Abnormal gait, decreased activity tolerance, decreased balance, decreased endurance, decreased knowledge of condition, decreased mobility, difficulty walking, and dizziness.   ACTIVITY LIMITATIONS carrying, standing, stairs, and locomotion level  PARTICIPATION LIMITATIONS: meal prep, cleaning, laundry, shopping, community activity, occupation, and yard  work  PERSONAL FACTORS Age and Time since onset of injury/illness/exacerbation are also affecting patient's functional outcome.   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   2:40 PM, 04/01/22 Josue [place] PT DPT  Physical Therapist with [facility]  [phone]

## 2022-04-03 ENCOUNTER — Ambulatory Visit (HOSPITAL_COMMUNITY): Payer: Medicare HMO | Admitting: Physical Therapy

## 2022-04-03 DIAGNOSIS — R296 Repeated falls: Secondary | ICD-10-CM

## 2022-04-03 DIAGNOSIS — R262 Difficulty in walking, not elsewhere classified: Secondary | ICD-10-CM | POA: Diagnosis not present

## 2022-04-03 DIAGNOSIS — R2689 Other abnormalities of gait and mobility: Secondary | ICD-10-CM

## 2022-04-03 NOTE — Therapy (Signed)
OUTPATIENT PHYSICAL THERAPY NEURO TREATMENT   Patient Name: Kayla Thomas MRN: [id number] DOB:02-Mar-1941, 81 y.o., female Today's Date: 04/03/2022   PCP: Asencion Noble, MD REFERRING PROVIDER: Phillips Odor, MD    PT End of Session - 04/03/22 1040     Visit Number 7    Number of Visits 12    Date for PT Re-Evaluation 04/24/22    Authorization Type [employer] HMO (no auth, no VL)    Progress Note Due on Visit 10    PT Start Time [phone]    PT Stop Time 1032    PT Time Calculation (min) 38 min    Activity Tolerance Patient tolerated treatment well    Behavior During Therapy [facility] for tasks assessed/performed               Past Medical History:  Diagnosis Date   Breast cancer ([place])    Breast cancer ([place])    Chronic back pain    Gastric ulcer    History   GERD (gastroesophageal reflux disease) 01/21/2006   EGD Dr Oneida Alar mild chronic gastritis, (NO h pylori) otherwise normal   Headache(784.0)    Sinus congestion    Thumb tendonitis September 2014   left   Uterine cancer ( [place]) 1998   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   Status post vitrectomy March 2021 revealed resolved VM traction   Wears partial dentures    Past Surgical History:  Procedure Laterality Date   ABDOMINAL HYSTERECTOMY     ABSCESS DRAINAGE  03-04-05   insect bite   CARDIAC CATHETERIZATION N/A 10/31/2014   Procedure: Right/Left Heart Cath and Coronary Angiography;  Surgeon: Troy Sine, MD;  Location: [facility];  Service: Cardiovascular;  Laterality: N/A;   CATARACT EXTRACTION W/PHACO  10/06/2011   Procedure: CATARACT EXTRACTION PHACO AND INTRAOCULAR LENS PLACEMENT (IOC);  Surgeon: Williams Che, MD;  Location: [facility];  Service: Ophthalmology;  Laterality: Left;  CDE:  5.76   cataracts     CHOLECYSTECTOMY  11/1999   COLONOSCOPY  05/13/2011   Procedure: COLONOSCOPY;  Surgeon: Dorothyann Peng, MD;  Location: [facility];  Service: Endoscopy;  Laterality: N/A;  8:30    COLONOSCOPY N/A 04/09/2017   Dr. Oneida Alar: single tubular adenoma removed from the colon, redundant left colon, internal and external hemorrhoids, next colonoscopy in 10 to 15 years.   ESOPHAGOGASTRODUODENOSCOPY  01/21/06   mild antral erythema bx h-pylori/normal esophagus without evidence of mass or Barrett's/normal pylorus and duodenum   PARTIAL MASTECTOMY WITH NEEDLE LOCALIZATION AND AXILLARY SENTINEL LYMPH NODE BX Left 08/18/2012   Procedure: PARTIAL MASTECTOMY WITH NEEDLE LOCALIZATION AND AXILLARY SENTINEL LYMPH NODE BX;  Surgeon: Jamesetta So, MD;  Location: [facility];  Service: General;  Laterality: Left;  Need Frozen Section/Sentinel Node Bx @ 8:00am/Needle Loc @ 9:00am   POLYPECTOMY  04/09/2017   Procedure: POLYPECTOMY;  Surgeon: Danie Binder, MD;  Location: [facility];  Service: Endoscopy;;  Rectal   S/P Hysterectomy  1998   uterine ca   temperal/mandibular  [place] Right 05/04/2014   Procedure: RELEASE TRIGGER FINGER/A-1 PULLEY RIGHT RING FINGER;  Surgeon: Daryll Brod, MD;  Location: [facility];  Service: Orthopedics;  Laterality: Right;   Patient Active Problem List   Diagnosis Date Noted   History of vitrectomy 12/05/2021   Intermediate stage nonexudative age-related macular degeneration of left eye 05/21/2021   Adult onset vitelliform macular dystrophy  05/03/2020   Dry eyes, bilateral 05/03/2020   Follow-up examination after eye surgery 10/19/2019   Advanced nonexudative age-related macular degeneration of right eye with subfoveal involvement 10/19/2019   Polyp of rectum    Vulval lesion benign, excised 12/22/2016   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   Closed nondisplaced fracture of distal phalanx of right middle finger 02/27/2016   Breast cancer ([place]) 04/30/2015   Abnormal ECG    DOE (dyspnea on exertion) 04/12/2014   MIGRAINE HEADACHE 01/23/2009   GERD 01/23/2009   GASTRITIS 01/23/2009   LOOSE  STOOLS 01/23/2009   ALLERGY 01/23/2009   UTERINE CANCER, HX OF 01/23/2009    ONSET DATE: approx 2.5 months ago  REFERRING DIAG: PT eval/tx for gait training per Phillips Odor, MD   THERAPY DIAG:  Difficulty in walking, not elsewhere classified  Repeated falls  Other abnormalities of gait and mobility  Rationale for Evaluation and Treatment Rehabilitation  SUBJECTIVE: 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  Pt states she is doing well without dizziness today. Last time was 2 mornings ago when she sat up in bed and fell backwards.                                                                                                                              PAIN:  Are you having pain? No  PRECAUTIONS: None  WEIGHT BEARING RESTRICTIONS No  FALLS: Has patient fallen in last 6 months? Yes. 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   MRI IMPRESSION: 1. No acute intracranial abnormality. 2. Mild chronic microvascular ischemic changes of the white matter.    COGNITION: Overall cognitive status: Within functional limits for tasks assessed   SENSATION: WFL  COORDINATION: FNF and Heel knee shin WNL   DTRs:  Biceps 2+ = Normal, Brachioradialis 2+ = Normal, Triceps 1 = Trace , and Patella 2+ = Normal  Hoffman's absent, no tone or clonus noted Head impulse test + Left   LOWER EXTREMITY ROM:     wfl  LOWER EXTREMITY MMT:    MMT Right Eval Left Eval  Hip flexion 4+ 4+  Hip extension    Hip  abduction 5 5  Hip adduction    Hip internal rotation    Hip external rotation    Knee flexion    Knee extension 5 5  Ankle dorsiflexion 5 5  Ankle plantarflexion    Ankle inversion    Ankle eversion    (Blank rows =  not tested)    TRANSFERS: IND   GAIT: Gait pattern: step through pattern, decreased stance time- Left, decreased stride length, scissoring, trendelenburg, and narrow BOS Distance walked: 294 Assistive device utilized: None Level of assistance: Modified independence Comments: Intermittent stagger towards right side, intermittent scissoring, able to self correct  FUNCTIONAL TESTs:  5 times sit to stand: 13.6 seconds 2 minute walk test: 294 feet no AD Berg Balance Scale: 41/56    BERG  1. SITTING TO STANDING  4 able to stand without using hands and stabilize independently  2. STANDING UNSUPPORTED  4 able to stand safely for 2 minutes  If a subject is able to stand 2 minutes unsupported, score full points for sitting unsupported. Proceed to item #4  3. SITTING WITH BACK UNSUPPORTED BUT FEET SUPPORTED ON FLOOR OR ON A STOOL 4 able to sit safely and securely for 2 minutes  4. STANDING TO SITTING 4 sits safely with minimal use of hands  5. TRANSFERS 4 able to transfer safely with minor use of hands  6. STANDING UNSUPPORTED WITH EYES CLOSED  3 able to stand 10 seconds with supervision  7. STANDING UNSUPPORTED WITH FEET TOGETHER  3 able to place feet together independently and stand 1 minute with supervision  8. REACHING FORWARD WITH OUTSTRETCHED ARM WHILE STANDING  3 can reach forward 12 cm (5 inches)  9. PICK UP OBJECT FROM THE FLOOR FROM A STANDING POSITION  3 able to pick up slipper but needs supervision  10. TURNING TO LOOK BEHIND OVER LEFT AND RIGHT SHOULDERS WHILE STANDING 1 needs supervision when turning  11. TURN 360 DEGREES 1 needs close supervision or verbal cuing  12. PLACE ALTERNATE FOOT ON STEP OR STOOL WHILE STANDING  UNSUPPORTED 2 able to complete 4 steps without aid with supervision  13. STANDING UNSUPPORTED ONE FOOT IN FRONT  3 able to place foot ahead independently and hold 30 seconds   14. STANDING ON ONE LEG 2 able to lift leg independently and hold = 3 seconds  TOTAL SCORE  41/56 History of falls and BBS < 51, or no history of falls and BBS < 42 is predictive of falls ([place], 1997    Orthostatics: (03/18/22) Supine 154/69 HR69 Seated 142/69 HR71 Standing 150/70 HR 73 Standing at 3 minutes 145/72 HR 70  Dix-hallpike test: (03/18/22) Negative Rt, Rt beating nystagmus in Lt dix-hallpike position <30 second, very small amplitude, no rotational component, +dizziness, very mild  Horizontal roll test: (03/18/22) apogeotropic nystagums in left sidelying <15 seconds, very small amplitude     PATIENT SURVEYS:   DHI Total Score: 34 / 100 Physical Score: 8 / 28 Emotional Score: 8 / 36 Functional Score: 18 / 36  TODAY'S TREATMENT:  04/03/22 All below with minimal, intermittent HHA (mostly tapping) Heel raises on incline 20X Toe raises on incline 20X Alternating high march, no UE 10X 2 sets Standing hip abduction GTB 2 x 10 each (at thighs) Standing hip extension GTB 2 x10 each (at thighs) 4" forward step ups 2X10 each 4" lateral step ups 2X10 each Squats 2X10 in good form Forward lunges onto 4" step 2X10 each no UE assist. Tandem stance each LE lead with 2# cane lift rotate to Right; center ; rotate to Left; return 10X each SLS max challenge on each LE X5 trials Rt: 12", Lt: 8"   04/01/22 [place] bilateral (negative)  Single leg balance 3 x 10" with int HHA Single leg vectors 3 x 5" each with int HHA Standing  hip abduction GTB 2 x 10 each Standing hip extension GTB 2 x10 each  Sit to stand x10   10/5 /23:           Standing:  wall arch x 10                            Back to wall with UE flexion B x 10                             Marching x 10                              Tandem stance x 5                             Side stepping with green thera-band x 2 RT                            Vector stance x B x 3            Pt has complaint of dizziness with Lt dix-hallpike but no noted nystagmus. Eply maneuver completed x 2  03/25/22 Heel toe rocks 3x10 Alternating march 3x 10 bilateral  6" box retro toe tap 2x10 bilateral 6: box lateral toe tap 2x10 bilateral Step up 6 inch 2 x 10 bilateral (no UE assist) Tandem stance 2x 30 second holds bilateral  SLS 3x 15 seconds bilateral (toe touch to correct) Cone taps - 3 small orange domes 2x5 RT ea Lateral stepping 3x 15 feet bilateral  Navigating cones with sharp turns along blue line x 4 RT Gait with head turns - 2 x 40 feet vertical, 2 x 40 feet horizontal Standing on foam WBOS with PNF reaching x10 ea Standing on foam NBOS 3x30" Sit to stand x10 no UE support  PATIENT EDUCATION: Education details: 03/20/22: HEP; EVAL: Findings, safety, POC Person educated: Patient Education method: Explanation Education comprehension: verbalized understanding   HOME EXERCISE PROGRAM: Access Code: [email or web address] URL: [email or web address]  04/01/22  - Hip Abduction with Resistance Loop  - 2 x daily - 7 x weekly - 2 sets - 10 reps - Hip Extension with Resistance Loop  - 2 x daily - 7 x weekly - 2 sets - 10 reps - Sit to Stand Without Arm Support  - 2 x daily - 7 x weekly - 1-2 sets - 10 reps - Standing 3-Way Kick  - 2 x daily - 7 x weekly - 1 sets - 10 reps - 3-5 second hold  9/28 - Side Stepping with Counter Support  - 3 x daily - 7 x weekly - 4 sets - 10 reps - Single Leg Stance with Support  - 3 x daily - 7 x weekly - 3 reps - 30 second hold  Date: 03/18/2022 Prepared by: Candie Mile  Exercises - Standing Tandem Balance with Counter Support  - 3 x daily - 7 x weekly - 3 sets - 15 seconds hold - Standing March with Counter Support  - 3 x daily - 7 x weekly - 3 sets - 10 reps - Heel Toe Raises with  Counter Support  - 3 x daily - 7 x weekly - 3 sets - 10 reps - Gaze Stabilty (VOR)  x1 Feet Together on Firm Ground With Horizontal Head Turns  - 3 x daily - 7 x weekly - 3 sets - 30 seconds hold    GOALS: Goals reviewed with patient? Yes  SHORT TERM GOALS: Target date: 04/03/22  Patient will be independent with initial HEP and self-management strategies to improve functional outcomes Baseline:  Goal status: IN PROGRESS    LONG TERM GOALS: Target date: 04/24/22  Patient will be independent with advanced HEP and self-management strategies to improve functional outcomes Baseline:  Goal status: IN PROGRESS  2.  Patient will reduce Dizziness Handicap Inventory score by at least 4 points to indicate improvement in functional outcomes. (([place]). A four-point change in the DHI-S would be statistically significant at the p = 0.05 level) Baseline: 34 / 100 Goal status: IN PROGRESS  3.  Patient will reduce fall risk based on BERG balance score 51 or greater (in patients with hx of falls. Baseline: 41/56 Goal status: IN PROGRESS  4. 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 Baseline: Reports 80% back to baseline Goal status: IN PROGRESS  5. Patient will be able to ambulate at least 325 feet during 2MWT with LRAD to demonstrate improved ability to perform functional mobility and associated tasks. Baseline: 294 feet no AD Goal status: IN PROGRESS   ASSESSMENT:  CLINICAL IMPRESSION: Continued with focus on improving LE strength and stability.  Completed all exercises in // bars with minimal to no UE assist (as needed).  Single limb stance remains challenging with max ability to maintain for 12" on Rt, 8" on Lt before having to use UE's to stabilize.  Added tandem balance challenge using 2# bar with visual challenge and instability by patient.   Stated this was hardest challenge yet requiring min assist from therapist to help maintain stability.  No dizziness reported during session today.  Patient will continue to benefit from skilled therapy services to reduce remaining deficits and improve functional ability.    OBJECTIVE IMPAIRMENTS Abnormal gait, decreased activity tolerance, decreased balance, decreased endurance, decreased knowledge of condition, decreased mobility, difficulty walking, and dizziness.   ACTIVITY LIMITATIONS carrying, standing, stairs, and locomotion level  PARTICIPATION LIMITATIONS: meal prep, cleaning, laundry, shopping, community activity, occupation, and yard work  PERSONAL FACTORS Age and Time since onset of injury/illness/exacerbation are also affecting patient's functional outcome.   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   10:41 AM, 04/03/22 Teena Irani, PTA/CLT [place] Ph: [phone]

## 2022-04-08 ENCOUNTER — Encounter (HOSPITAL_COMMUNITY): Payer: Medicare HMO | Admitting: Physical Therapy

## 2022-04-10 ENCOUNTER — Ambulatory Visit (HOSPITAL_COMMUNITY): Payer: Medicare HMO | Admitting: Physical Therapy

## 2022-04-10 ENCOUNTER — Encounter (HOSPITAL_COMMUNITY): Payer: Self-pay | Admitting: Physical Therapy

## 2022-04-10 DIAGNOSIS — R2689 Other abnormalities of gait and mobility: Secondary | ICD-10-CM

## 2022-04-10 DIAGNOSIS — R262 Difficulty in walking, not elsewhere classified: Secondary | ICD-10-CM | POA: Diagnosis not present

## 2022-04-10 DIAGNOSIS — R296 Repeated falls: Secondary | ICD-10-CM

## 2022-04-10 NOTE — Therapy (Signed)
OUTPATIENT PHYSICAL THERAPY NEURO TREATMENT   Patient Name: Kayla Thomas MRN: [id number] DOB:[birth year], 81 y.o., female Today's Date: 04/10/2022   PCP: Asencion Noble, MD REFERRING PROVIDER: Phillips Odor, MD    PT End of Session - 04/10/22 1028     Visit Number 8    Number of Visits 12    Date for PT Re-Evaluation 04/24/22    Authorization Type [employer] HMO (no auth, no VL)    Progress Note Due on Visit 10    PT Start Time 1028    PT Stop Time 1109    PT Time Calculation (min) 41 min    Activity Tolerance Patient tolerated treatment well    Behavior During Therapy [facility] for tasks assessed/performed               Past Medical History:  Diagnosis Date   Breast cancer ([place])    Breast cancer ([place])    Chronic back pain    Gastric ulcer    History   GERD (gastroesophageal reflux disease) 01/21/2006   EGD Dr Oneida Alar mild chronic gastritis, (NO h pylori) otherwise normal   Headache(784.0)    Sinus congestion    Thumb tendonitis September 2014   left   Uterine cancer ([place]) 1998   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   Status post vitrectomy March 2021 revealed resolved VM traction   Wears partial dentures    Past Surgical History:  Procedure Laterality Date   ABDOMINAL HYSTERECTOMY     ABSCESS DRAINAGE  03-04-05   insect bite   CARDIAC CATHETERIZATION N/A 10/31/2014   Procedure: Right/Left Heart Cath and Coronary Angiography;  Surgeon: Troy Sine, MD;  Location: [place] [facility];  Service: Cardiovascular;  Laterality: N/A;   CATARACT EXTRACTION W/PHACO  10/06/2011   Procedure: CATARACT EXTRACTION PHACO AND INTRAOCULAR LENS PLACEMENT (IOC);  Surgeon: Williams Che, MD;  Location: [facility];  Service: Ophthalmology;  Laterality: Left;  CDE:  5.76   cataracts     CHOLECYSTECTOMY  11/1999   COLONOSCOPY  05/13/2011   Procedure: COLONOSCOPY;  Surgeon: Dorothyann Peng, MD;  Location: [facility];  Service: Endoscopy;  Laterality: N/A;  8:30    COLONOSCOPY N/A 04/09/2017   Dr. Oneida Alar: single tubular adenoma removed from the colon, redundant left colon, internal and external hemorrhoids, next colonoscopy in 10 to 15 years.   ESOPHAGOGASTRODUODENOSCOPY  01/21/06   mild antral erythema bx h-pylori/normal esophagus without evidence of mass or Barrett's/normal pylorus and duodenum   PARTIAL MASTECTOMY WITH NEEDLE LOCALIZATION AND AXILLARY SENTINEL LYMPH NODE BX Left 08/18/2012   Procedure: PARTIAL MASTECTOMY WITH NEEDLE LOCALIZATION AND AXILLARY SENTINEL LYMPH NODE BX;  Surgeon: Jamesetta So, MD;  Location: [facility];  Service: General;  Laterality: Left;  Need Frozen Section/Sentinel Node Bx @ 8:00am/Needle Loc @ 9:00am   POLYPECTOMY  04/09/2017   Procedure: POLYPECTOMY;  Surgeon: Danie Binder, MD;  Location: [facility];  Service: Endoscopy;;  Rectal   S/P Hysterectomy  1998   uterine ca   temperal/mandibular  [place] Right 05/04/2014   Procedure: RELEASE TRIGGER FINGER/A-1 PULLEY RIGHT RING FINGER;  Surgeon: Daryll Brod, MD;  Location: 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   Closed nondisplaced fracture of distal phalanx of right middle finger 02/27/2016   Breast cancer ([place]) 04/30/2015   Abnormal ECG    DOE (dyspnea on exertion) 04/12/2014   MIGRAINE HEADACHE 01/23/2009   GERD 01/23/2009   GASTRITIS 01/23/2009   LOOSE  STOOLS 01/23/2009   ALLERGY 01/23/2009   UTERINE CANCER, HX OF 01/23/2009    ONSET DATE: approx 2.5 months ago  REFERRING DIAG: PT eval/tx for gait training per Phillips Odor, MD   THERAPY DIAG:  Difficulty in walking, not elsewhere classified  Repeated falls  Other abnormalities of gait and mobility  Rationale for Evaluation and Treatment Rehabilitation  SUBJECTIVE: pt states no dizziness, a little off balance. Some unsteadiness with walking, turning too quick and bending over. Standing on 1 foot is still difficult.                                                                                                  PAIN:  Are you having pain? No  PRECAUTIONS: None  WEIGHT BEARING RESTRICTIONS No  FALLS: Has patient fallen in last 6 months? Yes. 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   MRI IMPRESSION: 1. No acute intracranial abnormality. 2. Mild chronic microvascular ischemic changes of the white matter.    COGNITION: Overall cognitive status: Within functional limits for tasks assessed   SENSATION: WFL  COORDINATION: FNF and Heel knee shin WNL   DTRs:  Biceps 2+ = Normal, Brachioradialis 2+ = Normal, Triceps 1 = Trace , and Patella 2+ = Normal  Hoffman's absent, no tone or clonus noted Head impulse test + Left   LOWER EXTREMITY ROM:     wfl  LOWER EXTREMITY MMT:    MMT Right Eval Left Eval  Hip flexion 4+ 4+  Hip extension    Hip abduction 5 5  Hip adduction    Hip internal rotation    Hip external  rotation    Knee flexion    Knee extension 5 5  Ankle dorsiflexion 5 5  Ankle plantarflexion    Ankle inversion    Ankle eversion    (Blank rows = not tested)    TRANSFERS: IND   GAIT: Gait pattern: step through pattern, decreased stance time- Left, decreased stride length, scissoring, trendelenburg, and narrow BOS Distance walked: 294 Assistive device utilized: None Level of assistance: Modified independence Comments:  Intermittent stagger towards right side, intermittent scissoring, able to self correct  FUNCTIONAL TESTs:  5 times sit to stand: 13.6 seconds 2 minute walk test: 294 feet no AD Berg Balance Scale: 41/56    BERG  1. SITTING TO STANDING  4 able to stand without using hands and stabilize independently  2. STANDING UNSUPPORTED  4 able to stand safely for 2 minutes  If a subject is able to stand 2 minutes unsupported, score full points for sitting unsupported. Proceed to item #4  3. SITTING WITH BACK UNSUPPORTED BUT FEET SUPPORTED ON FLOOR OR ON A STOOL 4 able to sit safely and securely for 2 minutes  4. STANDING TO SITTING 4 sits safely with minimal use of hands  5. TRANSFERS 4 able to transfer safely with minor use of hands  6. STANDING UNSUPPORTED WITH EYES CLOSED  3 able to stand 10 seconds with supervision  7. STANDING UNSUPPORTED WITH FEET TOGETHER  3 able to place feet together independently and stand 1 minute with supervision  8. REACHING FORWARD WITH OUTSTRETCHED ARM WHILE STANDING  3 can reach forward 12 cm (5 inches)  9. PICK UP OBJECT FROM THE FLOOR FROM A STANDING POSITION  3 able to pick up slipper but needs supervision  10. TURNING TO LOOK BEHIND OVER LEFT AND RIGHT SHOULDERS WHILE STANDING 1 needs supervision when turning  11. TURN 360 DEGREES 1 needs close supervision or verbal cuing  12. PLACE ALTERNATE FOOT ON STEP OR STOOL WHILE STANDING UNSUPPORTED 2 able to complete 4 steps without aid with supervision  13. STANDING  UNSUPPORTED ONE FOOT IN FRONT  3 able to place foot ahead independently and hold 30 seconds   14. STANDING ON ONE LEG 2 able to lift leg independently and hold = 3 seconds  TOTAL SCORE  41/56 History of falls and BBS < 51, or no history of falls and BBS < 42 is predictive of falls ([place], 1997    Orthostatics: (03/18/22) Supine 154/69 HR69 Seated 142/69 HR71 Standing 150/70 HR 73 Standing at 3 minutes 145/72 HR 70  Dix-hallpike test: (03/18/22) Negative Rt, Rt beating nystagmus in Lt dix-hallpike position <30 second, very small amplitude, no rotational component, +dizziness, very mild  Horizontal roll test: (03/18/22) apogeotropic nystagums in left sidelying <15 seconds, very small amplitude     PATIENT SURVEYS:   DHI Total Score: 34 / 100 Physical Score: 8 / 28 Emotional Score: 8 / 36 Functional Score: 18 / 36  TODAY'S TREATMENT:  04/10/22 Heel raises on incline 20X Toe raises on incline 20X Alternating high march, no UE 10X 2 sets Standing hip abduction GTB 2 x 10 each band at calf Standing hip extension GTB 2 x10 each band at calf Lateral stepping with mini squat 6 x 15 feet bilateral  Retro gait holding black TB x 10 Palof press with NBOS 2x 10 black TB Stairs 2 x 10 steps 7 inch  04/03/22 All below with minimal, intermittent HHA (mostly tapping) Heel raises on incline 20X Toe raises on incline 20X Alternating high march, no UE 10X 2 sets Standing hip abduction GTB 2 x 10 each (at thighs) Standing hip extension GTB 2 x10 each (at thighs) 4" forward step ups 2X10 each 4" lateral step ups 2X10 each Squats 2X10 in good form Forward lunges onto 4" step 2X10 each no UE assist. Tandem stance each LE lead with 2# cane lift rotate to Right; center ; rotate to Left; return 10X each SLS max challenge on each  LE X5 trials Rt: 12", Lt: 8"   04/01/22 [place] bilateral (negative)  Single leg balance 3 x 10" with int HHA Single leg vectors 3 x 5" each with int  HHA Standing hip abduction GTB 2 x 10 each Standing hip extension GTB 2 x10 each  Sit to stand x10     PATIENT EDUCATION: Education details: 03/20/22: HEP; EVAL: Findings, safety, POC Person educated: Patient Education method: Explanation Education comprehension: verbalized understanding   HOME EXERCISE PROGRAM: Access Code: [email or web address] URL: [email or web address]  04/01/22  - Hip Abduction with Resistance Loop  - 2 x daily - 7 x weekly - 2 sets - 10 reps - Hip Extension with Resistance Loop  - 2 x daily - 7 x weekly - 2 sets - 10 reps - Sit to Stand Without Arm Support  - 2 x daily - 7 x weekly - 1-2 sets - 10 reps - Standing 3-Way Kick  - 2 x daily - 7 x weekly - 1 sets - 10 reps - 3-5 second hold  9/28 - Side Stepping with Counter Support  - 3 x daily - 7 x weekly - 4 sets - 10 reps - Single Leg Stance with Support  - 3 x daily - 7 x weekly - 3 reps - 30 second hold  Date: 03/18/2022 Prepared by: Candie Mile  Exercises - Standing Tandem Balance with Counter Support  - 3 x daily - 7 x weekly - 3 sets - 15 seconds hold - Standing March with Counter Support  - 3 x daily - 7 x weekly - 3 sets - 10 reps - Heel Toe Raises with Counter Support  - 3 x daily - 7 x weekly - 3 sets - 10 reps - Gaze Stabilty (VOR) x1 Feet Together on Firm Ground With Horizontal Head Turns  - 3 x daily - 7 x weekly - 3 sets - 30 seconds hold    GOALS: Goals reviewed with patient? Yes  SHORT TERM GOALS: Target date: 04/03/22  Patient will be independent with initial HEP and self-management strategies to improve functional outcomes Baseline:  Goal status: IN PROGRESS    LONG TERM GOALS: Target date: 04/24/22  Patient will be independent with advanced HEP and self-management strategies to improve functional outcomes Baseline:  Goal status: IN PROGRESS  2.  Patient will reduce Dizziness Handicap Inventory score by at least 4 points to indicate improvement in functional outcomes.  (([place]). A four-point change in the DHI-S would be statistically significant at the p = 0.05 level) Baseline: 34 / 100 Goal status: IN PROGRESS  3.  Patient will reduce fall risk based on BERG balance score 51 or greater (in patients with hx of falls. Baseline: 41/56 Goal status: IN PROGRESS  4. 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 Baseline: Reports 80% back to baseline Goal status: IN PROGRESS  5. Patient will be able to ambulate at least 325 feet during 2MWT with LRAD to demonstrate improved ability to perform functional mobility and associated tasks. Baseline: 294 feet no AD Goal status: IN PROGRESS   ASSESSMENT:  CLINICAL IMPRESSION: Continued with standing strengthening exercises which are tolerated well. Intermittent HHA required for balance with standing exercises. Attempted progressing hip strengthening exercises to band at ankles but patient limited by glute weakness and had to move band to proximal gastroc region. Intermittent unsteadiness with static  and dynamic balance but without loss of balance. Moderate fatigue following stairs with alternating pattern, mild unilateral HHA. Patient will continue to benefit from physical therapy in order to improve function and reduce impairment.    OBJECTIVE IMPAIRMENTS Abnormal gait, decreased activity tolerance, decreased balance, decreased endurance, decreased knowledge of condition, decreased mobility, difficulty walking, and dizziness.   ACTIVITY LIMITATIONS carrying, standing, stairs, and locomotion level  PARTICIPATION LIMITATIONS: meal prep, cleaning, laundry, shopping, community activity, occupation, and yard work  PERSONAL FACTORS Age and Time since onset of injury/illness/exacerbation are also affecting patient's functional outcome.   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 Possibly trial SPC  with ambulation  11:09 AM, 04/10/22 Mearl Latin PT, DPT Physical Therapist at [facility]

## 2022-04-15 ENCOUNTER — Ambulatory Visit (HOSPITAL_COMMUNITY): Payer: Medicare HMO | Admitting: Physical Therapy

## 2022-04-15 DIAGNOSIS — R296 Repeated falls: Secondary | ICD-10-CM

## 2022-04-15 DIAGNOSIS — R262 Difficulty in walking, not elsewhere classified: Secondary | ICD-10-CM

## 2022-04-15 DIAGNOSIS — R2689 Other abnormalities of gait and mobility: Secondary | ICD-10-CM

## 2022-04-15 NOTE — Therapy (Signed)
OUTPATIENT PHYSICAL THERAPY NEURO TREATMENT   Patient Name: Kayla Thomas MRN: [id number] DOB:Oct 10, 1940, 81 y.o., female Today's Date: 04/15/2022   PCP: Asencion Noble, MD REFERRING PROVIDER: Phillips Odor, MD    PT End of Session - 04/15/22 0949     Visit Number 9    Number of Visits 12    Date for PT Re-Evaluation 04/24/22    Authorization Type [employer] HMO (no auth, no VL)    Progress Note Due on Visit 10    PT Start Time [phone]    PT Stop Time 1028    PT Time Calculation (min) 40 min    Activity Tolerance Patient tolerated treatment well    Behavior During Therapy [facility] for tasks assessed/performed               Past Medical History:  Diagnosis Date   Breast cancer ([place])    Breast cancer ([place])    Chronic back pain    Gastric ulcer    History   GERD (gastroesophageal reflux disease) 01/21/2006   EGD Dr Oneida Alar mild chronic gastritis, (NO h pylori) otherwise normal   Headache(784.0)    Sinus congestion    Thumb tendonitis September 2014   left   Uterine cancer ([place]) 1998   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   Status post vitrectomy March 2021 revealed resolved VM traction   Wears partial dentures    Past Surgical History:  Procedure Laterality Date   ABDOMINAL HYSTERECTOMY     ABSCESS DRAINAGE  03-04-05   insect bite   CARDIAC CATHETERIZATION N/A 10/31/2014   Procedure: Right/Left Heart Cath and Coronary Angiography;  Surgeon: Troy Sine, MD;  Location: [place] [facility];  Service: Cardiovascular;  Laterality: N/A;   CATARACT EXTRACTION W/PHACO  10/06/2011   Procedure: CATARACT EXTRACTION PHACO AND INTRAOCULAR LENS PLACEMENT (IOC);  Surgeon: Williams Che, MD;  Location: [facility];  Service: Ophthalmology;  Laterality: Left;  CDE:  5.76   cataracts     CHOLECYSTECTOMY  11/1999   COLONOSCOPY  05/13/2011   Procedure: COLONOSCOPY;  Surgeon: Dorothyann Peng, MD;  Location: [facility];  Service: Endoscopy;  Laterality: N/A;  8:30    COLONOSCOPY N/A 04/09/2017   Dr. Oneida Alar: single tubular adenoma removed from the colon, redundant left colon, internal and external hemorrhoids, next colonoscopy in 10 to 15 years.   ESOPHAGOGASTRODUODENOSCOPY  01/21/06   mild antral erythema bx h-pylori/normal esophagus without evidence of mass or Barrett's/normal pylorus and duodenum   PARTIAL MASTECTOMY WITH NEEDLE LOCALIZATION AND AXILLARY SENTINEL LYMPH NODE BX Left 08/18/2012   Procedure: PARTIAL MASTECTOMY WITH NEEDLE LOCALIZATION AND AXILLARY SENTINEL LYMPH NODE BX;  Surgeon: Jamesetta So, MD;  Location: [facility];  Service: General;  Laterality: Left;  Need Frozen Section/Sentinel Node Bx @ 8:00am/Needle Loc @ 9:00am   POLYPECTOMY  04/09/2017   Procedure: POLYPECTOMY;  Surgeon: Danie Binder, MD;  Location[facility];  Service: Endoscopy;;  Rectal   S/P Hysterectomy  1998   uterine ca   temperal/mandibular  [place] Right 05/04/2014   Procedure: RELEASE TRIGGER FINGER/A-1 PULLEY RIGHT RING FINGER;  Surgeon: Daryll Brod, MD;  Location: [place];  Service: Orthopedics;  Laterality: Right;   Patient Active Problem List   Diagnosis Date Noted   History of vitrectomy 12/05/2021   Intermediate stage nonexudative age-related macular degeneration of left eye 05/21/2021   Adult onset vitelliform macular dystrophy  05/03/2020   Dry eyes, bilateral 05/03/2020   Follow-up examination after eye surgery 10/19/2019   Advanced nonexudative age-related macular degeneration of right eye with subfoveal involvement 10/19/2019   Polyp of rectum    Vulval lesion benign, excised 12/22/2016   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   Closed nondisplaced fracture of distal phalanx of right middle finger 02/27/2016   Breast cancer ([place]) 04/30/2015   Abnormal ECG    DOE (dyspnea on exertion) 04/12/2014   MIGRAINE HEADACHE 01/23/2009   GERD 01/23/2009   GASTRITIS 01/23/2009   LOOSE  STOOLS 01/23/2009   ALLERGY 01/23/2009   UTERINE CANCER, HX OF 01/23/2009    ONSET DATE: approx 2.5 months ago  REFERRING DIAG: PT eval/tx for gait training per Phillips Odor, MD   THERAPY DIAG:  Difficulty in walking, not elsewhere classified  Repeated falls  Other abnormalities of gait and mobility  Rationale for Evaluation and Treatment Rehabilitation  SUBJECTIVE: pt feels she is improving between the PT and the water aerobics.  Denies use of cane as she tried it and feels she does not need one (suggested by last therapist). states no dizziness but some unsteadiness with walking (describes as "top heavy").  Pt reports if she slows down and pays attention to what she's doing she does fine.                                                                                                 PAIN:  Are you having pain? No  PRECAUTIONS: None  WEIGHT BEARING RESTRICTIONS No  FALLS: Has patient fallen in last 6 months? Yes. 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   MRI IMPRESSION: 1. No acute intracranial abnormality. 2. Mild chronic microvascular ischemic changes of the white matter.    COGNITION: Overall cognitive status: Within functional limits for tasks assessed   SENSATION: WFL  COORDINATION: FNF and Heel knee shin WNL   DTRs:  Biceps 2+ = Normal, Brachioradialis 2+ = Normal, Triceps 1 = Trace , and Patella 2+ = Normal  Hoffman's absent, no tone or clonus noted Head impulse test + Left   LOWER EXTREMITY ROM:      wfl  LOWER EXTREMITY MMT:    MMT Right Eval Left Eval  Hip flexion 4+ 4+  Hip extension    Hip abduction 5 5  Hip adduction    Hip internal rotation    Hip external rotation    Knee flexion    Knee extension 5 5  Ankle dorsiflexion 5 5  Ankle plantarflexion    Ankle inversion    Ankle eversion    (Blank rows = not tested)  TRANSFERS: Independent  GAIT: Gait pattern: step through pattern, decreased stance time- Left, decreased stride length, scissoring, trendelenburg, and narrow BOS Distance walked: 294 Assistive device utilized: None Level of assistance: Modified independence Comments: Intermittent stagger towards right side, intermittent scissoring, able to self correct  FUNCTIONAL TESTs:  5 times sit to stand: 13.6 seconds 2 minute walk test: 294 feet no AD Berg Balance Scale: 41/56    BERG  1. SITTING TO STANDING  4 able to stand without using hands and stabilize independently  2. STANDING UNSUPPORTED  4 able to stand safely for 2 minutes  If a subject is able to stand 2 minutes unsupported, score full points for sitting unsupported. Proceed to item #4  3. SITTING WITH BACK UNSUPPORTED BUT FEET SUPPORTED ON FLOOR OR ON A STOOL 4 able to sit safely and securely for 2 minutes  4. STANDING TO SITTING 4 sits safely with minimal use of hands  5. TRANSFERS 4 able to transfer safely with minor use of hands  6. STANDING UNSUPPORTED WITH EYES CLOSED  3 able to stand 10 seconds with supervision  7. STANDING UNSUPPORTED WITH FEET TOGETHER  3 able to place feet together independently and stand 1 minute with supervision  8. REACHING FORWARD WITH OUTSTRETCHED ARM WHILE STANDING  3 can reach forward 12 cm (5 inches)  9. PICK UP OBJECT FROM THE FLOOR FROM A STANDING POSITION  3 able to pick up slipper but needs supervision  10. TURNING TO LOOK BEHIND OVER LEFT AND RIGHT SHOULDERS WHILE STANDING 1 needs supervision when turning  11. TURN 360 DEGREES 1  needs close supervision or verbal cuing  12. PLACE ALTERNATE FOOT ON STEP OR STOOL WHILE STANDING UNSUPPORTED 2 able to complete 4 steps without aid with supervision  13. STANDING UNSUPPORTED ONE FOOT IN FRONT  3 able to place foot ahead independently and hold 30 seconds   14. STANDING ON ONE LEG 2 able to lift leg independently and hold = 3 seconds  TOTAL SCORE  41/56 History of falls and BBS < 51, or no history of falls and BBS < 42 is predictive of falls ([place], 1997    Orthostatics: (03/18/22) Supine 154/69 HR69 Seated 142/69 HR71 Standing 150/70 HR 73 Standing at 3 minutes 145/72 HR 70  Dix-hallpike test: (03/18/22) Negative Rt, Rt beating nystagmus in Lt dix-hallpike position <30 second, very small amplitude, no rotational component, +dizziness, very mild  Horizontal roll test: (03/18/22) apogeotropic nystagums in left sidelying <15 seconds, very small amplitude     PATIENT SURVEYS:   DHI Total Score: 34 / 100 Physical Score: 8 / 28 Emotional Score: 8 / 36 Functional Score: 18 / 36  TODAY'S TREATMENT:  04/15/22 Heel raises 20X Toe raises 20X Alternating high march no UE 2X10 Squats no UE 2X10 Hip abduction GTB at calf 2X10 Hip Extension GTB at calf 2X10 Side steppine with GTB at calf 3Rt no UE assist Lateral stepping with heel tap 4" 2X10 with no UE assist Vectors with 1 HHA 10X5" each Paloff BTB 10X each direction with NBOS [place] 1 flight (10 steps)  04/10/22 Heel raises on incline 20X Toe raises on incline 20X Alternating high march, no UE 10X 2 sets Standing hip abduction GTB 2 x 10 each band at calf Standing hip extension GTB 2 x10 each band at calf Lateral stepping with mini squat 6 x 15 feet bilateral  Retro gait holding black TB x 10 Palof press with NBOS 2x 10 black TB Stairs 2 x  10 steps 7 inch  04/03/22 All below with minimal, intermittent HHA (mostly tapping) Heel raises on incline 20X Toe raises on incline 20X Alternating high  march, no UE 10X 2 sets Standing hip abduction GTB 2 x 10 each (at thighs) Standing hip extension GTB 2 x10 each (at thighs) 4" forward step ups 2X10 each 4" lateral step ups 2X10 each Squats 2X10 in good form Forward lunges onto 4" step 2X10 each no UE assist. Tandem stance each LE lead with 2# cane lift rotate to Right; center ; rotate to Left; return 10X each SLS max challenge on each LE X5 trials Rt: 12", Lt: 8"   04/01/22 [place] bilateral (negative)  Single leg balance 3 x 10" with int HHA Single leg vectors 3 x 5" each with int HHA Standing hip abduction GTB 2 x 10 each Standing hip extension GTB 2 x10 each  Sit to stand x10     PATIENT EDUCATION: Education details: 03/20/22: HEP; EVAL: Findings, safety, POC Person educated: Patient Education method: Explanation Education comprehension: verbalized understanding   HOME EXERCISE PROGRAM: Access Code: [email or web address] URL: [email or web address]  04/01/22   - Hip Abduction with Resistance Loop  - 2 x daily - 7 x weekly - 2 sets - 10 reps - Hip Extension with Resistance Loop  - 2 x daily - 7 x weekly - 2 sets - 10 reps - Sit to Stand Without Arm Support  - 2 x daily - 7 x weekly - 1-2 sets - 10 reps - Standing 3-Way Kick  - 2 x daily - 7 x weekly - 1 sets - 10 reps - 3-5 second hold  9/28 - Side Stepping with Counter Support  - 3 x daily - 7 x weekly - 4 sets - 10 reps - Single Leg Stance with Support  - 3 x daily - 7 x weekly - 3 reps - 30 second hold  Date: 03/18/2022 Prepared by: Candie Mile  Exercises - Standing Tandem Balance with Counter Support  - 3 x daily - 7 x weekly - 3 sets - 15 seconds hold - Standing March with Counter Support  - 3 x daily - 7 x weekly - 3 sets - 10 reps - Heel Toe Raises with Counter Support  - 3 x daily - 7 x weekly - 3 sets - 10 reps - Gaze Stabilty (VOR) x1 Feet Together on Firm Ground With Horizontal Head Turns  - 3 x daily - 7 x weekly - 3 sets - 30 seconds  hold    GOALS: Goals reviewed with patient? Yes  SHORT TERM GOALS: Target date: 04/03/22  Patient will be independent with initial HEP and self-management strategies to improve functional outcomes Baseline:  Goal status: IN PROGRESS    LONG TERM GOALS: Target date: 04/24/22  Patient will be independent with advanced HEP and self-management strategies to improve functional outcomes Baseline:  Goal status: IN PROGRESS  2.  Patient will reduce Dizziness Handicap Inventory score by at least 4 points to indicate improvement in functional outcomes. (([place]). A four-point change in the DHI-S would be statistically significant at the p = 0.05 level) Baseline: 34 / 100 Goal status: IN PROGRESS  3.  Patient will reduce fall risk based on BERG balance score 51 or greater (in patients with hx of falls. Baseline: 41/56 Goal status: IN PROGRESS  4. 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 Baseline: Reports 80% back to baseline Goal status: IN PROGRESS  5. Patient will be able to ambulate at least 325 feet during 2MWT with LRAD to demonstrate improved ability to perform functional mobility and associated tasks. Baseline: 294 feet no AD Goal status: IN PROGRESS   ASSESSMENT:  CLINICAL IMPRESSION: Continued with standing strengthening and stability challenges.  Pt with cues for general form with hip abduction as tends to turn foot outward.  Able to complete all exercises without use of UE with exception of LE theraband exercises.  Heel taps challenging with noted gluteal weakness and inability to "dangle" LE having to tap it up for stability.  Lt with more weakness than Rt.  Intermittent unsteadiness with static and dynamic balance but without loss of balance. Moderate fatigue and SOB with activities requiring intermittent rest breaks throughout  session.  Added vectors and paloff activities were challenging as well. Patient will continue to benefit from physical therapy in order to improve function and reduce impairment.    OBJECTIVE IMPAIRMENTS Abnormal gait, decreased activity tolerance, decreased balance, decreased endurance, decreased knowledge of condition, decreased mobility, difficulty walking, and dizziness.   ACTIVITY LIMITATIONS carrying, standing, stairs, and locomotion level  PARTICIPATION LIMITATIONS: meal prep, cleaning, laundry, shopping, community activity, occupation, and yard work  PERSONAL FACTORS Age and Time since onset of injury/illness/exacerbation are also affecting patient's functional outcome.   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 Complete 10th visit PN next session.  11:12 AM, 04/15/22 Teena Irani, PTA/CLT [place] Ph: [phone]

## 2022-04-17 ENCOUNTER — Ambulatory Visit (HOSPITAL_COMMUNITY): Payer: Medicare HMO | Admitting: Physical Therapy

## 2022-04-17 DIAGNOSIS — R296 Repeated falls: Secondary | ICD-10-CM

## 2022-04-17 DIAGNOSIS — R262 Difficulty in walking, not elsewhere classified: Secondary | ICD-10-CM

## 2022-04-17 DIAGNOSIS — R2689 Other abnormalities of gait and mobility: Secondary | ICD-10-CM

## 2022-04-17 NOTE — Therapy (Signed)
OUTPATIENT PHYSICAL THERAPY NEURO TREATMENT Progress Note Reporting Period 03/13/22 to 04/17/22  See note below for Objective Data and Assessment of Progress/Goals.      Patient Name: Kayla Thomas MRN: [id number] DOB:[birth year], 81 y.o., female Today's Date: 04/17/2022   PCP: Asencion Noble, MD REFERRING PROVIDER: Phillips Odor, MD    PT End of Session - 04/17/22 1152     Visit Number 10    Number of Visits 12    Date for PT Re-Evaluation 04/24/22    Authorization Type [employer] HMO (no auth, no VL)    Progress Note Due on Visit 10    PT Start Time 1114    PT Stop Time 1200    PT Time Calculation (min) 46 min    Activity Tolerance Patient tolerated treatment well    Behavior During Therapy [facility] for tasks assessed/performed                Past Medical History:  Diagnosis Date   Breast cancer ([place])    Breast cancer ([place])    Chronic back pain    Gastric ulcer    History   GERD (gastroesophageal reflux disease) 01/21/2006   EGD Dr Oneida Alar mild chronic gastritis, (NO h pylori) otherwise normal   Headache(784.0)    Sinus congestion    Thumb tendonitis September 2014   left   Uterine cancer ([place]) 1998   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   Status post vitrectomy March 2021 revealed resolved VM traction   Wears partial dentures    Past Surgical History:  Procedure Laterality Date   ABDOMINAL HYSTERECTOMY     ABSCESS DRAINAGE  03-04-05   insect bite   CARDIAC CATHETERIZATION N/A 10/31/2014   Procedure: Right/Left Heart Cath and Coronary Angiography;  Surgeon: Troy Sine, MD;  Location: [place] [facility];  Service: Cardiovascular;  Laterality: N/A;   CATARACT EXTRACTION W/PHACO  10/06/2011   Procedure: CATARACT EXTRACTION PHACO AND INTRAOCULAR LENS PLACEMENT (IOC);  Surgeon: Williams Che, MD;  Location: [facility];  Service: Ophthalmology;  Laterality: Left;  CDE:  5.76   cataracts     CHOLECYSTECTOMY  11/1999   COLONOSCOPY  05/13/2011    Procedure: COLONOSCOPY;  Surgeon: Dorothyann Peng, MD;  Location: [facility];  Service: Endoscopy;  Laterality: N/A;  8:30   COLONOSCOPY N/A 04/09/2017   Dr. Oneida Alar: single tubular adenoma removed from the colon, redundant left colon, internal and external hemorrhoids, next colonoscopy in 10 to 15 years.   ESOPHAGOGASTRODUODENOSCOPY  01/21/06   mild antral erythema bx h-pylori/normal esophagus without evidence of mass or Barrett's/normal pylorus and duodenum   PARTIAL MASTECTOMY WITH NEEDLE LOCALIZATION AND AXILLARY SENTINEL LYMPH NODE BX Left 08/18/2012   Procedure: PARTIAL MASTECTOMY WITH NEEDLE LOCALIZATION AND AXILLARY SENTINEL LYMPH NODE BX;  Surgeon: Jamesetta So, MD;  Location: [facility];  Service: General;  Laterality: Left;  Need Frozen Section/Sentinel Node Bx @ 8:00am/Needle Loc @ 9:00am   POLYPECTOMY  04/09/2017   Procedure: POLYPECTOMY;  Surgeon: Danie Binder, MD;  Location[facility];  Service: Endoscopy;;  Rectal   S/P Hysterectomy  1998   uterine ca   temperal/mandibular  [place] Right 05/04/2014   Procedure: RELEASE TRIGGER FINGER/A-1 PULLEY RIGHT RING FINGER;  Surgeon: Daryll Brod, MD;  Location: [place];  Service: Orthopedics;  Laterality: Right;   Patient Active Problem List   Diagnosis Date Noted   History  of vitrectomy 12/05/2021   Intermediate stage nonexudative age-related macular degeneration of left eye 05/21/2021   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   Dry eyes, bilateral 05/03/2020   Follow-up examination after eye surgery 10/19/2019   Advanced nonexudative age-related macular degeneration of right eye with subfoveal involvement 10/19/2019   Polyp of rectum    Vulval lesion benign, excised 12/22/2016   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   Closed nondisplaced fracture of distal phalanx of right middle finger 02/27/2016   Breast cancer ([place]) 04/30/2015   Abnormal  ECG    DOE (dyspnea on exertion) 04/12/2014   MIGRAINE HEADACHE 01/23/2009   GERD 01/23/2009   GASTRITIS 01/23/2009   LOOSE STOOLS 01/23/2009   ALLERGY 01/23/2009   UTERINE CANCER, HX OF 01/23/2009    ONSET DATE: approx 2.5 months ago  REFERRING DIAG: PT eval/tx for gait training per Phillips Odor, MD   THERAPY DIAG:  Difficulty in walking, not elsewhere classified  Repeated falls  Other abnormalities of gait and mobility  Rationale for Evaluation and Treatment Rehabilitation  SUBJECTIVE: pt feels she has improved significantly between the PT and the water aerobics.  Denies dizziness but some unsteadiness with walking (describes as "top heavy").  rts                                                                                             PAIN:  Are you having pain? No  PRECAUTIONS: None  WEIGHT BEARING RESTRICTIONS No  FALLS: Has patient fallen in last 6 months? Yes. 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   MRI IMPRESSION: 1. No acute intracranial abnormality. 2. Mild chronic microvascular ischemic changes of the white matter.    COGNITION: Overall cognitive status: Within functional limits for tasks assessed   SENSATION: WFL  COORDINATION: FNF and Heel knee shin WNL   DTRs:  Biceps 2+ = Normal, Brachioradialis 2+ = Normal, Triceps 1 = Trace , and Patella 2+ = Normal  Hoffman's absent, no tone or clonus noted Head impulse test + Left   LOWER EXTREMITY ROM:     wfl  LOWER EXTREMITY MMT:    MMT  Right Eval Left Eval  Hip flexion 4+ 4+  Hip extension    Hip abduction 5 5  Hip adduction    Hip internal rotation    Hip external rotation    Knee flexion    Knee extension 5 5  Ankle dorsiflexion 5 5  Ankle plantarflexion    Ankle inversion    Ankle eversion    (Blank rows = not tested)    TRANSFERS: Independent  GAIT: Gait pattern: step through pattern, decreased stance time- Left, decreased stride length,  scissoring, trendelenburg, and narrow BOS Distance walked: 294 Assistive device utilized: None Level of assistance: Modified independence Comments: Intermittent stagger towards right side, intermittent scissoring, able to self correct  FUNCTIONAL TESTs: Evaluation 5 times sit to stand: 13.6 seconds 2 minute walk test: 294 feet no AD Berg Balance Scale: 41/56  Retested measures on 04/17/22; see below   BERG completed 04/17/22  1. SITTING TO STANDING  4 able to stand without using hands and stabilize independently  2. STANDING UNSUPPORTED  4 able to stand safely for 2 minutes  If a subject is able to stand 2 minutes unsupported, score full points for sitting unsupported. Proceed to item #4  3. SITTING WITH BACK UNSUPPORTED BUT FEET SUPPORTED ON FLOOR OR ON A STOOL 4 able to sit safely and securely for 2 minutes  4. STANDING TO SITTING 4 sits safely with minimal use of hands  5. TRANSFERS 4 able to transfer safely with minor use of hands  6. STANDING UNSUPPORTED WITH EYES CLOSED 4 able to stand 10 seconds safely   7. STANDING UNSUPPORTED WITH FEET TOGETHER 4 able to place feet together independently and stand 1 minute safely  8. REACHING FORWARD WITH OUTSTRETCHED ARM WHILE STANDING 4 can reach forward confidently 25 cm (10 inches)  9. PICK UP OBJECT FROM THE FLOOR FROM A STANDING POSITION 4 able to pick up slipper safely and easily  10. TURNING TO LOOK BEHIND OVER LEFT AND RIGHT SHOULDERS WHILE STANDING  3 looks behind one side only other side  shows less weight shift  11. TURN 360 DEGREES 3 able to turn 360 degrees safely one side only 4 seconds or less  12. PLACE ALTERNATE FOOT ON STEP OR STOOL WHILE STANDING UNSUPPORTED  4 able to stand independently and safely and complete 8 steps in 20 seconds  13. STANDING UNSUPPORTED ONE FOOT IN FRONT 4 able to place foot tandem independently and hold 30 seconds  14. STANDING ON ONE LEG  4 able to lift leg independently and hold > 10 seconds  TOTAL SCORE  54/56 41-56 = independent   (was 41/56)    Orthostatics: (03/18/22) Supine 154/69 HR69 Seated 142/69 HR71 Standing 150/70 HR 73 Standing at 3 minutes 145/72 HR 70  Dix-hallpike test: (03/18/22) Negative Rt, Rt beating nystagmus in Lt dix-hallpike position <30 second, very small amplitude, no rotational component, +dizziness, very mild  Horizontal roll test: (03/18/22) apogeotropic nystagums in left sidelying <15 seconds, very small amplitude     PATIENT SURVEYS:   DHI Total Score: 34 / 100 Physical Score: 8 / 28 Emotional Score: 8 / 36 Functional Score: 18 / 36  TODAY'S TREATMENT:  04/17/22 2 minute walk test: 356 no AD (was 294 feet no AD) Berg Balance Scale: 44/46 (was 41/56) see above SLS 15" without UE, tandem 30" without UE Goal review Standing:  alternating high march no UE 2X10 slow, controlled and hold   Vectors 10X5" each with intermittent 1 HHA  Side stepping in semisquat 12 feet 2RT no UE assist  04/15/22 Heel raises 20X Toe raises 20X Alternating high march no UE 2X10 Squats no UE 2X10 Hip abduction GTB at calf 2X10 Hip Extension GTB at calf 2X10 Side stepping with GTB at calf 3Rt no UE assist Lateral stepping with heel tap 4" 2X10 with no UE assist Vectors with 1 HHA 10X5" each [place] BTB 10X each direction with NBOS [place] 1 flight (10 steps)  04/10/22 Heel raises on incline 20X Toe raises on incline 20X Alternating high  march, no UE 10X 2 sets Standing hip abduction GTB 2 x 10 each  band at calf Standing hip extension GTB 2 x10 each band at calf Lateral stepping with mini squat 6 x 15 feet bilateral  Retro gait holding black TB x 10 Palof press with NBOS 2x 10 black TB Stairs 2 x 10 steps 7 inch  04/03/22 All below with minimal, intermittent HHA (mostly tapping) Heel raises on incline 20X Toe raises on incline 20X Alternating high march, no UE 10X 2 sets Standing hip abduction GTB 2 x 10 each (at thighs) Standing hip extension GTB 2 x10 each (at thighs) 4" forward step ups 2X10 each 4" lateral step ups 2X10 each Squats 2X10 in good form Forward lunges onto 4" step 2X10 each no UE assist. Tandem stance each LE lead with 2# cane lift rotate to Right; center ; rotate to Left; return 10X each SLS max challenge on each LE X5 trials Rt: 12", Lt: 8"   04/01/22 [place] bilateral (negative)  Single leg balance 3 x 10" with int HHA Single leg vectors 3 x 5" each with int HHA Standing hip abduction GTB 2 x 10 each Standing hip extension GTB 2 x10 each  Sit to stand x10     PATIENT EDUCATION: Education details: 03/20/22: HEP; EVAL: Findings, safety, POC Person educated: Patient Education method: Explanation Education comprehension: verbalized understanding   HOME EXERCISE PROGRAM: Access Code: [email or web address] URL: [email or web address]  04/01/22   - Hip Abduction with Resistance Loop  - 2 x daily - 7 x weekly - 2 sets - 10 reps - Hip Extension with Resistance Loop  - 2 x daily - 7 x weekly - 2 sets - 10 reps - Sit to Stand Without Arm Support  - 2 x daily - 7 x weekly - 1-2 sets - 10 reps - Standing 3-Way Kick  - 2 x daily - 7 x weekly - 1 sets - 10 reps - 3-5 second hold  9/28 - Side Stepping with Counter Support  - 3 x daily - 7 x weekly - 4 sets - 10 reps - Single Leg Stance with Support  - 3 x daily - 7 x weekly - 3 reps - 30 second hold  Date: 03/18/2022 Prepared by: Candie Mile Exercises - Standing Tandem Balance with Counter Support  - 3 x  daily - 7 x weekly - 3 sets - 15 seconds hold - Standing March with Counter Support  - 3 x daily - 7 x weekly - 3 sets - 10 reps - Heel Toe Raises with Counter Support  - 3 x daily - 7 x weekly - 3 sets - 10 reps - Gaze Stabilty (VOR) x1 Feet Together on Firm Ground With Horizontal Head Turns  - 3 x daily - 7 x weekly - 3 sets - 30 seconds hold  GOALS: Goals reviewed with patient? Yes  SHORT TERM GOALS: Target date: 04/03/22  Patient will be independent with initial HEP and self-management strategies to improve functional outcomes Baseline:  Goal status: MET   LONG TERM GOALS: Target date: 04/24/22  Patient will be independent with advanced HEP and self-management strategies to improve functional outcomes Baseline:  Goal status: IN PROGRESS  2.  Patient will reduce Dizziness Handicap Inventory score by at least 4 points to indicate improvement in functional outcomes. (([place]). A four-point change in the DHI-S would be statistically significant at the p = 0.05 level) Baseline: 34 / 100 Goal status: IN PROGRESS  3.  Patient will reduce fall risk based on BERG balance score 51 or greater (in patients with hx of falls. Baseline: 41/56 Goal status: MET  4. 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 Baseline: Reports 80% back to baseline  Goal status: MET  5. Patient will be able to ambulate at least 325 feet during 2MWT with LRAD to demonstrate improved ability to perform functional mobility and associated tasks. Baseline: 294 feet no AD Goal status: MET  ASSESSMENT:  CLINICAL IMPRESSION: Progress note completed this session.  PT feels she is 95% improved and has met all goals at this point with exception of advanced HEP.  This therapist also was unable to assess dizziness handicap inventory score.  Pt has progressed well and feels her  dizziness and overall balance have improved substantially.  Pt continues to require cues for general form/posturing with exercises as well as hold times.  Pt reports compliance with most of her exercises at home.  Pt verbalizes she would like to continue her last 2 scheduled appts  Continues to present with moderate fatigue/SOB requiring intermittent rest breaks throughout session.  Patient will continue to benefit from physical therapy in order to improve function and reduce impairment.    OBJECTIVE IMPAIRMENTS Abnormal gait, decreased activity tolerance, decreased balance, decreased endurance, decreased knowledge of condition, decreased mobility, difficulty walking, and dizziness.   ACTIVITY LIMITATIONS carrying, standing, stairs, and locomotion level  PARTICIPATION LIMITATIONS: meal prep, cleaning, laundry, shopping, community activity, occupation, and yard work  PERSONAL FACTORS Age and Time since onset of injury/illness/exacerbation are also affecting patient's functional outcome.   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 Complete last 2 sessions with advanced HEP updates as needed; complete dizziness handicap inventory scale.  12:12 PM, 04/17/22 Teena Irani, PTA/CLT [place] Ph: [phone]

## 2022-04-22 ENCOUNTER — Encounter (HOSPITAL_COMMUNITY): Payer: Self-pay | Admitting: Physical Therapy

## 2022-04-22 ENCOUNTER — Ambulatory Visit (HOSPITAL_COMMUNITY): Payer: Medicare HMO | Admitting: Physical Therapy

## 2022-04-22 DIAGNOSIS — R2689 Other abnormalities of gait and mobility: Secondary | ICD-10-CM

## 2022-04-22 DIAGNOSIS — R262 Difficulty in walking, not elsewhere classified: Secondary | ICD-10-CM | POA: Diagnosis not present

## 2022-04-22 DIAGNOSIS — R296 Repeated falls: Secondary | ICD-10-CM

## 2022-04-22 NOTE — Therapy (Signed)
OUTPATIENT PHYSICAL THERAPY NEURO TREATMENT Progress Note Reporting Period 03/13/22 to 04/17/22  See note below for Objective Data and Assessment of Progress/Goals.      Patient Name: Kayla Thomas MRN: [id number] DOB:Apr 17, 1941, 81 y.o., female Today's Date: 04/22/2022   PCP: Asencion Noble, MD REFERRING PROVIDER: Phillips Odor, MD    PT End of Session - 04/22/22 1121     Visit Number 11    Number of Visits 12    Date for PT Re-Evaluation 04/24/22    Authorization Type [employer] HMO (no auth, no VL)    Progress Note Due on Visit 10    PT Start Time 1120    PT Stop Time 7510    PT Time Calculation (min) 38 min    Activity Tolerance Patient tolerated treatment well    Behavior During Therapy WFL for tasks assessed/performed                Past Medical History:  Diagnosis Date   Breast cancer ([place])    Breast cancer ([place])    Chronic back pain    Gastric ulcer    History   GERD (gastroesophageal reflux disease) 01/21/2006   EGD Dr Oneida Alar mild chronic gastritis, (NO h pylori) otherwise normal   Headache(784.0)    Sinus congestion    Thumb tendonitis September 2014   left   Uterine cancer ([place]) 1998   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   Status post vitrectomy March 2021 revealed resolved VM traction   Wears partial dentures    Past Surgical History:  Procedure Laterality Date   ABDOMINAL HYSTERECTOMY     ABSCESS DRAINAGE  03-04-05   insect bite   CARDIAC CATHETERIZATION N/A 10/31/2014   Procedure: Right/Left Heart Cath and Coronary Angiography;  Surgeon: Troy Sine, MD;  Location: [facility];  Service: Cardiovascular;  Laterality: N/A;   CATARACT EXTRACTION W/PHACO  10/06/2011   Procedure: CATARACT EXTRACTION PHACO AND INTRAOCULAR LENS PLACEMENT (IOC);  Surgeon: Williams Che, MD;  Location: [facility];  Service: Ophthalmology;  Laterality: Left;  CDE:  5.76   cataracts     CHOLECYSTECTOMY  11/1999   COLONOSCOPY  05/13/2011    Procedure: COLONOSCOPY;  Surgeon: Dorothyann Peng, MD;  Location: [facility];  Service: Endoscopy;  Laterality: N/A;  8:30   COLONOSCOPY N/A 04/09/2017   Dr. Oneida Alar: single tubular adenoma removed from the colon, redundant left colon, internal and external hemorrhoids, next colonoscopy in 10 to 15 years.   ESOPHAGOGASTRODUODENOSCOPY  01/21/06   mild antral erythema bx h-pylori/normal esophagus without evidence of mass or Barrett's/normal pylorus and duodenum   PARTIAL MASTECTOMY WITH NEEDLE LOCALIZATION AND AXILLARY SENTINEL LYMPH NODE BX Left 08/18/2012   Procedure: PARTIAL MASTECTOMY WITH NEEDLE LOCALIZATION AND AXILLARY SENTINEL LYMPH NODE BX;  Surgeon: Jamesetta So, MD;  Location: [facility];  Service: General;  Laterality: Left;  Need Frozen Section/Sentinel Node Bx @ 8:00am/Needle Loc @ 9:00am   POLYPECTOMY  04/09/2017   Procedure: POLYPECTOMY;  Surgeon: Danie Binder, MD;  Location: [facility];  Service: Endoscopy;;  Rectal   S/P Hysterectomy  1998   uterine ca   temperal/mandibular  [place] Right 05/04/2014   Procedure: RELEASE TRIGGER FINGER/A-1 PULLEY RIGHT RING FINGER;  Surgeon: Daryll Brod, MD;  Location: [place] [facility];  Service: Orthopedics;  Laterality: Right;   Patient Active Problem List   Diagnosis Date Noted   History  of vitrectomy 12/05/2021   Intermediate stage nonexudative age-related macular degeneration of left eye 05/21/2021   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   Dry eyes, bilateral 05/03/2020   Follow-up examination after eye surgery 10/19/2019   Advanced nonexudative age-related macular degeneration of right eye with subfoveal involvement 10/19/2019   Polyp of rectum    Vulval lesion benign, excised 12/22/2016   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   Closed nondisplaced fracture of distal phalanx of right middle finger 02/27/2016   Breast cancer ([place]) 04/30/2015   Abnormal  ECG    DOE (dyspnea on exertion) 04/12/2014   MIGRAINE HEADACHE 01/23/2009   GERD 01/23/2009   GASTRITIS 01/23/2009   LOOSE STOOLS 01/23/2009   ALLERGY 01/23/2009   UTERINE CANCER, HX OF 01/23/2009    ONSET DATE: approx 2.5 months ago  REFERRING DIAG: PT eval/tx for gait training per Phillips Odor, MD   THERAPY DIAG:  Difficulty in walking, not elsewhere classified  Repeated falls  Other abnormalities of gait and mobility  Rationale for Evaluation and Treatment Rehabilitation  SUBJECTIVE: Doing well. No major issues. No falls. Still feels a little wobbly sometimes but doing much better overall.                                                                                    PAIN:  Are you having pain? No  PRECAUTIONS: None  WEIGHT BEARING RESTRICTIONS No  FALLS: Has patient fallen in last 6 months? Yes. 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   MRI IMPRESSION: 1. No acute intracranial abnormality. 2. Mild chronic microvascular ischemic changes of the white matter.    COGNITION: Overall cognitive status: Within functional limits for tasks assessed   SENSATION: WFL  COORDINATION: FNF and Heel knee shin WNL   DTRs:  Biceps 2+ = Normal, Brachioradialis 2+ = Normal, Triceps 1 = Trace , and Patella 2+ = Normal  Hoffman's absent, no tone or clonus noted Head impulse test + Left   LOWER EXTREMITY ROM:     wfl  LOWER EXTREMITY MMT:    MMT Right Eval Left Eval  Hip flexion 4+ 4+  Hip extension    Hip  abduction 5 5  Hip adduction    Hip internal rotation    Hip external rotation    Knee flexion    Knee extension 5 5  Ankle dorsiflexion 5 5  Ankle plantarflexion    Ankle inversion    Ankle eversion    (Blank rows = not tested)    TRANSFERS: Independent  GAIT: Gait pattern: step through pattern, decreased stance time- Left, decreased stride length, scissoring, trendelenburg, and narrow BOS Distance walked: 294 Assistive device utilized: None Level of assistance: Modified independence Comments:  Intermittent stagger towards right side, intermittent scissoring, able to self correct  FUNCTIONAL TESTs: Evaluation 5 times sit to stand: 13.6 seconds 2 minute walk test: 294 feet no AD Berg Balance Scale: 41/56  Retested measures on 04/17/22; see below   BERG completed 04/17/22  1. SITTING TO STANDING  4 able to stand without using hands and stabilize independently  2. STANDING UNSUPPORTED  4 able to stand safely for 2 minutes  If a subject is able to stand 2 minutes unsupported, score full points for sitting unsupported. Proceed to item #4  3. SITTING WITH BACK UNSUPPORTED BUT FEET SUPPORTED ON FLOOR OR ON A STOOL 4 able to sit safely and securely for 2 minutes  4. STANDING TO SITTING 4 sits safely with minimal use of hands  5. TRANSFERS 4 able to transfer safely with minor use of hands  6. STANDING UNSUPPORTED WITH EYES CLOSED 4 able to stand 10 seconds safely   7. STANDING UNSUPPORTED WITH FEET TOGETHER 4 able to place feet together independently and stand 1 minute safely  8. REACHING FORWARD WITH OUTSTRETCHED ARM WHILE STANDING 4 can reach forward confidently 25 cm (10 inches)  9. PICK UP OBJECT FROM THE FLOOR FROM A STANDING POSITION 4 able to pick up slipper safely and easily  10. TURNING TO LOOK BEHIND OVER LEFT AND RIGHT SHOULDERS WHILE STANDING  3 looks behind one side only other side shows less weight shift  11. TURN 360 DEGREES 3 able to turn 360  degrees safely one side only 4 seconds or less  12. PLACE ALTERNATE FOOT ON STEP OR STOOL WHILE STANDING UNSUPPORTED  4 able to stand independently and safely and complete 8 steps in 20 seconds  13. STANDING UNSUPPORTED ONE FOOT IN FRONT 4 able to place foot tandem independently and hold 30 seconds  14. STANDING ON ONE LEG  4 able to lift leg independently and hold > 10 seconds  TOTAL SCORE  54/56 41-56 = independent   (was 41/56)    Orthostatics: (03/18/22) Supine 154/69 HR69 Seated 142/69 HR71 Standing 150/70 HR 73 Standing at 3 minutes 145/72 HR 70  Dix-hallpike test: (03/18/22) Negative Rt, Rt beating nystagmus in Lt dix-hallpike position <30 second, very small amplitude, no rotational component, +dizziness, very mild  Horizontal roll test: (03/18/22) apogeotropic nystagums in left sidelying <15 seconds, very small amplitude     PATIENT SURVEYS:   DHI Total Score: 34 / 100 Physical Score: 8 / 28 Emotional Score: 8 / 36 Functional Score: 18 / 36  TODAY'S TREATMENT:  04/22/22 Heel raise 2 x 10 Toe raise 2 x x10 Squats 2 x 10  Hip abduction GTB 2 x 10 Hip extension GTB 2 x 10 6 inch power up x10 each  Tandem stance on foam 2 x 30" each  Vector stance 3 x 5" each leg  Power ups 2 x 10 each 6 inch    04/17/22 2 minute walk test: 356 no AD (was 294 feet no AD) Berg Balance Scale: 44/46 (was 41/56) see above SLS 15" without UE, tandem 30" without UE Goal review Standing:  alternating high march no UE 2X10 slow, controlled and hold   Vectors 10X5" each with intermittent 1 HHA  Side stepping in semisquat 12 feet 2RT no UE assist  04/15/22 Heel raises 20X Toe raises 20X Alternating high march no UE 2X10 Squats no UE 2X10 Hip abduction GTB at calf 2X10 Hip Extension GTB at calf 2X10 Side stepping with GTB at calf 3Rt no  UE assist Lateral stepping with heel tap 4" 2X10 with no UE assist Vectors with 1 HHA 10X5" each [place] BTB 10X each direction with  NBOS [place] 1 flight (10 steps)     PATIENT EDUCATION: Education details: 03/20/22: HEP; EVAL: Findings, safety, POC Person educated: Patient Education method: Explanation Education comprehension: verbalized understanding   HOME EXERCISE PROGRAM: Access Code: [email or web address] URL: [email or web address]  04/22/22 - Standing Anti-Rotation Press with Anchored Resistance  - 2 x daily - 7 x weekly - 2 sets - 10 reps  04/01/22   - Hip Abduction with Resistance Loop  - 2 x daily - 7 x weekly - 2 sets - 10 reps - Hip Extension with Resistance Loop  - 2 x daily - 7 x weekly - 2 sets - 10 reps - Sit to Stand Without Arm Support  - 2 x daily - 7 x weekly - 1-2 sets - 10 reps - Standing 3-Way Kick  - 2 x daily - 7 x weekly - 1 sets - 10 reps - 3-5 second hold  9/28 - Side Stepping with Counter Support  - 3 x daily - 7 x weekly - 4 sets - 10 reps - Single Leg Stance with Support  - 3 x daily - 7 x weekly - 3 reps - 30 second hold  Date: 03/18/2022 Prepared by: Candie Mile Exercises - Standing Tandem Balance with Counter Support  - 3 x daily - 7 x weekly - 3 sets - 15 seconds hold - Standing March with Counter Support  - 3 x daily - 7 x weekly - 3 sets - 10 reps - Heel Toe Raises with Counter Support  - 3 x daily - 7 x weekly - 3 sets - 10 reps - Gaze Stabilty (VOR) x1 Feet Together on Firm Ground With Horizontal Head Turns  - 3 x daily - 7 x weekly - 3 sets - 30 seconds hold  GOALS: Goals reviewed with patient? Yes  SHORT TERM GOALS: Target date: 04/03/22  Patient will be independent with initial HEP and self-management strategies to improve functional outcomes Baseline:  Goal status: MET   LONG TERM GOALS: Target date: 04/24/22  Patient will be independent with advanced HEP and self-management strategies to improve functional outcomes Baseline:  Goal status: IN PROGRESS  2.  Patient will reduce Dizziness Handicap Inventory score by at least 4 points to indicate  improvement in functional outcomes. (([place]). A four-point change in the DHI-S would be statistically significant at the p = 0.05 level) Baseline: 34 / 100 Goal status: IN PROGRESS  3.  Patient will reduce fall risk based on BERG balance score 51 or greater (in patients with hx of falls. Baseline: 41/56 Goal status: MET  4. 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 Baseline: Reports 80% back to baseline  Goal status: MET  5. Patient will be able to ambulate at least 325 feet during 2MWT with LRAD to demonstrate improved ability to perform functional mobility and associated tasks. Baseline: 294 feet no AD Goal status: MET  ASSESSMENT:  CLINICAL IMPRESSION: Patient tolerated session well today. She was well challenged with balance activity, including added foam to tandem stance and power ups on 6 inch step. She did require finger touch assist to maintain stability. Notably fatigued end of session and did require a few short breaks for rest during treatment.  Patient educated on purpose and function of all added activity today. Patient will continue to benefit from skilled therapy services to reduce remaining deficits and improve functional ability.    OBJECTIVE IMPAIRMENTS Abnormal gait, decreased activity tolerance, decreased balance, decreased endurance, decreased knowledge of condition, decreased mobility, difficulty walking, and dizziness.   ACTIVITY LIMITATIONS carrying, standing, stairs, and locomotion level  PARTICIPATION LIMITATIONS: meal prep, cleaning, laundry, shopping, community activity, occupation, and yard work  PERSONAL FACTORS Age and Time since onset of injury/illness/exacerbation are also affecting patient's functional outcome.   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 Review advanced HEP and update as needed; complete dizziness handicap inventory scale.  11:22 AM, 04/22/22 Josue Hector PT DPT  Physical Therapist with [facility]  [phone]

## 2022-04-24 ENCOUNTER — Ambulatory Visit (HOSPITAL_COMMUNITY): Payer: Medicare HMO | Attending: Neurology | Admitting: Physical Therapy

## 2022-04-24 DIAGNOSIS — R296 Repeated falls: Secondary | ICD-10-CM | POA: Diagnosis present

## 2022-04-24 DIAGNOSIS — R262 Difficulty in walking, not elsewhere classified: Secondary | ICD-10-CM | POA: Insufficient documentation

## 2022-04-24 DIAGNOSIS — R2689 Other abnormalities of gait and mobility: Secondary | ICD-10-CM | POA: Insufficient documentation

## 2022-04-24 NOTE — Therapy (Signed)
OUTPATIENT PHYSICAL THERAPY NEURO TREATMENT Discharge   PHYSICAL THERAPY DISCHARGE SUMMARY  Visits from Start of Care: 12  Current functional level related to goals / functional outcomes: Pt has met all goals    Remaining deficits: Pt still has some instability but has HEP to work on it.   Education / Equipment: HEP   Patient agrees to discharge. Patient goals were met. Patient is being discharged due to meeting the stated rehab goals.    Patient Name: Kayla Thomas MRN: [id number] DOB:08/10/1940, 81 y.o., female Today's Date: 04/24/2022   PCP: Asencion Noble, MD REFERRING PROVIDER: Phillips Odor, MD    PT End of Session - 04/24/22 1343    Visit Number 12    Number of Visits 12    Date for PT Re-Evaluation 04/24/22    Authorization Type [employer] HMO (no auth, no VL)    Progress Note Due on Visit 10    PT Start Time 1300    PT Stop Time 1340    PT Time Calculation (min) 40 min    Activity Tolerance Patient tolerated treatment well    Behavior During Therapy [facility] for tasks assessed/performed                 Past Medical History:  Diagnosis Date   Breast cancer ([place])    Breast cancer ([place])    Chronic back pain    Gastric ulcer    History   GERD (gastroesophageal reflux disease) 01/21/2006   EGD Dr Oneida Alar mild chronic gastritis, (NO h pylori) otherwise normal   Headache(784.0)    Sinus congestion    Thumb tendonitis September 2014   left   Uterine cancer ([place]) 1998   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   Status post vitrectomy March 2021 revealed resolved VM traction   Wears partial dentures    Past Surgical History:  Procedure Laterality Date   ABDOMINAL HYSTERECTOMY     ABSCESS DRAINAGE  03-04-05   insect bite   CARDIAC CATHETERIZATION N/A 10/31/2014   Procedure: Right/Left Heart Cath and Coronary Angiography;  Surgeon: Troy Sine, MD;  Location: [place] [facility];  Service: Cardiovascular;  Laterality: N/A;   CATARACT  EXTRACTION W/PHACO  10/06/2011   Procedure: CATARACT EXTRACTION PHACO AND INTRAOCULAR LENS PLACEMENT (IOC);  Surgeon: Williams Che, MD;  Location: [facility];  Service: Ophthalmology;  Laterality: Left;  CDE:  5.76   cataracts     CHOLECYSTECTOMY  11/1999   COLONOSCOPY  05/13/2011   Procedure: COLONOSCOPY;  Surgeon: Dorothyann Peng, MD;  Location: [facility];  Service: Endoscopy;  Laterality: N/A;  8:30   COLONOSCOPY N/A 04/09/2017   Dr. Oneida Alar: single tubular adenoma removed from the colon, redundant left colon, internal and external hemorrhoids, next colonoscopy in 10 to 15 years.   ESOPHAGOGASTRODUODENOSCOPY  01/21/06   mild antral erythema bx h-pylori/normal esophagus without evidence of mass or Barrett's/normal pylorus and duodenum   PARTIAL MASTECTOMY WITH NEEDLE LOCALIZATION AND AXILLARY SENTINEL LYMPH NODE BX Left 08/18/2012   Procedure: PARTIAL MASTECTOMY WITH NEEDLE LOCALIZATION AND AXILLARY SENTINEL LYMPH NODE BX;  Surgeon: Jamesetta So, MD;  Location: [facility];  Service: General;  Laterality: Left;  Need Frozen Section/Sentinel Node Bx @ 8:00am/Needle Loc @ 9:00am   POLYPECTOMY  04/09/2017   Procedure: POLYPECTOMY;  Surgeon: Danie Binder, MD;  Location[facility];  Service: Endoscopy;;  Rectal   S/P Hysterectomy  1998   uterine ca   temperal/mandibular  [phone]  TONSILLECTOMY  1964   TRIGGER FINGER RELEASE Right 05/04/2014   Procedure: RELEASE TRIGGER FINGER/A-1 PULLEY RIGHT RING FINGER;  Surgeon: Daryll Brod, MD;  Location: [place];  Service: Orthopedics;  Laterality: Right;   Patient Active Problem List   Diagnosis Date Noted   History of vitrectomy 12/05/2021   Intermediate stage nonexudative age-related macular degeneration of left eye 05/21/2021   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   Dry eyes, bilateral 05/03/2020   Follow-up examination after eye surgery 10/19/2019   Advanced nonexudative age-related macular degeneration of right eye  with subfoveal involvement 10/19/2019   Polyp of rectum    Vulval lesion benign, excised 12/22/2016   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   Closed nondisplaced fracture of distal phalanx of right middle finger 02/27/2016   Breast cancer ([place]) 04/30/2015   Abnormal ECG    DOE (dyspnea on exertion) 04/12/2014   MIGRAINE HEADACHE 01/23/2009   GERD 01/23/2009   GASTRITIS 01/23/2009   LOOSE STOOLS 01/23/2009   ALLERGY 01/23/2009   UTERINE CANCER, HX OF 01/23/2009    ONSET DATE: approx 2.5 months ago  REFERRING DIAG: PT eval/tx for gait training per Phillips Odor, MD   THERAPY DIAG:  Difficulty in walking, not elsewhere classified  Repeated falls  Other abnormalities of gait and mobility  Rationale for Evaluation and Treatment Rehabilitation  SUBJECTIVE: Pt states that she feels that she is 50% better since firs starting therapy.                                                                                      PAIN:  Are you having pain? No  PRECAUTIONS: None  WEIGHT BEARING RESTRICTIONS No  FALLS: Has patient fallen in last 6 months? Yes. 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   MRI IMPRESSION: 1. No acute intracranial abnormality. 2. Mild chronic microvascular ischemic changes of the white matter.    COGNITION: Overall cognitive status: Within functional limits for tasks assessed   SENSATION: WFL  COORDINATION: FNF and Heel knee shin WNL   DTRs:  Biceps 2+ = Normal, Brachioradialis 2+ = Normal,  Triceps 1 = Trace , and Patella 2+ = Normal  Hoffman's absent, no tone or clonus noted Head impulse test + Left   LOWER EXTREMITY ROM:     wfl  LOWER EXTREMITY MMT:    MMT Right Eval Left Eval  Hip flexion 4+ 4+  Hip extension    Hip abduction 5 5  Hip adduction    Hip internal rotation    Hip external rotation    Knee flexion    Knee extension 5 5  Ankle dorsiflexion 5 5  Ankle plantarflexion    Ankle inversion  Ankle eversion    (Blank rows = not tested)    TRANSFERS: Independent  GAIT: Gait pattern: step through pattern, decreased stance time- Left, decreased stride length, scissoring, trendelenburg, and narrow BOS Distance walked: 294 Assistive device utilized: None Level of assistance: Modified independence Comments: Intermittent stagger towards right side, intermittent scissoring, able to self correct  FUNCTIONAL TESTs: Evaluation 5 times sit to stand: 13.6 seconds  now 11.3"  2 minute walk test: 294 feet no AD  now 354 ft.  Berg Balance Scale: 41/56 now 56  Retested measures on 04/17/22; see below   BERG completed 04/17/22  1. SITTING TO STANDING  4 able to stand without using hands and stabilize independently  2. STANDING UNSUPPORTED  4 able to stand safely for 2 minutes  If a subject is able to stand 2 minutes unsupported, score full points for sitting unsupported. Proceed to item #4  3. SITTING WITH BACK UNSUPPORTED BUT FEET SUPPORTED ON FLOOR OR ON A STOOL 4 able to sit safely and securely for 2 minutes  4. STANDING TO SITTING 4 sits safely with minimal use of hands  5. TRANSFERS 4 able to transfer safely with minor use of hands  6. STANDING UNSUPPORTED WITH EYES CLOSED 4 able to stand 10 seconds safely   7. STANDING UNSUPPORTED WITH FEET TOGETHER 4 able to place feet together independently and stand 1 minute safely  8. REACHING FORWARD WITH OUTSTRETCHED ARM WHILE STANDING 4 can reach forward confidently 25 cm (10 inches)  9.  PICK UP OBJECT FROM THE FLOOR FROM A STANDING POSITION 4 able to pick up slipper safely and easily  10. TURNING TO LOOK BEHIND OVER LEFT AND RIGHT SHOULDERS WHILE STANDING  3 looks behind one side only other side shows less weight shift  11. TURN 360 DEGREES 3 able to turn 360 degrees safely one side only 4 seconds or less  12. PLACE ALTERNATE FOOT ON STEP OR STOOL WHILE STANDING UNSUPPORTED  4 able to stand independently and safely and complete 8 steps in 20 seconds  13. STANDING UNSUPPORTED ONE FOOT IN FRONT 4 able to place foot tandem independently and hold 30 seconds  14. STANDING ON ONE LEG  4 able to lift leg independently and hold > 10 seconds  TOTAL SCORE  54/56 41-56 = independent   (was 41/56)    Orthostatics: (03/18/22) Supine 154/69 HR69 Seated 142/69 HR71 Standing 150/70 HR 73 Standing at 3 minutes 145/72 HR 70  Dix-hallpike test: (03/18/22) Negative Rt, Rt beating nystagmus in Lt dix-hallpike position <30 second, very small amplitude, no rotational component, +dizziness, very mild  Horizontal roll test: (03/18/22) apogeotropic nystagums in left sidelying <15 seconds, very small amplitude     PATIENT SURVEYS:   DHI Total Score: 34 / 100 currently 16/100 Physical Score: 8 / 28 Emotional Score: 8 / 36 Functional Score: 18 / 36  TODAY'S TREATMENT:  04/25/2022 PWR: UP, ROCK, Twist and step x 7 each Tandem stance with head turns and nods x 5 with each leg forward Vector stance x 10" x 3   04/22/22 Heel raise 2 x 10 Toe raise 2 x x10 Squats 2 x 10  Hip abduction GTB 2 x 10 Hip extension GTB 2 x 10 6 inch power up x10 each  Tandem stance on foam 2 x 30" each  Vector stance 3 x 5" each leg  Power ups 2 x 10 each 6 inch    04/17/22 2 minute walk test: 356 no AD (was 294  feet no AD) Berg Balance Scale: 44/46 (was 41/56) see above SLS 15" without UE, tandem 30" without UE Goal review            Standing:  alternating high march no UE 2X10 slow,  controlled and hold   Vectors 10X5" each with intermittent 1 HHA  Side stepping in semisquat 12 feet 2RT no UE assist  04/15/22 Heel raises 20X Toe raises 20X Alternating high march no UE 2X10 Squats no UE 2X10 Hip abduction GTB at calf 2X10 Hip Extension GTB at calf 2X10 Side stepping with GTB at calf 3Rt no UE assist Lateral stepping with heel tap 4" 2X10 with no UE assist Vectors with 1 HHA 10X5" each [place] BTB 10X each direction with NBOS [place] 1 flight (10 steps)     PATIENT EDUCATION: Education details: 03/20/22: HEP; EVAL: Findings, safety, POC Person educated: Patient Education method: Explanation Education comprehension: verbalized understanding   HOME EXERCISE PROGRAM: Access Code: [email or web address] URL: [email or web address]  04/22/22 - Standing Anti-Rotation Press with Anchored Resistance  - 2 x daily - 7 x weekly - 2 sets - 10 reps  04/01/22   - Hip Abduction with Resistance Loop  - 2 x daily - 7 x weekly - 2 sets - 10 reps - Hip Extension with Resistance Loop  - 2 x daily - 7 x weekly - 2 sets - 10 reps - Sit to Stand Without Arm Support  - 2 x daily - 7 x weekly - 1-2 sets - 10 reps - Standing 3-Way Kick  - 2 x daily - 7 x weekly - 1 sets - 10 reps - 3-5 second hold  9/28 - Side Stepping with Counter Support  - 3 x daily - 7 x weekly - 4 sets - 10 reps - Single Leg Stance with Support  - 3 x daily - 7 x weekly - 3 reps - 30 second hold  Date: 03/18/2022 Prepared by: Candie Mile Exercises - Standing Tandem Balance with Counter Support  - 3 x daily - 7 x weekly - 3 sets - 15 seconds hold - Standing March with Counter Support  - 3 x daily - 7 x weekly - 3 sets - 10 reps - Heel Toe Raises with Counter Support  - 3 x daily - 7 x weekly - 3 sets - 10 reps - Gaze Stabilty (VOR) x1 Feet Together on Firm Ground With Horizontal Head Turns  - 3 x daily - 7 x weekly - 3 sets - 30 seconds hold  GOALS: Goals reviewed with patient? Yes  SHORT TERM  GOALS: Target date: 04/03/22  Patient will be independent with initial HEP and self-management strategies to improve functional outcomes Baseline:  Goal status: MET   LONG TERM GOALS: Target date: 04/24/22  Patient will be independent with advanced HEP and self-management strategies to improve functional outcomes Baseline:  Goal status: MET  2.  Patient will reduce Dizziness Handicap Inventory score by at least 4 points to indicate improvement in functional outcomes. (([place]). A four-point change in the DHI-S would be statistically significant at the p = 0.05 level) Baseline: 34 / 100 Goal status: MET   3.  Patient will reduce fall risk based on BERG balance score 51 or greater (in patients with hx of falls. Baseline: 41/56 Goal status: MET  4. 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 Baseline: Reports 80% back to baseline  Goal status: MET  5. Patient will be able to ambulate at least 325 feet during 2MWT with LRAD to demonstrate improved ability to perform functional mobility and associated tasks. Baseline: 294 feet no AD Goal status: MET  ASSESSMENT:  CLINICAL IMPRESSION: Pt has met all goals and states that she is ready for discharge.  Treatment today focused on balance and movement patterns; answering any questions on pt HEP.  OBJECTIVE IMPAIRMENTS Abnormal gait, decreased activity tolerance, decreased balance, decreased endurance, decreased knowledge of condition, decreased mobility, difficulty walking, and dizziness.   ACTIVITY LIMITATIONS carrying, standing, stairs, and locomotion level  PARTICIPATION LIMITATIONS: meal prep, cleaning, laundry, shopping, community activity, occupation, and yard work  PERSONAL FACTORS Age and Time since onset of injury/illness/exacerbation are also affecting patient's functional outcome.    REHAB POTENTIAL: Excellent  CLINICAL DECISION MAKING: Stable/uncomplicated  EVALUATION COMPLEXITY: Low  PLAN: PT FREQUENCY: 2x/week  PT DURATION: 8 weeks  PLANNED INTERVENTIONS: Therapeutic exercises, Therapeutic activity, Neuromuscular re-education, Balance training, Gait training, Patient/Family education, Self Care, Stair training, Vestibular training, Canalith repositioning, DME instructions, and Re-evaluation  PLAN FOR NEXT SESSION:  Discharge.   Rayetta Humphrey, [place] CLT [phone]

## 2022-06-09 ENCOUNTER — Encounter (INDEPENDENT_AMBULATORY_CARE_PROVIDER_SITE_OTHER): Payer: Medicare HMO | Admitting: Ophthalmology

## 2023-02-24 ENCOUNTER — Ambulatory Visit (HOSPITAL_COMMUNITY)
Admission: RE | Admit: 2023-02-24 | Discharge: 2023-02-24 | Disposition: A | Discharge status: Home / Self Care | Payer: Medicare HMO | Source: Ambulatory Visit | Attending: Hematology | Admitting: Hematology

## 2023-02-24 ENCOUNTER — Encounter (HOSPITAL_COMMUNITY): Payer: Self-pay

## 2023-02-24 ENCOUNTER — Inpatient Hospital Stay: Payer: Medicare HMO | Attending: Hematology

## 2023-02-24 DIAGNOSIS — Z170 Estrogen receptor positive status [ER+]: Secondary | ICD-10-CM

## 2023-02-24 DIAGNOSIS — Z853 Personal history of malignant neoplasm of breast: Secondary | ICD-10-CM | POA: Diagnosis present

## 2023-02-24 DIAGNOSIS — C50419 Malignant neoplasm of upper-outer quadrant of unspecified female breast: Secondary | ICD-10-CM | POA: Insufficient documentation

## 2023-02-24 LAB — COMPREHENSIVE METABOLIC PANEL
ALT: 19 U/L (ref 0–44)
AST: 18 U/L (ref 15–41)
Albumin: 4.1 g/dL (ref 3.5–5.0)
Alkaline Phosphatase: 72 U/L (ref 38–126)
Anion gap: 10 (ref 5–15)
BUN: 22 mg/dL (ref 8–23)
CO2: 27 mmol/L (ref 22–32)
Calcium: 8.8 mg/dL — ABNORMAL LOW (ref 8.9–10.3)
Chloride: 100 mmol/L (ref 98–111)
Creatinine, Ser: 0.84 mg/dL (ref 0.44–1.00)
GFR, Estimated: >60 mL/min (ref >60)
Glucose, Bld: 153 mg/dL — ABNORMAL HIGH (ref 70–99)
Potassium: 4.5 mmol/L (ref 3.5–5.1)
Sodium: 137 mmol/L (ref 135–145)
Total Bilirubin: 0.8 mg/dL (ref 0.3–1.2)
Total Protein: 6.8 g/dL (ref 6.5–8.1)

## 2023-02-24 LAB — CBC WITH DIFFERENTIAL/PLATELET
Abs Immature Granulocytes: 0.06 10*3/uL (ref 0.00–0.07)
Basophils Absolute: 0.1 10*3/uL (ref 0.0–0.1)
Basophils Relative: 1 %
Eosinophils Absolute: 0.2 10*3/uL (ref 0.0–0.5)
Eosinophils Relative: 2 %
HCT: 43.1 % (ref 36.0–46.0)
Hemoglobin: 14.2 g/dL (ref 12.0–15.0)
Immature Granulocytes: 1 %
Lymphocytes Relative: 18 %
Lymphs Abs: 1.5 10*3/uL (ref 0.7–4.0)
MCH: 31.5 pg (ref 26.0–34.0)
MCHC: 32.9 g/dL (ref 30.0–36.0)
MCV: 95.6 fL (ref 80.0–100.0)
Monocytes Absolute: 0.7 10*3/uL (ref 0.1–1.0)
Monocytes Relative: 8 %
Neutro Abs: 6 10*3/uL (ref 1.7–7.7)
Neutrophils Relative %: 70 %
Platelets: 244 10*3/uL (ref 150–400)
RBC: 4.51 MIL/uL (ref 3.87–5.11)
RDW: 12.6 % (ref 11.5–15.5)
WBC: 8.5 10*3/uL (ref 4.0–10.5)
nRBC: 0 % (ref 0.0–0.2)

## 2023-03-04 ENCOUNTER — Inpatient Hospital Stay (HOSPITAL_BASED_OUTPATIENT_CLINIC_OR_DEPARTMENT_OTHER): Payer: Medicare HMO | Admitting: Hematology

## 2023-03-04 ENCOUNTER — Encounter: Payer: Self-pay | Admitting: Hematology

## 2023-03-04 VITALS — BP 131/52 | HR 74 | Temp 97.8°F | Resp 18 | Wt 184.8 lb

## 2023-03-04 DIAGNOSIS — Z170 Estrogen receptor positive status [ER+]: Secondary | ICD-10-CM

## 2023-03-04 DIAGNOSIS — C50419 Malignant neoplasm of upper-outer quadrant of unspecified female breast: Secondary | ICD-10-CM

## 2023-03-04 DIAGNOSIS — Z853 Personal history of malignant neoplasm of breast: Secondary | ICD-10-CM | POA: Diagnosis not present

## 2023-03-04 NOTE — Progress Notes (Signed)
[facility] [address], [place] [address]   Patient Care Team: Carylon Perches, MD as PCP - General (Internal Medicine) West Bali, MD (Inactive) (Gastroenterology) Laqueta Linden, MD (Inactive) as Attending Physician (Cardiology) Doreatha Massed, MD as Medical Oncologist (Medical Oncology)  SUMMARY OF ONCOLOGIC HISTORY: Oncology History  Breast cancer [facility])   Initial Diagnosis   Breast cancer ([facility])   07/13/2017 Miscellaneous   BCI results revealed LOW risk of late recurrence (3%) and LOW likelihood of benefit from extended anti-estrogen therapy.      CHIEF COMPLIANT: Follow-up of breast cancer   INTERVAL HISTORY: Seen for follow-up of breast cancer.  Denies any new onset pains.  No recent infections or hospitalizations.  REVIEW OF SYSTEMS:   Review of Systems  Constitutional:  Negative for appetite change and fatigue.  Respiratory:  Positive for cough and shortness of breath.   Neurological:  Negative for headaches.  All other systems reviewed and are negative.   I have reviewed the past medical history, past surgical history, social history and family history with the patient and they are unchanged from previous note.   ALLERGIES:   is allergic to aspirin, iron, sulfa antibiotics, sulfasalazine, and ancef [cefazolin].   MEDICATIONS:  Current Outpatient Medications  Medication Sig Dispense Refill   acetaminophen (TYLENOL) 325 MG tablet Take 650 mg by mouth every 6 (six) hours as needed (for pain.).     amLODipine (NORVASC) 2.5 MG tablet Take 2.5 mg by mouth at bedtime.     B Complex-C (SUPER B COMPLEX PO) Take 1 capsule by mouth daily.      Calcium Carb-Cholecalciferol (CALCIUM 600/VITAMIN D3) 600-800 MG-UNIT TABS Take 1 capsule by mouth 2 (two) times daily.     cetirizine (ZYRTEC) 10 MG tablet Take 10 mg by mouth daily.     diclofenac sodium (VOLTAREN) 1 % GEL Apply 2-4 g topically 3 (three) times daily as needed (for pain.).       diphenhydrAMINE (BENADRYL) 25 mg capsule Take 25 mg by mouth at bedtime.     fluticasone (FLONASE) 50 MCG/ACT nasal spray Place 2 sprays into both nostrils daily. 16 g 0   Glucosamine-Chondroitin (COSAMIN DS PO) Take 1 tablet by mouth daily.     hydrocortisone 2.5 % lotion Apply topically daily. For 1 week 59 mL 0   loperamide (IMODIUM) 2 MG capsule Take 2 mg by mouth 4 (four) times daily as needed for diarrhea or loose stools.      meclizine (ANTIVERT) 25 MG tablet Take 25 mg by mouth 3 (three) times daily as needed.     Multiple Vitamin (MULTIVITAMIN WITH MINERALS) TABS tablet Take 1 tablet by mouth daily.     Multiple Vitamins-Minerals (PRESERVISION AREDS PO) Take 1 tablet by mouth daily.     pantoprazole (PROTONIX) 40 MG tablet Take 40 mg by mouth every morning.     Turmeric 500 MG CAPS Take 500 mg by mouth daily.     Zinc Sulfate (ZINC 15 PO) Take 1 tablet by mouth daily.     No current facility-administered medications for this visit.     PHYSICAL EXAMINATION: Performance status (ECOG): 1 - Symptomatic but completely ambulatory  Vitals:   03/04/23 1417  BP: (!) 131/52  Pulse: 74  Resp: 18  Temp: 97.8 F (36.6 C)  SpO2: 97%   Wt Readings from Last 3 Encounters:  03/04/23 184 lb 12.8 oz (83.8 kg)  03/18/22 179 lb 1.6 oz (81.2 kg)  02/20/22  170 lb (77.1 kg)   Physical Exam Vitals reviewed.  Constitutional:      Appearance: Normal appearance.  Cardiovascular:     Rate and Rhythm: Normal rate and regular rhythm.     Pulses: Normal pulses.     Heart sounds: Normal heart sounds.  Pulmonary:     Effort: Pulmonary effort is normal.     Breath sounds: Normal breath sounds.  Chest:  Breasts:    Right: Normal.     Left: No skin change (UOQ lumpectomy scar stable).  Abdominal:     Palpations: Abdomen is soft. There is no hepatomegaly, splenomegaly or mass.     Tenderness: There is no abdominal tenderness.  Lymphadenopathy:     Upper Body:     Right upper body: No  supraclavicular, axillary or pectoral adenopathy.     Left upper body: No supraclavicular, axillary or pectoral adenopathy.  Neurological:     General: No focal deficit present.     Mental Status: She is alert and oriented to person, place, and time.  Psychiatric:        Mood and Affect: Mood normal.        Behavior: Behavior normal.       LABORATORY DATA:  I have reviewed the data as listed    Latest Ref Rng & Units 02/24/2023   10:18 AM 02/20/2022    1:06 PM 02/18/2021   11:00 AM  CMP  Glucose 70 - 99 mg/dL 161  096  045   BUN 8 - 23 mg/dL 22  14  14    Creatinine 0.44 - 1.00 mg/dL 4.09  8.11  9.14   Sodium 135 - 145 mmol/L 137  138  137   Potassium 3.5 - 5.1 mmol/L 4.5  4.1  4.9   Chloride 98 - 111 mmol/L 100  106  102   CO2 22 - 32 mmol/L 27  25  29    Calcium 8.9 - 10.3 mg/dL 8.8  9.0  9.0   Total Protein 6.5 - 8.1 g/dL 6.8   6.7   Total Bilirubin 0.3 - 1.2 mg/dL 0.8   0.4   Alkaline Phos 38 - 126 U/L 72   94   AST 15 - 41 U/L 18   18   ALT 0 - 44 U/L 19   19    No results found for: "CAN153" Lab Results  Component Value Date   WBC 8.5 02/24/2023   HGB 14.2 02/24/2023   HCT 43.1 02/24/2023   MCV 95.6 02/24/2023   PLT 244 02/24/2023   NEUTROABS 6.0 02/24/2023    ASSESSMENT:  1.  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  Status post lumpectomy followed by adjuvant radiation. - Anastrozole started in July 2014, switched to tamoxifen in December 2016 completed in July 2019. - BCI testing in January 2019 with low likelihood of benefit with extended adjuvant therapy.  2.  Bone health: - Last DEXA scan on 12/12/2016 T score -1.   PLAN:  1.  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 - Reviewed mammogram from 02/24/2023: BI-RADS Category 2. - Labs  from 02/24/2023: Normal LFTs and CBC. - Recommend follow-up in 1 year with repeat mammogram.  2.  Bone health: - Continue calcium and vitamin D supplements.  Will check vitamin D  level at next visit.  Breast Cancer therapy associated bone loss: I have recommended calcium, Vitamin D and weight bearing exercises.  Orders placed this encounter:  No orders of the defined types were placed in this encounter.    The patient has a good understanding of the overall plan. She agrees with it. She will call with any problems that may develop before the next visit here.  Doreatha Massed, MD [facility] [phone]

## 2023-03-04 NOTE — Patient Instructions (Addendum)
[facility]  Discharge Instructions  You were seen and examined today by Dr. Ellin Saba.  Dr. Ellin Saba discussed your most recent lab work and mammogram which revealed that everything looks good and stable.  Follow-up as scheduled in 1 year.    Thank you for choosing [facility] to provide your oncology and hematology care.   To afford each patient quality time with our provider, please arrive at least 15 minutes before your scheduled appointment time. You may need to reschedule your appointment if you arrive late (10 or more minutes). Arriving late affects you and other patients whose appointments are after yours.  Also, if you miss three or more appointments without notifying the office, you may be dismissed from the clinic at the provider's discretion.    Again, thank you for choosing [facility].  Our hope is that these requests will decrease the amount of time that you wait before being seen by our physicians.   If you have a lab appointment with the Cancer Center - please note that after April 8th, all labs will be drawn in the cancer center.  You do not have to check in or register with the main entrance as you have in the past but will complete your check-in at the cancer center.            _____________________________________________________________  Should you have questions after your visit to [facility], please contact our office at [phone] and follow the prompts.  Our office hours are 8:00 a.m. to 4:30 p.m. Monday - Thursday and 8:00 a.m. to 2:30 p.m. Friday.  Please note that voicemails left after 4:00 p.m. may not be returned until the following business day.  We are closed weekends and all major holidays.  You do have access to a nurse 24-7, just call the main number to the clinic [phone] and do not press any options, hold on the line and a nurse will answer the phone.    For  prescription refill requests, have your pharmacy contact our office and allow 72 hours.    Masks are no longer required in the cancer centers. If you would like for your care team to wear a mask while they are taking care of you, please let them know. You may have one support person who is at least 82 years old accompany you for your appointments.

## 2023-10-22 NOTE — Therapy (Signed)
 OUTPATIENT PHYSICAL THERAPY THORACOLUMBAR EVALUATION   Patient Name: Kayla Thomas MRN: [id number] DOB:July 15, 1940, 83 y.o., female Today's Date: 10/23/2023  END OF SESSION:  PT End of Session - 10/23/23 0852     Visit Number 1    Number of Visits 12    Date for PT Re-Evaluation 12/04/23    Authorization Type [employer]    Authorization Time Period No auth    PT Start Time 0855    PT Stop Time 0929    PT Time Calculation (min) 34 min    Activity Tolerance Patient tolerated treatment well    Behavior During Therapy [facility] for tasks assessed/performed             Past Medical History:  Diagnosis Date   Breast cancer (HCC)    Breast cancer (HCC)    Chronic back pain    Gastric ulcer    History   GERD (gastroesophageal reflux disease) 01/21/2006   EGD Dr Nolene Baumgarten mild chronic gastritis, (NO h pylori) otherwise normal   Headache(784.0)    Sinus congestion    Thumb tendonitis September 2014   left   Uterine cancer (HCC) 1998   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   Status post vitrectomy March 2021 revealed resolved VM traction   Wears partial dentures    Past Surgical History:  Procedure Laterality Date   ABDOMINAL HYSTERECTOMY     ABSCESS DRAINAGE  03-04-05   insect bite   CARDIAC CATHETERIZATION N/A 10/31/2014   Procedure: Right/Left Heart Cath and Coronary Angiography;  Surgeon: Millicent Ally, MD;  Location: [facility];  Service: Cardiovascular;  Laterality: N/A;   CATARACT EXTRACTION W/PHACO  10/06/2011   Procedure: CATARACT EXTRACTION PHACO AND INTRAOCULAR LENS PLACEMENT (IOC);  Surgeon: Clay Cummins, MD;  Location: [facility];  Service: Ophthalmology;  Laterality: Left;  CDE:  5.76   cataracts     CHOLECYSTECTOMY  11/1999   COLONOSCOPY  05/13/2011   Procedure: COLONOSCOPY;  Surgeon: Pauleen Borne, MD;  Location: [facility];  Service: Endoscopy;  Laterality: N/A;  8:30   COLONOSCOPY N/A 04/09/2017   Dr. Nolene Baumgarten: single tubular adenoma  removed from the colon, redundant left colon, internal and external hemorrhoids, next colonoscopy in 10 to 15 years.   ESOPHAGOGASTRODUODENOSCOPY  01/21/06   mild antral erythema bx h-pylori/normal esophagus without evidence of mass or Barrett's/normal pylorus and duodenum   PARTIAL MASTECTOMY WITH NEEDLE LOCALIZATION AND AXILLARY SENTINEL LYMPH NODE BX Left 08/18/2012   Procedure: PARTIAL MASTECTOMY WITH NEEDLE LOCALIZATION AND AXILLARY SENTINEL LYMPH NODE BX;  Surgeon: Beau Bound, MD;  Location: [facility];  Service: General;  Laterality: Left;  Need Frozen Section/Sentinel Node Bx @ 8:00am/Needle Loc @ 9:00am   POLYPECTOMY  04/09/2017   Procedure: POLYPECTOMY;  Surgeon: Alyce Jubilee, MD;  Location: [facility];  Service: Endoscopy;;  Rectal   S/P Hysterectomy  1998   uterine ca   temperal/mandibular  1974   TONSILLECTOMY  1964   TRIGGER FINGER RELEASE Right 05/04/2014   Procedure: RELEASE TRIGGER FINGER/A-1 PULLEY RIGHT RING FINGER;  Surgeon: Lyanne Sample, MD;  Location: [facility];  Service: Orthopedics;  Laterality: Right;   Patient Active Problem List   Diagnosis Date Noted   History of vitrectomy 12/05/2021   Intermediate stage nonexudative age-related macular degeneration of left eye 05/21/2021   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   Dry eyes, bilateral 05/03/2020   Follow-up examination after eye surgery 10/19/2019  Advanced nonexudative age-related macular degeneration of right eye with subfoveal involvement 10/19/2019   Polyp of rectum    Vulval lesion benign, excised 12/22/2016   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   Closed nondisplaced fracture of distal phalanx of right middle finger 02/27/2016   Breast cancer (HCC) 04/30/2015   Abnormal ECG    DOE (dyspnea on exertion) 04/12/2014   Migraine headache 01/23/2009   GERD 01/23/2009   Gastritis and gastroduodenitis 01/23/2009   LOOSE STOOLS 01/23/2009   Allergy 01/23/2009    UTERINE CANCER, HX OF 01/23/2009    PCP: Artemisa Bile, MD  REFERRING PROVIDER: Artemisa Bile, MD  REFERRING DIAG: LBP  Rationale for Evaluation and Treatment: Rehabilitation  THERAPY DIAG:  Other low back pain  Decreased range of motion of lumbar spine  Impaired functional mobility, balance, gait, and endurance  ONSET DATE: 6 weeks   SUBJECTIVE:                                                                                                                                                                                           SUBJECTIVE STATEMENT: Patient reports her back pain is at a 4 right now. Reports pain started insidiously. Reports shoulder started hurting recently, about a week ago. Reports back pain is more on the R side, feel like a grabbing/pulling pain. Increases when stepping with RLE, when standing and when walking. Reports being real careful when doing stuff. Went to MD a month ago and it wasn't bad during that appointment but is getting worse. Tried injections about 10 years ago and that helped for a long time.  PERTINENT HISTORY:  Back surgery 22 years ago Arthritis  PAIN:  Are you having pain? Yes: NPRS scale: 4 Pain location: Low back, R side Pain description: Grabbing/Pulling Aggravating factors: Transfers, walking, Moving/weight bearing RLE Relieving factors: Sitting, Muscle relaxer, ibuprofen, Voltaren, aquatic therapy  PRECAUTIONS: None  RED FLAGS: None   WEIGHT BEARING RESTRICTIONS: No  FALLS:  Has patient fallen in last 6 months? No  OCCUPATION: Retired, Sales executive  PLOF: Independent, Driving  PATIENT GOALS: "Get rid of pain, walk and garden"   NEXT MD VISIT: Next month   OBJECTIVE:  Note: Objective measures were completed at Evaluation unless otherwise noted.  DIAGNOSTIC FINDINGS:    PATIENT SURVEYS:  Modified Oswestry Modified Oswestry Low Back Pain Disability Questionnaire: 21 / 50 = 42.0 %   COGNITION: Overall cognitive  status: Within functional limits for tasks assessed     SENSATION: WFL    POSTURE: rounded shoulders, forward head, decreased lumbar lordosis, and increased thoracic kyphosis  PALPATION: TTP in R gluteal  region and R side lumbar region Tightness noted in R gluteal region as compared to L and bilat paraspinals  LUMBAR ROM:   AROM eval  Flexion To knees, 50%, painful  Extension 25%  Right lateral flexion 25%, above knee  Left lateral flexion 25%, above knee, painful  Right rotation [facility] but painful  Left rotation WFL   (Blank rows = not tested)  LOWER EXTREMITY ROM:     Active  Right eval Left eval  Hip flexion    Hip extension    Hip abduction    Hip adduction    Hip internal rotation    Hip external rotation    Knee flexion    Knee extension    Ankle dorsiflexion    Ankle plantarflexion    Ankle inversion    Ankle eversion     (Blank rows = not tested)  LOWER EXTREMITY MMT:    MMT Right eval Left eval  Hip flexion 3+ 4-  Hip extension    Hip abduction    Hip adduction    Hip internal rotation    Hip external rotation    Knee flexion 4+ 4+  Knee extension 4+ 4+  Ankle dorsiflexion 5 5  Ankle plantarflexion    Ankle inversion    Ankle eversion     (Blank rows = not tested)  LUMBAR SPECIAL TESTS:    FUNCTIONAL TESTS:  30 seconds chair stand test 2 minute walk test: 195', reports pain with first few steps that remains t/o test, has to take one standing rest break 30 sec STS: 6.5 STS   GAIT: Distance walked: 195' Assistive device utilized: None Level of assistance: Complete Independence Comments: Dec velocity, forward lean, lateral lean to R, dec stance on RLE, circumduction with RLE, knees flexed t/o, reporting pain in low back and R hip region  TREATMENT DATE:  10/23/23: PT Eval and HEP                                                                                                                                PATIENT EDUCATION:  Education  details: PT evaluation, objective findings, POC, Importance of HEP, Precautions, Clinic policies Person educated: Patient Education method: Explanation and Demonstration Education comprehension: verbalized understanding and returned demonstration  HOME EXERCISE PROGRAM: Access Code: [email or web address] URL: [email or web address] Date: 10/23/2023 Prepared by: Virgia Griffins [facility]  Exercises - Seated Hamstring Stretch  - 2 x daily - 7 x weekly - 3 sets - 30 hold - Supine Lower Trunk Rotation  - 2 x daily - 7 x weekly - 2 sets - 10 reps - Supine Piriformis Stretch with Foot on Ground  - 2 x daily - 7 x weekly - 3 sets - 30 hold - Hooklying Single Knee to Chest Stretch  - 2 x daily - 7 x weekly - 2 sets - 10 reps  ASSESSMENT:  CLINICAL IMPRESSION: Patient is a 83 y.o. female who was seen today  for physical therapy evaluation and treatment for LBP. Patient demonstrated decreased LE strength, limited lumbar ROM, poor posture, and tight paraspinal and gluteal musculature which may be contributing to her increased pain, altered gait, and overall decrease in function. Patient will benefit from skilled physical therapy in order to address the above to return to PLOF, improve function and QOL.    OBJECTIVE IMPAIRMENTS: Abnormal gait, decreased activity tolerance, decreased balance, decreased coordination, decreased endurance, decreased mobility, difficulty walking, decreased strength, improper body mechanics, postural dysfunction, and pain.   ACTIVITY LIMITATIONS: bending, standing, squatting, stairs, and transfers  PARTICIPATION LIMITATIONS: cleaning, laundry, community activity, and yard work  [facility] POTENTIAL: Good  CLINICAL DECISION MAKING: Stable/uncomplicated  EVALUATION COMPLEXITY: Low   GOALS: Goals reviewed with patient? No  SHORT TERM GOALS: Target date: 11/06/23  Patient will be independent with performance of HEP to demonstrate adequate self management of symptoms.   Baseline:  Goal status: INITIAL  2.   Patient will report at least a 25% improvement with function or pain overall since beginning PT. Baseline:  Goal status: INITIAL   LONG TERM GOALS: Target date: 12/04/23  Patient will improve Oswestry score by   10  points in order to improve self-perceived disability and overall function.  Baseline: Goal status: INITIAL  2.  Patient will improve  30 sec STS  score by at least 2 more STS  in order to demonstrate improved LE strength/endurance needed for community ambulation. Baseline: Goal status: INITIAL 3.  Patient will improve   score by at least 50 feet and with decreased pain in order to demonstrate improved endurance needed for community ambulation. Baseline: Goal status: INITIAL  4.  Patient will gain at least 25% more ROM in all directions of lumbar AROM in order to perform iADLs safely.  Baseline:  Goal status: INITIAL  5.  Patient will report overall 50% improvement since beginning PT. Baseline:  Goal status: INITIAL   PLAN:  PT FREQUENCY: 1-2x/week  PT DURATION: 6 weeks  PLANNED INTERVENTIONS: 97164- PT Re-evaluation, 97110-Therapeutic exercises, 97530- Therapeutic activity, [id number]- Neuromuscular re-education, 97535- Self Care, [id number]- Manual therapy, [id number]- Gait training, [phone]- Traction (mechanical), Patient/Family education, Balance training, Stair training, Dry Needling, Spinal mobilization, and Moist heat.  PLAN FOR NEXT SESSION: Intro Postural exercises, Progress lumbar mobility, intro LE strengthening   12:52 PM, 10/23/23 Trystyn Sitts [facility], PT, DPT [facility] [place]

## 2023-10-23 ENCOUNTER — Encounter (HOSPITAL_COMMUNITY): Payer: Self-pay

## 2023-10-23 ENCOUNTER — Ambulatory Visit (HOSPITAL_COMMUNITY): Attending: Internal Medicine

## 2023-10-23 ENCOUNTER — Other Ambulatory Visit: Payer: Self-pay

## 2023-10-23 DIAGNOSIS — Z7409 Other reduced mobility: Secondary | ICD-10-CM | POA: Insufficient documentation

## 2023-10-23 DIAGNOSIS — M5386 Other specified dorsopathies, lumbar region: Secondary | ICD-10-CM | POA: Insufficient documentation

## 2023-10-23 DIAGNOSIS — M5459 Other low back pain: Secondary | ICD-10-CM | POA: Insufficient documentation

## 2023-10-28 ENCOUNTER — Encounter (HOSPITAL_COMMUNITY): Payer: Self-pay

## 2023-10-28 ENCOUNTER — Ambulatory Visit (HOSPITAL_COMMUNITY)

## 2023-10-28 DIAGNOSIS — M5459 Other low back pain: Secondary | ICD-10-CM | POA: Diagnosis not present

## 2023-10-28 DIAGNOSIS — M5386 Other specified dorsopathies, lumbar region: Secondary | ICD-10-CM

## 2023-10-28 DIAGNOSIS — Z7409 Other reduced mobility: Secondary | ICD-10-CM

## 2023-10-28 NOTE — Therapy (Addendum)
 OUTPATIENT PHYSICAL THERAPY THORACOLUMBAR TREATMENT   Patient Name: Kayla Thomas MRN: [id number] DOB:January 12, 1941, 83 y.o., female Today's Date: 10/28/2023  END OF SESSION:  PT End of Session - 10/28/23 1145     Visit Number 2    Number of Visits 12    Date for PT Re-Evaluation 12/04/23    Authorization Type [employer]    Authorization Time Period No auth    PT Start Time 1145    PT Stop Time 1225    PT Time Calculation (min) 40 min    Activity Tolerance Patient tolerated treatment well    Behavior During Therapy [facility] for tasks assessed/performed             Past Medical History:  Diagnosis Date   Breast cancer (HCC)    Breast cancer (HCC)    Chronic back pain    Gastric ulcer    History   GERD (gastroesophageal reflux disease) 01/21/2006   EGD Dr Nolene Baumgarten mild chronic gastritis, (NO h pylori) otherwise normal   Headache(784.0)    Sinus congestion    Thumb tendonitis September 2014   left   Uterine cancer (HCC) 1998   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   Status post vitrectomy March 2021 revealed resolved VM traction   Wears partial dentures    Past Surgical History:  Procedure Laterality Date   ABDOMINAL HYSTERECTOMY     ABSCESS DRAINAGE  03-04-05   insect bite   CARDIAC CATHETERIZATION N/A 10/31/2014   Procedure: Right/Left Heart Cath and Coronary Angiography;  Surgeon: Millicent Ally, MD;  Location: [facility];  Service: Cardiovascular;  Laterality: N/A;   CATARACT EXTRACTION W/PHACO  10/06/2011   Procedure: CATARACT EXTRACTION PHACO AND INTRAOCULAR LENS PLACEMENT (IOC);  Surgeon: Clay Cummins, MD;  Location: [facility];  Service: Ophthalmology;  Laterality: Left;  CDE:  5.76   cataracts     CHOLECYSTECTOMY  11/1999   COLONOSCOPY  05/13/2011   Procedure: COLONOSCOPY;  Surgeon: Pauleen Borne, MD;  Location: [facility];  Service: Endoscopy;  Laterality: N/A;  8:30   COLONOSCOPY N/A 04/09/2017   Dr. Nolene Baumgarten: single tubular adenoma  removed from the colon, redundant left colon, internal and external hemorrhoids, next colonoscopy in 10 to 15 years.   ESOPHAGOGASTRODUODENOSCOPY  01/21/06   mild antral erythema bx h-pylori/normal esophagus without evidence of mass or Barrett's/normal pylorus and duodenum   PARTIAL MASTECTOMY WITH NEEDLE LOCALIZATION AND AXILLARY SENTINEL LYMPH NODE BX Left 08/18/2012   Procedure: PARTIAL MASTECTOMY WITH NEEDLE LOCALIZATION AND AXILLARY SENTINEL LYMPH NODE BX;  Surgeon: Beau Bound, MD;  Location: [facility];  Service: General;  Laterality: Left;  Need Frozen Section/Sentinel Node Bx @ 8:00am/Needle Loc @ 9:00am   POLYPECTOMY  04/09/2017   Procedure: POLYPECTOMY;  Surgeon: Alyce Jubilee, MD;  Location: [facility];  Service: Endoscopy;;  Rectal   S/P Hysterectomy  1998   uterine ca   temperal/mandibular  1974   TONSILLECTOMY  1964   TRIGGER FINGER RELEASE Right 05/04/2014   Procedure: RELEASE TRIGGER FINGER/A-1 PULLEY RIGHT RING FINGER;  Surgeon: Lyanne Sample, MD;  Location: [facility];  Service: Orthopedics;  Laterality: Right;   Patient Active Problem List   Diagnosis Date Noted   History of vitrectomy 12/05/2021   Intermediate stage nonexudative age-related macular degeneration of left eye 05/21/2021   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   Dry eyes, bilateral 05/03/2020   Follow-up examination after eye surgery 10/19/2019  Advanced nonexudative age-related macular degeneration of right eye with subfoveal involvement 10/19/2019   Polyp of rectum    Vulval lesion benign, excised 12/22/2016   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   Closed nondisplaced fracture of distal phalanx of right middle finger 02/27/2016   Breast cancer (HCC) 04/30/2015   Abnormal ECG    DOE (dyspnea on exertion) 04/12/2014   Migraine headache 01/23/2009   GERD 01/23/2009   Gastritis and gastroduodenitis 01/23/2009   LOOSE STOOLS 01/23/2009   Allergy 01/23/2009    UTERINE CANCER, HX OF 01/23/2009    PCP: Artemisa Bile, MD  REFERRING PROVIDER: Artemisa Bile, MD  REFERRING DIAG: LBP  Rationale for Evaluation and Treatment: Rehabilitation  THERAPY DIAG:  Other low back pain  Decreased range of motion of lumbar spine  Impaired functional mobility, balance, gait, and endurance  ONSET DATE: 6 weeks   SUBJECTIVE:                                                                                                                                                                                           SUBJECTIVE STATEMENT: Reports pain as 5/10 today. Reports sometimes pain has been too hard to do HEP. Reports pain is getting worse. Pain intensity is stronger and can't walk without inc pain now. Anything that bring knee to chest, kicking RLE forward and to the side are all painful. [facility] during her water  aerobic class more than usual. Reports her MD mentioned possible imaging before. Decided to try PT but thinking about going in for imaging. Encouraged to try one more week of new HEP and reassess need after next session if no improvement.    Patient reports her back pain is at a 4 right now. Reports pain started insidiously. Reports shoulder started hurting recently, about a week ago. Reports back pain is more on the R side, feel like a grabbing/pulling pain. Increases when stepping with RLE, when standing and when walking. Reports being real careful when doing stuff. Went to MD a month ago and it wasn't bad during that appointment but is getting worse. Tried injections about 10 years ago and that helped for a long time.  PERTINENT HISTORY:  Back surgery 22 years ago Arthritis  PAIN:  Are you having pain? Yes: NPRS scale: 4 Pain location: Low back, R side Pain description: Grabbing/Pulling Aggravating factors: Transfers, walking, Moving/weight bearing RLE Relieving factors: Sitting, Muscle relaxer, ibuprofen, Voltaren, aquatic  therapy  PRECAUTIONS: None  RED FLAGS: None   WEIGHT BEARING RESTRICTIONS: No  FALLS:  Has patient fallen in last 6 months? No  OCCUPATION: Retired, Sales executive  PLOF: Independent, Driving  PATIENT GOALS: "Get rid of pain, walk and garden"   NEXT MD VISIT: Next month   OBJECTIVE:  Note: Objective measures were completed at Evaluation unless otherwise noted.  DIAGNOSTIC FINDINGS:    PATIENT SURVEYS:  Modified Oswestry Modified Oswestry Low Back Pain Disability Questionnaire: 21 / 50 = 42.0 %   COGNITION: Overall cognitive status: Within functional limits for tasks assessed     SENSATION: WFL    POSTURE: rounded shoulders, forward head, decreased lumbar lordosis, and increased thoracic kyphosis  PALPATION: TTP in R gluteal region and R side lumbar region Tightness noted in R gluteal region as compared to L and bilat paraspinals  LUMBAR ROM:   AROM eval  Flexion To knees, 50% avail, painful  Extension 25% avail  Right lateral flexion 25% avail, above knee  Left lateral flexion 25% avail, above knee, painful  Right rotation [facility] but painful  Left rotation WFL   (Blank rows = not tested)  LOWER EXTREMITY ROM:     Active  Right eval Left eval  Hip flexion    Hip extension    Hip abduction    Hip adduction    Hip internal rotation    Hip external rotation    Knee flexion    Knee extension    Ankle dorsiflexion    Ankle plantarflexion    Ankle inversion    Ankle eversion     (Blank rows = not tested)  LOWER EXTREMITY MMT:    MMT Right eval Left eval  Hip flexion 3+ 4-  Hip extension    Hip abduction    Hip adduction    Hip internal rotation    Hip external rotation    Knee flexion 4+ 4+  Knee extension 4+ 4+  Ankle dorsiflexion 5 5  Ankle plantarflexion    Ankle inversion    Ankle eversion     (Blank rows = not tested)  LUMBAR SPECIAL TESTS:    FUNCTIONAL TESTS:  30 seconds chair stand test 2 minute walk test: 195', reports  pain with first few steps that remains t/o test, has to take one standing rest break 30 sec STS: 6.5 STS   GAIT: Distance walked: 195' Assistive device utilized: None Level of assistance: Complete Independence Comments: Dec velocity, forward lean, lateral lean to R, dec stance on LLE, circumduction with RLE at times, knees flexed t/o, NBOS, reporting pain in low back and R hip region  TREATMENT DATE:  10/28/23: Review goals and HEP PPT, 2x10 LTR, 2x10 Supine TA contraction, 5" holds, 7x, reports inc pain Supine, ball roll-ins, feet on Physioball, LE to 90 deg bend, 2x10 Clamshells, painful to do with LLE when laying on R side, 10x w/ RLE only Standing lumbar ext against wall, 10x Seated scapular retraction, 5" holds, 5x  10/23/23: PT Eval and HEP  PATIENT EDUCATION:  Education details: PT evaluation, objective findings, POC, Importance of HEP, Precautions, Clinic policies Person educated: Patient Education method: Explanation and Demonstration Education comprehension: verbalized understanding and returned demonstration  HOME EXERCISE PROGRAM: Access Code: [facility] URL: [email or web address] Date: 10/23/2023 Prepared by: Virgia Griffins Powell-Butler  Exercises - Seated Hamstring Stretch  - 2 x daily - 7 x weekly - 3 sets - 30 hold - Supine Lower Trunk Rotation  - 2 x daily - 7 x weekly - 2 sets - 10 reps - Supine Piriformis Stretch with Foot on Ground  - 2 x daily - 7 x weekly - 3 sets - 30 hold - Hooklying Single Knee to Chest Stretch  - 2 x daily - 7 x weekly - 2 sets - 10 reps   Exercises - Standing Lumbar Extension at Wall - Forearms  - 2 x daily - 7 x weekly - 10 reps - 5 hold - Seated Scapular Retraction  - 2 x daily - 7 x weekly - 10 reps - 5 hold  ASSESSMENT:  CLINICAL IMPRESSION: Patient reports to PT with reports of increased daily  pain since last visit. Reports he hasn't been able to perform all of the HEP because she cannot tolerate SKTC or piriformis stretch without increased pain. Removed these from current HEP. During review of HEP, pt reports stretching feeling on R side of low back during LTR but is tolerable. Patient cannot tolerate clamshells when lying on her R side and performing exercise with LLE but can tolerate on opposite side. TA contraction is painful to low back when performing contraction at max performance, more tolerable when cued to contract with 50% effort. Addition of standing lumbar ext against wall and scapular retraction to HEP, pt tolerating well in session. Patient will benefit from continued skilled physical therapy in order to address her pain, decreased ROM, dec strength, altered gait, and dec activity tolerance in order to return to PLOF and improve QOL.    Patient is a 83 y.o. female who was seen today for physical therapy evaluation and treatment for LBP. Patient demonstrated decreased LE strength, limited lumbar ROM, poor posture, and tight paraspinal and gluteal musculature which may be contributing to her increased pain, altered gait, and overall decrease in function. Patient will benefit from skilled physical therapy in order to address the above to return to PLOF, improve function and QOL.    OBJECTIVE IMPAIRMENTS: Abnormal gait, decreased activity tolerance, decreased balance, decreased coordination, decreased endurance, decreased mobility, difficulty walking, decreased strength, improper body mechanics, postural dysfunction, and pain.   ACTIVITY LIMITATIONS: bending, standing, squatting, stairs, and transfers  PARTICIPATION LIMITATIONS: cleaning, laundry, community activity, and yard work  [facility] POTENTIAL: Good  CLINICAL DECISION MAKING: Stable/uncomplicated  EVALUATION COMPLEXITY: Low   GOALS: Goals reviewed with patient? Yes  SHORT TERM GOALS: Target date: 11/06/23  Patient  will be independent with performance of HEP to demonstrate adequate self management of symptoms.  Baseline:  Goal status: INITIAL  2.   Patient will report at least a 25% improvement with function or pain overall since beginning PT. Baseline:  Goal status: INITIAL   LONG TERM GOALS: Target date: 12/04/23  Patient will improve Oswestry score by   10  points in order to improve self-perceived disability and overall function.  Baseline: Goal status: INITIAL  2.  Patient will improve  30 sec STS  score by at least 2 more STS  in order to demonstrate improved LE strength/endurance needed for community ambulation. Baseline: Goal status:  INITIAL 3.  Patient will improve   score by at least 50 feet and with decreased pain in order to demonstrate improved endurance needed for community ambulation. Baseline: Goal status: INITIAL  4.  Patient will gain at least 25% more ROM in all directions of lumbar AROM in order to perform iADLs safely.  Baseline:  Goal status: INITIAL  5.  Patient will report overall 50% improvement since beginning PT. Baseline:  Goal status: INITIAL   PLAN:  PT FREQUENCY: 1-2x/week  PT DURATION: 6 weeks  PLANNED INTERVENTIONS: 97164- PT Re-evaluation, 97110-Therapeutic exercises, 97530- Therapeutic activity, [id number]- Neuromuscular re-education, 97535- Self Care, [id number]- Manual therapy, [id number]- Gait training, [phone]- Traction (mechanical), Patient/Family education, Balance training, Stair training, Dry Needling, Spinal mobilization, and Moist heat.  PLAN FOR NEXT SESSION: Cont Postural exercises, Progress lumbar mobility, intro LE strengthening   12:39 PM, 10/28/23 Rosmery Duggin Powell-Butler, PT, DPT [facility] [place]

## 2023-11-02 ENCOUNTER — Encounter (HOSPITAL_COMMUNITY)

## 2023-11-02 ENCOUNTER — Observation Stay (HOSPITAL_COMMUNITY)
Admission: EM | Admit: 2023-11-02 | Discharge: 2023-11-04 | Disposition: A | Discharge status: Home Health | Attending: Emergency Medicine | Admitting: Emergency Medicine

## 2023-11-02 ENCOUNTER — Other Ambulatory Visit: Payer: Self-pay

## 2023-11-02 ENCOUNTER — Emergency Department (HOSPITAL_COMMUNITY)

## 2023-11-02 ENCOUNTER — Encounter (HOSPITAL_COMMUNITY): Payer: Self-pay

## 2023-11-02 DIAGNOSIS — R1011 Right upper quadrant pain: Secondary | ICD-10-CM | POA: Diagnosis not present

## 2023-11-02 DIAGNOSIS — M545 Low back pain, unspecified: Secondary | ICD-10-CM | POA: Diagnosis present

## 2023-11-02 DIAGNOSIS — I10 Essential (primary) hypertension: Secondary | ICD-10-CM | POA: Diagnosis present

## 2023-11-02 DIAGNOSIS — R2689 Other abnormalities of gait and mobility: Secondary | ICD-10-CM | POA: Insufficient documentation

## 2023-11-02 DIAGNOSIS — R2681 Unsteadiness on feet: Secondary | ICD-10-CM | POA: Insufficient documentation

## 2023-11-02 DIAGNOSIS — Z8542 Personal history of malignant neoplasm of other parts of uterus: Secondary | ICD-10-CM | POA: Insufficient documentation

## 2023-11-02 DIAGNOSIS — Z853 Personal history of malignant neoplasm of breast: Secondary | ICD-10-CM | POA: Diagnosis not present

## 2023-11-02 DIAGNOSIS — M6281 Muscle weakness (generalized): Secondary | ICD-10-CM | POA: Insufficient documentation

## 2023-11-02 DIAGNOSIS — M549 Dorsalgia, unspecified: Secondary | ICD-10-CM | POA: Diagnosis present

## 2023-11-02 DIAGNOSIS — Z79899 Other long term (current) drug therapy: Secondary | ICD-10-CM | POA: Diagnosis not present

## 2023-11-02 DIAGNOSIS — M5459 Other low back pain: Principal | ICD-10-CM

## 2023-11-02 LAB — CBC WITH DIFFERENTIAL/PLATELET
Abs Immature Granulocytes: 0.07 10*3/uL (ref 0.00–0.07)
Basophils Absolute: 0.1 10*3/uL (ref 0.0–0.1)
Basophils Relative: 1 %
Eosinophils Absolute: 0.1 10*3/uL (ref 0.0–0.5)
Eosinophils Relative: 1 %
HCT: 41.4 % (ref 36.0–46.0)
Hemoglobin: 13.9 g/dL (ref 12.0–15.0)
Immature Granulocytes: 1 %
Lymphocytes Relative: 12 %
Lymphs Abs: 1.2 10*3/uL (ref 0.7–4.0)
MCH: 31.4 pg (ref 26.0–34.0)
MCHC: 33.6 g/dL (ref 30.0–36.0)
MCV: 93.7 fL (ref 80.0–100.0)
Monocytes Absolute: 0.6 10*3/uL (ref 0.1–1.0)
Monocytes Relative: 6 %
Neutro Abs: 8.7 10*3/uL — ABNORMAL HIGH (ref 1.7–7.7)
Neutrophils Relative %: 79 %
Platelets: 239 10*3/uL (ref 150–400)
RBC: 4.42 MIL/uL (ref 3.87–5.11)
RDW: 12.4 % (ref 11.5–15.5)
WBC: 10.7 10*3/uL — ABNORMAL HIGH (ref 4.0–10.5)
nRBC: 0 % (ref 0.0–0.2)

## 2023-11-02 LAB — URINALYSIS, ROUTINE W REFLEX MICROSCOPIC
Bilirubin Urine: NEGATIVE
Glucose, UA: NEGATIVE mg/dL
Hgb urine dipstick: NEGATIVE
Ketones, ur: NEGATIVE mg/dL
Leukocytes,Ua: NEGATIVE
Nitrite: NEGATIVE
Protein, ur: NEGATIVE mg/dL
Specific Gravity, Urine: 1.016 (ref 1.005–1.030)
pH: 6 (ref 5.0–8.0)

## 2023-11-02 LAB — BASIC METABOLIC PANEL WITH GFR
Anion gap: 9 (ref 5–15)
BUN: 20 mg/dL (ref 8–23)
CO2: 25 mmol/L (ref 22–32)
Calcium: 8.9 mg/dL (ref 8.9–10.3)
Chloride: 100 mmol/L (ref 98–111)
Creatinine, Ser: 0.73 mg/dL (ref 0.44–1.00)
GFR, Estimated: >60 mL/min (ref >60)
Glucose, Bld: 131 mg/dL — ABNORMAL HIGH (ref 70–99)
Potassium: 4.6 mmol/L (ref 3.5–5.1)
Sodium: 134 mmol/L — ABNORMAL LOW (ref 135–145)

## 2023-11-02 MED ORDER — ACETAMINOPHEN 325 MG PO TABS: 650.0000 mg | ORAL_TABLET | Freq: Four times a day (QID) | ORAL | Status: DC | PRN

## 2023-11-02 MED ORDER — ONDANSETRON HCL 4 MG/2ML IJ SOLN: 4.0000 mg | Freq: Four times a day (QID) | INTRAMUSCULAR | Status: DC | PRN

## 2023-11-02 MED ORDER — PANTOPRAZOLE SODIUM 40 MG PO TBEC
40.0000 mg | DELAYED_RELEASE_TABLET | Freq: Every morning | ORAL | Status: DC
  Administered 2023-11-03: 40 mg via ORAL
  Filled 2023-11-02: qty 1

## 2023-11-02 MED ORDER — ONDANSETRON HCL 4 MG PO TABS: 4.0000 mg | ORAL_TABLET | Freq: Four times a day (QID) | ORAL | Status: DC | PRN

## 2023-11-02 MED ORDER — METHYLPREDNISOLONE SODIUM SUCC 125 MG IJ SOLR
125.0000 mg | Freq: Once | INTRAMUSCULAR | Status: AC
  Administered 2023-11-02: 125 mg via INTRAVENOUS
  Filled 2023-11-02: qty 2

## 2023-11-02 MED ORDER — LOPERAMIDE HCL 2 MG PO CAPS: 2.0000 mg | ORAL_CAPSULE | Freq: Four times a day (QID) | ORAL | Status: DC | PRN

## 2023-11-02 MED ORDER — GABAPENTIN 100 MG PO CAPS
100.0000 mg | ORAL_CAPSULE | Freq: Once | ORAL | Status: AC
  Administered 2023-11-02: 100 mg via ORAL
  Filled 2023-11-02: qty 1

## 2023-11-02 MED ORDER — ACETAMINOPHEN 650 MG RE SUPP: 650.0000 mg | Freq: Four times a day (QID) | RECTAL | Status: DC | PRN

## 2023-11-02 MED ORDER — AMLODIPINE BESYLATE 5 MG PO TABS
2.5000 mg | ORAL_TABLET | Freq: Every day | ORAL | Status: DC
  Administered 2023-11-02 – 2023-11-03 (×2): 2.5 mg via ORAL
  Filled 2023-11-02 (×2): qty 1

## 2023-11-02 MED ORDER — IBUPROFEN 400 MG PO TABS: 600.0000 mg | ORAL_TABLET | Freq: Once | ORAL | Status: DC

## 2023-11-02 MED ORDER — METHOCARBAMOL 500 MG PO TABS
500.0000 mg | ORAL_TABLET | Freq: Once | ORAL | Status: AC
  Administered 2023-11-02: 500 mg via ORAL
  Filled 2023-11-02: qty 1

## 2023-11-02 MED ORDER — MORPHINE SULFATE (PF) 4 MG/ML IV SOLN
4.0000 mg | INTRAVENOUS | Status: DC | PRN
  Administered 2023-11-03 – 2023-11-04 (×3): 4 mg via INTRAVENOUS
  Filled 2023-11-02 (×3): qty 1

## 2023-11-02 MED ORDER — FENTANYL CITRATE PF 50 MCG/ML IJ SOSY
50.0000 ug | PREFILLED_SYRINGE | Freq: Once | INTRAMUSCULAR | Status: AC
  Administered 2023-11-02: 50 ug via INTRAVENOUS
  Filled 2023-11-02: qty 1

## 2023-11-02 MED ORDER — MORPHINE SULFATE (PF) 4 MG/ML IV SOLN
4.0000 mg | Freq: Once | INTRAVENOUS | Status: AC
  Administered 2023-11-02: 4 mg via INTRAVENOUS
  Filled 2023-11-02: qty 1

## 2023-11-02 MED ORDER — IOHEXOL 300 MG/ML  SOLN
100.0000 mL | Freq: Once | INTRAMUSCULAR | Status: AC | PRN
  Administered 2023-11-02: 100 mL via INTRAVENOUS

## 2023-11-02 MED ORDER — DIPHENHYDRAMINE HCL 25 MG PO CAPS
25.0000 mg | ORAL_CAPSULE | Freq: Every day | ORAL | Status: DC
  Administered 2023-11-02 – 2023-11-03 (×2): 25 mg via ORAL
  Filled 2023-11-02 (×2): qty 1

## 2023-11-02 MED ORDER — METHOCARBAMOL 1000 MG/10ML IJ SOLN
500.0000 mg | Freq: Three times a day (TID) | INTRAMUSCULAR | Status: AC
  Administered 2023-11-02 – 2023-11-03 (×3): 500 mg via INTRAVENOUS
  Filled 2023-11-02 (×3): qty 10

## 2023-11-02 MED ORDER — RIVAROXABAN 10 MG PO TABS
10.0000 mg | ORAL_TABLET | Freq: Every day | ORAL | Status: DC
Start: 2023-11-03 — End: 2023-11-04
  Administered 2023-11-03 – 2023-11-04 (×2): 10 mg via ORAL
  Filled 2023-11-02 (×2): qty 1

## 2023-11-02 MED ORDER — KETOROLAC TROMETHAMINE 15 MG/ML IJ SOLN
15.0000 mg | Freq: Once | INTRAMUSCULAR | Status: AC
  Administered 2023-11-02: 15 mg via INTRAVENOUS
  Filled 2023-11-02: qty 1

## 2023-11-02 MED ORDER — LIDOCAINE 5 % EX PTCH
1.0000 | MEDICATED_PATCH | CUTANEOUS | Status: DC
  Administered 2023-11-02 – 2023-11-03 (×2): 1 via TRANSDERMAL
  Filled 2023-11-02 (×2): qty 1

## 2023-11-02 NOTE — Assessment & Plan Note (Signed)
 The rest of patient's chronic medical conditions are stable.  Resume Norvasc 2.5 mg daily

## 2023-11-02 NOTE — ED Triage Notes (Signed)
 Pt arrived from home via [facility] c/o chronic lower back pain. Pt reports recent PT and pain has worsened. Pt reports calling her PCP but they would not be able to see her until Thursday.

## 2023-11-02 NOTE — ED Provider Notes (Signed)
 [place] EMERGENCY DEPARTMENT AT [facility] Provider Note   CSN: [id number] Arrival date & time: 11/02/23  1308     History  Chief Complaint  Patient presents with   Back Pain    Kayla Thomas is a 83 y.o. female.  History of migraines, uterine cancer and breast cancer both in remission, remote history of lumbar spine surgery approximately 20 years ago.  She presents to the ER today for evaluation of worsening right low back pain.  The pain started about 3 weeks ago.  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    She states she was standing up for the toilet and had some sharp low back pain that has gradually worsened over the past 3 weeks.  She has been going to physical therapy after being evaluated by her PCP. Today, however, she was unable to walk due to severe pain, pain radiates into the right leg.  She denies fever or chills, no saddle anesthesia or paresthesia, no bowel or bladder incontinence.  She denies weakness in the leg.  She has been taking Tylenol  and ibuprofen at home with very little relief.   Back Pain      Home Medications Prior to Admission medications   Medication Sig Start Date End Date Taking? Authorizing Provider  acetaminophen  (TYLENOL ) 325 MG tablet Take 650 mg by mouth every 6 (six) hours as needed (for pain.).    [provider]  amLODipine (NORVASC) 2.5 MG tablet Take 2.5 mg by mouth at bedtime. 02/21/22   [provider]  B Complex-C (SUPER B COMPLEX PO) Take 1 capsule by mouth daily.     [provider]  Calcium Carb-Cholecalciferol (CALCIUM 600/VITAMIN D3) 600-800 MG-UNIT TABS Take 1 capsule by mouth 2 (two) times daily.    [provider]  cetirizine (ZYRTEC) 10 MG tablet Take 10 mg by mouth daily. 12/10/22   [provider]  diclofenac sodium (VOLTAREN) 1 % GEL Apply 2-4 g topically 3 (three) times  daily as needed (for pain.).  01/19/15   [provider]  diphenhydrAMINE  (BENADRYL ) 25 mg capsule Take 25 mg by mouth at bedtime.    [provider]  fluticasone  (FLONASE ) 50 MCG/ACT nasal spray Place 2 sprays into both nostrils daily. 11/17/21   Leath-Warren, Belen Bowers, NP  Glucosamine-Chondroitin (COSAMIN DS PO) Take 1 tablet by mouth daily.    [provider]  hydrocortisone  2.5 % lotion Apply topically daily. For 1 week 05/09/21   Ethlyn Herd, MD  loperamide (IMODIUM) 2 MG capsule Take 2 mg by mouth 4 (four) times daily as needed for diarrhea or loose stools.     [provider]  meclizine  (ANTIVERT ) 25 MG tablet Take 25 mg by mouth 3 (three) times daily as needed. 02/28/22   [provider]  Multiple Vitamin (MULTIVITAMIN WITH MINERALS) TABS tablet Take 1 tablet by mouth daily.    [provider]  Multiple Vitamins-Minerals (PRESERVISION AREDS PO) Take 1 tablet by mouth daily.    [provider]  pantoprazole (PROTONIX) 40 MG tablet Take 40 mg by mouth every morning. 12/02/22   [provider]  Turmeric 500 MG CAPS Take 500 mg by mouth daily.    [provider]  Zinc Sulfate (ZINC 15 PO) Take 1 tablet by mouth daily.    [provider]      Allergies    Aspirin, Iron,  Sulfa antibiotics, Sulfasalazine, and Ancef  [cefazolin ]    Review of Systems   Review of Systems  Musculoskeletal:  Positive for back pain.    Physical Exam Updated Vital Signs BP (!) 145/76   Pulse 78   Temp 98.2 F (36.8 C) (Oral)   Resp 18   Ht 5\' 3"  (1.6 m)   Wt 83.8 kg   SpO2 97%   BMI 32.73 kg/m  Physical Exam Vitals and nursing note reviewed.  Constitutional:      General: She is not in acute distress.    Appearance: She is well-developed.  HENT:     Head: Normocephalic and atraumatic.  Eyes:     Conjunctiva/sclera: Conjunctivae normal.  Cardiovascular:     Rate and Rhythm: Normal rate and regular rhythm.      Heart sounds: No murmur heard. Pulmonary:     Effort: Pulmonary effort is normal. No respiratory distress.     Breath sounds: Normal breath sounds.  Abdominal:     Palpations: Abdomen is soft.     Tenderness: There is abdominal tenderness in the right upper quadrant. There is no guarding or rebound.  Musculoskeletal:        General: No swelling.     Cervical back: Neck supple.     Comments: Tenderness right lumbar paraspinous muscles with no soft tissue swelling, no overlying rash or erythema.  Positive right leg raise.  Significant pain with any range of motion of the right leg.  Patient is able to dorsiflex and plantarflex right ankle, she can flex Right knee.  She has normal sensation throughout her lower extremities bilaterally.  Distal pulses are intact.  Skin:    General: Skin is warm and dry.     Capillary Refill: Capillary refill takes less than 2 seconds.  Neurological:     Mental Status: She is alert.  Psychiatric:        Mood and Affect: Mood normal.     ED Results / Procedures / Treatments   Labs (all labs ordered are listed, but only abnormal results are displayed) Labs Reviewed  CBC WITH DIFFERENTIAL/PLATELET - Abnormal; Notable for the following components:      Result Value   WBC 10.7 (*)    Neutro Abs 8.7 (*)    All other components within normal limits  BASIC METABOLIC PANEL WITH GFR - Abnormal; Notable for the following components:   Sodium 134 (*)    Glucose, Bld 131 (*)    All other components within normal limits  URINALYSIS, ROUTINE W REFLEX MICROSCOPIC    EKG None  Radiology CT L-SPINE NO CHARGE Result Date: 11/02/2023 CLINICAL DATA:  Chronic low back pain. Recent physical therapy with worsening symptoms. EXAM: CT Lumbar Spine without contrast TECHNIQUE: Technique: Multiplanar CT images of the lumbar spine were reconstructed from contemporary CT of the Abdomen and Pelvis. RADIATION DOSE REDUCTION: This exam was performed according to the  departmental dose-optimization program which includes automated exposure control, adjustment of the mA and/or kV according to patient size and/or use of iterative reconstruction technique. CONTRAST:  No additional COMPARISON:  Radiographs 12/16/2010.  MRI 12/05/2010. FINDINGS: Segmentation: There are 5 lumbar type vertebral bodies. Alignment: Stable. Minimal degenerative anterolisthesis at L3-4 appears unchanged. Vertebrae: No evidence of acute lumbar spine fracture, pars defect or aggressive osseous lesion. Stable postsurgical changes from L4-5 PLIF with solid interbody fusion. Multilevel spondylosis. Paraspinal and other soft tissues: No acute paraspinal findings. Intra-abdominal findings are dictated separately. Disc levels: Multilevel spondylosis with disc space  narrowing, endplate osteophytes and facet hypertrophy. There is no significant resulting spinal stenosis or foraminal narrowing from T11-12 through L2-3, although the right foramen at L1-2 is mildly narrowed. There are adjacent segment changes at L3-4 with loss of disc height, disc bulging, facet and ligamentous hypertrophy. Resulting moderate multifactorial spinal stenosis with lateral recess narrowing and left greater than right foraminal narrowing. No significant spinal stenosis or foraminal narrowing at the fused L4-5 level. Chronic disc bulging, endplate osteophytes and bilateral facet hypertrophy at L5-S1 with mild chronic left foraminal narrowing. IMPRESSION: 1. No evidence of acute lumbar spine fracture or traumatic subluxation. 2. Stable postsurgical changes from L4-5 PLIF with solid interbody fusion. 3. Adjacent segment changes at L3-4 with resulting moderate multifactorial spinal stenosis, lateral recess narrowing and left greater than right foraminal narrowing. 4. Mild chronic left foraminal narrowing at L5-S1. Electronically Signed   By: Elmon Hagedorn M.D.   On: 11/02/2023 17:58   CT ABDOMEN PELVIS W CONTRAST Result Date:  11/02/2023 CLINICAL DATA:  Right lower quadrant pain.  Chronic low back pain. EXAM: CT ABDOMEN AND PELVIS WITH CONTRAST TECHNIQUE: Multidetector CT imaging of the abdomen and pelvis was performed using the standard protocol following bolus administration of intravenous contrast. RADIATION DOSE REDUCTION: This exam was performed according to the departmental dose-optimization program which includes automated exposure control, adjustment of the mA and/or kV according to patient size and/or use of iterative reconstruction technique. CONTRAST:  OMNIPAQUE  IOHEXOL  300 MG/ML  SOLN COMPARISON:  12/01/2005. FINDINGS: Lower chest: Faint pulmonary nodules measure up to 4 mm in the right lower lobe. Lung bases are otherwise clear. Heart is enlarged. No pericardial or pleural effusion. Atherosclerotic calcification of the aorta and coronary arteries. Distal esophagus is grossly unremarkable. Hepatobiliary: Liver is decreased in attenuation diffusely. There may be a small peripherally calcified cyst in the inferior right hepatic lobe. No specific follow-up necessary. Cholecystectomy. No unexpected biliary ductal dilatation. Pancreas: Negative. Spleen: Negative. Adrenals/Urinary Tract: Adrenal glands and kidneys are unremarkable. Ureters are decompressed. Bladder is grossly unremarkable. Stomach/Bowel: Stomach, small bowel and colon are unremarkable. Moderate stool burden. Appendix is not readily visualized. Vascular/Lymphatic: Atherosclerotic calcification of the aorta. No pathologically enlarged lymph nodes. Reproductive: No adnexal mass. Other: No free fluid.  Small periumbilical hernia contains fat. Musculoskeletal: Degenerative changes in the spine and hips. L4-5 posterior lumbar interbody fusion. IMPRESSION: 1. No findings to explain the patient's right lower quadrant pain other than the possibility of constipation. 2. Faint pulmonary nodules measure up to 4 mm in the right lower lobe. No follow-up needed if patient is  low-risk (and has no known or suspected primary neoplasm). Non-contrast chest CT can be considered in 12 months if patient is high-risk. This recommendation follows the consensus statement: Guidelines for Management of Incidental Pulmonary Nodules Detected on CT Images: From the Fleischner Society 2017; Radiology 2017; 284:228-243. Hepatic steatosis. 3. Aortic atherosclerosis (ICD10-I70.0). Coronary artery calcification. Electronically Signed   By: Shearon Denis M.D.   On: 11/02/2023 17:51    Procedures Procedures    Medications Ordered in ED Medications  lidocaine  (LIDODERM ) 5 % 1 patch (1 patch Transdermal Patch Applied 11/02/23 1726)  fentaNYL  (SUBLIMAZE ) injection 50 mcg (50 mcg Intravenous Given 11/02/23 1431)  morphine (PF) 4 MG/ML injection 4 mg (4 mg Intravenous Given 11/02/23 1529)  iohexol  (OMNIPAQUE ) 300 MG/ML solution 100 mL (100 mLs Intravenous Contrast Given 11/02/23 1632)  methylPREDNISolone  sodium succinate (SOLU-MEDROL ) 125 mg/2 mL injection 125 mg (125 mg Intravenous Given 11/02/23 1844)  methocarbamol  (ROBAXIN ) tablet  500 mg (500 mg Oral Given 11/02/23 1844)  gabapentin (NEURONTIN) capsule 100 mg (100 mg Oral Given 11/02/23 1844)  ketorolac  (TORADOL ) 15 MG/ML injection 15 mg (15 mg Intravenous Given 11/02/23 1844)    ED Course/ Medical Decision Making/ A&P                                 Medical Decision Making This patient presents to the ED for concern of back pain this involves an extensive number of treatment options, and is a complaint that carries with it a high risk of complications and morbidity.  The differential diagnosis includes sprain, strain, HNP, fracture, DDD, muscle spasm, cauda equina, epidural abscess or hematoma, malignancy, other   Co morbidities that complicate the patient evaluation  History of cancer   Additional history obtained:  Additional history obtained from EMR External records from outside source obtained and reviewed including ER  notes   Lab Tests:  I Ordered, and personally interpreted labs.  The pertinent results include: CBC-normal, BMP-normal, UA, normal   Imaging Studies ordered:  I ordered imaging studies including CT abdomen pelvis I independently visualized and interpreted imaging which showed no acute findings I agree with the radiologist interpretation   CT lumbar spine showed no acute fracture or traumatic subluxation, postsurgical changes at L4-L5, adjacent segment changes at L3-L4 with moderate multifactorial spinal stenosis lateral recess narrowing and left greater than right foraminal narrowing and mild chronic left foraminal narrowing at L5-S1   Problem List / ED Course / Critical interventions / Medication management  Patient presents with low back pain x 3 weeks is getting worse and today is so severe she cannot walk. Differential considered as above. Symptoms are likely due to generative changes causing radiculopathy. They have no high risk features including saddle anesthesia/paresthesia, bowel or bladder incontinence, urinary retention, fever, weight loss, history of cancer or immune suppression, or IV drug use. We discussed plan to treat symptoms with analgesics, muscle relaxers, steroids and gabapentin.  She was informed if she was able to ambulate well discharged home but if she cannot ambulate well discussed with hospitalist for possible admission.  Patient did get some relief with medications and was able to ambulate a very short distance to the bathroom but had trouble getting back from the bathroom, her son at bedside states her right leg almost gave out and she almost fell.  Patient lives alone, she and son do not feel comfortable with her going home.  Will contact hospitalist for admission for intractable low back pain.    I ordered medication including fentanyl  for pain Reevaluation of the patient after these medicines showed that the patient stayed the same Subsequently given morphine  with mild improvement, but still not able to ambulate, and given Toradol , gabapentin, methocarbamol , and Solu-Medrol  and then pain returned I have reviewed the patients home medicines and have made adjustments as needed   Consults: I consulted the hospitalist, Dr. Debroah Fanning discussed patient's presentation and findings, she is agreeable with admission for intractable back pain.  Amount and/or Complexity of Data Reviewed Labs: ordered. Radiology: ordered.  Risk Prescription drug management. Decision regarding hospitalization.           Final Clinical Impression(s) / ED Diagnoses Final diagnoses:  None    Rx / DC Orders ED Discharge Orders     None         Aimee Houseman, PA-C 11/02/23 2109  Merdis Stalling, MD 11/02/23 [id number]

## 2023-11-02 NOTE — H&P (Signed)
 History and Physical    Kayla Thomas ZOX:[id number] DOB: January 23, 1941 DOA: 11/02/2023  PCP: Artemisa Bile, MD   Patient coming from: Home  I have personally briefly reviewed patient's old medical records in [facility]  Chief Complaint: Back Pain  HPI: Kayla Thomas is a 83 y.o. female with medical history significant for chronic back pain, breast cancer uterine cancer, GERD. Patient presented to the ED with complaints of back pain.  Patient has chronic back pain, but she reports over the past 3 to 4 weeks it has progressively gotten worse, she saw her outpatient provider and was referred for physical therapy.  Reports worsening pain with physical therapy.  Today the pain was so severe she was unable to move.  She denies falls, she denies any movements of exercise that could have worsened her chronic back pain.  Pain is located in her low back, there is no radiation down her lower extremities.  She denies any urinary problems, she has chronic diarrhea and this is unchanged.  ED Course: Stable vitals.  CT L-spine-no acute fracture or traumatic subluxation, stable postsurgical changes to L4-L5 PLIF,moderate multifactorial spinal stenosis, recess narrowing and , foraminal narrowing. CT abdomen and pelvis done for abdominal pain without acute abnormalities, shows faint pulmonary nodules. Patient received fentanyl , morphine, Solu-Medrol  125 mg, Robaxin , lidocaine  patch, ketorolac , gabapentin still with persistent pain, patient unable to ambulate.  Review of Systems: As per HPI all other systems reviewed and negative.  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   Status post vitrectomy March 2021 revealed resolved VM traction   Wears partial dentures     Past Surgical History:  Procedure Laterality Date   ABDOMINAL HYSTERECTOMY     ABSCESS DRAINAGE  03-04-05   insect bite   CARDIAC CATHETERIZATION N/A 10/31/2014   Procedure: Right/Left Heart Cath and Coronary Angiography;  Surgeon: Millicent Ally, MD;  Location: [facility];  Service: Cardiovascular;  Laterality: N/A;   CATARACT EXTRACTION W/PHACO  10/06/2011   Procedure: CATARACT EXTRACTION PHACO AND INTRAOCULAR LENS PLACEMENT (IOC);  Surgeon: Clay Cummins, MD;  Location: [facility];  Service: Ophthalmology;  Laterality: Left;  CDE:  5.76   cataracts     CHOLECYSTECTOMY  11/1999   COLONOSCOPY  05/13/2011   Procedure: COLONOSCOPY;  Surgeon: Pauleen Borne, MD;  Location: [facility];  Service: Endoscopy;  Laterality: N/A;  8:30   COLONOSCOPY N/A 04/09/2017   Dr. Nolene Baumgarten: single tubular adenoma removed from the colon, redundant left colon, internal and external hemorrhoids, next colonoscopy in 10 to 15 years.   ESOPHAGOGASTRODUODENOSCOPY  01/21/06   mild antral erythema bx h-pylori/normal esophagus without evidence of mass or Barrett's/normal pylorus and duodenum   PARTIAL MASTECTOMY WITH NEEDLE LOCALIZATION AND AXILLARY SENTINEL LYMPH NODE BX Left 08/18/2012   Procedure: PARTIAL MASTECTOMY WITH NEEDLE LOCALIZATION AND AXILLARY SENTINEL LYMPH NODE BX;  Surgeon: Beau Bound, MD;  Location: [facility];  Service: General;  Laterality: Left;  Need Frozen Section/Sentinel Node Bx @ 8:00am/Needle Loc @ 9:00am  POLYPECTOMY  04/09/2017   Procedure: POLYPECTOMY;  Surgeon: Alyce Jubilee, MD;  Location: [facility];  Service: Endoscopy;;  Rectal   S/P Hysterectomy  1998   uterine ca   temperal/mandibular  1974   TONSILLECTOMY  1964   TRIGGER FINGER RELEASE Right 05/04/2014   Procedure: RELEASE TRIGGER FINGER/A-1 PULLEY RIGHT RING FINGER;  Surgeon:  Lyanne Sample, MD;  Location: [facility];  Service: Orthopedics;  Laterality: Right;     reports that she has never smoked. She has never used smokeless tobacco. She reports that she does not drink alcohol and does not use drugs.  Allergies  Allergen Reactions   Aspirin Other (See Comments)    REACTION: Abdominal pain    Iron Anaphylaxis    REACTION: abdominal pain    Sulfa Antibiotics Rash    Rash all over   Sulfasalazine Rash    Rash all over   Ancef  [Cefazolin ] Rash    Family History  Problem Relation Age of Onset   Liver cancer Father    Alcohol abuse Father    Hypertension Mother    COPD Mother    Diabetes Brother    Other Son        knee and back problems    Prior to Admission medications   Medication Sig Start Date End Date Taking? Authorizing Provider  acetaminophen  (TYLENOL ) 325 MG tablet Take 650 mg by mouth every 6 (six) hours as needed (for pain.).    [provider]  amLODipine (NORVASC) 2.5 MG tablet Take 2.5 mg by mouth at bedtime. 02/21/22   [provider]  B Complex-C (SUPER B COMPLEX PO) Take 1 capsule by mouth daily.     [provider]  Calcium Carb-Cholecalciferol (CALCIUM 600/VITAMIN D3) 600-800 MG-UNIT TABS Take 1 capsule by mouth 2 (two) times daily.    [provider]  cetirizine (ZYRTEC) 10 MG tablet Take 10 mg by mouth daily. 12/10/22   [provider]  diclofenac sodium (VOLTAREN) 1 % GEL Apply 2-4 g topically 3 (three) times daily as needed (for pain.).  01/19/15   [provider]  diphenhydrAMINE  (BENADRYL ) 25 mg capsule Take 25 mg by mouth at bedtime.    [provider]  fluticasone  (FLONASE ) 50 MCG/ACT nasal spray Place 2 sprays into both nostrils daily. 11/17/21   Leath-Warren, Belen Bowers, NP  Glucosamine-Chondroitin (COSAMIN DS PO) Take 1 tablet by mouth daily.    [provider]  hydrocortisone  2.5 % lotion Apply topically daily. For 1 week 05/09/21   Ethlyn Herd, MD  loperamide (IMODIUM) 2 MG capsule Take 2 mg by mouth 4 (four) times daily as needed for diarrhea or loose stools.     [provider]  meclizine  (ANTIVERT ) 25 MG tablet Take 25 mg by mouth 3 (three) times daily as needed. 02/28/22   [provider]  Multiple Vitamin (MULTIVITAMIN WITH MINERALS) TABS tablet Take 1 tablet by mouth daily.    [provider]  Multiple Vitamins-Minerals (PRESERVISION AREDS PO) Take 1 tablet by mouth daily.    [provider]  pantoprazole (PROTONIX) 40 MG tablet Take 40 mg by mouth every morning. 12/02/22   [provider]  Turmeric 500 MG CAPS Take 500 mg by mouth daily.    [provider]  Zinc Sulfate (ZINC 15 PO) Take 1 tablet by mouth daily.    [provider]    Physical Exam: Vitals:   11/02/23 1500 11/02/23 1600 11/02/23 1723 11/02/23 1900  BP: (!) 155/58 (!) 157/59  (!) 145/76  Pulse: 68 77 82 78  Resp:  18 18 18   Temp:   98.2 F (36.8 C)   TempSrc:   Oral   SpO2: 96% 97% 92% 97%  Weight:      Height:        Constitutional: NAD, calm, comfortable Vitals:   11/02/23 1500 11/02/23 1600 11/02/23 1723 11/02/23 1900  BP: (!) 155/58 (!) 157/59  (!) 145/76  Pulse: 68 77 82 78  Resp:  18 18 18   Temp:   98.2 F (36.8 C)   TempSrc:   Oral   SpO2: 96% 97% 92% 97%  Weight:      Height:       Eyes: PERRL, lids and conjunctivae normal ENMT: Mucous membranes are moist. Posterior pharynx clear of any exudate or lesions  Neck: normal, supple, no masses, no thyromegaly Respiratory: clear to auscultation bilaterally, no wheezing, no crackles. Normal respiratory effort. No accessory muscle use.  Cardiovascular: Regular rate and rhythm, no murmurs / rubs / gallops. No extremity edema. Extremities warm. Abdomen: no tenderness, no masses palpated. No hepatosplenomegaly. Bowel sounds positive.  Musculoskeletal: no clubbing / cyanosis. No joint deformity upper and lower extremities. Skin:  no rashes, lesions, ulcers. No induration Neurologic: No facial asymmetry, sensation intact globally, moving extremities spontaneously.  Psychiatric: Normal judgment and insight. Alert and oriented x 3. Normal mood.   Labs on Admission: I have personally reviewed following labs and imaging studies  CBC: Recent Labs  Lab 11/02/23 1525  WBC 10.7*  NEUTROABS 8.7*  HGB 13.9  HCT 41.4  MCV 93.7  PLT 239   Basic Metabolic Panel: Recent Labs  Lab 11/02/23 1525  NA 134*  K 4.6  CL 100  CO2 25  GLUCOSE 131*  BUN 20  CREATININE 0.73  CALCIUM 8.9   Urine analysis:    Component Value Date/Time   COLORURINE YELLOW 11/02/2023 1541   APPEARANCEUR CLEAR 11/02/2023 1541   LABSPEC 1.016 11/02/2023 1541   PHURINE 6.0 11/02/2023 1541   GLUCOSEU NEGATIVE 11/02/2023 1541   HGBUR NEGATIVE 11/02/2023 1541   BILIRUBINUR NEGATIVE 11/02/2023 1541   KETONESUR NEGATIVE 11/02/2023 1541   PROTEINUR NEGATIVE 11/02/2023 1541   NITRITE NEGATIVE 11/02/2023 1541   LEUKOCYTESUR NEGATIVE 11/02/2023 1541    Radiological Exams on Admission: CT L-SPINE NO CHARGE Result Date: 11/02/2023 CLINICAL DATA:  Chronic low back pain. Recent physical therapy with worsening symptoms. EXAM: CT Lumbar Spine without contrast TECHNIQUE: Technique: Multiplanar CT images of the lumbar spine were reconstructed from contemporary CT of the Abdomen and Pelvis. RADIATION DOSE REDUCTION: This exam was performed according to the departmental dose-optimization program which includes automated exposure control, adjustment of the mA and/or kV according to patient size and/or use of iterative reconstruction technique. CONTRAST:  No additional COMPARISON:  Radiographs 12/16/2010.  MRI 12/05/2010. FINDINGS: Segmentation: There are 5 lumbar type vertebral bodies. Alignment: Stable. Minimal degenerative anterolisthesis at L3-4 appears unchanged. Vertebrae: No evidence of acute lumbar spine fracture, pars defect or aggressive osseous lesion.  Stable postsurgical changes from L4-5 PLIF with solid interbody fusion. Multilevel spondylosis. Paraspinal and other soft tissues: No acute paraspinal findings. Intra-abdominal findings are dictated separately. Disc levels: Multilevel spondylosis with disc space narrowing, endplate osteophytes and facet hypertrophy. There is no significant resulting spinal stenosis or foraminal narrowing from T11-12 through L2-3, although the right foramen at L1-2 is mildly narrowed. There are adjacent segment changes at L3-4 with loss of disc height, disc bulging, facet  and ligamentous hypertrophy. Resulting moderate multifactorial spinal stenosis with lateral recess narrowing and left greater than right foraminal narrowing. No significant spinal stenosis or foraminal narrowing at the fused L4-5 level. Chronic disc bulging, endplate osteophytes and bilateral facet hypertrophy at L5-S1 with mild chronic left foraminal narrowing. IMPRESSION: 1. No evidence of acute lumbar spine fracture or traumatic subluxation. 2. Stable postsurgical changes from L4-5 PLIF with solid interbody fusion. 3. Adjacent segment changes at L3-4 with resulting moderate multifactorial spinal stenosis, lateral recess narrowing and left greater than right foraminal narrowing. 4. Mild chronic left foraminal narrowing at L5-S1. Electronically Signed   By: Elmon Hagedorn M.D.   On: 11/02/2023 17:58   CT ABDOMEN PELVIS W CONTRAST Result Date: 11/02/2023 CLINICAL DATA:  Right lower quadrant pain.  Chronic low back pain. EXAM: CT ABDOMEN AND PELVIS WITH CONTRAST TECHNIQUE: Multidetector CT imaging of the abdomen and pelvis was performed using the standard protocol following bolus administration of intravenous contrast. RADIATION DOSE REDUCTION: This exam was performed according to the departmental dose-optimization program which includes automated exposure control, adjustment of the mA and/or kV according to patient size and/or use of iterative reconstruction  technique. CONTRAST:  OMNIPAQUE  IOHEXOL  300 MG/ML  SOLN COMPARISON:  12/01/2005. FINDINGS: Lower chest: Faint pulmonary nodules measure up to 4 mm in the right lower lobe. Lung bases are otherwise clear. Heart is enlarged. No pericardial or pleural effusion. Atherosclerotic calcification of the aorta and coronary arteries. Distal esophagus is grossly unremarkable. Hepatobiliary: Liver is decreased in attenuation diffusely. There may be a small peripherally calcified cyst in the inferior right hepatic lobe. No specific follow-up necessary. Cholecystectomy. No unexpected biliary ductal dilatation. Pancreas: Negative. Spleen: Negative. Adrenals/Urinary Tract: Adrenal glands and kidneys are unremarkable. Ureters are decompressed. Bladder is grossly unremarkable. Stomach/Bowel: Stomach, small bowel and colon are unremarkable. Moderate stool burden. Appendix is not readily visualized. Vascular/Lymphatic: Atherosclerotic calcification of the aorta. No pathologically enlarged lymph nodes. Reproductive: No adnexal mass. Other: No free fluid.  Small periumbilical hernia contains fat. Musculoskeletal: Degenerative changes in the spine and hips. L4-5 posterior lumbar interbody fusion. IMPRESSION: 1. No findings to explain the patient's right lower quadrant pain other than the possibility of constipation. 2. Faint pulmonary nodules measure up to 4 mm in the right lower lobe. No follow-up needed if patient is low-risk (and has no known or suspected primary neoplasm). Non-contrast chest CT can be considered in 12 months if patient is high-risk. This recommendation follows the consensus statement: Guidelines for Management of Incidental Pulmonary Nodules Detected on CT Images: From the Fleischner Society 2017; Radiology 2017; 284:228-243. Hepatic steatosis. 3. Aortic atherosclerosis (ICD10-I70.0). Coronary artery calcification. Electronically Signed   By: Shearon Denis M.D.   On: 11/02/2023 17:51   YQM:VHQI.    Assessment/Plan Principal Problem:   Intractable back pain  Assessment and Plan: * Intractable back pain Acute on chronic low back pain.  Pain is nonradiating, and she is without new urinary or bowel problems. CT lumbar spine without acute fracture or traumatic subluxation.  She has had back surgery in the past.  She reports spinal injections have helped in the past. - 125 mg Solu-Medrol  given in ED - IV morphine 4 mg as needed -Methocarbamol  3 times daily - Follow up with Ortho spine or neurosurgery on discharge -PT eval  HTN (hypertension) The rest of patient's chronic medical conditions are stable.  Resume Norvasc 2.5 mg daily   DVT prophylaxis: Xarelto.  Unspecified allergy to pork products-Lovenox  and heparin .  Code Status: FULL-  Confirmed with patient at bedside Family Communication: SOn- Ernestina Headland at bedside Disposition Plan: ~ 1- 2 days Consults called:  None Admission status:  Obs Med surg    Author: Pati Bonine, MD 11/02/2023 10:49 PM  For on call review [email or web address].

## 2023-11-02 NOTE — Assessment & Plan Note (Addendum)
 Acute on chronic low back pain.  Pain is nonradiating, and she is without new urinary or bowel problems. CT lumbar spine without acute fracture or traumatic subluxation.  She has had back surgery in the past.  She reports spinal injections have helped in the past. - 125 mg Solu-Medrol  given in ED - IV morphine 4 mg as needed -Methocarbamol  3 times daily - Follow up with Ortho spine or neurosurgery on discharge -PT eval

## 2023-11-03 DIAGNOSIS — M549 Dorsalgia, unspecified: Secondary | ICD-10-CM | POA: Diagnosis not present

## 2023-11-03 DIAGNOSIS — I10 Essential (primary) hypertension: Secondary | ICD-10-CM | POA: Diagnosis not present

## 2023-11-03 MED ORDER — OXYCODONE HCL 5 MG PO TABS: 5.0000 mg | ORAL_TABLET | Freq: Four times a day (QID) | ORAL | Status: DC | PRN

## 2023-11-03 MED ORDER — DEXAMETHASONE 4 MG PO TABS
6.0000 mg | ORAL_TABLET | Freq: Two times a day (BID) | ORAL | Status: DC
  Administered 2023-11-03 – 2023-11-04 (×2): 6 mg via ORAL
  Filled 2023-11-03 (×2): qty 2

## 2023-11-03 MED ORDER — PANTOPRAZOLE SODIUM 40 MG PO TBEC
40.0000 mg | DELAYED_RELEASE_TABLET | Freq: Two times a day (BID) | ORAL | Status: DC
  Administered 2023-11-03 – 2023-11-04 (×2): 40 mg via ORAL
  Filled 2023-11-03 (×2): qty 1

## 2023-11-03 MED ORDER — METHOCARBAMOL 500 MG PO TABS: 500.0000 mg | ORAL_TABLET | Freq: Three times a day (TID) | ORAL | Status: DC | PRN

## 2023-11-03 MED ORDER — GABAPENTIN 100 MG PO CAPS
100.0000 mg | ORAL_CAPSULE | Freq: Two times a day (BID) | ORAL | Status: DC
  Administered 2023-11-03 – 2023-11-04 (×2): 100 mg via ORAL
  Filled 2023-11-03 (×2): qty 1

## 2023-11-03 NOTE — Progress Notes (Signed)
 Mobility Specialist Progress Note:    11/03/23 1213  Mobility  Activity Ambulated with assistance to bathroom  Level of Assistance Contact guard assist, steadying assist  Assistive Device None  Distance Ambulated (ft) 20 ft  Range of Motion/Exercises Active;All extremities  Activity Response Tolerated well  Mobility Referral Yes  Mobility visit 1 Mobility  Mobility Specialist Start Time (ACUTE ONLY) 1145  Mobility Specialist Stop Time (ACUTE ONLY) 1205  Mobility Specialist Time Calculation (min) (ACUTE ONLY) 20 min   Pt received in bed requesting assistance to bathroom. Required CGA to stand and ambulate with no AD. Tolerated well, stated the pain was returning. Returned pt supine, alarm on. Notified RN, all needs met.  Glinda Lapping Mobility Specialist Please contact via Special educational needs teacher or  Rehab office at [phone]

## 2023-11-03 NOTE — Progress Notes (Signed)
 Progress Note   Patient: Kayla Thomas YNW:[id number] DOB: 1941-01-19 DOA: 11/02/2023     0 DOS: the patient was seen and examined on 11/03/2023   Brief hospital admission narrative: As per H&P written by Dr. Quintella Buck on 11/02/2023 Kayla Thomas is a 83 y.o. female with medical history significant for chronic back pain, breast cancer uterine cancer, GERD. Patient presented to the ED with complaints of back pain.  Patient has chronic back pain, but she reports over the past 3 to 4 weeks it has progressively gotten worse, she saw her outpatient provider and was referred for physical therapy.  Reports worsening pain with physical therapy.  Today the pain was so severe she was unable to move.  She denies falls, she denies any movements of exercise that could have worsened her chronic back pain.  Pain is located in her low back, there is no radiation down her lower extremities.  She denies any urinary problems, she has chronic diarrhea and this is unchanged.   ED Course: Stable vitals.  CT L-spine-no acute fracture or traumatic subluxation, stable postsurgical changes to L4-L5 PLIF,moderate multifactorial spinal stenosis, recess narrowing and , foraminal narrowing. CT abdomen and pelvis done for abdominal pain without acute abnormalities, shows faint pulmonary nodules. Patient received fentanyl , morphine, Solu-Medrol  125 mg, Robaxin , lidocaine  patch, ketorolac , gabapentin still with persistent pain, patient unable to ambulate.  Assessment and plan Intractable back pain -Acute on chronic low back pain.  Pain is nonradiating, and she is without new urinary or bowel problems. CT lumbar spine without acute fracture or traumatic subluxation.  - Patient with history of back surgery or injection in the past. - Overall Decadron , Robaxin , Patanol, as needed morphine and oxycodone  will be provided - PT Evaluation requested - If patient failed to Improved consultation with neurosurgery and MRI will be  required.  HTN (hypertension) -Continue current antihypertensive agents  GERD -continue PPI  Class 1 obesity -Body mass index is 32.8 kg/m. -low calorie diet and increase physical activity discussed with patient.   Subjective:  Afebrile, no chest pain, no nausea, no vomiting; complaining of back pain.  Physical Exam: Vitals:   11/02/23 2326 11/03/23 0209 11/03/23 0518 11/03/23 1248  BP: (!) 179/69 (!) 148/69 125/60 (!) 147/70  Pulse:  92 82 87  Resp:  18 18 19   Temp:  98.1 F (36.7 C) 98.1 F (36.7 C) 98 F (36.7 C)  TempSrc:  Oral Oral Oral  SpO2:  95% 95% 96%  Weight:      Height:       General exam: Alert, awake, oriented x 3; in mild distress due to back pain. Respiratory system: Clear to auscultation. Respiratory effort normal.  Good saturation on room air Cardiovascular system:RRR. No rubs or gallops. Gastrointestinal system: Abdomen is obese, nondistended, soft and nontender. No organomegaly or masses felt. Normal bowel sounds heard. Central nervous system: No focal neurological deficits. Extremities: No cyanosis or clubbing. Skin: No petechiae Psychiatry: Judgement and insight appear normal. Mood & affect appropriate.    Data Reviewed: CBC: WBC 10.7, hemoglobin 13.9 and platelet count 239K Basic metabolic panel: Sodium 134, potassium 4.6, chloride 100, bicarb 25, BUN 20, creatinine 0.73 and GFR >60  Family Communication: Son at bedside.  Disposition: Status is: Observation The patient remains OBS appropriate and will d/c before 2 midnights.  Hopefully discharge home with home health services.  Time spent: 50 minutes  Author: Justina Oman, MD 11/03/2023 5:47 PM  For on call review [email or web address].

## 2023-11-03 NOTE — TOC CM/SW Note (Signed)
 Transition of Care [facility]) - Inpatient Brief Assessment   Patient Details  Name: Kayla Thomas MRN: [id number] Date of Birth: 04-Feb-1941  Transition of Care [facility]) CM/SW Contact:    Grandville Lax, LCSWA Phone Number: 11/03/2023, 8:22 AM   Clinical Narrative: Transition of Care Department [facility]) has reviewed patient and no TOC needs have been identified at this time. We will continue to monitor patient advancement through interdiciplinary progression rounds. If new patient transition needs arise, please place a TOC consult.  Transition of Care Asessment: Insurance and Status: Insurance coverage has been reviewed Patient has primary care physician: Yes Home environment has been reviewed: From home Prior level of function:: Independent Prior/Current Home Services: No current home services Social Drivers of Health Review: SDOH reviewed no interventions necessary Readmission risk has been reviewed: Yes Transition of care needs: no transition of care needs at this time

## 2023-11-03 NOTE — TOC Initial Note (Signed)
 Transition of Care [facility]) - Initial/Assessment Note    Patient Details  Name: Kayla Thomas MRN: [id number] Date of Birth: July 20, 1940  Transition of Care [facility]) CM/SW Contact:    Grandville Lax, LCSWA Phone Number: 11/03/2023, 11:13 AM  Clinical Narrative:                 CSW met with pt at bedside to complete assessment. Pt lives alone and is independent in completing ADLs. Pt is able to drive to appointments when needed. Pt has not had HH and does not use any DME in the home when ambulating. TOC to follow.   Expected Discharge Plan: Home/Self Care Barriers to Discharge: No Barriers Identified   Patient Goals and CMS Choice Patient states their goals for this hospitalization and ongoing recovery are:: return home CMS Medicare.gov Compare Post Acute Care list provided to:: Patient Choice offered to / list presented to : Patient      Expected Discharge Plan and Services In-house Referral: Clinical Social Work Discharge Planning Services: CM Consult   Living arrangements for the past 2 months: Single Family Home                                      Prior Living Arrangements/Services Living arrangements for the past 2 months: Single Family Home Lives with:: Self Patient language and need for interpreter reviewed:: Yes Do you feel safe going back to the place where you live?: Yes      Need for Family Participation in Patient Care: Yes (Comment) Care giver support system in place?: Yes (comment)   Criminal Activity/Legal Involvement Pertinent to Current Situation/Hospitalization: No - Comment as needed  Activities of Daily Living   ADL Screening (condition at time of admission) Independently performs ADLs?: Yes (appropriate for developmental age) Is the patient deaf or have difficulty hearing?: No Does the patient have difficulty seeing, even when wearing glasses/contacts?: No Does the patient have difficulty concentrating, remembering, or making decisions?:  No  Permission Sought/Granted                  Emotional Assessment Appearance:: Appears stated age Attitude/Demeanor/Rapport: Engaged Affect (typically observed): Accepting Orientation: : Oriented to Self, Oriented to  Time, Oriented to Place, Oriented to Situation Alcohol / Substance Use: Not Applicable Psych Involvement: No (comment)  Admission diagnosis:  Intractable back pain [M54.9] Intractable low back pain [M54.59] Patient Active Problem List   Diagnosis Date Noted   Intractable back pain 11/02/2023   HTN (hypertension) 11/02/2023   History of vitrectomy 12/05/2021   Intermediate stage nonexudative age-related macular degeneration of left eye 05/21/2021   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   Dry eyes, bilateral 05/03/2020   Follow-up examination after eye surgery 10/19/2019   Advanced nonexudative age-related macular degeneration of right eye with subfoveal involvement 10/19/2019   Polyp of rectum    Vulval lesion benign, excised 12/22/2016   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   Closed nondisplaced fracture of distal phalanx of right middle finger 02/27/2016   Breast cancer (HCC) 04/30/2015   Abnormal ECG    DOE (dyspnea on exertion) 04/12/2014   Migraine headache 01/23/2009   GERD 01/23/2009   Gastritis and gastroduodenitis 01/23/2009   LOOSE STOOLS 01/23/2009   Allergy 01/23/2009   UTERINE CANCER, HX OF 01/23/2009   PCP:  Artemisa Bile, MD Pharmacy:   [facility] - [place], [place] -  [address] [place] [address] Phone: [phone] Fax: [phone]     Social Drivers of Health (SDOH) Social History: SDOH Screenings   Food Insecurity: No Food Insecurity (11/02/2023)  Housing: Low Risk  (11/02/2023)  Transportation Needs: No Transportation Needs (11/02/2023)  Utilities: Not At Risk (11/02/2023)  Social Connections: Moderately Integrated (11/02/2023)  Tobacco Use: Low Risk   (11/02/2023)   SDOH Interventions:     Readmission Risk Interventions     No data to display

## 2023-11-03 NOTE — Plan of Care (Signed)

## 2023-11-03 NOTE — Care Management Obs Status (Signed)
 MEDICARE OBSERVATION STATUS NOTIFICATION   Patient Details  Name: Kayla Thomas MRN: [id number] Date of Birth: 1941-03-11   Medicare Observation Status Notification Given:  Yes    Geraldina Klinefelter, RN 11/03/2023, 6:43 PM

## 2023-11-04 ENCOUNTER — Encounter (HOSPITAL_COMMUNITY)

## 2023-11-04 DIAGNOSIS — M549 Dorsalgia, unspecified: Secondary | ICD-10-CM | POA: Diagnosis not present

## 2023-11-04 MED ORDER — GABAPENTIN 100 MG PO CAPS: 100.0000 mg | ORAL_CAPSULE | Freq: Two times a day (BID) | ORAL | 0 refills | Status: AC

## 2023-11-04 MED ORDER — DEXAMETHASONE 1 MG PO TABS: ORAL_TABLET | ORAL | 0 refills | Status: AC

## 2023-11-04 MED ORDER — METHOCARBAMOL 500 MG PO TABS: 500.0000 mg | ORAL_TABLET | Freq: Three times a day (TID) | ORAL | 0 refills | Status: AC | PRN

## 2023-11-04 MED ORDER — LIDOCAINE 5 % EX PTCH: 1.0000 | MEDICATED_PATCH | CUTANEOUS | 0 refills | Status: AC

## 2023-11-04 MED ORDER — OXYCODONE HCL 5 MG PO TABS: 5.0000 mg | ORAL_TABLET | Freq: Four times a day (QID) | ORAL | 0 refills | Status: AC | PRN

## 2023-11-04 NOTE — Plan of Care (Signed)
  Problem: Education: Goal: Knowledge of General Education information will improve Description: Including pain rating scale, medication(s)/side effects and non-pharmacologic comfort measures Outcome: Adequate for Discharge   Problem: Health Behavior/Discharge Planning: Goal: Ability to manage health-related needs will improve Outcome: Adequate for Discharge   Problem: Clinical Measurements: Goal: Ability to maintain clinical measurements within normal limits will improve Outcome: Adequate for Discharge Goal: Will remain free from infection Outcome: Adequate for Discharge Goal: Diagnostic test results will improve Outcome: Adequate for Discharge Goal: Respiratory complications will improve Outcome: Adequate for Discharge Goal: Cardiovascular complication will be avoided Outcome: Adequate for Discharge   Problem: Activity: Goal: Risk for activity intolerance will decrease Outcome: Adequate for Discharge   Problem: Nutrition: Goal: Adequate nutrition will be maintained Outcome: Adequate for Discharge   Problem: Coping: Goal: Level of anxiety will decrease Outcome: Adequate for Discharge   Problem: Elimination: Goal: Will not experience complications related to bowel motility Outcome: Adequate for Discharge Goal: Will not experience complications related to urinary retention Outcome: Adequate for Discharge   Problem: Pain Managment: Goal: General experience of comfort will improve and/or be controlled Outcome: Adequate for Discharge   Problem: Safety: Goal: Ability to remain free from injury will improve Outcome: Adequate for Discharge   Problem: Skin Integrity: Goal: Risk for impaired skin integrity will decrease Outcome: Adequate for Discharge   Problem: Acute Rehab PT Goals(only PT should resolve) Goal: Pt Will Go Supine/Side To Sit Outcome: Adequate for Discharge Goal: Patient Will Transfer Sit To/From Stand Outcome: Adequate for Discharge Goal: Pt Will  Transfer Bed To Chair/Chair To Bed Outcome: Adequate for Discharge Goal: Pt Will Ambulate Outcome: Adequate for Discharge

## 2023-11-04 NOTE — Discharge Summary (Signed)
 Physician Discharge Summary  Kayla Thomas JYN:[id number] DOB: 1940/07/24 DOA: 11/02/2023  PCP: Artemisa Bile, MD  Admit date: 11/02/2023  Discharge date: 11/04/2023  Admitted From:Home  Disposition:  Home  Recommendations for Outpatient Follow-up:  Follow up with PCP in 1-2 weeks Continue on pain medications as ordered Finish steroid taper as prescribed Continue other home medications as prior Could consider lumbar MRI as well as referral to neurosurgery outpatient if symptoms fail to improve  Home Health: Yes with PT/OT  Equipment/Devices: Rolling walker  Discharge Condition:Stable  CODE STATUS: Full  Diet recommendation: Heart Healthy  Brief/Interim Summary: Kayla Thomas is a 83 y.o. female with medical history significant for chronic back pain, breast cancer uterine cancer, GERD. Patient presented to the ED with complaints of back pain.  Patient has chronic back pain, but she reports over the past 3 to 4 weeks it has progressively gotten worse, she saw her outpatient provider and was referred for physical therapy.  Reports worsening pain with physical therapy.  Today the pain was so severe she was unable to move.  She was admitted with intractable low back pain and CT lumbar spine did not straight any acute fractures or traumatic subluxation.  No signs of cauda equina noted and PT evaluation was requested.  PT recommendations are for outpatient physical therapy, however patient cannot drive and therefore home health services will be arranged.  Pain levels are improved on the day of discharge and pain medications have been prescribed.  No other acute events or concerns noted throughout the course of this admission.  Discharge Diagnoses:  Principal Problem:   Intractable back pain Active Problems:   HTN (hypertension)  Principal discharge diagnosis: Acute on chronic low back pain.  Discharge Instructions  Discharge Instructions     Diet - low sodium heart healthy    Complete by: As directed    Increase activity slowly   Complete by: As directed       Allergies as of 11/04/2023       Reactions   Aspirin Other (See Comments)   Abdominal pain   Iron Anaphylaxis, Other (See Comments)   Abdominal pain   Sulfa Antibiotics Rash   Rash all over   Sulfasalazine Rash   Rash all over   Pork-derived Products Other (See Comments)   Against her religion   Shellfish Allergy Other (See Comments)   Against her religion   Ancef  [cefazolin ] Rash        Medication List     TAKE these medications    acetaminophen  325 MG tablet Commonly known as: TYLENOL  Take 650 mg by mouth every 6 (six) hours as needed (for pain.).   amLODipine 2.5 MG tablet Commonly known as: NORVASC Take 2.5 mg by mouth at bedtime.   Calcium 600/Vitamin D3 600-800 MG-UNIT Tabs Generic drug: Calcium Carb-Cholecalciferol Take 1 capsule by mouth 2 (two) times daily.   COSAMIN DS PO Take 1 tablet by mouth daily.   dexamethasone  1 MG tablet Commonly known as: DECADRON  Take 6 tablets (6 mg total) by mouth every 12 (twelve) hours for 1 day, THEN 5 tablets (5 mg total) every 12 (twelve) hours for 1 day, THEN 4 tablets (4 mg total) every 12 (twelve) hours for 1 day, THEN 3 tablets (3 mg total) every 12 (twelve) hours for 1 day, THEN 2 tablets (2 mg total) every 12 (twelve) hours for 1 day, THEN 1 tablet (1 mg total) every 12 (twelve) hours for 1 day. Start taking on: Nov 04, 2023   diclofenac sodium 1 % Gel Commonly known as: VOLTAREN Apply 2-4 g topically 3 (three) times daily as needed (for pain.).   diphenhydrAMINE  25 mg capsule Commonly known as: BENADRYL  Take 25-50 mg by mouth at bedtime.   fluticasone  50 MCG/ACT nasal spray Commonly known as: FLONASE  Place 2 sprays into both nostrils daily.   gabapentin 100 MG capsule Commonly known as: NEURONTIN Take 1 capsule (100 mg total) by mouth 2 (two) times daily.   ibuprofen 200 MG tablet Commonly known as: ADVIL Take  400 mg by mouth every 6 (six) hours as needed for mild pain (pain score 1-3).   ketoconazole 2 % cream Commonly known as: NIZORAL Apply 1 Application topically 2 (two) times daily as needed for irritation.   lidocaine  5 % Commonly known as: LIDODERM  Place 1 patch onto the skin daily. Remove & Discard patch within 12 hours or as directed by MD   loperamide 2 MG capsule Commonly known as: IMODIUM Take 2 mg by mouth 4 (four) times daily as needed for diarrhea or loose stools.   methocarbamol  500 MG tablet Commonly known as: ROBAXIN  Take 1 tablet (500 mg total) by mouth every 8 (eight) hours as needed for muscle spasms.   multivitamin with minerals Tabs tablet Take 1 tablet by mouth daily.   oxyCODONE  5 MG immediate release tablet Commonly known as: Oxy IR/ROXICODONE  Take 1 tablet (5 mg total) by mouth every 6 (six) hours as needed for moderate pain (pain score 4-6), severe pain (pain score 7-10) or breakthrough pain.   pantoprazole 40 MG tablet Commonly known as: PROTONIX Take 40 mg by mouth every morning.   PRESERVISION AREDS PO Take 2 tablets by mouth daily.   SUPER B COMPLEX PO Take 1 capsule by mouth daily.   Turmeric 500 MG Caps Take 500 mg by mouth daily.   ZINC 15 PO Take 1 tablet by mouth daily.               Durable Medical Equipment  (From admission, onward)           Start     Ordered   11/04/23 1146  For home use only DME Walker rolling  Once       Question Answer Comment  Walker: With 5 Inch Wheels   Patient needs a walker to treat with the following condition Weakness      11/04/23 1145            Follow-up Information     Artemisa Bile, MD. Schedule an appointment as soon as possible for a visit in 1 week(s).   Specialty: Internal Medicine Contact information: [address] [place] [address] [phone]                Allergies  Allergen Reactions   Aspirin Other (See Comments)    Abdominal pain    Iron  Anaphylaxis and Other (See Comments)    Abdominal pain    Sulfa Antibiotics Rash    Rash all over   Sulfasalazine Rash    Rash all over   Pork-Derived Products Other (See Comments)    Against her religion   Shellfish Allergy Other (See Comments)    Against her religion   Ancef  [Cefazolin ] Rash    Consultations: None   Procedures/Studies: CT L-SPINE NO CHARGE Result Date: 11/02/2023 CLINICAL DATA:  Chronic low back pain. Recent physical therapy with worsening symptoms. EXAM: CT Lumbar Spine without contrast TECHNIQUE: Technique: Multiplanar CT  images of the lumbar spine were reconstructed from contemporary CT of the Abdomen and Pelvis. RADIATION DOSE REDUCTION: This exam was performed according to the departmental dose-optimization program which includes automated exposure control, adjustment of the mA and/or kV according to patient size and/or use of iterative reconstruction technique. CONTRAST:  No additional COMPARISON:  Radiographs 12/16/2010.  MRI 12/05/2010. FINDINGS: Segmentation: There are 5 lumbar type vertebral bodies. Alignment: Stable. Minimal degenerative anterolisthesis at L3-4 appears unchanged. Vertebrae: No evidence of acute lumbar spine fracture, pars defect or aggressive osseous lesion. Stable postsurgical changes from L4-5 PLIF with solid interbody fusion. Multilevel spondylosis. Paraspinal and other soft tissues: No acute paraspinal findings. Intra-abdominal findings are dictated separately. Disc levels: Multilevel spondylosis with disc space narrowing, endplate osteophytes and facet hypertrophy. There is no significant resulting spinal stenosis or foraminal narrowing from T11-12 through L2-3, although the right foramen at L1-2 is mildly narrowed. There are adjacent segment changes at L3-4 with loss of disc height, disc bulging, facet and ligamentous hypertrophy. Resulting moderate multifactorial spinal stenosis with lateral recess narrowing and left greater than right  foraminal narrowing. No significant spinal stenosis or foraminal narrowing at the fused L4-5 level. Chronic disc bulging, endplate osteophytes and bilateral facet hypertrophy at L5-S1 with mild chronic left foraminal narrowing. IMPRESSION: 1. No evidence of acute lumbar spine fracture or traumatic subluxation. 2. Stable postsurgical changes from L4-5 PLIF with solid interbody fusion. 3. Adjacent segment changes at L3-4 with resulting moderate multifactorial spinal stenosis, lateral recess narrowing and left greater than right foraminal narrowing. 4. Mild chronic left foraminal narrowing at L5-S1. Electronically Signed   By: Elmon Hagedorn M.D.   On: 11/02/2023 17:58   CT ABDOMEN PELVIS W CONTRAST Result Date: 11/02/2023 CLINICAL DATA:  Right lower quadrant pain.  Chronic low back pain. EXAM: CT ABDOMEN AND PELVIS WITH CONTRAST TECHNIQUE: Multidetector CT imaging of the abdomen and pelvis was performed using the standard protocol following bolus administration of intravenous contrast. RADIATION DOSE REDUCTION: This exam was performed according to the departmental dose-optimization program which includes automated exposure control, adjustment of the mA and/or kV according to patient size and/or use of iterative reconstruction technique. CONTRAST:  OMNIPAQUE  IOHEXOL  300 MG/ML  SOLN COMPARISON:  12/01/2005. FINDINGS: Lower chest: Faint pulmonary nodules measure up to 4 mm in the right lower lobe. Lung bases are otherwise clear. Heart is enlarged. No pericardial or pleural effusion. Atherosclerotic calcification of the aorta and coronary arteries. Distal esophagus is grossly unremarkable. Hepatobiliary: Liver is decreased in attenuation diffusely. There may be a small peripherally calcified cyst in the inferior right hepatic lobe. No specific follow-up necessary. Cholecystectomy. No unexpected biliary ductal dilatation. Pancreas: Negative. Spleen: Negative. Adrenals/Urinary Tract: Adrenal glands and kidneys are  unremarkable. Ureters are decompressed. Bladder is grossly unremarkable. Stomach/Bowel: Stomach, small bowel and colon are unremarkable. Moderate stool burden. Appendix is not readily visualized. Vascular/Lymphatic: Atherosclerotic calcification of the aorta. No pathologically enlarged lymph nodes. Reproductive: No adnexal mass. Other: No free fluid.  Small periumbilical hernia contains fat. Musculoskeletal: Degenerative changes in the spine and hips. L4-5 posterior lumbar interbody fusion. IMPRESSION: 1. No findings to explain the patient's right lower quadrant pain other than the possibility of constipation. 2. Faint pulmonary nodules measure up to 4 mm in the right lower lobe. No follow-up needed if patient is low-risk (and has no known or suspected primary neoplasm). Non-contrast chest CT can be considered in 12 months if patient is high-risk. This recommendation follows the consensus statement: Guidelines for Management of Incidental Pulmonary  Nodules Detected on CT Images: From the Fleischner Society 2017; Radiology 2017; [phone]. Hepatic steatosis. 3. Aortic atherosclerosis (ICD10-I70.0). Coronary artery calcification. Electronically Signed   By: Shearon Denis M.D.   On: 11/02/2023 17:51     Discharge Exam: Vitals:   11/03/23 2108 11/04/23 0505  BP: (!) 140/56 135/64  Pulse: 74 69  Resp: 18 18  Temp: 98.2 F (36.8 C) 97.8 F (36.6 C)  SpO2: 97% 96%   Vitals:   11/03/23 0518 11/03/23 1248 11/03/23 2108 11/04/23 0505  BP: 125/60 (!) 147/70 (!) 140/56 135/64  Pulse: 82 87 74 69  Resp: 18 19 18 18   Temp: 98.1 F (36.7 C) 98 F (36.7 C) 98.2 F (36.8 C) 97.8 F (36.6 C)  TempSrc: Oral Oral Oral Oral  SpO2: 95% 96% 97% 96%  Weight:      Height:        General: Pt is alert, awake, not in acute distress Cardiovascular: RRR, S1/S2 +, no rubs, no gallops Respiratory: CTA bilaterally, no wheezing, no rhonchi Abdominal: Soft, NT, ND, bowel sounds + Extremities: no edema, no  cyanosis    The results of significant diagnostics from this hospitalization (including imaging, microbiology, ancillary and laboratory) are listed below for reference.     Microbiology: No results found for this or any previous visit (from the past 240 hours).   Labs: BNP (last 3 results) No results for input(s): "BNP" in the last 8760 hours. Basic Metabolic Panel: Recent Labs  Lab 11/02/23 1525  NA 134*  K 4.6  CL 100  CO2 25  GLUCOSE 131*  BUN 20  CREATININE 0.73  CALCIUM 8.9   Liver Function Tests: No results for input(s): "AST", "ALT", "ALKPHOS", "BILITOT", "PROT", "ALBUMIN" in the last 168 hours. No results for input(s): "LIPASE", "AMYLASE" in the last 168 hours. No results for input(s): "AMMONIA" in the last 168 hours. CBC: Recent Labs  Lab 11/02/23 1525  WBC 10.7*  NEUTROABS 8.7*  HGB 13.9  HCT 41.4  MCV 93.7  PLT 239   Cardiac Enzymes: No results for input(s): "CKTOTAL", "CKMB", "CKMBINDEX", "TROPONINI" in the last 168 hours. BNP: Invalid input(s): "POCBNP" CBG: No results for input(s): "GLUCAP" in the last 168 hours. D-Dimer No results for input(s): "DDIMER" in the last 72 hours. Hgb A1c No results for input(s): "HGBA1C" in the last 72 hours. Lipid Profile No results for input(s): "CHOL", "HDL", "LDLCALC", "TRIG", "CHOLHDL", "LDLDIRECT" in the last 72 hours. Thyroid  function studies No results for input(s): "TSH", "T4TOTAL", "T3FREE", "THYROIDAB" in the last 72 hours.  Invalid input(s): "FREET3" Anemia work up No results for input(s): "VITAMINB12", "FOLATE", "FERRITIN", "TIBC", "IRON", "RETICCTPCT" in the last 72 hours. Urinalysis    Component Value Date/Time   COLORURINE YELLOW 11/02/2023 1541   APPEARANCEUR CLEAR 11/02/2023 1541   LABSPEC 1.016 11/02/2023 1541   PHURINE 6.0 11/02/2023 1541   GLUCOSEU NEGATIVE 11/02/2023 1541   HGBUR NEGATIVE 11/02/2023 1541   BILIRUBINUR NEGATIVE 11/02/2023 1541   KETONESUR NEGATIVE 11/02/2023 1541    PROTEINUR NEGATIVE 11/02/2023 1541   NITRITE NEGATIVE 11/02/2023 1541   LEUKOCYTESUR NEGATIVE 11/02/2023 1541   Sepsis Labs Recent Labs  Lab 11/02/23 1525  WBC 10.7*   Microbiology No results found for this or any previous visit (from the past 240 hours).   Time coordinating discharge: 35 minutes  SIGNED:   Cornelius Dill, DO [facility] 11/04/2023, 12:08 PM  If 7PM-7AM, please contact night-coverage [email or web address]

## 2023-11-04 NOTE — Plan of Care (Signed)
  Problem: Acute Rehab PT Goals(only PT should resolve) Goal: Pt Will Go Supine/Side To Sit Outcome: Progressing Flowsheets (Taken 11/04/2023 1119) Pt will go Supine/Side to Sit:  with modified independence  with supervision Goal: Patient Will Transfer Sit To/From Stand Outcome: Progressing Flowsheets (Taken 11/04/2023 1119) Patient will transfer sit to/from stand: with modified independence Goal: Pt Will Transfer Bed To Chair/Chair To Bed Outcome: Progressing Flowsheets (Taken 11/04/2023 1119) Pt will Transfer Bed to Chair/Chair to Bed: with modified independence Goal: Pt Will Ambulate Outcome: Progressing Flowsheets (Taken 11/04/2023 1119) Pt will Ambulate:  > 125 feet  with modified independence  with rolling walker  with least restrictive assistive device   11:19 AM, 11/04/23 Walton Guppy, MPT Physical Therapist with [facility] [phone] office [phone] mobile phone

## 2023-11-04 NOTE — Evaluation (Signed)
 Physical Therapy Evaluation Patient Details Name: Kayla Thomas MRN: [id number] DOB: Nov 06, 1940 Today's Date: 11/04/2023  History of Present Illness  Kayla Thomas is a 83 y.o. female with medical history significant for chronic back pain, breast cancer uterine cancer, GERD.  Patient presented to the ED with complaints of back pain.  Patient has chronic back pain, but she reports over the past 3 to 4 weeks it has progressively gotten worse, she saw her outpatient provider and was referred for physical therapy.  Reports worsening pain with physical therapy.  Today the pain was so severe she was unable to move.  She denies falls, she denies any movements of exercise that could have worsened her chronic back pain.  Pain is located in her low back, there is no radiation down her lower extremities.  She denies any urinary problems, she has chronic diarrhea and this is unchanged.   Clinical Impression  Patient has most difficulty sitting up at bedside with fair return for rolling to side and sitting up from side lying position, can walk short distances without AD, but requires use for RW for longer distances due to increasing low back pain with good return for walking in hallway without loss of balance. Patient tolerated sitting up in chair after therapy. Patient will benefit from continued skilled physical therapy in hospital and recommended venue below to increase strength, balance, endurance for safe ADLs and gait.          If plan is discharge home, recommend the following: A little help with walking and/or transfers;Help with stairs or ramp for entrance;A little help with bathing/dressing/bathroom;Assistance with cooking/housework   Can travel by private vehicle        Equipment Recommendations None recommended by PT  Recommendations for Other Services       Functional Status Assessment Patient has had a recent decline in their functional status and demonstrates the ability to make  significant improvements in function in a reasonable and predictable amount of time.     Precautions / Restrictions Precautions Precautions: Fall Restrictions Weight Bearing Restrictions Per Provider Order: No      Mobility  Bed Mobility Overal bed mobility: Needs Assistance Bed Mobility: Rolling, Sidelying to Sit Rolling: Contact guard assist Sidelying to sit: Contact guard assist       General bed mobility comments: labored movement with c/o increased low back pain during side lying to sitting    Transfers Overall transfer level: Needs assistance Equipment used: Rolling walker (2 wheels), None Transfers: Sit to/from Stand, Bed to chair/wheelchair/BSC Sit to Stand: Supervision   Step pivot transfers: Supervision       General transfer comment: labored movement with good return for completing sit to stands, transfers without use of an AD    Ambulation/Gait Ambulation/Gait assistance: Supervision Gait Distance (Feet): 80 Feet Assistive device: Rolling walker (2 wheels), None Gait Pattern/deviations: Decreased step length - right, Decreased step length - left, Decreased stride length Gait velocity: decreased     General Gait Details: able to ambulate short distances without AD, but required use of RW for longer distances due to back pain/discomfort with good return for use demonstrated without loss of balance  Stairs            Wheelchair Mobility     Tilt Bed    Modified Rankin (Stroke Patients Only)       Balance Overall balance assessment: Needs assistance Sitting-balance support: Feet supported, No upper extremity supported Sitting balance-Leahy Scale: Good Sitting balance -  Comments: seated at EOB   Standing balance support: During functional activity, No upper extremity supported   Standing balance comment: fair/good using RW                             Pertinent Vitals/Pain Pain Assessment Pain Assessment: 0-10 Pain Score: 3   Pain Location: low back Pain Descriptors / Indicators: Sharp, Discomfort, Grimacing Pain Intervention(s): Limited activity within patient's tolerance, Monitored during session, Repositioned    Home Living Family/patient expects to be discharged to:: Private residence Living Arrangements: Alone Available Help at Discharge: Family;Available PRN/intermittently Type of Home: House Home Access: Ramped entrance       Home Layout: One level Home Equipment: Agricultural consultant (2 wheels);Shower seat;BSC/3in1      Prior Function Prior Level of Function : Independent/Modified Independent;Driving             Mobility Comments: Community ambulation without AD, drives, shops ADLs Comments: Independent     Extremity/Trunk Assessment   Upper Extremity Assessment Upper Extremity Assessment: Overall WFL for tasks assessed    Lower Extremity Assessment Lower Extremity Assessment: Generalized weakness    Cervical / Trunk Assessment Cervical / Trunk Assessment: Kyphotic  Communication   Communication Communication: No apparent difficulties    Cognition Arousal: Alert Behavior During Therapy: WFL for tasks assessed/performed   PT - Cognitive impairments: No apparent impairments                         Following commands: Intact       Cueing Cueing Techniques: Verbal cues, Tactile cues     General Comments      Exercises     Assessment/Plan    PT Assessment Patient needs continued PT services  PT Problem List Decreased strength;Decreased activity tolerance;Decreased balance;Decreased mobility       PT Treatment Interventions DME instruction;Gait training;Stair training;Functional mobility training;Therapeutic activities;Therapeutic exercise;Balance training;Patient/family education    PT Goals (Current goals can be found in the Care Plan section)  Acute Rehab PT Goals Patient Stated Goal: return home with family to assist PT Goal Formulation: With  patient Time For Goal Achievement: 11/07/23 Potential to Achieve Goals: Good    Frequency Min 3X/week     Co-evaluation               AM-PAC PT "6 Clicks" Mobility  Outcome Measure Help needed turning from your back to your side while in a flat bed without using bedrails?: A Little Help needed moving from lying on your back to sitting on the side of a flat bed without using bedrails?: A Little Help needed moving to and from a bed to a chair (including a wheelchair)?: None Help needed standing up from a chair using your arms (e.g., wheelchair or bedside chair)?: None Help needed to walk in hospital room?: A Little Help needed climbing 3-5 steps with a railing? : A Little 6 Click Score: 20    End of Session   Activity Tolerance: Patient tolerated treatment well;Patient limited by fatigue;Patient limited by pain Patient left: in chair;with call bell/phone within reach Nurse Communication: Mobility status PT Visit Diagnosis: Unsteadiness on feet (R26.81);Other abnormalities of gait and mobility (R26.89);Muscle weakness (generalized) (M62.81)    Time: 1610-9604 PT Time Calculation (min) (ACUTE ONLY): 25 min   Charges:   PT Evaluation $PT Eval Moderate Complexity: 1 Mod PT Treatments $Therapeutic Activity: 23-37 mins PT General Charges $$ ACUTE PT VISIT:  1 Visit         11:17 AM, 11/04/23 Walton Guppy, MPT Physical Therapist with [facility] [phone] office [phone] mobile phone

## 2023-11-04 NOTE — Plan of Care (Signed)

## 2023-11-04 NOTE — TOC Transition Note (Signed)
 Transition of Care [facility]) - Discharge Note   Patient Details  Name: Kayla Thomas MRN: [id number] Date of Birth: 1941/05/14  [facility] CM/SW Contact:  Grandville Lax, LCSWA Phone Number: 11/04/2023, 12:43 PM  Clinical Narrative:    CSW updated that PT is recommending OP PT for pt at D/C. CSW met with pt and son at bedside to review, pt does not drive at this time so prefers [facility] if able and does not have an agency preference. CSW spoke to Isa Manuel with [employer] who confirms they can accept [facility] PT/OT referral at this time. MD placed HH orders. CSW spoke with pt about need for walker, pt does not need this ordered at this time. TOC signing off.   Final next level of care: Home w Home Health Services Barriers to Discharge: Barriers Resolved   Patient Goals and CMS Choice Patient states their goals for this hospitalization and ongoing recovery are:: return home CMS Medicare.gov Compare Post Acute Care list provided to:: Patient Choice offered to / list presented to : Patient      Discharge Placement                       Discharge Plan and Services Additional resources added to the After Visit Summary for   In-house Referral: Clinical Social Work Discharge Planning Services: CM Consult                      HH Arranged: PT, OT HH Agency: [facility] Date [facility] Agency Contacted: 11/04/23   Representative spoke with at [facility] Agency: [facility]  Social Drivers of Health (SDOH) Interventions SDOH Screenings   Food Insecurity: No Food Insecurity (11/02/2023)  Housing: Low Risk  (11/02/2023)  Transportation Needs: No Transportation Needs (11/02/2023)  Utilities: Not At Risk (11/02/2023)  Social Connections: Moderately Integrated (11/02/2023)  Tobacco Use: Low Risk  (11/02/2023)     Readmission Risk Interventions     No data to display

## 2023-11-09 ENCOUNTER — Encounter (HOSPITAL_COMMUNITY)

## 2023-11-09 ENCOUNTER — Telehealth (HOSPITAL_COMMUNITY): Payer: Self-pay | Admitting: Pharmacy Technician

## 2023-11-09 NOTE — Telephone Encounter (Signed)
 Pharmacy Patient Advocate Encounter  Received notification from [facility] that Prior Authorization for Lidocaine  5% patches  has been APPROVED from 11/09/2023 to 11/08/2024   PA #/Case ID/Reference #: [id number]

## 2023-11-09 NOTE — Telephone Encounter (Signed)
 Pharmacy Patient Advocate Encounter   Received notification from Fax that prior authorization for Lidocaine  5% patches is required/requested.   Insurance verification completed.   The patient is insured through [facility] .   Per test claim: PA required; PA submitted to above mentioned insurance via CoverMyMeds Key/confirmation #/EOC [id number] Status is pending

## 2023-11-11 ENCOUNTER — Encounter (HOSPITAL_COMMUNITY)

## 2023-11-18 ENCOUNTER — Encounter (HOSPITAL_COMMUNITY)

## 2023-11-23 ENCOUNTER — Encounter (HOSPITAL_COMMUNITY): Admitting: Physical Therapy

## 2023-11-25 ENCOUNTER — Encounter (HOSPITAL_COMMUNITY)

## 2023-11-27 NOTE — Therapy (Addendum)
 PHYSICAL THERAPY DISCHARGE SUMMARY  Visits from Start of Care: 2  Patient agrees to discharge. Patient goals were not met. Patient is being discharged due to the physician's request.  Patient requests to cancel remaining appointments per MD scheduling patient to see neurosurgeon for ongoing pain. Patient discharged on this day.   10:31 AM, 11/27/23 Marysue Sola, PT, DPT  with [facility]

## 2023-11-30 ENCOUNTER — Encounter (HOSPITAL_COMMUNITY): Admitting: Physical Therapy

## 2023-12-02 ENCOUNTER — Encounter (HOSPITAL_COMMUNITY)

## 2023-12-07 ENCOUNTER — Encounter (HOSPITAL_COMMUNITY): Admitting: Physical Therapy

## 2023-12-09 ENCOUNTER — Encounter (HOSPITAL_COMMUNITY)

## 2024-03-01 ENCOUNTER — Other Ambulatory Visit: Payer: Self-pay | Admitting: *Deleted

## 2024-03-02 ENCOUNTER — Ambulatory Visit (HOSPITAL_COMMUNITY)
Admission: RE | Admit: 2024-03-02 | Discharge: 2024-03-02 | Disposition: A | Discharge status: Home / Self Care | Source: Ambulatory Visit | Attending: Hematology | Admitting: Hematology

## 2024-03-02 ENCOUNTER — Inpatient Hospital Stay: Attending: Physician Assistant

## 2024-03-02 ENCOUNTER — Encounter (HOSPITAL_COMMUNITY): Payer: Self-pay

## 2024-03-02 DIAGNOSIS — Z1231 Encounter for screening mammogram for malignant neoplasm of breast: Secondary | ICD-10-CM | POA: Insufficient documentation

## 2024-03-02 DIAGNOSIS — Z170 Estrogen receptor positive status [ER+]: Secondary | ICD-10-CM

## 2024-03-02 DIAGNOSIS — Z853 Personal history of malignant neoplasm of breast: Secondary | ICD-10-CM | POA: Insufficient documentation

## 2024-03-02 DIAGNOSIS — Z800 Family history of malignant neoplasm of digestive organs: Secondary | ICD-10-CM | POA: Diagnosis not present

## 2024-03-02 DIAGNOSIS — E559 Vitamin D deficiency, unspecified: Secondary | ICD-10-CM | POA: Diagnosis not present

## 2024-03-02 LAB — CBC
HCT: 44.4 % (ref 36.0–46.0)
Hemoglobin: 14.9 g/dL (ref 12.0–15.0)
MCH: 32.2 pg (ref 26.0–34.0)
MCHC: 33.6 g/dL (ref 30.0–36.0)
MCV: 95.9 fL (ref 80.0–100.0)
Platelets: 261 K/uL (ref 150–400)
RBC: 4.63 MIL/uL (ref 3.87–5.11)
RDW: 12.5 % (ref 11.5–15.5)
WBC: 6.6 K/uL (ref 4.0–10.5)
nRBC: 0 % (ref 0.0–0.2)

## 2024-03-02 LAB — COMPREHENSIVE METABOLIC PANEL WITH GFR
ALT: 20 U/L (ref 0–44)
AST: 21 U/L (ref 15–41)
Albumin: 4.2 g/dL (ref 3.5–5.0)
Alkaline Phosphatase: 80 U/L (ref 38–126)
Anion gap: 13 (ref 5–15)
BUN: 14 mg/dL (ref 8–23)
CO2: 24 mmol/L (ref 22–32)
Calcium: 8.9 mg/dL (ref 8.9–10.3)
Chloride: 101 mmol/L (ref 98–111)
Creatinine, Ser: 0.75 mg/dL (ref 0.44–1.00)
GFR, Estimated: >60 mL/min (ref >60)
Glucose, Bld: 165 mg/dL — ABNORMAL HIGH (ref 70–99)
Potassium: 4.2 mmol/L (ref 3.5–5.1)
Sodium: 138 mmol/L (ref 135–145)
Total Bilirubin: 0.7 mg/dL (ref 0.0–1.2)
Total Protein: 7.1 g/dL (ref 6.5–8.1)

## 2024-03-02 LAB — VITAMIN D 25 HYDROXY (VIT D DEFICIENCY, FRACTURES): Vit D, 25-Hydroxy: 25.5 ng/mL — ABNORMAL LOW (ref 30–100)

## 2024-03-03 ENCOUNTER — Inpatient Hospital Stay: Payer: Medicare HMO

## 2024-03-03 ENCOUNTER — Ambulatory Visit (HOSPITAL_COMMUNITY): Payer: Medicare HMO

## 2024-03-08 ENCOUNTER — Ambulatory Visit: Payer: Medicare HMO | Admitting: Hematology

## 2024-03-08 NOTE — Progress Notes (Deleted)
 [facility] [address], [place] [address]   CLINIC:  Medical Oncology/Hematology  PCP:  [name], MD [address] 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      CANCER STAGING: Cancer Staging  No matching staging information was found for the patient.   INTERVAL HISTORY:   [name], a 83 y.o. female, returns for routine follow-up of her history of left breast cancer. Kayla Thomas was last seen on by Dr. Rogers on 03/04/2023.   At today's visit, she  reports feeling ***.   ***She denies any recent hospitalizations, surgeries, or changes in her  baseline health status.  ***She denies any new breast lumps or axillary lymphadenopathy. ***No new onset bone pain, chest pain, dyspnea, or abdominal pain. ***She has no new headaches, seizures, or focal neurologic deficits. ***No B symptoms such as fever, chills, night sweats, unintentional weight loss.  She reports ***% energy and ***% appetite.  She is maintaining stable weight at this time.  ASSESSMENT & PLAN:  1.  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 Status post lumpectomy followed by adjuvant radiation. - Anastrozole  started in July 2014, switched to tamoxifen  in December 2016 completed in July 2019. - BCI testing in January 2019 with low likelihood of benefit with extended adjuvant therapy  -  Most recent mammogram (03/02/2024): BI-RADS Category 2, benign - Physical exam (03/10/2024): *** Left upper outer quadrant lumpectomy scar within normal limits, with no palpable masses or adenopathy. - Most recent labs (03/02/2024): Normal LFTs.  CBC normal. - No red flag symptoms per history today - PLAN: *** Discharge to PCP?  *** Should still continue annual mammograms and breast exams via PCP office.  2.  Bone health - Last DEXA scan on 12/12/2016 with T-score -1.0 - Most recent labs (03/02/2024): Vitamin D  is low at 25.50.  Normal calcium 8.9. - PLAN: *** Recommendations for vitamin D  and calcium supplements ***  PLAN SUMMARY: >> *** >> *** >> ***   REVIEW OF SYSTEMS: ***  Review of Systems - Oncology  PHYSICAL EXAM:   Performance status (ECOG): {CHL ONC ED:[id number]} *** There were no vitals filed for this visit. Wt Readings from Last 3 Encounters:  11/02/23 185 lb 3 oz (84 kg)  03/04/23 184 lb 12.8 oz (83.8 kg)  03/18/22 179 lb 1.6 oz (81.2 kg)   Physical Exam   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   Status post vitrectomy March 2021 revealed resolved VM  traction   Wears partial dentures    Past Surgical History:  Procedure Laterality Date   ABDOMINAL HYSTERECTOMY     ABSCESS DRAINAGE  03/04/2005   insect bite   BREAST LUMPECTOMY Left 2014   CARDIAC CATHETERIZATION N/A 10/31/2014   Procedure: Right/Left Heart Cath and Coronary Angiography;  Surgeon: Debby DELENA Sor, MD;  Location: [facility];  Service: Cardiovascular;  Laterality: N/A;   CATARACT EXTRACTION W/PHACO  10/06/2011    Procedure: CATARACT EXTRACTION PHACO AND INTRAOCULAR LENS PLACEMENT (IOC);  Surgeon: Dow JULIANNA Burke, MD;  Location: [facility];  Service: Ophthalmology;  Laterality: Left;  CDE:  5.76   cataracts     CHOLECYSTECTOMY  11/1999   COLONOSCOPY  05/13/2011   Procedure: COLONOSCOPY;  Surgeon: Margo CHRISTELLA Haddock, MD;  Location: [facility];  Service: Endoscopy;  Laterality: N/A;  8:30   COLONOSCOPY N/A 04/09/2017   Dr. Haddock: single tubular adenoma removed from the colon, redundant left colon, internal and external hemorrhoids, next colonoscopy in 10 to 15 years.   ESOPHAGOGASTRODUODENOSCOPY  01/21/2006   mild antral erythema bx h-pylori/normal esophagus without evidence of mass or Barrett's/normal pylorus and duodenum   PARTIAL MASTECTOMY WITH NEEDLE LOCALIZATION AND AXILLARY SENTINEL LYMPH NODE BX Left 08/18/2012   Procedure: PARTIAL MASTECTOMY WITH NEEDLE LOCALIZATION AND AXILLARY SENTINEL LYMPH NODE BX;  Surgeon: Oneil DELENA Budge, MD;  Location: [facility];  Service: General;  Laterality: Left;  Need Frozen Section/Sentinel Node Bx @ 8:00am/Needle Loc @ 9:00am   POLYPECTOMY  04/09/2017   Procedure: POLYPECTOMY;  Surgeon: Haddock Margo CROME, MD;  Location: [facility];  Service: Endoscopy;;  Rectal   S/P Hysterectomy  1998   uterine ca   temperal/mandibular  1974   TONSILLECTOMY  1964   TRIGGER FINGER RELEASE Right 05/04/2014   Procedure: RELEASE TRIGGER FINGER/A-1 PULLEY RIGHT RING FINGER;  Surgeon: Arley Curia, MD;  Location: [facility];  Service: Orthopedics;  Laterality: Right;    SOCIAL HISTORY:  Social History   Socioeconomic History   Marital status: Widowed    Spouse name: Not on file   Number of children: 1   Years of education: Not on file   Highest education level: Not on file  Occupational History   Occupation: retired; Dealer asst    Employer: RETIRED  Tobacco Use   Smoking status: Never   Smokeless tobacco: Never  Vaping Use   Vaping status: Never Used  Substance  and Sexual Activity   Alcohol use: No   Drug use: No   Sexual activity: Not Currently    Birth control/protection: Surgical    Comment: hyst  Other Topics Concern   Not on file  Social History Narrative   1 adopted son-grown   Lives alone   Social Drivers of Health   Financial Resource Strain: Not on file  Food Insecurity: No Food Insecurity (11/02/2023)   Hunger Vital Sign    Worried About Running Out of Food in the Last Year: Never true    Ran Out of Food in the Last Year: Never true  Transportation Needs: No Transportation Needs (11/02/2023)   PRAPARE - Administrator, Civil Service (Medical): No    Lack of Transportation (Non-Medical): No  Physical Activity: Not on file  Stress: Not on file  Social Connections: Moderately Integrated (11/02/2023)   Social Connection and Isolation Panel    Frequency of Communication with Friends and Family: More than three times a week    Frequency of  Social Gatherings with Friends and Family: More than three times a week    Attends Religious Services: More than 4 times per year    Active Member of [employer] or Organizations: Yes    Attends Banker Meetings: More than 4 times per year    Marital Status: Widowed  Intimate Partner Violence: Not At Risk (11/02/2023)   Humiliation, Afraid, Rape, and Kick questionnaire    Fear of Current or Ex-Partner: No    Emotionally Abused: No    Physically Abused: No    Sexually Abused: No    FAMILY HISTORY:  Family History  Problem Relation Age of Onset   Liver cancer Father    Alcohol abuse Father    Hypertension Mother    COPD Mother    Diabetes Brother    Other Son        knee and back problems    CURRENT MEDICATIONS:  Current Outpatient Medications  Medication Sig Dispense Refill   acetaminophen  (TYLENOL ) 325 MG tablet Take 650 mg by mouth every 6 (six) hours as needed (for pain.).     amLODipine  (NORVASC ) 2.5 MG tablet Take 2.5 mg by mouth at bedtime.     B Complex-C  (SUPER B COMPLEX PO) Take 1 capsule by mouth daily.      Calcium Carb-Cholecalciferol (CALCIUM 600/VITAMIN D3) 600-800 MG-UNIT TABS Take 1 capsule by mouth 2 (two) times daily.     diclofenac sodium (VOLTAREN) 1 % GEL Apply 2-4 g topically 3 (three) times daily as needed (for pain.).      diphenhydrAMINE  (BENADRYL ) 25 mg capsule Take 25-50 mg by mouth at bedtime.     fluticasone  (FLONASE ) 50 MCG/ACT nasal spray Place 2 sprays into both nostrils daily. 16 g 0   gabapentin  (NEURONTIN ) 100 MG capsule Take 1 capsule (100 mg total) by mouth 2 (two) times daily. 60 capsule 0   Glucosamine-Chondroitin (COSAMIN DS PO) Take 1 tablet by mouth daily.     ibuprofen  (ADVIL ) 200 MG tablet Take 400 mg by mouth every 6 (six) hours as needed for mild pain (pain score 1-3).     ketoconazole (NIZORAL) 2 % cream Apply 1 Application topically 2 (two) times daily as needed for irritation.     lidocaine  (LIDODERM ) 5 % Place 1 patch onto the skin daily. Remove & Discard patch within 12 hours or as directed by MD 30 patch 0   loperamide  (IMODIUM ) 2 MG capsule Take 2 mg by mouth 4 (four) times daily as needed for diarrhea or loose stools.      methocarbamol  (ROBAXIN ) 500 MG tablet Take 1 tablet (500 mg total) by mouth every 8 (eight) hours as needed for muscle spasms. 30 tablet 0   Multiple Vitamin (MULTIVITAMIN WITH MINERALS) TABS tablet Take 1 tablet by mouth daily.     Multiple Vitamins-Minerals (PRESERVISION AREDS PO) Take 2 tablets by mouth daily.     oxyCODONE  (OXY IR/ROXICODONE ) 5 MG immediate release tablet Take 1 tablet (5 mg total) by mouth every 6 (six) hours as needed for moderate pain (pain score 4-6), severe pain (pain score 7-10) or breakthrough pain. 20 tablet 0   pantoprazole  (PROTONIX ) 40 MG tablet Take 40 mg by mouth every morning.     Turmeric 500 MG CAPS Take 500 mg by mouth daily.     Zinc Sulfate (ZINC 15 PO) Take 1 tablet by mouth daily.     No current facility-administered medications for this  visit.    ALLERGIES:  Allergies  Allergen Reactions   Aspirin Other (See Comments)    Abdominal pain    Iron Anaphylaxis and Other (See Comments)    Abdominal pain    Sulfa Antibiotics Rash    Rash all over   Sulfasalazine Rash    Rash all over   Pork-Derived Products Other (See Comments)    Against her religion   Shellfish Allergy Other (See Comments)    Against her religion   Ancef  [Cefazolin ] Rash    LABORATORY DATA:  I have reviewed the labs as listed.     Latest Ref Rng & Units 03/02/2024    9:50 AM 11/02/2023    3:25 PM 02/24/2023   10:18 AM  CBC  WBC 4.0 - 10.5 K/uL 6.6  10.7  8.5   Hemoglobin 12.0 - 15.0 g/dL 85.0  86.0  85.7   Hematocrit 36.0 - 46.0 % 44.4  41.4  43.1   Platelets 150 - 400 K/uL 261  239  244       Latest Ref Rng & Units 03/02/2024    9:50 AM 11/02/2023    3:25 PM 02/24/2023   10:18 AM  CMP  Glucose 70 - 99 mg/dL 834  868  846   BUN 8 - 23 mg/dL 14  20  22    Creatinine 0.44 - 1.00 mg/dL 9.24  9.26  9.15   Sodium 135 - 145 mmol/L 138  134  137   Potassium 3.5 - 5.1 mmol/L 4.2  4.6  4.5   Chloride 98 - 111 mmol/L 101  100  100   CO2 22 - 32 mmol/L 24  25  27    Calcium 8.9 - 10.3 mg/dL 8.9  8.9  8.8   Total Protein 6.5 - 8.1 g/dL 7.1   6.8   Total Bilirubin 0.0 - 1.2 mg/dL 0.7   0.8   Alkaline Phos 38 - 126 U/L 80   72   AST 15 - 41 U/L 21   18   ALT 0 - 44 U/L 20   19     DIAGNOSTIC IMAGING:  I have independently reviewed the scans and discussed with the patient. MM 3D SCREENING MAMMOGRAM BILATERAL BREAST Result Date: 03/04/2024 CLINICAL DATA:  Screening. EXAM: DIGITAL SCREENING BILATERAL MAMMOGRAM WITH TOMOSYNTHESIS AND CAD TECHNIQUE: Bilateral screening digital craniocaudal and mediolateral oblique mammograms were obtained. Bilateral screening digital breast tomosynthesis was performed. The images were evaluated with computer-aided detection. COMPARISON:  Previous exam(s). ACR Breast Density Category b: There are scattered areas of  fibroglandular density. FINDINGS: Sequela of left breast lumpectomy performed February 2014. No findings suspicious for malignancy in either breast. IMPRESSION: No mammographic evidence of malignancy. A result letter of this screening mammogram will be mailed directly to the patient. RECOMMENDATION: Screening mammogram in one year. (Code:SM-B-01Y) BI-RADS CATEGORY  2: Benign. Electronically Signed   By: Curtistine Noble   On: 03/04/2024 14:12     WRAP UP:  All questions were answered. The patient knows to call the clinic with any problems, questions or concerns.  Medical decision making: ***  Time spent on visit: I spent {CHL ONC TIME VISIT - DTPQU:[id number]} counseling the patient face to face. The total time spent in the appointment was {CHL ONC TIME VISIT - DTPQU:[id number]} and more than 50% was on counseling.  Pleasant CHRISTELLA Barefoot, PA-C  ***

## 2024-03-10 ENCOUNTER — Inpatient Hospital Stay: Payer: Medicare HMO | Admitting: Physician Assistant

## 2024-03-14 NOTE — Progress Notes (Unsigned)
 [facility] [address], [place] [address]   CLINIC:  Medical Oncology/Hematology  PCP:  [name], MD [address] / [place] [address] [phone]   REASON FOR VISIT:  Follow-up for stage I invasive ductal carcinoma of the left breast (2014)  PRIOR THERAPY: - Anastrozole  from July 2014 to December 2016 - Tamoxifen  from December 2016 through July 2019  CURRENT THERAPY: Surveillance  BRIEF ONCOLOGIC HISTORY:   Oncology History  Breast cancer [facility])   Initial Diagnosis   Breast cancer (HCC)   07/13/2017 Miscellaneous   BCI results revealed LOW risk of late recurrence (3%) and LOW likelihood of benefit from extended anti-estrogen therapy.      CANCER STAGING: Cancer Staging  No matching staging information was found for the patient.   INTERVAL HISTORY:   [name], a 83 y.o. female, returns for routine follow-up of her history of left breast cancer. Kameryn was last seen on by Dr. Rogers on 03/04/2023.   At today's visit, she  reports feeling fairly well, apart form some issues with her back.   She denies any recent hospitalizations, surgeries, or changes in her  baseline health status.  She denies any new breast lumps or axillary lymphadenopathy. No new onset bone pain, chest pain, dyspnea, or abdominal pain. She has no new headaches, seizures, or focal neurologic deficits. No B symptoms such as fever, chills, night sweats, unintentional weight loss. She is taking Vitamin D  daily, unknown dose.  She reports 50% energy and 100% appetite.  She is maintaining stable weight at this time.  ASSESSMENT & PLAN:  1.  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 Status post lumpectomy followed by adjuvant radiation. - Anastrozole  started in July 2014, switched to tamoxifen  in December 2016 completed in July 2019. - BCI testing in  January 2019 with low likelihood of benefit with extended adjuvant therapy  - Most recent mammogram (03/02/2024): BI-RADS Category 2, benign - Physical exam (03/10/2024):  Left upper outer quadrant lumpectomy scar within normal limits.  There is some mild to moderate left breast edema on the lower portion of breast and surrounding the left nipple, which has been present since surgery/radiation per patient report.  There are no palpable masses or adenopathy in either breast. - Most recent labs (03/02/2024): Normal LFTs.  CBC normal. - No red flag symptoms per history today - PLAN: She is stable for discharge to PCP.  Should still continue annual mammograms and breast exams via PCP office.  2.  Bone health - Last DEXA scan on 12/12/2016 with T-score -1.0 - Most recent labs (03/02/2024): Vitamin D  is low at 25.50.  Normal calcium 8.9. - PLAN:  Recommendations for vitamin D  and calcium supplements   PLAN SUMMARY: >> Discharge to PCP    REVIEW OF SYSTEMS:   Review of Systems  Constitutional:  Positive for fatigue. Negative for appetite change, chills, diaphoresis, fever and unexpected weight change.  HENT:   Negative for lump/mass and nosebleeds.   Eyes:  Negative for eye problems.  Respiratory:  Negative for cough, hemoptysis and shortness of breath.   Cardiovascular:  Negative for chest pain, leg swelling and palpitations.  Gastrointestinal:  Positive for constipation and diarrhea. Negative for abdominal pain, blood in stool, nausea and vomiting.  Genitourinary:  Negative for hematuria.   Musculoskeletal:  Positive for back pain.  Skin: Negative.   Neurological:  Negative  for dizziness, headaches and light-headedness.  Hematological:  Does not bruise/bleed easily.    PHYSICAL EXAM:   Performance status (ECOG): 1 - Symptomatic but completely ambulatory  There were no vitals filed for this visit. Wt Readings from Last 3 Encounters:  11/02/23 185 lb 3 oz (84 kg)  03/04/23 184 lb 12.8 oz  (83.8 kg)  03/18/22 179 lb 1.6 oz (81.2 kg)   Physical Exam Constitutional:      Appearance: Normal appearance. She is obese.  Cardiovascular:     Heart sounds: Normal heart sounds.  Pulmonary:     Breath sounds: Normal breath sounds.  Chest:     Comments: Left upper outer quadrant lumpectomy scar within normal limits.  There is some mild to moderate left breast edema on the lower portion of breast and surrounding the left nipple, which has been present since surgery/radiation per patient report.  There are no palpable masses or adenopathy in either breast. Neurological:     General: No focal deficit present.     Mental Status: Mental status is at baseline.  Psychiatric:        Behavior: Behavior normal. Behavior is cooperative.      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   Status post vitrectomy March 2021 revealed resolved VM traction   Wears partial dentures    Past Surgical History:  Procedure Laterality Date   ABDOMINAL HYSTERECTOMY     ABSCESS DRAINAGE  03/04/2005   insect bite   BREAST LUMPECTOMY Left 2014   CARDIAC CATHETERIZATION N/A 10/31/2014   Procedure: Right/Left Heart Cath and Coronary Angiography;  Surgeon: Debby DELENA Sor, MD;  Location: [facility];  Service: Cardiovascular;  Laterality: N/A;   CATARACT EXTRACTION W/PHACO  10/06/2011   Procedure: CATARACT EXTRACTION PHACO AND INTRAOCULAR LENS PLACEMENT (IOC);  Surgeon: Dow JULIANNA Burke, MD;  Location: [facility];  Service: Ophthalmology;  Laterality: Left;  CDE:  5.76   cataracts     CHOLECYSTECTOMY  11/1999   COLONOSCOPY   05/13/2011   Procedure: COLONOSCOPY;  Surgeon: Margo CHRISTELLA Haddock, MD;  Location: [facility];  Service: Endoscopy;  Laterality: N/A;  8:30   COLONOSCOPY N/A 04/09/2017   Dr. Haddock: single tubular adenoma removed from the colon, redundant left colon, internal and external hemorrhoids, next colonoscopy in 10 to 15 years.   ESOPHAGOGASTRODUODENOSCOPY  01/21/2006   mild antral erythema bx h-pylori/normal esophagus without evidence of mass or Barrett's/normal pylorus and duodenum   PARTIAL MASTECTOMY WITH NEEDLE LOCALIZATION AND AXILLARY SENTINEL LYMPH NODE BX Left 08/18/2012   Procedure: PARTIAL MASTECTOMY WITH NEEDLE LOCALIZATION AND AXILLARY SENTINEL LYMPH NODE BX;  Surgeon: Oneil DELENA Budge, MD;  Location: [facility];  Service: General;  Laterality: Left;  Need Frozen Section/Sentinel Node Bx @ 8:00am/Needle Loc @ 9:00am   POLYPECTOMY  04/09/2017   Procedure: POLYPECTOMY;  Surgeon: Haddock Margo CROME, MD;  Location: [facility];  Service: Endoscopy;;  Rectal   S/P Hysterectomy  1998   uterine ca   temperal/mandibular  1974   TONSILLECTOMY  1964   TRIGGER FINGER  RELEASE Right 05/04/2014   Procedure: RELEASE TRIGGER FINGER/A-1 PULLEY RIGHT RING FINGER;  Surgeon: Arley Curia, MD;  Location: [facility];  Service: Orthopedics;  Laterality: Right;    SOCIAL HISTORY:  Social History   Socioeconomic History   Marital status: Widowed    Spouse name: Not on file   Number of children: 1   Years of education: Not on file   Highest education level: Not on file  Occupational History   Occupation: retired; Dealer asst    Employer: RETIRED  Tobacco Use   Smoking status: Never   Smokeless tobacco: Never  Vaping Use   Vaping status: Never Used  Substance and Sexual Activity   Alcohol use: No   Drug use: No   Sexual activity: Not Currently    Birth control/protection: Surgical    Comment: hyst  Other Topics Concern   Not on file  Social History Narrative   1 adopted son-grown   Lives  alone   Social Drivers of Health   Financial Resource Strain: Not on file  Food Insecurity: No Food Insecurity (11/02/2023)   Hunger Vital Sign    Worried About Running Out of Food in the Last Year: Never true    Ran Out of Food in the Last Year: Never true  Transportation Needs: No Transportation Needs (11/02/2023)   PRAPARE - Administrator, Civil Service (Medical): No    Lack of Transportation (Non-Medical): No  Physical Activity: Not on file  Stress: Not on file  Social Connections: Moderately Integrated (11/02/2023)   Social Connection and Isolation Panel    Frequency of Communication with Friends and Family: More than three times a week    Frequency of Social Gatherings with Friends and Family: More than three times a week    Attends Religious Services: More than 4 times per year    Active Member of [employer] or Organizations: Yes    Attends Banker Meetings: More than 4 times per year    Marital Status: Widowed  Intimate Partner Violence: Not At Risk (11/02/2023)   Humiliation, Afraid, Rape, and Kick questionnaire    Fear of Current or Ex-Partner: No    Emotionally Abused: No    Physically Abused: No    Sexually Abused: No    FAMILY HISTORY:  Family History  Problem Relation Age of Onset   Liver cancer Father    Alcohol abuse Father    Hypertension Mother    COPD Mother    Diabetes Brother    Other Son        knee and back problems    CURRENT MEDICATIONS:  Current Outpatient Medications  Medication Sig Dispense Refill   acetaminophen  (TYLENOL ) 325 MG tablet Take 650 mg by mouth every 6 (six) hours as needed (for pain.).     amLODipine  (NORVASC ) 2.5 MG tablet Take 2.5 mg by mouth at bedtime.     B Complex-C (SUPER B COMPLEX PO) Take 1 capsule by mouth daily.      Calcium Carb-Cholecalciferol (CALCIUM 600/VITAMIN D3) 600-800 MG-UNIT TABS Take 1 capsule by mouth 2 (two) times daily.     diclofenac sodium (VOLTAREN) 1 % GEL Apply 2-4 g topically 3  (three) times daily as needed (for pain.).      diphenhydrAMINE  (BENADRYL ) 25 mg capsule Take 25-50 mg by mouth at bedtime.     fluticasone  (FLONASE ) 50 MCG/ACT nasal spray Place 2 sprays into both nostrils daily. 16 g 0   gabapentin  (NEURONTIN )  100 MG capsule Take 1 capsule (100 mg total) by mouth 2 (two) times daily. 60 capsule 0   Glucosamine-Chondroitin (COSAMIN DS PO) Take 1 tablet by mouth daily.     ibuprofen  (ADVIL ) 200 MG tablet Take 400 mg by mouth every 6 (six) hours as needed for mild pain (pain score 1-3).     ketoconazole (NIZORAL) 2 % cream Apply 1 Application topically 2 (two) times daily as needed for irritation.     lidocaine  (LIDODERM ) 5 % Place 1 patch onto the skin daily. Remove & Discard patch within 12 hours or as directed by MD 30 patch 0   loperamide  (IMODIUM ) 2 MG capsule Take 2 mg by mouth 4 (four) times daily as needed for diarrhea or loose stools.      methocarbamol  (ROBAXIN ) 500 MG tablet Take 1 tablet (500 mg total) by mouth every 8 (eight) hours as needed for muscle spasms. 30 tablet 0   Multiple Vitamin (MULTIVITAMIN WITH MINERALS) TABS tablet Take 1 tablet by mouth daily.     Multiple Vitamins-Minerals (PRESERVISION AREDS PO) Take 2 tablets by mouth daily.     oxyCODONE  (OXY IR/ROXICODONE ) 5 MG immediate release tablet Take 1 tablet (5 mg total) by mouth every 6 (six) hours as needed for moderate pain (pain score 4-6), severe pain (pain score 7-10) or breakthrough pain. 20 tablet 0   pantoprazole  (PROTONIX ) 40 MG tablet Take 40 mg by mouth every morning.     Turmeric 500 MG CAPS Take 500 mg by mouth daily.     Zinc Sulfate (ZINC 15 PO) Take 1 tablet by mouth daily.     No current facility-administered medications for this visit.    ALLERGIES:  Allergies  Allergen Reactions   Aspirin Other (See Comments)    Abdominal pain    Iron Anaphylaxis and Other (See Comments)    Abdominal pain    Sulfa Antibiotics Rash    Rash all over   Sulfasalazine Rash     Rash all over   Pork-Derived Products Other (See Comments)    Against her religion   Shellfish Allergy Other (See Comments)    Against her religion   Ancef  [Cefazolin ] Rash    LABORATORY DATA:  I have reviewed the labs as listed.     Latest Ref Rng & Units 03/02/2024    9:50 AM 11/02/2023    3:25 PM 02/24/2023   10:18 AM  CBC  WBC 4.0 - 10.5 K/uL 6.6  10.7  8.5   Hemoglobin 12.0 - 15.0 g/dL 85.0  86.0  85.7   Hematocrit 36.0 - 46.0 % 44.4  41.4  43.1   Platelets 150 - 400 K/uL 261  239  244       Latest Ref Rng & Units 03/02/2024    9:50 AM 11/02/2023    3:25 PM 02/24/2023   10:18 AM  CMP  Glucose 70 - 99 mg/dL 834  868  846   BUN 8 - 23 mg/dL 14  20  22    Creatinine 0.44 - 1.00 mg/dL 9.24  9.26  9.15   Sodium 135 - 145 mmol/L 138  134  137   Potassium 3.5 - 5.1 mmol/L 4.2  4.6  4.5   Chloride 98 - 111 mmol/L 101  100  100   CO2 22 - 32 mmol/L 24  25  27    Calcium 8.9 - 10.3 mg/dL 8.9  8.9  8.8   Total Protein 6.5 - 8.1 g/dL 7.1   6.8  Total Bilirubin 0.0 - 1.2 mg/dL 0.7   0.8   Alkaline Phos 38 - 126 U/L 80   72   AST 15 - 41 U/L 21   18   ALT 0 - 44 U/L 20   19     DIAGNOSTIC IMAGING:  I have independently reviewed the scans and discussed with the patient. MM 3D SCREENING MAMMOGRAM BILATERAL BREAST Result Date: 03/04/2024 CLINICAL DATA:  Screening. EXAM: DIGITAL SCREENING BILATERAL MAMMOGRAM WITH TOMOSYNTHESIS AND CAD TECHNIQUE: Bilateral screening digital craniocaudal and mediolateral oblique mammograms were obtained. Bilateral screening digital breast tomosynthesis was performed. The images were evaluated with computer-aided detection. COMPARISON:  Previous exam(s). ACR Breast Density Category b: There are scattered areas of fibroglandular density. FINDINGS: Sequela of left breast lumpectomy performed February 2014. No findings suspicious for malignancy in either breast. IMPRESSION: No mammographic evidence of malignancy. A result letter of this screening mammogram will be  mailed directly to the patient. RECOMMENDATION: Screening mammogram in one year. (Code:SM-B-01Y) BI-RADS CATEGORY  2: Benign. Electronically Signed   By: Curtistine Noble   On: 03/04/2024 14:12     WRAP UP:  All questions were answered. The patient knows to call the clinic with any problems, questions or concerns.  Medical decision making: Moderate  Time spent on visit: I spent 20 minutes counseling the patient face to face. The total time spent in the appointment was 30 minutes and more than 50% was on counseling.  Pleasant CHRISTELLA Barefoot, PA-C  03/15/24 9:06 AM

## 2024-03-15 ENCOUNTER — Inpatient Hospital Stay (HOSPITAL_BASED_OUTPATIENT_CLINIC_OR_DEPARTMENT_OTHER): Admitting: Physician Assistant

## 2024-03-15 VITALS — BP 152/75 | HR 78 | Temp 98.2°F | Resp 20 | Wt 185.6 lb

## 2024-03-15 DIAGNOSIS — Z170 Estrogen receptor positive status [ER+]: Secondary | ICD-10-CM | POA: Diagnosis not present

## 2024-03-15 DIAGNOSIS — C50419 Malignant neoplasm of upper-outer quadrant of unspecified female breast: Secondary | ICD-10-CM | POA: Diagnosis not present

## 2024-03-15 DIAGNOSIS — Z853 Personal history of malignant neoplasm of breast: Secondary | ICD-10-CM | POA: Diagnosis not present

## 2024-03-15 NOTE — Patient Instructions (Signed)
 [facility] at [facility] **VISIT SUMMARY & IMPORTANT INSTRUCTIONS **   You were seen today by Pleasant Barefoot PA-C for your history of breast cancer.   Your most recent labs, mammogram, and physical exam did not show any evidence of recurrent breast cancer. Since you are more than 10 years since your diagnosis and treatment, you can discharge from the Cancer Center.  You will not need any further appointments here, but you should still continue to have mammogram and physical exam checked once a year by your primary care provider. Your vitamin D  levels were mildly low.  Please double the dose of your current over-the-counter vitamin D  supplement.  ** Thank you for trusting me with your healthcare!  I strive to provide all of my patients with quality care at each visit.  If you receive a survey for this visit, I would be so grateful to you for taking the time to provide feedback.  Thank you in advance!  ~ Adabelle Griffiths                                        Dr. Mickiel Davonna Pleasant Barefoot, PA-C          Delon Hope, NP   - - - - - - - - - - - - - - - - - -    Thank you for choosing  Cancer Center at [facility] to provide your oncology and hematology care.  To afford each patient quality time with our provider, please arrive at least 15 minutes before your scheduled appointment time.   If you have a lab appointment with the Cancer Center please come in thru the Main Entrance and check in at the main information desk.  You need to re-schedule your appointment should you arrive 10 or more minutes late.  We strive to give you quality time with our providers, and arriving late affects you and other patients whose appointments are after yours.  Also, if you no show three or more times for appointments you may be dismissed from the clinic at the providers discretion.     Again, thank you for choosing [facility].  Our hope is that  these requests will decrease the amount of time that you wait before being seen by our physicians.       _____________________________________________________________  Should you have questions after your visit to [facility], please contact our office at [phone] and follow the prompts.  Our office hours are 8:00 a.m. and 4:30 p.m. Monday - Friday.  Please note that voicemails left after 4:00 p.m. may not be returned until the following business day.  We are closed weekends and major holidays.  You do have access to a nurse 24-7, just call the main number to the clinic [phone] and do not press any options, hold on the line and a nurse will answer the phone.    For prescription refill requests, have your pharmacy contact our office and allow 72 hours.

## 2024-04-14 ENCOUNTER — Other Ambulatory Visit (HOSPITAL_COMMUNITY): Payer: Self-pay | Admitting: Neurosurgery

## 2024-04-14 DIAGNOSIS — M48061 Spinal stenosis, lumbar region without neurogenic claudication: Secondary | ICD-10-CM

## 2024-04-17 ENCOUNTER — Ambulatory Visit (HOSPITAL_COMMUNITY): Admission: RE | Admit: 2024-04-17 | Source: Ambulatory Visit

## 2024-04-18 ENCOUNTER — Ambulatory Visit (HOSPITAL_COMMUNITY)
Admission: RE | Admit: 2024-04-18 | Discharge: 2024-04-18 | Disposition: A | Discharge status: Home / Self Care | Source: Ambulatory Visit | Attending: Neurosurgery | Admitting: Neurosurgery

## 2024-04-18 DIAGNOSIS — M48061 Spinal stenosis, lumbar region without neurogenic claudication: Secondary | ICD-10-CM | POA: Diagnosis present
